# Patient Record
Sex: Female | Born: 1940 | Race: White | Hispanic: No | Marital: Single | Smoking: Never smoker
Health system: Southern US, Community
[De-identification: ages and names within clinical notes are randomized; demographics above are authoritative.]

## PROBLEM LIST (undated history)

## (undated) DIAGNOSIS — G629 Polyneuropathy, unspecified: Secondary | ICD-10-CM

## (undated) DIAGNOSIS — R112 Nausea with vomiting, unspecified: Secondary | ICD-10-CM

## (undated) DIAGNOSIS — J45909 Unspecified asthma, uncomplicated: Secondary | ICD-10-CM

## (undated) DIAGNOSIS — I82409 Acute embolism and thrombosis of unspecified deep veins of unspecified lower extremity: Secondary | ICD-10-CM

## (undated) DIAGNOSIS — G4730 Sleep apnea, unspecified: Secondary | ICD-10-CM

## (undated) DIAGNOSIS — I10 Essential (primary) hypertension: Secondary | ICD-10-CM

## (undated) DIAGNOSIS — Z8489 Family history of other specified conditions: Secondary | ICD-10-CM

## (undated) DIAGNOSIS — J4489 Other specified chronic obstructive pulmonary disease: Secondary | ICD-10-CM

## (undated) DIAGNOSIS — R42 Dizziness and giddiness: Secondary | ICD-10-CM

## (undated) DIAGNOSIS — I509 Heart failure, unspecified: Secondary | ICD-10-CM

## (undated) DIAGNOSIS — J449 Chronic obstructive pulmonary disease, unspecified: Secondary | ICD-10-CM

## (undated) DIAGNOSIS — K219 Gastro-esophageal reflux disease without esophagitis: Secondary | ICD-10-CM

## (undated) DIAGNOSIS — E079 Disorder of thyroid, unspecified: Secondary | ICD-10-CM

## (undated) DIAGNOSIS — E119 Type 2 diabetes mellitus without complications: Secondary | ICD-10-CM

## (undated) DIAGNOSIS — Z9889 Other specified postprocedural states: Secondary | ICD-10-CM

## (undated) HISTORY — PX: CARDIAC SURGERY: SHX584

## (undated) HISTORY — PX: VERTICAL BANDED GASTROPLASTY: SHX1102

## (undated) HISTORY — PX: HERNIA REPAIR: SHX51

---

## 2015-12-15 DIAGNOSIS — E039 Hypothyroidism, unspecified: Secondary | ICD-10-CM | POA: Insufficient documentation

## 2015-12-15 DIAGNOSIS — I10 Essential (primary) hypertension: Secondary | ICD-10-CM | POA: Insufficient documentation

## 2016-05-12 ENCOUNTER — Emergency Department: Payer: Medicare Other

## 2016-05-12 ENCOUNTER — Encounter: Payer: Self-pay | Admitting: Emergency Medicine

## 2016-05-12 ENCOUNTER — Inpatient Hospital Stay
Admission: EM | Admit: 2016-05-12 | Discharge: 2016-05-14 | DRG: 872 | Disposition: A | Discharge status: Home / Self Care | Payer: Medicare Other | Attending: Internal Medicine | Admitting: Internal Medicine

## 2016-05-12 DIAGNOSIS — Z952 Presence of prosthetic heart valve: Secondary | ICD-10-CM | POA: Diagnosis not present

## 2016-05-12 DIAGNOSIS — E1165 Type 2 diabetes mellitus with hyperglycemia: Secondary | ICD-10-CM | POA: Diagnosis not present

## 2016-05-12 DIAGNOSIS — M6281 Muscle weakness (generalized): Secondary | ICD-10-CM

## 2016-05-12 DIAGNOSIS — Z885 Allergy status to narcotic agent status: Secondary | ICD-10-CM | POA: Diagnosis not present

## 2016-05-12 DIAGNOSIS — J45901 Unspecified asthma with (acute) exacerbation: Secondary | ICD-10-CM | POA: Diagnosis present

## 2016-05-12 DIAGNOSIS — R079 Chest pain, unspecified: Secondary | ICD-10-CM | POA: Diagnosis not present

## 2016-05-12 DIAGNOSIS — A419 Sepsis, unspecified organism: Principal | ICD-10-CM | POA: Diagnosis present

## 2016-05-12 DIAGNOSIS — Z881 Allergy status to other antibiotic agents status: Secondary | ICD-10-CM

## 2016-05-12 DIAGNOSIS — J440 Chronic obstructive pulmonary disease with acute lower respiratory infection: Secondary | ICD-10-CM | POA: Diagnosis present

## 2016-05-12 DIAGNOSIS — T380X5A Adverse effect of glucocorticoids and synthetic analogues, initial encounter: Secondary | ICD-10-CM | POA: Diagnosis not present

## 2016-05-12 DIAGNOSIS — G4730 Sleep apnea, unspecified: Secondary | ICD-10-CM | POA: Diagnosis present

## 2016-05-12 DIAGNOSIS — Y92230 Patient room in hospital as the place of occurrence of the external cause: Secondary | ICD-10-CM | POA: Diagnosis not present

## 2016-05-12 DIAGNOSIS — E079 Disorder of thyroid, unspecified: Secondary | ICD-10-CM | POA: Diagnosis present

## 2016-05-12 DIAGNOSIS — J189 Pneumonia, unspecified organism: Secondary | ICD-10-CM

## 2016-05-12 DIAGNOSIS — E871 Hypo-osmolality and hyponatremia: Secondary | ICD-10-CM | POA: Diagnosis present

## 2016-05-12 DIAGNOSIS — Z882 Allergy status to sulfonamides status: Secondary | ICD-10-CM

## 2016-05-12 DIAGNOSIS — J441 Chronic obstructive pulmonary disease with (acute) exacerbation: Secondary | ICD-10-CM

## 2016-05-12 DIAGNOSIS — I10 Essential (primary) hypertension: Secondary | ICD-10-CM | POA: Diagnosis present

## 2016-05-12 DIAGNOSIS — E669 Obesity, unspecified: Secondary | ICD-10-CM | POA: Diagnosis present

## 2016-05-12 DIAGNOSIS — Z6835 Body mass index (BMI) 35.0-35.9, adult: Secondary | ICD-10-CM

## 2016-05-12 DIAGNOSIS — Z886 Allergy status to analgesic agent status: Secondary | ICD-10-CM | POA: Diagnosis not present

## 2016-05-12 DIAGNOSIS — D72829 Elevated white blood cell count, unspecified: Secondary | ICD-10-CM

## 2016-05-12 DIAGNOSIS — Z888 Allergy status to other drugs, medicaments and biological substances status: Secondary | ICD-10-CM | POA: Diagnosis not present

## 2016-05-12 DIAGNOSIS — E119 Type 2 diabetes mellitus without complications: Secondary | ICD-10-CM | POA: Diagnosis present

## 2016-05-12 DIAGNOSIS — R262 Difficulty in walking, not elsewhere classified: Secondary | ICD-10-CM

## 2016-05-12 DIAGNOSIS — R531 Weakness: Secondary | ICD-10-CM

## 2016-05-12 DIAGNOSIS — J209 Acute bronchitis, unspecified: Secondary | ICD-10-CM | POA: Diagnosis present

## 2016-05-12 HISTORY — DX: Type 2 diabetes mellitus without complications: E11.9

## 2016-05-12 HISTORY — DX: Disorder of thyroid, unspecified: E07.9

## 2016-05-12 HISTORY — DX: Essential (primary) hypertension: I10

## 2016-05-12 HISTORY — DX: Unspecified asthma, uncomplicated: J45.909

## 2016-05-12 LAB — COMPREHENSIVE METABOLIC PANEL
ALBUMIN: 4.2 g/dL (ref 3.5–5.0)
ALK PHOS: 84 U/L (ref 38–126)
ALT: 20 U/L (ref 14–54)
ANION GAP: 10 (ref 5–15)
AST: 34 U/L (ref 15–41)
BUN: 23 mg/dL — ABNORMAL HIGH (ref 6–20)
CO2: 25 mmol/L (ref 22–32)
Calcium: 9.2 mg/dL (ref 8.9–10.3)
Chloride: 97 mmol/L — ABNORMAL LOW (ref 101–111)
Creatinine, Ser: 0.92 mg/dL (ref 0.44–1.00)
GFR calc Af Amer: >60 mL/min (ref >60)
GFR calc non Af Amer: 60 mL/min — ABNORMAL LOW (ref >60)
GLUCOSE: 123 mg/dL — AB (ref 65–99)
POTASSIUM: 4 mmol/L (ref 3.5–5.1)
SODIUM: 132 mmol/L — AB (ref 135–145)
Total Bilirubin: 0.9 mg/dL (ref 0.3–1.2)
Total Protein: 7.4 g/dL (ref 6.5–8.1)

## 2016-05-12 LAB — CBC WITH DIFFERENTIAL/PLATELET
BASOS ABS: 0 10*3/uL (ref 0–0.1)
BASOS PCT: 0 %
EOS ABS: 0 10*3/uL (ref 0–0.7)
Eosinophils Relative: 0 %
HCT: 41.4 % (ref 35.0–47.0)
HEMOGLOBIN: 14.2 g/dL (ref 12.0–16.0)
Lymphocytes Relative: 8 %
Lymphs Abs: 1.5 10*3/uL (ref 1.0–3.6)
MCH: 30.4 pg (ref 26.0–34.0)
MCHC: 34.4 g/dL (ref 32.0–36.0)
MCV: 88.5 fL (ref 80.0–100.0)
MONOS PCT: 10 %
Monocytes Absolute: 1.7 10*3/uL — ABNORMAL HIGH (ref 0.2–0.9)
NEUTROS PCT: 82 %
Neutro Abs: 15 10*3/uL — ABNORMAL HIGH (ref 1.4–6.5)
Platelets: 191 10*3/uL (ref 150–440)
RBC: 4.68 MIL/uL (ref 3.80–5.20)
RDW: 13.7 % (ref 11.5–14.5)
WBC: 18.3 10*3/uL — ABNORMAL HIGH (ref 3.6–11.0)

## 2016-05-12 LAB — GLUCOSE, CAPILLARY: GLUCOSE-CAPILLARY: 302 mg/dL — AB (ref 65–99)

## 2016-05-12 LAB — TROPONIN I: Troponin I: <0.03 ng/mL (ref <0.03)

## 2016-05-12 LAB — PROTIME-INR
INR: 2.72
Prothrombin Time: 29.4 seconds — ABNORMAL HIGH (ref 11.4–15.2)

## 2016-05-12 LAB — BRAIN NATRIURETIC PEPTIDE: B Natriuretic Peptide: 113 pg/mL — ABNORMAL HIGH (ref 0.0–100.0)

## 2016-05-12 MED ORDER — INSULIN GLARGINE 100 UNIT/ML ~~LOC~~ SOLN
10.0000 [IU] | Freq: Every day | SUBCUTANEOUS | Status: DC
  Filled 2016-05-12: qty 0.1

## 2016-05-12 MED ORDER — INSULIN ASPART 100 UNIT/ML ~~LOC~~ SOLN
0.0000 [IU] | Freq: Three times a day (TID) | SUBCUTANEOUS | Status: DC
  Administered 2016-05-13: 11:00:00 7 [IU] via SUBCUTANEOUS
  Administered 2016-05-13: 17:00:00 3 [IU] via SUBCUTANEOUS
  Administered 2016-05-14: 4 [IU] via SUBCUTANEOUS
  Administered 2016-05-14: 11 [IU] via SUBCUTANEOUS
  Filled 2016-05-12: qty 11
  Filled 2016-05-12: qty 7
  Filled 2016-05-12: qty 3
  Filled 2016-05-12: qty 4

## 2016-05-12 MED ORDER — INSULIN ASPART 100 UNIT/ML ~~LOC~~ SOLN
4.0000 [IU] | Freq: Three times a day (TID) | SUBCUTANEOUS | Status: DC
  Administered 2016-05-13 – 2016-05-14 (×4): 4 [IU] via SUBCUTANEOUS
  Filled 2016-05-12 (×4): qty 4

## 2016-05-12 MED ORDER — SODIUM CHLORIDE 0.9% FLUSH
3.0000 mL | Freq: Two times a day (BID) | INTRAVENOUS | Status: DC
  Administered 2016-05-13 – 2016-05-14 (×3): 3 mL via INTRAVENOUS

## 2016-05-12 MED ORDER — FESOTERODINE FUMARATE ER 4 MG PO TB24
4.0000 mg | ORAL_TABLET | Freq: Every day | ORAL | Status: DC
  Administered 2016-05-13 – 2016-05-14 (×2): 4 mg via ORAL
  Filled 2016-05-12 (×2): qty 1

## 2016-05-12 MED ORDER — LEVOTHYROXINE SODIUM 25 MCG PO TABS
125.0000 ug | ORAL_TABLET | Freq: Every day | ORAL | Status: DC
  Administered 2016-05-13 – 2016-05-14 (×2): 125 ug via ORAL
  Filled 2016-05-12 (×2): qty 1

## 2016-05-12 MED ORDER — INSULIN GLARGINE 100 UNIT/ML ~~LOC~~ SOLN: 8.0000 [IU] | Freq: Every day | SUBCUTANEOUS | Status: DC

## 2016-05-12 MED ORDER — SODIUM CHLORIDE 0.9 % IV SOLN: 250.0000 mL | INTRAVENOUS | Status: DC | PRN

## 2016-05-12 MED ORDER — BUMETANIDE 0.5 MG PO TABS: 0.5000 mg | ORAL_TABLET | Freq: Every day | ORAL | Status: DC

## 2016-05-12 MED ORDER — PANTOPRAZOLE SODIUM 40 MG PO TBEC
40.0000 mg | DELAYED_RELEASE_TABLET | Freq: Every day | ORAL | Status: DC
  Administered 2016-05-13 – 2016-05-14 (×2): 40 mg via ORAL
  Filled 2016-05-12 (×2): qty 1

## 2016-05-12 MED ORDER — INSULIN GLARGINE 100 UNIT/ML ~~LOC~~ SOLN
20.0000 [IU] | Freq: Every morning | SUBCUTANEOUS | Status: DC
  Administered 2016-05-13 – 2016-05-14 (×2): 20 [IU] via SUBCUTANEOUS
  Filled 2016-05-12 (×2): qty 0.2

## 2016-05-12 MED ORDER — ENOXAPARIN SODIUM 40 MG/0.4ML ~~LOC~~ SOLN
40.0000 mg | SUBCUTANEOUS | Status: DC
  Administered 2016-05-12: 23:00:00 40 mg via SUBCUTANEOUS
  Filled 2016-05-12: qty 0.4

## 2016-05-12 MED ORDER — INSULIN GLARGINE 100 UNIT/ML ~~LOC~~ SOLN
10.0000 [IU] | Freq: Every day | SUBCUTANEOUS | Status: DC
  Administered 2016-05-12 – 2016-05-13 (×2): 10 [IU] via SUBCUTANEOUS
  Filled 2016-05-12 (×2): qty 0.1

## 2016-05-12 MED ORDER — INSULIN ASPART 100 UNIT/ML ~~LOC~~ SOLN
0.0000 [IU] | Freq: Every day | SUBCUTANEOUS | Status: DC
  Administered 2016-05-12: 23:00:00 4 [IU] via SUBCUTANEOUS
  Filled 2016-05-12: qty 4

## 2016-05-12 MED ORDER — ACETAMINOPHEN 325 MG PO TABS: 650.0000 mg | ORAL_TABLET | Freq: Four times a day (QID) | ORAL | Status: DC | PRN

## 2016-05-12 MED ORDER — OCUVITE-LUTEIN PO CAPS
1.0000 | ORAL_CAPSULE | Freq: Two times a day (BID) | ORAL | Status: DC
  Administered 2016-05-12 – 2016-05-14 (×4): 1 via ORAL
  Filled 2016-05-12 (×4): qty 1

## 2016-05-12 MED ORDER — TIOTROPIUM BROMIDE MONOHYDRATE 18 MCG IN CAPS
18.0000 ug | ORAL_CAPSULE | Freq: Every day | RESPIRATORY_TRACT | Status: DC
  Administered 2016-05-13 – 2016-05-14 (×2): 18 ug via RESPIRATORY_TRACT
  Filled 2016-05-12: qty 30

## 2016-05-12 MED ORDER — SODIUM CHLORIDE 0.9 % IV SOLN
INTRAVENOUS | Status: DC
  Administered 2016-05-12: 19:00:00 via INTRAVENOUS

## 2016-05-12 MED ORDER — METHYLPREDNISOLONE SODIUM SUCC 125 MG IJ SOLR
60.0000 mg | INTRAMUSCULAR | Status: DC
  Administered 2016-05-13: 22:00:00 60 mg via INTRAVENOUS
  Filled 2016-05-12 (×2): qty 2

## 2016-05-12 MED ORDER — SODIUM CHLORIDE 0.9% FLUSH: 3.0000 mL | INTRAVENOUS | Status: DC | PRN

## 2016-05-12 MED ORDER — WARFARIN SODIUM 5 MG PO TABS: 5.0000 mg | ORAL_TABLET | Freq: Every day | ORAL | Status: DC

## 2016-05-12 MED ORDER — ONDANSETRON HCL 4 MG PO TABS: 4.0000 mg | ORAL_TABLET | Freq: Four times a day (QID) | ORAL | Status: DC | PRN

## 2016-05-12 MED ORDER — ASPIRIN EC 81 MG PO TBEC
81.0000 mg | DELAYED_RELEASE_TABLET | Freq: Every day | ORAL | Status: DC
  Administered 2016-05-13: 81 mg via ORAL
  Filled 2016-05-12 (×3): qty 1

## 2016-05-12 MED ORDER — METHYLPREDNISOLONE SODIUM SUCC 125 MG IJ SOLR
125.0000 mg | Freq: Once | INTRAMUSCULAR | Status: AC
  Administered 2016-05-12: 125 mg via INTRAVENOUS

## 2016-05-12 MED ORDER — IPRATROPIUM-ALBUTEROL 0.5-2.5 (3) MG/3ML IN SOLN
3.0000 mL | Freq: Once | RESPIRATORY_TRACT | Status: AC
  Administered 2016-05-12: 3 mL via RESPIRATORY_TRACT

## 2016-05-12 MED ORDER — INSULIN GLARGINE 100 UNIT/ML ~~LOC~~ SOLN
8.0000 [IU] | Freq: Every day | SUBCUTANEOUS | Status: DC
  Filled 2016-05-12: qty 0.08

## 2016-05-12 MED ORDER — METOPROLOL SUCCINATE ER 25 MG PO TB24
50.0000 mg | ORAL_TABLET | Freq: Every day | ORAL | Status: DC
  Administered 2016-05-13 – 2016-05-14 (×2): 50 mg via ORAL
  Filled 2016-05-12 (×2): qty 2

## 2016-05-12 MED ORDER — LEVOFLOXACIN IN D5W 750 MG/150ML IV SOLN
750.0000 mg | Freq: Once | INTRAVENOUS | Status: AC
  Administered 2016-05-12: 750 mg via INTRAVENOUS
  Filled 2016-05-12 (×2): qty 150

## 2016-05-12 MED ORDER — ONDANSETRON HCL 4 MG/2ML IJ SOLN
4.0000 mg | Freq: Four times a day (QID) | INTRAMUSCULAR | Status: DC | PRN
Start: 2016-05-12 — End: 2016-05-14

## 2016-05-12 MED ORDER — CHLORHEXIDINE GLUCONATE 0.12 % MT SOLN
15.0000 mL | Freq: Two times a day (BID) | OROMUCOSAL | Status: DC
  Administered 2016-05-12 – 2016-05-14 (×3): 15 mL via OROMUCOSAL
  Filled 2016-05-12 (×4): qty 15

## 2016-05-12 MED ORDER — GUAIFENESIN ER 600 MG PO TB12
600.0000 mg | ORAL_TABLET | Freq: Two times a day (BID) | ORAL | Status: DC
  Administered 2016-05-12 – 2016-05-14 (×4): 600 mg via ORAL
  Filled 2016-05-12 (×4): qty 1

## 2016-05-12 MED ORDER — LEVALBUTEROL HCL 0.63 MG/3ML IN NEBU
0.6300 mg | INHALATION_SOLUTION | Freq: Four times a day (QID) | RESPIRATORY_TRACT | Status: DC
  Administered 2016-05-13 – 2016-05-14 (×5): 0.63 mg via RESPIRATORY_TRACT
  Filled 2016-05-12 (×5): qty 3

## 2016-05-12 MED ORDER — BUMETANIDE 1 MG PO TABS: 1.0000 mg | ORAL_TABLET | Freq: Every day | ORAL | Status: DC

## 2016-05-12 MED ORDER — IPRATROPIUM-ALBUTEROL 0.5-2.5 (3) MG/3ML IN SOLN: 3.0000 mL | RESPIRATORY_TRACT | Status: DC

## 2016-05-12 MED ORDER — ROPINIROLE HCL 0.25 MG PO TABS
0.5000 mg | ORAL_TABLET | Freq: Every evening | ORAL | Status: DC | PRN
  Administered 2016-05-13 (×2): 0.5 mg via ORAL
  Filled 2016-05-12 (×2): qty 2

## 2016-05-12 MED ORDER — METHYLPREDNISOLONE SODIUM SUCC 125 MG IJ SOLR
INTRAMUSCULAR | Status: AC
  Administered 2016-05-12: 125 mg via INTRAVENOUS
  Filled 2016-05-12: qty 2

## 2016-05-12 MED ORDER — ACETAMINOPHEN 650 MG RE SUPP: 650.0000 mg | Freq: Four times a day (QID) | RECTAL | Status: DC | PRN

## 2016-05-12 MED ORDER — IPRATROPIUM BROMIDE 0.02 % IN SOLN
RESPIRATORY_TRACT | Status: DC
Start: 2016-05-12 — End: 2016-05-12
  Filled 2016-05-12: qty 5

## 2016-05-12 MED ORDER — DIPHENHYDRAMINE HCL 50 MG/ML IJ SOLN
25.0000 mg | Freq: Once | INTRAMUSCULAR | Status: AC
  Administered 2016-05-12: 25 mg via INTRAVENOUS

## 2016-05-12 MED ORDER — LEVOFLOXACIN IN D5W 750 MG/150ML IV SOLN
750.0000 mg | INTRAVENOUS | Status: DC
  Filled 2016-05-12: qty 150

## 2016-05-12 MED ORDER — DIPHENHYDRAMINE HCL 50 MG/ML IJ SOLN
INTRAMUSCULAR | Status: AC
  Administered 2016-05-12: 25 mg via INTRAVENOUS
  Filled 2016-05-12: qty 1

## 2016-05-12 MED ORDER — IPRATROPIUM-ALBUTEROL 0.5-2.5 (3) MG/3ML IN SOLN
RESPIRATORY_TRACT | Status: AC
  Administered 2016-05-12: 3 mL via RESPIRATORY_TRACT
  Filled 2016-05-12: qty 6

## 2016-05-12 NOTE — ED Notes (Signed)
Patient daughter Wells Guiles) and son-in-law Lennette Bihari) cell phone number: [phone]

## 2016-05-12 NOTE — H&P (Addendum)
[facility] NAME: Gina Chambers    MR#:  [id number]  DATE OF BIRTH:  November 06, 1940  DATE OF ADMISSION:  05/12/2016  PRIMARY CARE PHYSICIAN: Kirk Ruths., MD   REQUESTING/REFERRING PHYSICIAN:   CHIEF COMPLAINT:   Chief Complaint  Patient presents with  . Cough    HISTORY OF PRESENT ILLNESS: Gina Chambers  is a 76 y.o. female with a known history of Asthma, diabetes mellitus, obesity, sleep apnea, hypertension, thyroid disease, who presents to the hospital with complaints of one half week history of cough with dark green phlegm, some blood in the sputum, shortness of breath, wheezing. She also admits of weakness. On arrival to emergency room, she was noted to have hyponatremia, elevated white blood cell count 18,000, and hospitalist services were contacted for admission  PAST MEDICAL HISTORY:   Past Medical History:  Diagnosis Date  . Asthma   . Diabetes mellitus without complication ([place])   . Hypertension   . Thyroid disease     PAST SURGICAL HISTORY: Past Surgical History:  Procedure Laterality Date  . CARDIAC SURGERY    . HERNIA REPAIR    . VERTICAL BANDED GASTROPLASTY      SOCIAL HISTORY:  Social History  Substance Use Topics  . Smoking status: Never Smoker  . Smokeless tobacco: Never Used  . Alcohol use No    FAMILY HISTORY: No early coronary artery disease  DRUG ALLERGIES:  Allergies  Allergen Reactions  . Bupropion     GI issues   . Calcium     Chest pain   . Cefuroxime     Swelling   . Cephalexin   . Clinoril [Sulindac]     GI  . Codeine Hives  . Duloxetine     Bleeding   . Ezetimibe     Joint pain   . Fenofibrate     Leg cramps  . Furosemide     Fluid retention   . Glipizide     Bloating   . Lorazepam     SI  . Lortab [Hydrocodone-Acetaminophen] Hives  . Metaproterenol     Palpitations   . Morphine And Related   . Nalbuphine     Rapid heart rate  flushing   .  Naproxen     Numbness   . Norfloxacin     Urinary retention   . Rofecoxib   . Sodium   . Statins     Muscle pain   . Sulfa Antibiotics     Unknown   . Tramadol   . Trazodone And Nefazodone   . Doxycycline Rash  . Iodine Rash  . Ketoprofen Rash  . Piroxicam Rash  . Tolmetin Rash    Review of Systems  Constitutional: Positive for chills, fever and malaise/fatigue. Negative for weight loss.  HENT: Negative for congestion.   Eyes: Negative for blurred vision and double vision.  Respiratory: Positive for cough, hemoptysis, sputum production, shortness of breath and wheezing.   Cardiovascular: Positive for chest pain and leg swelling. Negative for palpitations, orthopnea and PND.  Gastrointestinal: Negative for abdominal pain, blood in stool, constipation, diarrhea, nausea and vomiting.  Genitourinary: Positive for frequency. Negative for dysuria, hematuria and urgency.  Musculoskeletal: Positive for myalgias. Negative for falls.  Neurological: Negative for dizziness, tremors, focal weakness and headaches.  Endo/Heme/Allergies: Does not bruise/bleed easily.  Psychiatric/Behavioral: Negative for depression. The patient does not have insomnia.     MEDICATIONS AT HOME:  Prior to  Admission medications   Not on File      PHYSICAL EXAMINATION:   VITAL SIGNS: Blood pressure (!) 103/52, pulse 95, temperature 98.2 F (36.8 C), resp. rate 20, SpO2 96 %.  GENERAL:  76 y.o.-year-old patient lying in the bed in moderate to severe respiratory distress, coughing, uncomfortable, tachypneic.  EYES: Pupils equal, round, reactive to light and accommodation. No scleral icterus. Extraocular muscles intact.  HEENT: Head atraumatic, normocephalic. Oropharynx and nasopharynx clear.  NECK:  Supple, no jugular venous distention. No thyroid enlargement, no tenderness.  LUNGS: Normal breath sounds bilaterally, no wheezing, but bilateral inspiratory as well as expiratory rales,rhonchi and  crepitations noted. Using  accessory muscles of respiration.  CARDIOVASCULAR: S1, S2 normal. 3/6 systolic murmur in mitral auscultation site with some radiation to left axilla, no rubs, or gallops.  ABDOMEN: Soft, nontender, nondistended. Bowel sounds present. No organomegaly or mass.  EXTREMITIES: Trace lower extremity and pedal edema, no cyanosis, or clubbing.  NEUROLOGIC: Cranial nerves II through XII are intact. Muscle strength 5/5 in all extremities. Sensation intact. Gait not checked.  PSYCHIATRIC: The patient is alert and oriented x 3.  SKIN: No obvious rash, lesion, or ulcer.   LABORATORY PANEL:   CBC  Recent Labs Lab 05/12/16 1555  WBC 18.3*  HGB 14.2  HCT 41.4  PLT 191  MCV 88.5  MCH 30.4  MCHC 34.4  RDW 13.7  LYMPHSABS 1.5  MONOABS 1.7*  EOSABS 0.0  BASOSABS 0.0   ------------------------------------------------------------------------------------------------------------------  Chemistries   Recent Labs Lab 05/12/16 1555  NA 132*  K 4.0  CL 97*  CO2 25  GLUCOSE 123*  BUN 23*  CREATININE 0.92  CALCIUM 9.2  AST 34  ALT 20  ALKPHOS 84  BILITOT 0.9   ------------------------------------------------------------------------------------------------------------------  Cardiac Enzymes  Recent Labs Lab 05/12/16 1555  TROPONINI <0.03   ------------------------------------------------------------------------------------------------------------------  RADIOLOGY: Dg Chest 2 View  Result Date: 05/12/2016 CLINICAL DATA:  Cough and short of breath EXAM: CHEST  2 VIEW COMPARISON:  None. FINDINGS: The heart is upper normal in size. Lungs are under aerated with basilar subsegmental atelectasis. 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 Normal vascularity. No pneumothorax or pleural effusion. IMPRESSION: Low lung volumes and bibasilar atelectasis. Electronically Signed   By: Marybelle Killings M.D.   On: 05/12/2016 15:09    EKG: Orders placed or  performed during the hospital encounter of 05/12/16  . ED EKG  . ED EKG    IMPRESSION AND PLAN:  Active Problems:   COPD exacerbation ([place])   Community acquired pneumonia   Leukocytosis   Generalized weakness  #1. Sepsis due to pneumonia, admit the  patient to the medical floor, initiate antibiotic therapy, follow white blood cell count, culture results #2. Community-acquired pneumonia, continue levofloxacin, get sputum cultures #3. COPD exacerbation, continue patient on steroids, inhalation therapy, follow clinically, get BNP, echocardiogram #4. Leukocytosis, follow with antibiotic therapy #5. Generalized weakness, get physical therapist involved for recommendations   All the records are reviewed and case discussed with ED provider. Management plans discussed with the patient, family and they are in agreement.  CODE STATUS: Code Status History    This patient does not have a recorded code status. Please follow your organizational policy for patients in this situation.    Advance Directive Documentation   Flowsheet Row Most Recent Value  Type of Advance Directive  Healthcare Power of Attorney, Living will  Pre-existing out of facility DNR order (yellow form or pink MOST form)  No data  "  MOST" Form in Place?  No data       TOTAL TIME TAKING CARE OF THIS PATIENT: 50 minutes.    Theodoro Grist M.D on 05/12/2016 at 6:21 PM  Between 7am to 6pm - Pager - [phone] After 6pm go to [email or web address] - password [email or web address] [facility]  Office  [phone]  CC: Primary care physician; Kirk Ruths., MD

## 2016-05-12 NOTE — ED Provider Notes (Signed)
[facility] Emergency Department Provider Note   ____________________________________________    I have reviewed the triage vital signs and the nursing notes.   HISTORY  Chief Complaint Cough     HPI Gina Chambers is a 76 y.o. female who presents with cough, shortness of breath and diffuse weakness. Patient reports over the last 2 weeks she has had a cough that has left her "winded". She also complains that she has no energy and is unable to get around. Typically she is able to ambulate easily and drives a car without difficulty. She has had chills, no recent travel. She reports a history of COPD.   Past Medical History:  Diagnosis Date  . Asthma   . Diabetes mellitus without complication ([place])   . Hypertension   . Thyroid disease     There are no active problems to display for this patient.   Past Surgical History:  Procedure Laterality Date  . CARDIAC SURGERY    . HERNIA REPAIR    . VERTICAL BANDED GASTROPLASTY      Prior to Admission medications   Not on File     Allergies Bupropion; Calcium; Cefuroxime; Cephalexin; Clinoril [sulindac]; Codeine; Duloxetine; Ezetimibe; Fenofibrate; Furosemide; Glipizide; Lorazepam; Lortab [hydrocodone-acetaminophen]; Metaproterenol; Morphine and related; Nalbuphine; Naproxen; Norfloxacin; Rofecoxib; Sodium; Statins; Sulfa antibiotics; Tramadol; Trazodone and nefazodone; Doxycycline; Iodine; Ketoprofen; Piroxicam; and Tolmetin  No family history on file.  Social History Social History  Substance Use Topics  . Smoking status: Never Smoker  . Smokeless tobacco: Never Used  . Alcohol use No    Review of Systems  Constitutional: Intervention chills Eyes: No visual changes.  ENT: No sore throat. Cardiovascular: Chest pain when coughing Respiratory: Shortness of breath Gastrointestinal: No abdominal pain.  No nausea, no vomiting.   Genitourinary: Negative for dysuria. Musculoskeletal: Negative  for back pain. Skin: Negative for rash. Neurological: Negative for headaches   10-point ROS otherwise negative.  ____________________________________________   PHYSICAL EXAM:  VITAL SIGNS: ED Triage Vitals  Enc Vitals Group     BP 05/12/16 1417 (!) 103/52     Pulse Rate 05/12/16 1417 88     Resp 05/12/16 1417 20     Temp 05/12/16 1417 98.2 F (36.8 C)     Temp src --      SpO2 05/12/16 1417 100 %     Weight --      Height --      Head Circumference --      Peak Flow --      Pain Score 05/12/16 1419 4     Pain Loc --      Pain Edu? --      Excl. in [place]? --     Constitutional: Alert and oriented. No acute distress. Pleasant and interactive Eyes: Conjunctivae are normal.   Nose: No congestion/rhinnorhea. Mouth/Throat: Mucous membranes are moist.    Cardiovascular: Normal rate, regular rhythm. Grossly normal heart sounds.  Good peripheral circulation. Respiratory: Increased respiratory effort with tachypnea  No retractions. Wheezes bilaterally, crackles bibasilarly. Gastrointestinal: Soft and nontender. No distention.  No CVA tenderness. Genitourinary: deferred Musculoskeletal:  Warm and well perfused Neurologic:  Normal speech and language. No gross focal neurologic deficits are appreciated.  Skin:  Skin is warm, dry and intact. No rash noted. Psychiatric: Mood and affect are normal. Speech and behavior are normal.  ____________________________________________   LABS (all labs ordered are listed, but only abnormal results are displayed)  Labs Reviewed  CBC WITH DIFFERENTIAL/PLATELET -  Abnormal; Notable for the following:       Result Value   WBC 18.3 (*)    Neutro Abs 15.0 (*)    Monocytes Absolute 1.7 (*)    All other components within normal limits  COMPREHENSIVE METABOLIC PANEL - Abnormal; Notable for the following:    Sodium 132 (*)    Chloride 97 (*)    Glucose, Bld 123 (*)    BUN 23 (*)    GFR calc non Af Amer 60 (*)    All other components within  normal limits  CULTURE, BLOOD (ROUTINE X 2)  CULTURE, BLOOD (ROUTINE X 2)  TROPONIN I   ____________________________________________  EKG  ED ECG REPORT I, Lavonia Drafts, the attending physician, personally viewed and interpreted this ECG.  Date: 05/12/2016  Rate: 90 Rhythm: normal sinus rhythm QRS Axis: normal Intervals: normal ST/T Wave abnormalities: normal Conduction Disturbances: none Narrative Interpretation: unremarkable  ____________________________________________  RADIOLOGY  Chest x-ray bibasilar atelectasis ____________________________________________   PROCEDURES  Procedure(s) performed: No    Critical Care performed: No ____________________________________________   INITIAL IMPRESSION / ASSESSMENT AND PLAN / ED COURSE  Pertinent labs & imaging results that were available during my care of the patient were reviewed by me and considered in my medical decision making (see chart for details).  Patient with a history of diabetes presents with cough and shortness of breath. She is wheezing diffusely. Her chest x-ray was reassuring however she does have an elevated white blood cell count and severe weakness, she was unable to get out of wheelchair on her own. I suspect pneumonia coupled with COPD exacerbation. We will treat with steroids, DuoNeb and antibiotics and admitted to the hospital.    ____________________________________________   FINAL CLINICAL IMPRESSION(S) / ED DIAGNOSES  Final diagnoses:  COPD exacerbation ([place])  Community acquired pneumonia, unspecified laterality      NEW MEDICATIONS STARTED DURING THIS VISIT:  New Prescriptions   No medications on file     Note:  This document was prepared using Dragon voice recognition software and may include unintentional dictation errors.    Lavonia Drafts, MD 05/12/16 [phone]

## 2016-05-12 NOTE — ED Notes (Signed)
RN in room to give patient water, RN noted that arm that levaquin was infusing in was red at IV insertion site and patient reports itching, patient denies pain at IV site. Levaquin was stopped, Dr. Corky Downs notified of reaction and verbal order for Benadryl 25 mg IVP given.

## 2016-05-12 NOTE — ED Triage Notes (Signed)
Patient to ED via POV for cough. Patient states that she has coughing spells that make it hard for her to catch her breath. Patient states that she thinks that she had the flu about 1 week ago and that is when the cough started. Patient states that last week she was running fever and having chills, unsure how high fever was because it was not checked.

## 2016-05-13 ENCOUNTER — Inpatient Hospital Stay (HOSPITAL_COMMUNITY)
Admit: 2016-05-13 | Discharge: 2016-05-13 | Disposition: A | Discharge status: Home / Self Care | Payer: Medicare Other | Attending: Internal Medicine | Admitting: Internal Medicine

## 2016-05-13 DIAGNOSIS — R079 Chest pain, unspecified: Secondary | ICD-10-CM

## 2016-05-13 LAB — BASIC METABOLIC PANEL
ANION GAP: 11 (ref 5–15)
BUN: 26 mg/dL — ABNORMAL HIGH (ref 6–20)
CALCIUM: 9 mg/dL (ref 8.9–10.3)
CO2: 25 mmol/L (ref 22–32)
CREATININE: 0.87 mg/dL (ref 0.44–1.00)
Chloride: 95 mmol/L — ABNORMAL LOW (ref 101–111)
Glucose, Bld: 255 mg/dL — ABNORMAL HIGH (ref 65–99)
Potassium: 4.2 mmol/L (ref 3.5–5.1)
Sodium: 131 mmol/L — ABNORMAL LOW (ref 135–145)

## 2016-05-13 LAB — URINALYSIS, ROUTINE W REFLEX MICROSCOPIC
Bilirubin Urine: NEGATIVE
Glucose, UA: >=500 mg/dL — AB
Hgb urine dipstick: NEGATIVE
KETONES UR: 5 mg/dL — AB
Nitrite: NEGATIVE
PH: 5 (ref 5.0–8.0)
Protein, ur: NEGATIVE mg/dL
Specific Gravity, Urine: 1.014 (ref 1.005–1.030)

## 2016-05-13 LAB — GLUCOSE, CAPILLARY
GLUCOSE-CAPILLARY: 242 mg/dL — AB (ref 65–99)
GLUCOSE-CAPILLARY: 140 mg/dL — AB (ref 65–99)
Glucose-Capillary: 133 mg/dL — ABNORMAL HIGH (ref 65–99)
Glucose-Capillary: 205 mg/dL — ABNORMAL HIGH (ref 65–99)
Glucose-Capillary: 344 mg/dL — ABNORMAL HIGH (ref 65–99)

## 2016-05-13 LAB — CBC
HCT: 35.2 % (ref 35.0–47.0)
Hemoglobin: 12.3 g/dL (ref 12.0–16.0)
MCH: 31 pg (ref 26.0–34.0)
MCHC: 35.1 g/dL (ref 32.0–36.0)
MCV: 88.3 fL (ref 80.0–100.0)
Platelets: 165 K/uL (ref 150–440)
RBC: 3.98 MIL/uL (ref 3.80–5.20)
RDW: 13.6 % (ref 11.5–14.5)
WBC: 18.6 K/uL — ABNORMAL HIGH (ref 3.6–11.0)

## 2016-05-13 LAB — ECHOCARDIOGRAM COMPLETE
Height: 63 in
WEIGHTICAEL: 3172.8 [oz_av]

## 2016-05-13 MED ORDER — WARFARIN SODIUM 3 MG PO TABS: 7.5000 mg | ORAL_TABLET | ORAL | Status: DC

## 2016-05-13 MED ORDER — WARFARIN - PHYSICIAN DOSING INPATIENT
Freq: Every day | Status: DC
  Administered 2016-05-13: 17:00:00

## 2016-05-13 MED ORDER — BUMETANIDE 1 MG PO TABS
0.5000 mg | ORAL_TABLET | Freq: Every day | ORAL | Status: DC
  Administered 2016-05-13 (×2): 0.5 mg via ORAL
  Filled 2016-05-13 (×4): qty 1

## 2016-05-13 MED ORDER — BUMETANIDE 2 MG PO TABS
2.0000 mg | ORAL_TABLET | Freq: Every day | ORAL | Status: DC
  Administered 2016-05-13 – 2016-05-14 (×2): 2 mg via ORAL
  Filled 2016-05-13 (×2): qty 1

## 2016-05-13 MED ORDER — LEVOFLOXACIN 500 MG PO TABS
500.0000 mg | ORAL_TABLET | Freq: Every day | ORAL | Status: DC
  Administered 2016-05-13: 500 mg via ORAL
  Filled 2016-05-13: qty 1

## 2016-05-13 MED ORDER — WARFARIN SODIUM 5 MG PO TABS
5.0000 mg | ORAL_TABLET | Freq: Once | ORAL | Status: AC
  Administered 2016-05-13: 01:00:00 5 mg via ORAL
  Filled 2016-05-13: qty 1

## 2016-05-13 MED ORDER — WARFARIN SODIUM 4 MG PO TABS
5.0000 mg | ORAL_TABLET | ORAL | Status: DC
  Administered 2016-05-13: 17:00:00 5 mg via ORAL
  Filled 2016-05-13: qty 1

## 2016-05-13 MED ORDER — WARFARIN SODIUM 4 MG PO TABS: 5.0000 mg | ORAL_TABLET | Freq: Once | ORAL | Status: DC

## 2016-05-13 NOTE — Evaluation (Signed)
Physical Therapy Evaluation Patient Details Name: Gina Chambers MRN: [id number] DOB: Jun 30, 1940 Today's Date: 05/13/2016   History of Present Illness  76 y.o. female with a known history of Asthma, diabetes mellitus, obesity, sleep apnea, hypertension, thyroid disease, who presents to the hospital with complaints of one half week history of cough with dark green phlegm, some blood in the sputum, shortness of breath, wheezing. She also admits of weakness. On arrival to emergency room, she was noted to have hyponatremia, elevated white blood cell count.  She reports feeling somewhat better but still relatively weak.  Clinical Impression  Pt was able to ambulate around the nurses' station and though she had no LOBs she was fatigued and generally guarded t/o the effort.  She did not need an AD and had no overt safety issues but was not overly confident and not at her baseline - she feels she will be able to return home with the family assist that she has w/o PT, but agrees that she needs to stay active here in the hospital until she leave to build up her activity tolerance and confidence.     Follow Up Recommendations No PT follow up (will keep on caseload in hospital to work back to baseline)    Equipment Recommendations       Recommendations for Other Services       Precautions / Restrictions Restrictions Weight Bearing Restrictions: No      Mobility  Bed Mobility Overal bed mobility: Independent                Transfers Overall transfer level: Independent               General transfer comment: Pt able to rise from sitting and get back to recliner multiple times t/o session w/o difficulty  Ambulation/Gait Ambulation/Gait assistance: Modified independent (Device/Increase time) Ambulation Distance (Feet): 200 Feet Assistive device: None       General Gait Details: Pt with slow but consistent ambulation that she states is not quite at her baseline.  She did not have  any LOBs or overt safety concerns but was quickly fatigued with the effort and HR ultimately got to 130s (O2 down to mid 90s) with the effort.    Stairs            Wheelchair Mobility    Modified Rankin (Stroke Patients Only)       Balance Overall balance assessment: Modified Independent                                           Pertinent Vitals/Pain Pain Assessment:  (chronic neuropathy and back pain, mild acute pain with cough)    Home Living Family/patient expects to be discharged to:: Private residence Living Arrangements: Children Available Help at Discharge: Family   Home Access:  (1 small step in from garage)     Home Layout: Able to live on main level with bedroom/bathroom Home Equipment: None      Prior Function Level of Independence: Independent         Comments: Pt reports she is normally able to active and do what she needs w/o issue - out regularly     Hand Dominance        Extremity/Trunk Assessment   Upper Extremity Assessment Upper Extremity Assessment: Overall WFL for tasks assessed    Lower Extremity Assessment Lower Extremity Assessment:  Overall WFL for tasks assessed       Communication   Communication: No difficulties  Cognition Arousal/Alertness: Awake/alert Behavior During Therapy: WFL for tasks assessed/performed Overall Cognitive Status: Within Functional Limits for tasks assessed                      General Comments      Exercises     Assessment/Plan    PT Assessment Patient needs continued PT services  PT Problem List Decreased strength;Decreased activity tolerance;Decreased balance;Decreased safety awareness;Cardiopulmonary status limiting activity;Pain;Decreased mobility          PT Treatment Interventions Gait training;Functional mobility training;Therapeutic activities;Therapeutic exercise;Balance training;Neuromuscular re-education;Patient/family education    PT Goals (Current  goals can be found in the Care Plan section)  Acute Rehab PT Goals Patient Stated Goal: go home PT Goal Formulation: With patient Time For Goal Achievement: 05/27/16 Potential to Achieve Goals: Fair    Frequency Min 2X/week   Barriers to discharge        Co-evaluation               End of Session Equipment Utilized During Treatment: Gait belt Activity Tolerance: Patient limited by fatigue Patient left: in chair;with call bell/phone within reach Nurse Communication: Mobility status         Time: [id number] PT Time Calculation (min) (ACUTE ONLY): 24 min   Charges:   PT Evaluation $PT Eval Low Complexity: 1 Procedure     PT G CodesKreg Shropshire, DPT 05/13/2016, 1:46 PM

## 2016-05-13 NOTE — Progress Notes (Signed)
PT Cancellation Note  Patient Details Name: Gina Chambers MRN: [id number] DOB: 01/03/1941   Cancelled Treatment:    Reason Eval/Treat Not Completed: Patient at procedure or test/unavailable Pt out of room, will try back later as time allows.   Kreg Shropshire, DPT 05/13/2016, 10:17 AM

## 2016-05-13 NOTE — Progress Notes (Addendum)
[facility] [place] NAME: Gina Chambers    MR#:  [id number]  DATE OF BIRTH:  1941/03/28  SUBJECTIVE:  Came in with a dry hacking cough and shortness of breath with wheezing found to have bronchitis with asthma exacerbation  REVIEW OF SYSTEMS:   Review of Systems  Constitutional: Negative for chills, fever and weight loss.  HENT: Negative for ear discharge, ear pain and nosebleeds.   Eyes: Negative for blurred vision, pain and discharge.  Respiratory: Positive for cough and shortness of breath. Negative for sputum production, wheezing and stridor.   Cardiovascular: Negative for chest pain, palpitations, orthopnea and PND.  Gastrointestinal: Negative for abdominal pain, diarrhea, nausea and vomiting.  Genitourinary: Negative for frequency and urgency.  Musculoskeletal: Negative for back pain and joint pain.  Neurological: Positive for weakness. Negative for sensory change, speech change and focal weakness.  Psychiatric/Behavioral: Negative for depression and hallucinations. The patient is not nervous/anxious.    Tolerating Diet:yes Tolerating PT: pending  DRUG ALLERGIES:   Allergies  Allergen Reactions  . Bupropion     GI issues   . Calcium     Chest pain   . Cefuroxime     Swelling   . Cephalexin   . Clinoril [Sulindac]     GI  . Codeine Hives  . Duloxetine     Bleeding   . Ezetimibe     Joint pain   . Fenofibrate     Leg cramps  . Furosemide     Fluid retention   . Glipizide     Bloating   . Lorazepam     SI  . Lortab [Hydrocodone-Acetaminophen] Hives  . Metaproterenol     Palpitations   . Morphine And Related   . Nalbuphine     Rapid heart rate  flushing   . Naproxen     Numbness   . Norfloxacin     Urinary retention   . Rofecoxib   . Sodium   . Statins     Muscle pain   . Sulfa Antibiotics     Unknown   . Tramadol   . Trazodone And Nefazodone   . Doxycycline Rash  . Iodine Rash  .  Ketoprofen Rash  . Piroxicam Rash  . Tolmetin Rash    VITALS:  Blood pressure (!) 117/54, pulse 92, temperature 97.9 F (36.6 C), temperature source Oral, resp. rate 20, height 5\' 3"  (1.6 m), weight 89.9 kg (198 lb 4.8 oz), SpO2 99 %.  PHYSICAL EXAMINATION:   Physical Exam  GENERAL:  76 y.o.-year-old patient lying in the bed with no acute distress.  EYES: Pupils equal, round, reactive to light and accommodation. No scleral icterus. Extraocular muscles intact.  HEENT: Head atraumatic, normocephalic. Oropharynx and nasopharynx clear.  NECK:  Supple, no jugular venous distention. No thyroid enlargement, no tenderness.  LUNGS: Coarse breath sounds bilaterally, no wheezing, rales, rhonchi. No use of accessory muscles of respiration.  CARDIOVASCULAR: S1, S2 normal. No murmurs, rubs, or gallops.  ABDOMEN: Soft, nontender, nondistended. Bowel sounds present. No organomegaly or mass.  EXTREMITIES: No cyanosis, clubbing or edema b/l.    NEUROLOGIC: Cranial nerves II through XII are intact. No focal Motor or sensory deficits b/l.   PSYCHIATRIC:  patient is alert and oriented x 3.  SKIN: No obvious rash, lesion, or ulcer.   LABORATORY PANEL:  CBC  Recent Labs Lab 05/13/16 0619  WBC 18.6*  HGB 12.3  HCT 35.2  PLT 165  Chemistries   Recent Labs Lab 05/12/16 1555 05/13/16 0619  NA 132* 131*  K 4.0 4.2  CL 97* 95*  CO2 25 25  GLUCOSE 123* 255*  BUN 23* 26*  CREATININE 0.92 0.87  CALCIUM 9.2 9.0  AST 34  --   ALT 20  --   ALKPHOS 84  --   BILITOT 0.9  --    Cardiac Enzymes  Recent Labs Lab 05/12/16 1555  TROPONINI <0.03   RADIOLOGY:  Dg Chest 2 View  Result Date: 05/12/2016 CLINICAL DATA:  Cough and short of breath EXAM: CHEST  2 VIEW COMPARISON:  None. FINDINGS: The heart is upper normal in size. Lungs are under aerated with basilar subsegmental atelectasis. 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 Normal vascularity. No pneumothorax or pleural  effusion. IMPRESSION: Low lung volumes and bibasilar atelectasis. Electronically Signed   By: Marybelle Killings M.D.   On: 05/12/2016 15:09   ASSESSMENT AND PLAN:  Gina Chambers  is a 76 y.o. female with a known history of Asthma, diabetes mellitus, obesity, sleep apnea, hypertension, thyroid disease, who presents to the hospital with complaints of one half week history of cough with dark green phlegm, some blood in the sputum, shortness of breath, wheezing  #1. Sepsis due to Bronchitis - Patient appears hemodynamically stable. Her white count is stable. No fever.  #2. Acute on chronic bronchitis -Patient was started on IV Levaquin yesterday and the ER had some local allergic reaction. She has lot of allergies/side effects to major group of antibiotic drugs. Discussed at length with her she is agreeable to try Levaquin orally will observe her for any side effects while she is in the hospital. -Cough medicine when necessary  #3. Acute on chronic asthma exacerbation, continue patient on steroids, inhalation therapy  #4. Leukocytosis, follow with antibiotic therapy  #5. 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 Management plans discussed with the patient, family and they are in agreement.  CODE STATUS: Full  DVT Prophylaxis:  on Coumadin  TOTAL TIME TAKING CARE OF THIS PATIENT: 30 minutes.  >50% time spent on counselling and coordination of care  POSSIBLE D/C IN one DAYS, DEPENDING ON CLINICAL CONDITION.  Note: This dictation was prepared with Dragon dictation along with smaller phrase technology. Any transcriptional errors that result from this process are unintentional.  Melva Faux M.D on 05/13/2016 at 12:57 PM  Between 7am to 6pm - Pager - [phone]  After 6pm go to [email or web address] - password [email or web address] [facility]  Office   [phone]  CC: Primary care physician; Kirk Ruths., MD

## 2016-05-14 LAB — PROTIME-INR
INR: 3.64
Prothrombin Time: 37.1 seconds — ABNORMAL HIGH (ref 11.4–15.2)

## 2016-05-14 LAB — GLUCOSE, CAPILLARY
GLUCOSE-CAPILLARY: 273 mg/dL — AB (ref 65–99)
GLUCOSE-CAPILLARY: 171 mg/dL — AB (ref 65–99)

## 2016-05-14 MED ORDER — PREDNISONE 50 MG PO TABS: 50.0000 mg | ORAL_TABLET | Freq: Every day | ORAL | Status: DC

## 2016-05-14 MED ORDER — BENZONATATE 100 MG PO CAPS
100.0000 mg | ORAL_CAPSULE | Freq: Three times a day (TID) | ORAL | Status: DC
  Administered 2016-05-14: 12:00:00 100 mg via ORAL
  Filled 2016-05-14: qty 1

## 2016-05-14 MED ORDER — LEVALBUTEROL HCL 0.63 MG/3ML IN NEBU: 0.6300 mg | INHALATION_SOLUTION | Freq: Four times a day (QID) | RESPIRATORY_TRACT | Status: DC | PRN

## 2016-05-14 MED ORDER — PREDNISONE 10 MG PO TABS: ORAL_TABLET | ORAL | 0 refills | Status: DC

## 2016-05-14 MED ORDER — GUAIFENESIN 100 MG/5ML PO SOLN: 5.0000 mL | ORAL | 0 refills | Status: DC | PRN

## 2016-05-14 MED ORDER — LEVOFLOXACIN 500 MG PO TABS: 500.0000 mg | ORAL_TABLET | Freq: Every day | ORAL | 0 refills | Status: DC

## 2016-05-14 MED ORDER — BENZONATATE 100 MG PO CAPS: 100.0000 mg | ORAL_CAPSULE | Freq: Three times a day (TID) | ORAL | 0 refills | Status: DC

## 2016-05-14 MED ORDER — GUAIFENESIN 100 MG/5ML PO SOLN
5.0000 mL | ORAL | Status: DC | PRN
  Administered 2016-05-14: 12:00:00 100 mg via ORAL
  Filled 2016-05-14 (×2): qty 10

## 2016-05-14 NOTE — Discharge Summary (Signed)
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 Asthma   . Diabetes mellitus without complication ([place])   . Hypertension   . Thyroid disease     HOSPITAL COURSE:   Gina Chambers a 76 y.o.femalewith a known history of Asthma, diabetes mellitus, obesity, sleep apnea, hypertension, thyroid disease, who presents to the hospital with complaints of one half week history of cough with dark green phlegm, some blood in the sputum, shortness of breath, wheezing  #1. Sepsis due to Bronchitis - Patient appears hemodynamically stable. Her white count is stable. No fever.  #2. Acute on chronic bronchitis -Patient was started on IV Levaquin yesterday and the ER had some local allergic reaction. She has lot of allergies/side effects to major group of antibiotic drugs. Discussed at length with her she is agreeable to try Levaquin orally will observe her for any side effects while she is in the hospital. -Cough medicine when necessary -tolerating LEvaquin ok so far -Sats 96% on RA  #3. Acute on chronic asthma exacerbation, continue patient on steroids, inhalation therapy  #4. Leukocytosis, follow with antibiotic therapy  #5. 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 Bupropion     GI issues   . Calcium     Chest pain   . Cefuroxime     Swelling   . Cephalexin   . Clinoril [Sulindac]     GI  . Codeine Hives  . Duloxetine     Bleeding   . Ezetimibe     Joint pain   . Fenofibrate     Leg cramps  . Furosemide     Fluid retention   . Glipizide     Bloating   . Lorazepam     SI  . Lortab [Hydrocodone-Acetaminophen] Hives  . Metaproterenol     Palpitations   . Morphine And Related   . Nalbuphine     Rapid heart rate  flushing   . Naproxen     Numbness   . Norfloxacin     Urinary retention   . Rofecoxib   . Sodium   . Statins     Muscle pain   . Sulfa Antibiotics     Unknown   . Tramadol   . Trazodone And Nefazodone   . Doxycycline Rash  . Iodine Rash  . Ketoprofen Rash  . Piroxicam Rash  . Tolmetin Rash    DISCHARGE MEDICATIONS:   Current Discharge Medication List    START taking these medications   Details  benzonatate (TESSALON) 100 MG capsule Take 1 capsule (100 mg total) by mouth 3 (three) times daily. Qty: 20 capsule, Refills: 0    guaiFENesin (ROBITUSSIN) 100 MG/5ML SOLN Take 5 mLs (100 mg total) by mouth every 4 (four) hours as needed for cough or to loosen phlegm. Qty: 1200 mL,  Refills: 0    levofloxacin (LEVAQUIN) 500 MG tablet Take 1 tablet (500 mg total) by mouth daily. Qty: 5 tablet, Refills: 0    predniSONE (DELTASONE) 10 MG tablet Take 50 mg daily taper by 10 mg daily then stop Qty: 15 tablet, Refills: 0      CONTINUE these medications which have NOT CHANGED   Details  aspirin EC 81 MG tablet Take 81 mg by mouth daily.    B Complex-C (SUPER B COMPLEX PO) Take 1 tablet by mouth daily.    Boswellia Serrata (BOSWELLIA PO) Take 3 capsules by mouth 2 (two) times daily.    Bromelains (BROMELAIN PO) Take 1 tablet by mouth 2 (two) times daily.    bumetanide (BUMEX) 0.5 MG tablet Take 0.5-1 mg by mouth daily. Take 2 tablets (1mg ) and 1 tablet (0.5 mg) in the evening.    insulin  glargine (LANTUS) 100 UNIT/ML injection Inject 8-10 Units into the skin at bedtime. 10 units in the morning and 8 units in the evening    insulin regular (NOVOLIN R,HUMULIN R) 250 units/2.19mL (100 units/mL) injection Inject 10 Units into the skin 3 (three) times daily before meals. Inject 10 units before meals per sliding scale.    Ipratropium-Albuterol (COMBIVENT) 20-100 MCG/ACT AERS respimat Inhale 1 puff into the lungs 4 (four) times daily as needed for wheezing or shortness of breath. May take additional inhalations prn, not to exceed 6 inhalations in 24 hours.    lansoprazole (PREVACID) 30 MG capsule Take 30 mg by mouth 2 (two) times daily.    levothyroxine (SYNTHROID, LEVOTHROID) 125 MCG tablet Take 125 mcg by mouth daily.    Magnesium 500 MG TABS Take 500 mg by mouth 3 (three) times daily.    metoprolol succinate (TOPROL-XL) 50 MG 24 hr tablet Take 50 mg by mouth daily. Take with or immediately following a meal.    Multiple Vitamins-Minerals (PRESERVISION AREDS 2 PO) Take 1 tablet by mouth 2 (two) times daily.    rOPINIRole (REQUIP) 0.5 MG tablet Take 0.5-1 mg by mouth at bedtime as needed.    tolterodine (DETROL LA) 4 MG 24 hr capsule Take 4 mg by mouth daily.    vitamin A 10000 UNIT capsule Take 10,000 Units by mouth daily.    warfarin (COUMADIN) 5 MG tablet Take 5-7.5 mg by mouth daily. Take 5 mg by mouth daily on Monday, Wednesday, Thursday, Saturday and Sunday. Take 7.5 mg by mouth daily on Tuesday and Friday.        If you experience worsening of your admission symptoms, develop shortness of breath, life threatening emergency, suicidal or homicidal thoughts you must seek medical attention immediately by calling 911 or calling your MD immediately  if symptoms less severe.  You Must read complete instructions/literature along with all the possible adverse reactions/side effects for all the Medicines you take and that have been prescribed to you. Take any new Medicines after  you have completely understood and accept all the possible adverse reactions/side effects.   Please note  You were cared for by a hospitalist during your hospital stay. If you have any questions about your discharge medications or the care you received while you were in the hospital after you are discharged, you can call the unit and asked to speak with the hospitalist on call if the hospitalist that took care of you is not available. Once you are discharged, your primary care physician will handle any further medical issues. Please note that NO REFILLS for any  discharge medications will be authorized once you are discharged, as it is imperative that you return to your primary care physician (or establish a relationship with a primary care physician if you do not have one) for your aftercare needs so that they can reassess your need for medications and monitor your lab values. Today   SUBJECTIVE   Cough+ with some phlegm now  VITAL SIGNS:  Blood pressure (!) 129/56, pulse 80, temperature 97.7 F (36.5 C), temperature source Oral, resp. rate 20, height 5\' 3"  (1.6 m), weight 89.9 kg (198 lb 4.8 oz), SpO2 96 %.  I/O:   Intake/Output Summary (Last 24 hours) at 05/14/16 1039 Last data filed at 05/14/16 1022  Gross per 24 hour  Intake              360 ml  Output                0 ml  Net              360 ml    PHYSICAL EXAMINATION:  GENERAL:  76 y.o.-year-old patient lying in the bed with no acute distress. obese EYES: Pupils equal, round, reactive to light and accommodation. No scleral icterus. Extraocular muscles intact.  HEENT: Head atraumatic, normocephalic. Oropharynx and nasopharynx clear.  NECK:  Supple, no jugular venous distention. No thyroid enlargement, no tenderness.  LUNGS: coarse breath sounds bilaterally, no wheezing, rales,rhonchi or crepitation. No use of accessory muscles of respiration.  CARDIOVASCULAR: S1, S2 normal. No murmurs, rubs, or gallops.  ABDOMEN: Soft,  non-tender, non-distended. Bowel sounds present. No organomegaly or mass.  EXTREMITIES: No pedal edema, cyanosis, or clubbing.  NEUROLOGIC: Cranial nerves II through XII are intact. Muscle strength 5/5 in all extremities. Sensation intact. Gait not checked.  PSYCHIATRIC: The patient is alert and oriented x 3.  SKIN: No obvious rash, lesion, or ulcer.   DATA REVIEW:   CBC   Recent Labs Lab 05/13/16 0619  WBC 18.6*  HGB 12.3  HCT 35.2  PLT 165    Chemistries   Recent Labs Lab 05/12/16 1555 05/13/16 0619  NA 132* 131*  K 4.0 4.2  CL 97* 95*  CO2 25 25  GLUCOSE 123* 255*  BUN 23* 26*  CREATININE 0.92 0.87  CALCIUM 9.2 9.0  AST 34  --   ALT 20  --   ALKPHOS 84  --   BILITOT 0.9  --     Microbiology Results   Recent Results (from the past 240 hour(s))  Blood culture (routine x 2)     Status: None (Preliminary result)   Collection Time: 05/12/16  5:38 PM  Result Value Ref Range Status   Specimen Description BLOOD  RIGHT HAND  Final   Special Requests   Final    BOTTLES DRAWN AEROBIC AND ANAEROBIC  AER 2 ML ANA 7 ML   Culture NO GROWTH 2 DAYS  Final   Report Status PENDING  Incomplete  Blood culture (routine x 2)     Status: None (Preliminary result)   Collection Time: 05/12/16  5:43 PM  Result Value Ref Range Status   Specimen Description BLOOD  LEFT FOREARM  Final   Special Requests   Final    BOTTLES DRAWN AEROBIC AND ANAEROBIC  AER 12 ML ANA 11 ML    Culture NO GROWTH 2 DAYS  Final   Report Status PENDING  Incomplete    RADIOLOGY:  Dg Chest 2 View  Result Date: 05/12/2016 CLINICAL DATA:  Cough  and short of breath EXAM: CHEST  2 VIEW COMPARISON:  None. FINDINGS: The heart is upper normal in size. Lungs are under aerated with basilar subsegmental atelectasis. 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 Normal vascularity. No pneumothorax or pleural effusion. IMPRESSION: Low lung volumes and bibasilar atelectasis. Electronically Signed   By:  Marybelle Killings M.D.   On: 05/12/2016 15:09     Management plans discussed with the patient, family and they are in agreement.  CODE STATUS:     Code Status Orders        Start     Ordered   05/12/16 2034  Full code  Continuous     05/12/16 2033    Code Status History    Date Active Date Inactive Code Status Order ID Comments User Context   This patient has a current code status but no historical code status.    Advance Directive Documentation   [place] Most Recent Value  Type of Advance Directive  Living will  Pre-existing out of facility DNR order (yellow form or pink MOST form)  No data  "MOST" Form in Place?  No data      TOTAL TIME TAKING CARE OF THIS PATIENT: 40 minutes.    Meline Russaw M.D on 05/14/2016 at 10:39 AM  Between 7am to 6pm - Pager - [phone] After 6pm go to [email or web address] - password [email or web address] [facility]  Office  [phone]  CC: Primary care physician; Kirk Ruths., MD

## 2016-05-14 NOTE — Care Management Important Message (Signed)
Important Message  Patient Details  Name: Gina Chambers MRN: [id number] Date of Birth: July 08, 1940   Medicare Important Message Given:  Yes    Shelbie Ammons, RN 05/14/2016, 8:52 AM

## 2016-05-14 NOTE — Progress Notes (Signed)
Pt is being discharged today. Discharge instructions given to patient, she verified understanding. 0 paper prescriptions given to patient. IV x1 removed. All belongings packed and returned to Gina Chambers. She was rolled out in wheelchair by staff.

## 2016-05-15 LAB — HEMOGLOBIN A1C
HEMOGLOBIN A1C: 5.2 % (ref 4.8–5.6)
MEAN PLASMA GLUCOSE: 103 mg/dL

## 2016-05-17 LAB — CULTURE, BLOOD (ROUTINE X 2)
Culture: NO GROWTH
Culture: NO GROWTH

## 2016-05-29 ENCOUNTER — Ambulatory Visit
Admission: RE | Admit: 2016-05-29 | Discharge: 2016-05-29 | Disposition: A | Discharge status: Home / Self Care | Payer: Medicare Other | Source: Ambulatory Visit | Attending: Internal Medicine | Admitting: Internal Medicine

## 2016-05-29 ENCOUNTER — Other Ambulatory Visit: Payer: Self-pay | Admitting: Internal Medicine

## 2016-05-29 DIAGNOSIS — R252 Cramp and spasm: Secondary | ICD-10-CM

## 2016-06-19 DIAGNOSIS — I5030 Unspecified diastolic (congestive) heart failure: Secondary | ICD-10-CM | POA: Insufficient documentation

## 2016-08-07 ENCOUNTER — Other Ambulatory Visit: Payer: Self-pay | Admitting: Internal Medicine

## 2016-08-07 DIAGNOSIS — Z1231 Encounter for screening mammogram for malignant neoplasm of breast: Secondary | ICD-10-CM

## 2016-08-29 ENCOUNTER — Ambulatory Visit: Payer: Medicare Other

## 2016-09-05 ENCOUNTER — Ambulatory Visit: Payer: Medicare Other

## 2016-09-11 ENCOUNTER — Other Ambulatory Visit: Payer: Self-pay | Admitting: Internal Medicine

## 2016-09-11 ENCOUNTER — Ambulatory Visit
Admission: RE | Admit: 2016-09-11 | Discharge: 2016-09-11 | Disposition: A | Discharge status: Home / Self Care | Payer: Medicare Other | Source: Ambulatory Visit | Attending: Internal Medicine | Admitting: Internal Medicine

## 2016-09-11 DIAGNOSIS — Z1231 Encounter for screening mammogram for malignant neoplasm of breast: Secondary | ICD-10-CM

## 2016-10-09 ENCOUNTER — Other Ambulatory Visit: Payer: Self-pay | Admitting: *Deleted

## 2016-10-09 ENCOUNTER — Inpatient Hospital Stay
Admission: RE | Admit: 2016-10-09 | Discharge: 2016-10-09 | Disposition: A | Discharge status: Home / Self Care | Payer: Self-pay | Source: Ambulatory Visit | Attending: *Deleted | Admitting: *Deleted

## 2016-10-09 DIAGNOSIS — Z9289 Personal history of other medical treatment: Secondary | ICD-10-CM

## 2016-11-07 ENCOUNTER — Observation Stay
Admission: EM | Admit: 2016-11-07 | Discharge: 2016-11-09 | Disposition: A | Discharge status: Home / Self Care | Payer: Medicare Other | Attending: Internal Medicine | Admitting: Internal Medicine

## 2016-11-07 ENCOUNTER — Encounter: Payer: Self-pay | Admitting: Emergency Medicine

## 2016-11-07 DIAGNOSIS — K640 First degree hemorrhoids: Secondary | ICD-10-CM | POA: Insufficient documentation

## 2016-11-07 DIAGNOSIS — Z7982 Long term (current) use of aspirin: Secondary | ICD-10-CM | POA: Diagnosis not present

## 2016-11-07 DIAGNOSIS — Z794 Long term (current) use of insulin: Secondary | ICD-10-CM | POA: Diagnosis not present

## 2016-11-07 DIAGNOSIS — Z86718 Personal history of other venous thrombosis and embolism: Secondary | ICD-10-CM | POA: Insufficient documentation

## 2016-11-07 DIAGNOSIS — K5730 Diverticulosis of large intestine without perforation or abscess without bleeding: Secondary | ICD-10-CM | POA: Diagnosis not present

## 2016-11-07 DIAGNOSIS — Z79899 Other long term (current) drug therapy: Secondary | ICD-10-CM | POA: Diagnosis not present

## 2016-11-07 DIAGNOSIS — D124 Benign neoplasm of descending colon: Principal | ICD-10-CM | POA: Insufficient documentation

## 2016-11-07 DIAGNOSIS — Z7901 Long term (current) use of anticoagulants: Secondary | ICD-10-CM | POA: Diagnosis not present

## 2016-11-07 DIAGNOSIS — E079 Disorder of thyroid, unspecified: Secondary | ICD-10-CM | POA: Diagnosis not present

## 2016-11-07 DIAGNOSIS — K625 Hemorrhage of anus and rectum: Secondary | ICD-10-CM | POA: Diagnosis present

## 2016-11-07 DIAGNOSIS — E119 Type 2 diabetes mellitus without complications: Secondary | ICD-10-CM | POA: Diagnosis not present

## 2016-11-07 DIAGNOSIS — K922 Gastrointestinal hemorrhage, unspecified: Secondary | ICD-10-CM | POA: Diagnosis present

## 2016-11-07 DIAGNOSIS — I10 Essential (primary) hypertension: Secondary | ICD-10-CM | POA: Insufficient documentation

## 2016-11-07 LAB — CBC
HCT: 40.9 % (ref 35.0–47.0)
Hemoglobin: 14 g/dL (ref 12.0–16.0)
MCH: 30.3 pg (ref 26.0–34.0)
MCHC: 34.3 g/dL (ref 32.0–36.0)
MCV: 88.3 fL (ref 80.0–100.0)
PLATELETS: 202 10*3/uL (ref 150–440)
RBC: 4.63 MIL/uL (ref 3.80–5.20)
RDW: 13.9 % (ref 11.5–14.5)
WBC: 8.5 10*3/uL (ref 3.6–11.0)

## 2016-11-07 LAB — COMPREHENSIVE METABOLIC PANEL
ALBUMIN: 4 g/dL (ref 3.5–5.0)
ALK PHOS: 75 U/L (ref 38–126)
ALT: 20 U/L (ref 14–54)
AST: 26 U/L (ref 15–41)
Anion gap: 9 (ref 5–15)
BUN: 18 mg/dL (ref 6–20)
CALCIUM: 9.2 mg/dL (ref 8.9–10.3)
CO2: 25 mmol/L (ref 22–32)
CREATININE: 0.78 mg/dL (ref 0.44–1.00)
Chloride: 104 mmol/L (ref 101–111)
GFR calc Af Amer: >60 mL/min (ref >60)
GFR calc non Af Amer: >60 mL/min (ref >60)
GLUCOSE: 159 mg/dL — AB (ref 65–99)
Potassium: 4.3 mmol/L (ref 3.5–5.1)
SODIUM: 138 mmol/L (ref 135–145)
Total Bilirubin: 0.8 mg/dL (ref 0.3–1.2)
Total Protein: 7 g/dL (ref 6.5–8.1)

## 2016-11-07 LAB — HEMOGLOBIN: HEMOGLOBIN: 13.5 g/dL (ref 12.0–16.0)

## 2016-11-07 LAB — PROTIME-INR
INR: 1.55
PROTHROMBIN TIME: 18.7 s — AB (ref 11.4–15.2)

## 2016-11-07 LAB — GLUCOSE, CAPILLARY: Glucose-Capillary: 99 mg/dL (ref 65–99)

## 2016-11-07 MED ORDER — POTASSIUM CHLORIDE IN NACL 20-0.9 MEQ/L-% IV SOLN
INTRAVENOUS | Status: DC
  Administered 2016-11-07: 21:00:00 via INTRAVENOUS
  Administered 2016-11-08: 1000 mL via INTRAVENOUS
  Administered 2016-11-08: via INTRAVENOUS
  Filled 2016-11-07 (×5): qty 1000

## 2016-11-07 MED ORDER — ONDANSETRON HCL 4 MG/2ML IJ SOLN: 4.0000 mg | Freq: Four times a day (QID) | INTRAMUSCULAR | Status: DC | PRN

## 2016-11-07 MED ORDER — ONDANSETRON HCL 4 MG PO TABS: 4.0000 mg | ORAL_TABLET | Freq: Four times a day (QID) | ORAL | Status: DC | PRN

## 2016-11-07 MED ORDER — INSULIN ASPART 100 UNIT/ML ~~LOC~~ SOLN: 0.0000 [IU] | Freq: Three times a day (TID) | SUBCUTANEOUS | Status: DC

## 2016-11-07 MED ORDER — ROPINIROLE HCL 1 MG PO TABS
0.5000 mg | ORAL_TABLET | Freq: Every day | ORAL | Status: DC
  Administered 2016-11-07 – 2016-11-08 (×2): 0.5 mg via ORAL
  Filled 2016-11-07 (×2): qty 1

## 2016-11-07 MED ORDER — BUMETANIDE 1 MG PO TABS
0.5000 mg | ORAL_TABLET | Freq: Every day | ORAL | Status: DC
  Administered 2016-11-07 – 2016-11-08 (×2): 0.5 mg via ORAL
  Filled 2016-11-07 (×3): qty 1

## 2016-11-07 MED ORDER — INSULIN ASPART 100 UNIT/ML ~~LOC~~ SOLN: 0.0000 [IU] | Freq: Every day | SUBCUTANEOUS | Status: DC

## 2016-11-07 MED ORDER — LEVOTHYROXINE SODIUM 50 MCG PO TABS
125.0000 ug | ORAL_TABLET | Freq: Every day | ORAL | Status: DC
  Administered 2016-11-08: 125 ug via ORAL
  Filled 2016-11-07: qty 1

## 2016-11-07 MED ORDER — BUMETANIDE 1 MG PO TABS
1.0000 mg | ORAL_TABLET | Freq: Every day | ORAL | Status: DC
  Filled 2016-11-07 (×2): qty 1

## 2016-11-07 NOTE — ED Triage Notes (Signed)
FIRST NURSE NOTE-pt ambulatory to triage desk without difficulty. Here for rectal bleeding.

## 2016-11-07 NOTE — ED Notes (Signed)
Informed RN bed ready  [phone]

## 2016-11-07 NOTE — H&P (Signed)
[facility] NAME: Gina Chambers    MR#:  [id number]  DATE OF BIRTH:  1940-04-28  DATE OF ADMISSION:  11/07/2016  PRIMARY CARE PHYSICIAN: Kirk Ruths, MD   REQUESTING/REFERRING PHYSICIAN: Harvest Dark, MD  CHIEF COMPLAINT:   Chief Complaint  Patient presents with  . Rectal Bleeding   Rectal bleeding 3 episode today. HISTORY OF PRESENT ILLNESS:  Gina Chambers  is a 76 y.o. female with a known history of Multiple Episodes of leg DVT,  Hypertension, diabetes, asthma and thyroid disease. She has been on Coumadin for leg DVT. The patient had a rectal bleeding with fresh blood 3 episode today. She had rectal bleeding twice in the past. Colonoscopy performed in [place] was normal. She was taken off Coumadin twice due to GI bleeding but resume Coumadin due to developing DVT again. She denies any abdominal pain, nausea or diarrhea.  PAST MEDICAL HISTORY:   Past Medical History:  Diagnosis Date  . Asthma   . Diabetes mellitus without complication ([place])   . Hypertension   . Thyroid disease     PAST SURGICAL HISTORY:   Past Surgical History:  Procedure Laterality Date  . CARDIAC SURGERY    . HERNIA REPAIR    . VERTICAL BANDED GASTROPLASTY      SOCIAL HISTORY:   Social History  Substance Use Topics  . Smoking status: Never Smoker  . Smokeless tobacco: Never Used  . Alcohol use No    FAMILY HISTORY:   Family History  Problem Relation Age of Onset  . Breast cancer Mother        72's  . Breast cancer Maternal Grandmother   . Breast cancer Paternal Grandmother     DRUG ALLERGIES:   Allergies  Allergen Reactions  . Bupropion     GI issues   . Calcium     Chest pain   . Cefuroxime     Swelling   . Cephalexin   . Clinoril [Sulindac]     GI  . Codeine Hives  . Duloxetine     Bleeding   . Ezetimibe     Joint pain   . Fenofibrate     Leg cramps  . Furosemide     Fluid retention   .  Glipizide     Bloating   . Lorazepam     SI  . Lortab [Hydrocodone-Acetaminophen] Hives  . Metaproterenol     Palpitations   . Morphine And Related   . Nalbuphine     Rapid heart rate  flushing   . Naproxen     Numbness   . Norfloxacin     Urinary retention   . Rofecoxib   . Sodium   . Statins     Muscle pain   . Sulfa Antibiotics     Unknown   . Tramadol   . Trazodone And Nefazodone   . Doxycycline Rash  . Iodine Rash  . Ketoprofen Rash  . Piroxicam Rash  . Tolmetin Rash    REVIEW OF SYSTEMS:   Review of Systems  Constitutional: Negative for chills, fever and malaise/fatigue.  HENT: Negative for nosebleeds and sore throat.   Eyes: Negative for blurred vision and double vision.  Respiratory: Negative for cough, shortness of breath, wheezing and stridor.   Cardiovascular: Negative for chest pain, palpitations and leg swelling.  Gastrointestinal: Positive for blood in stool. Negative for abdominal pain, constipation, diarrhea, melena, nausea and vomiting.  Genitourinary:  Negative for dysuria and hematuria.  Musculoskeletal: Negative for back pain.  Skin: Negative for itching and rash.  Neurological: Negative for dizziness, focal weakness, loss of consciousness, weakness and headaches.  Psychiatric/Behavioral: Negative for depression. The patient is not nervous/anxious.     MEDICATIONS AT HOME:   Prior to Admission medications   Medication Sig Start Date End Date Taking? Authorizing Provider  aspirin EC 81 MG tablet Take 81 mg by mouth daily.    [provider]  B Complex-C (SUPER B COMPLEX PO) Take 1 tablet by mouth daily.    [provider]  benzonatate (TESSALON) 100 MG capsule Take 1 capsule (100 mg total) by mouth 3 (three) times daily. 05/14/16   Fritzi Mandes, MD  Azucena Freed Serrata (BOSWELLIA PO) Take 3 capsules by mouth 2 (two) times daily.    [provider]  Bromelains (BROMELAIN PO) Take 1 tablet by mouth 2 (two) times daily.     [provider]  bumetanide (BUMEX) 0.5 MG tablet Take 0.5-1 mg by mouth daily. Take 2 tablets (1mg ) and 1 tablet (0.5 mg) in the evening.    [provider]  guaiFENesin (ROBITUSSIN) 100 MG/5ML SOLN Take 5 mLs (100 mg total) by mouth every 4 (four) hours as needed for cough or to loosen phlegm. 05/14/16   Fritzi Mandes, MD  insulin glargine (LANTUS) 100 UNIT/ML injection Inject 8-10 Units into the skin at bedtime. 10 units in the morning and 8 units in the evening    [provider]  insulin regular (NOVOLIN R,HUMULIN R) 250 units/2.78mL (100 units/mL) injection Inject 10 Units into the skin 3 (three) times daily before meals. Inject 10 units before meals per sliding scale.    [provider]  Ipratropium-Albuterol (COMBIVENT) 20-100 MCG/ACT AERS respimat Inhale 1 puff into the lungs 4 (four) times daily as needed for wheezing or shortness of breath. May take additional inhalations prn, not to exceed 6 inhalations in 24 hours.    [provider]  lansoprazole (PREVACID) 30 MG capsule Take 30 mg by mouth 2 (two) times daily.    [provider]  levofloxacin (LEVAQUIN) 500 MG tablet Take 1 tablet (500 mg total) by mouth daily. 05/14/16   Fritzi Mandes, MD  levothyroxine (SYNTHROID, LEVOTHROID) 125 MCG tablet Take 125 mcg by mouth daily.    [provider]  Magnesium 500 MG TABS Take 500 mg by mouth 3 (three) times daily.    [provider]  metoprolol succinate (TOPROL-XL) 50 MG 24 hr tablet Take 50 mg by mouth daily. Take with or immediately following a meal.    [provider]  Multiple Vitamins-Minerals (PRESERVISION AREDS 2 PO) Take 1 tablet by mouth 2 (two) times daily.    [provider]  predniSONE (DELTASONE) 10 MG tablet Take 50 mg daily taper by 10 mg daily then stop 05/15/16   Fritzi Mandes, MD  rOPINIRole (REQUIP) 0.5 MG tablet Take 0.5-1 mg by mouth at bedtime as needed.    [provider]  tolterodine  (DETROL LA) 4 MG 24 hr capsule Take 4 mg by mouth daily.    [provider]  vitamin A 10000 UNIT capsule Take 10,000 Units by mouth daily.    [provider]  warfarin (COUMADIN) 5 MG tablet Take 5-7.5 mg by mouth daily. Take 5 mg by mouth daily on Monday, Wednesday, Thursday, Saturday and Sunday. Take 7.5 mg by mouth daily on Tuesday and Friday.    [provider]  VITAL SIGNS:  Blood pressure 121/65, pulse 79, temperature 98 F (36.7 C), temperature source Oral, resp. rate 17, SpO2 98 %.  PHYSICAL EXAMINATION:  Physical Exam  GENERAL:  76 y.o.-year-old patient lying in the bed with no acute distress. Obese. EYES: Pupils equal, round, reactive to light and accommodation. No scleral icterus. Extraocular muscles intact.  HEENT: Head atraumatic, normocephalic. Oropharynx and nasopharynx clear.  NECK:  Supple, no jugular venous distention. No thyroid enlargement, no tenderness.  LUNGS: Normal breath sounds bilaterally, no wheezing, rales,rhonchi or crepitation. No use of accessory muscles of respiration.  CARDIOVASCULAR: S1, S2 normal. No murmurs, rubs, or gallops.  ABDOMEN: Soft, nontender, nondistended. Bowel sounds present. No organomegaly or mass.  EXTREMITIES: No pedal edema, cyanosis, or clubbing.  NEUROLOGIC: Cranial nerves II through XII are intact. Muscle strength 5/5 in all extremities. Sensation intact. Gait not checked.  PSYCHIATRIC: The patient is alert and oriented x 3.  SKIN: No obvious rash, lesion, or ulcer.   LABORATORY PANEL:   CBC  Recent Labs Lab 11/07/16 1300  WBC 8.5  HGB 14.0  HCT 40.9  PLT 202   ------------------------------------------------------------------------------------------------------------------  Chemistries   Recent Labs Lab 11/07/16 1300  NA 138  K 4.3  CL 104  CO2 25  GLUCOSE 159*  BUN 18  CREATININE 0.78  CALCIUM 9.2  AST 26  ALT 20  ALKPHOS 75  BILITOT 0.8    ------------------------------------------------------------------------------------------------------------------  Cardiac Enzymes No results for input(s): TROPONINI in the last 168 hours. ------------------------------------------------------------------------------------------------------------------  RADIOLOGY:  No results found.    IMPRESSION AND PLAN:   Rectal bleeding. The patient will be placed for observation. Hold aspirin and Coumadin, follow-up hemoglobin every 6 hours, GI consult for possible colonoscopy.  History of multiple leg DVT. Hold Coumadin due to GI bleeding. Hypertension. Hold hypertension medication since blood pressure is still low side. Diabetes. Basal Lantus 8 units at at bedtime and start sliding scale. History of mitral valve replacement.  All the records are reviewed and case discussed with ED provider. Management plans discussed with the patient, her daughter and they are in agreement.  CODE STATUS: Full code  TOTAL TIME TAKING CARE OF THIS PATIENT: 53 minutes.    Demetrios Loll M.D on 11/07/2016 at 7:00 PM  Between 7am to 6pm - Pager - [phone]  After 6pm go to [email or web address] - [employer] [facility]  Office  [phone]  CC: Primary care physician; Kirk Ruths, MD   Note: This dictation was prepared with Dragon dictation along with smaller phrase technology. Any transcriptional errors that result from this process are unintentional.

## 2016-11-07 NOTE — ED Notes (Signed)
Hemoccult positive. Performed by Dr. Kerman Passey.

## 2016-11-07 NOTE — ED Provider Notes (Signed)
[facility] Emergency Department Provider Note  Time seen: 4:43 PM  I have reviewed the triage vital signs and the nursing notes.   HISTORY  Chief Complaint Rectal Bleeding    HPI Gina Chambers is a 76 y.o. female with a past medical history of asthma, diabetes, hypertension, presents to the emergency department for rectal bleeding on Coumadin. According to the patient since 2016 this is now the third episode of rectal bleeding she has experienced. In 2016 she had a colonoscopy performed in [place] which per patient was normal. Patient is currently on Coumadin. Patient states around 12 PM today she developed rectal bleeding states 3 bowel movements of bright red blood. Went to see her doctor who referred her to the ER. Here the patient has not had any further bowel movements for the past 4 hours. Denies any abdominal pain at any point. Denies any nausea or vomiting at any point. Denies dizziness or lightheadedness. Shortness of breath which is chronic.  Past Medical History:  Diagnosis Date  . Asthma   . Diabetes mellitus without complication ([place])   . Hypertension   . Thyroid disease     Patient Active Problem List   Diagnosis Date Noted  . COPD exacerbation ([place]) 05/12/2016  . Community acquired pneumonia 05/12/2016  . Leukocytosis 05/12/2016  . Generalized weakness 05/12/2016    Past Surgical History:  Procedure Laterality Date  . CARDIAC SURGERY    . HERNIA REPAIR    . VERTICAL BANDED GASTROPLASTY      Prior to Admission medications   Medication Sig Start Date End Date Taking? Authorizing Provider  aspirin EC 81 MG tablet Take 81 mg by mouth daily.    [provider]  B Complex-C (SUPER B COMPLEX PO) Take 1 tablet by mouth daily.    [provider]  benzonatate (TESSALON) 100 MG capsule Take 1 capsule (100 mg total) by mouth 3 (three) times daily. 05/14/16   Fritzi Mandes, MD  Azucena Freed Serrata (BOSWELLIA PO) Take 3 capsules by  mouth 2 (two) times daily.    [provider]  Bromelains (BROMELAIN PO) Take 1 tablet by mouth 2 (two) times daily.    [provider]  bumetanide (BUMEX) 0.5 MG tablet Take 0.5-1 mg by mouth daily. Take 2 tablets (1mg ) and 1 tablet (0.5 mg) in the evening.    [provider]  guaiFENesin (ROBITUSSIN) 100 MG/5ML SOLN Take 5 mLs (100 mg total) by mouth every 4 (four) hours as needed for cough or to loosen phlegm. 05/14/16   Fritzi Mandes, MD  insulin glargine (LANTUS) 100 UNIT/ML injection Inject 8-10 Units into the skin at bedtime. 10 units in the morning and 8 units in the evening    [provider]  insulin regular (NOVOLIN R,HUMULIN R) 250 units/2.11mL (100 units/mL) injection Inject 10 Units into the skin 3 (three) times daily before meals. Inject 10 units before meals per sliding scale.    [provider]  Ipratropium-Albuterol (COMBIVENT) 20-100 MCG/ACT AERS respimat Inhale 1 puff into the lungs 4 (four) times daily as needed for wheezing or shortness of breath. May take additional inhalations prn, not to exceed 6 inhalations in 24 hours.    [provider]  lansoprazole (PREVACID) 30 MG capsule Take 30 mg by mouth 2 (two) times daily.    [provider]  levofloxacin (LEVAQUIN) 500 MG tablet Take 1 tablet (500 mg total) by mouth daily. 05/14/16   Fritzi Mandes, MD  levothyroxine ([place], [place])  125 MCG tablet Take 125 mcg by mouth daily.    [provider]  Magnesium 500 MG TABS Take 500 mg by mouth 3 (three) times daily.    [provider]  metoprolol succinate (TOPROL-XL) 50 MG 24 hr tablet Take 50 mg by mouth daily. Take with or immediately following a meal.    [provider]  Multiple Vitamins-Minerals (PRESERVISION AREDS 2 PO) Take 1 tablet by mouth 2 (two) times daily.    [provider]  predniSONE (DELTASONE) 10 MG tablet Take 50 mg daily taper by 10 mg daily then stop 05/15/16   Fritzi Mandes, MD  rOPINIRole (REQUIP) 0.5 MG tablet Take 0.5-1 mg by mouth at bedtime as needed.    [provider]  tolterodine (DETROL LA) 4 MG 24 hr capsule Take 4 mg by mouth daily.    [provider]  vitamin A 10000 UNIT capsule Take 10,000 Units by mouth daily.    [provider]  warfarin (COUMADIN) 5 MG tablet Take 5-7.5 mg by mouth daily. Take 5 mg by mouth daily on Monday, Wednesday, Thursday, Saturday and Sunday. Take 7.5 mg by mouth daily on Tuesday and Friday.    [provider]    Allergies  Allergen Reactions  . Bupropion     GI issues   . Calcium     Chest pain   . Cefuroxime     Swelling   . Cephalexin   . Clinoril [Sulindac]     GI  . Codeine Hives  . Duloxetine     Bleeding   . Ezetimibe     Joint pain   . Fenofibrate     Leg cramps  . Furosemide     Fluid retention   . Glipizide     Bloating   . Lorazepam     SI  . Lortab [Hydrocodone-Acetaminophen] Hives  . Metaproterenol     Palpitations   . Morphine And Related   . Nalbuphine     Rapid heart rate  flushing   . Naproxen     Numbness   . Norfloxacin     Urinary retention   . Rofecoxib   . Sodium   . Statins     Muscle pain   . Sulfa Antibiotics     Unknown   . Tramadol   . Trazodone And Nefazodone   . Doxycycline Rash  . Iodine Rash  . Ketoprofen Rash  . Piroxicam Rash  . Tolmetin Rash    Family History  Problem Relation Age of Onset  . Breast cancer Mother        70 's  . Breast cancer Maternal Grandmother   . Breast cancer Paternal Grandmother     Social History Social History  Substance Use Topics  . Smoking status: Never Smoker  . Smokeless tobacco: Never Used  . Alcohol use No    Review of Systems Constitutional: Negative for fever.Negative for lightheadedness or dizziness. Cardiovascular: Negative for chest pain. Respiratory: Mild shortness of breath, chronic per patient. Gastrointestinal: Negative for abdominal  pain Musculoskeletal: Negative for back pain. States restless legs which is chronic. Neurological: Negative for headache All other ROS negative  ____________________________________________   PHYSICAL EXAM:  VITAL SIGNS: ED Triage Vitals [11/07/16 1304]  Enc Vitals Group     BP 137/60     Pulse Rate 85     Resp 17     Temp      Temp src  SpO2 98 %     Weight      Height      Head Circumference      Peak Flow      Pain Score      Pain Loc      Pain Edu?      Excl. in [place]?     Constitutional: Alert and oriented. Well appearing and in no distress. Eyes: Normal exam ENT   Head: Normocephalic and atraumatic.   Mouth/Throat: Mucous membranes are moist. Cardiovascular: Normal rate, regular rhythm. Respiratory: Normal respiratory effort without tachypnea nor retractions. Breath sounds are clear  Gastrointestinal: Soft and nontender. No distention. Musculoskeletal: Nontender with normal range of motion in all extremities. Neurologic:  Normal speech and language. No gross focal neurologic deficits Skin:  Skin is warm, dry and intact.  Psychiatric: Mood and affect are normal.  ____________________________________________   INITIAL IMPRESSION / ASSESSMENT AND PLAN / ED COURSE  Pertinent labs & imaging results that were available during my care of the patient were reviewed by me and considered in my medical decision making (see chart for details).  Patient presents to the emergency department for rectal bleeding states 3 bowel movements which started around 12 PM however it is now stopped 4 hours. Patient's labs are normal including H&H. INR is 1.55. We will perform a rectal examination and continue to closely monitor the patient. Patient has no complaints at this time. No abdominal pain no tenderness.  Rectal exam shows a fairly large amount of gross blood in the rectum. Given gross blood in the rectum on Coumadin we will admit to the hospital for lower GI  bleed. ____________________________________________   FINAL CLINICAL IMPRESSION(S) / ED DIAGNOSES  Rectal bleeding    Harvest Dark, MD 11/07/16 1742

## 2016-11-07 NOTE — Progress Notes (Signed)
Called Dr. Ara Kussmaul regarding patient request for home medications and cpap.  Appropriate orders were placed.  Christene Slates   11/07/2016 11:37 PM

## 2016-11-07 NOTE — Progress Notes (Signed)
Patient arrived to 207

## 2016-11-07 NOTE — ED Triage Notes (Signed)
Pt started with bright red rectal bleeding today.  Is on coumadin. Denies any pain.  Has slowed down and started to clot per pt.  ambulatory to triage.  Denies pain. No vomiting.

## 2016-11-08 DIAGNOSIS — D124 Benign neoplasm of descending colon: Secondary | ICD-10-CM | POA: Diagnosis not present

## 2016-11-08 DIAGNOSIS — K640 First degree hemorrhoids: Secondary | ICD-10-CM

## 2016-11-08 DIAGNOSIS — K625 Hemorrhage of anus and rectum: Secondary | ICD-10-CM | POA: Diagnosis not present

## 2016-11-08 DIAGNOSIS — K5730 Diverticulosis of large intestine without perforation or abscess without bleeding: Secondary | ICD-10-CM

## 2016-11-08 LAB — GLUCOSE, CAPILLARY
GLUCOSE-CAPILLARY: 108 mg/dL — AB (ref 65–99)
GLUCOSE-CAPILLARY: 108 mg/dL — AB (ref 65–99)
Glucose-Capillary: 89 mg/dL (ref 65–99)
Glucose-Capillary: 107 mg/dL — ABNORMAL HIGH (ref 65–99)

## 2016-11-08 LAB — HEMOGLOBIN
Hemoglobin: 13.2 g/dL (ref 12.0–16.0)
Hemoglobin: 13.3 g/dL (ref 12.0–16.0)

## 2016-11-08 LAB — ABO/RH: ABO/RH(D): A NEG

## 2016-11-08 MED ORDER — MAGNESIUM OXIDE 400 (241.3 MG) MG PO TABS
400.0000 mg | ORAL_TABLET | Freq: Three times a day (TID) | ORAL | Status: DC
  Administered 2016-11-08: 400 mg via ORAL
  Filled 2016-11-08: qty 1

## 2016-11-08 MED ORDER — GUAIFENESIN 100 MG/5ML PO SOLN
5.0000 mL | ORAL | Status: DC | PRN
  Filled 2016-11-08: qty 5

## 2016-11-08 MED ORDER — SODIUM CHLORIDE 0.9 % IV SOLN: INTRAVENOUS | Status: DC

## 2016-11-08 MED ORDER — PANTOPRAZOLE SODIUM 40 MG PO TBEC: 40.0000 mg | DELAYED_RELEASE_TABLET | Freq: Every day | ORAL | Status: DC

## 2016-11-08 MED ORDER — INSULIN GLARGINE 100 UNIT/ML ~~LOC~~ SOLN
10.0000 [IU] | Freq: Two times a day (BID) | SUBCUTANEOUS | Status: DC
  Filled 2016-11-08 (×4): qty 0.1

## 2016-11-08 MED ORDER — INSULIN GLARGINE 100 UNIT/ML ~~LOC~~ SOLN: 8.0000 [IU] | Freq: Every day | SUBCUTANEOUS | Status: DC

## 2016-11-08 MED ORDER — LEVOTHYROXINE SODIUM 50 MCG PO TABS: 125.0000 ug | ORAL_TABLET | Freq: Every day | ORAL | Status: DC

## 2016-11-08 MED ORDER — PANTOPRAZOLE SODIUM 40 MG IV SOLR
40.0000 mg | Freq: Two times a day (BID) | INTRAVENOUS | Status: DC
  Administered 2016-11-08 (×2): 40 mg via INTRAVENOUS
  Filled 2016-11-08 (×2): qty 40

## 2016-11-08 MED ORDER — PEG 3350-KCL-NA BICARB-NACL 420 G PO SOLR
4000.0000 mL | Freq: Once | ORAL | Status: AC
  Administered 2016-11-08: 4000 mL via ORAL
  Filled 2016-11-08: qty 4000

## 2016-11-08 NOTE — Consult Note (Signed)
Jonathon Bellows MD, MRCP(U.K) [address]  [place]  [place], [place] [address]  Main: [phone]  Fax: [phone]  Consultation  Referring Provider:    Dr Verdell Carmine  Primary Care Physician:  Kirk Ruths, MD Primary Gastroenterologist: None  Reason for Consultation:     Rectal bleeding   Date of Admission:  11/07/2016 Date of Consultation:  11/08/2016         HPI:   Gina Chambers is a 76 y.o. female was admitted yesterday with rectal bleeding . 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 Has been on Coumadin for DVT of the left leg. She had a colonoscopy at [place] for the same reason and was told was normal . And subsequent resumption of the coumadin. Hb on admission was 13.5 grams and has not changed on 3 checks. INR 1.55 .   She says all of a sudden had rectal bleeding yesterday which was painless, had a total of 7 episodes which she states "very large 3 cups each time of blood" Last episode was earlier today .  Denies any NSAID use.   No abdominal pain. Last colonscopy was in 2016 which she recalls was normal except for a single small polyp of thecolon . She did note that she was told she had small hemorroids.   Past Medical History:  Diagnosis Date  . Asthma   . Diabetes mellitus without complication ([place])   . Hypertension   . Thyroid disease     Past Surgical History:  Procedure Laterality Date  . CARDIAC SURGERY    . HERNIA REPAIR    . VERTICAL BANDED GASTROPLASTY      Prior to Admission medications   Medication Sig Start Date End Date Taking? Authorizing Provider  aspirin EC 81 MG tablet Take 81 mg by mouth daily.   Yes [provider]  B Complex-C (SUPER B COMPLEX PO) Take 1 tablet by mouth daily.   Yes [provider]  insulin glargine (LANTUS) 100 UNIT/ML injection Inject 8-10 Units into the skin at bedtime. 10 units in the morning and 10 units in the evening   Yes [provider]  insulin regular  (NOVOLIN R,HUMULIN R) 250 units/2.54mL (100 units/mL) injection Inject 10 Units into the skin 3 (three) times daily before meals. Inject 10 units before meals per sliding scale.   Yes [provider]  Ipratropium-Albuterol (COMBIVENT) 20-100 MCG/ACT AERS respimat Inhale 1 puff into the lungs 4 (four) times daily as needed for wheezing or shortness of breath. May take additional inhalations prn, not to exceed 6 inhalations in 24 hours.   Yes [provider]  lansoprazole (PREVACID) 30 MG capsule Take 30 mg by mouth 2 (two) times daily.   Yes [provider]  levothyroxine (SYNTHROID, LEVOTHROID) 125 MCG tablet Take 125 mcg by mouth daily.   Yes [provider]  Magnesium 500 MG TABS Take 500 mg by mouth 3 (three) times daily.   Yes [provider]  metoprolol succinate (TOPROL-XL) 50 MG 24 hr tablet Take 50 mg by mouth daily. Take with or immediately following a meal.   Yes [provider]  warfarin (COUMADIN) 5 MG tablet Take 5 mg by mouth daily.    Yes [provider]  benzonatate (TESSALON) 100 MG capsule Take 1 capsule (100 mg total) by mouth 3 (three) times daily. Patient not taking: Reported on 11/07/2016 05/14/16   Fritzi Mandes, MD  Azucena Freed Serrata (BOSWELLIA PO)  Take 3 capsules by mouth 2 (two) times daily.    [provider]  Bromelains (BROMELAIN PO) Take 1 tablet by mouth 2 (two) times daily.    [provider]  bumetanide (BUMEX) 0.5 MG tablet Take 0.5-1 mg by mouth daily. Take 2 tablets (1mg ) and 1 tablet (0.5 mg) in the evening.    [provider]  guaiFENesin (ROBITUSSIN) 100 MG/5ML SOLN Take 5 mLs (100 mg total) by mouth every 4 (four) hours as needed for cough or to loosen phlegm. Patient not taking: Reported on 11/07/2016 05/14/16   Fritzi Mandes, MD  levofloxacin (LEVAQUIN) 500 MG tablet Take 1 tablet (500 mg total) by mouth daily. Patient not taking: Reported on 11/07/2016 05/14/16   Fritzi Mandes, MD    Multiple Vitamins-Minerals (PRESERVISION AREDS 2 PO) Take 1 tablet by mouth 2 (two) times daily.    [provider]  predniSONE (DELTASONE) 10 MG tablet Take 50 mg daily taper by 10 mg daily then stop Patient not taking: Reported on 11/07/2016 05/15/16   Fritzi Mandes, MD  rOPINIRole (REQUIP) 0.5 MG tablet Take 0.5-1 mg by mouth at bedtime as needed.    [provider]  tolterodine (DETROL LA) 4 MG 24 hr capsule Take 4 mg by mouth daily.    [provider]    Family History  Problem Relation Age of Onset  . Breast cancer Mother        39's  . Breast cancer Maternal Grandmother   . Breast cancer Paternal Grandmother      Social History  Substance Use Topics  . Smoking status: Never Smoker  . Smokeless tobacco: Never Used  . Alcohol use No    Allergies as of 11/07/2016 - Review Complete 11/07/2016  Allergen Reaction Noted  . Bupropion  05/12/2016  . Calcium  05/12/2016  . Cefuroxime  05/12/2016  . Cephalexin  05/12/2016  . Clinoril [sulindac]  05/12/2016  . Codeine Hives 05/12/2016  . Duloxetine  05/12/2016  . Ezetimibe  05/12/2016  . Fenofibrate  05/12/2016  . Furosemide  05/12/2016  . Glipizide  05/12/2016  . Lorazepam  05/12/2016  . Lortab [hydrocodone-acetaminophen] Hives 05/12/2016  . Metaproterenol  05/12/2016  . Morphine and related  05/12/2016  . Nalbuphine  05/12/2016  . Naproxen  05/12/2016  . Norfloxacin  05/12/2016  . Rofecoxib  05/12/2016  . Sodium  05/12/2016  . Statins  05/12/2016  . Sulfa antibiotics  05/12/2016  . Tramadol  05/12/2016  . Trazodone and nefazodone  05/12/2016  . Doxycycline Rash 05/12/2016  . Iodine Rash 05/12/2016  . Ketoprofen Rash 05/12/2016  . Piroxicam Rash 05/12/2016  . Tolmetin Rash 05/12/2016    Review of Systems:    All systems reviewed and negative except where noted in HPI.   Physical Exam:  Vital signs in last 24 hours: Temp:  [97.8 F (36.6 C)-98 F (36.7 C)] 97.8 F (36.6 C) (07/19  0457) Pulse Rate:  [76-86] 76 (07/19 0457) Resp:  [16-20] 18 (07/19 0457) BP: (119-137)/(50-67) 119/50 (07/19 0457) SpO2:  [97 %-100 %] 100 % (07/19 0457) Weight:  [202 lb 12.8 oz (92 kg)] 202 lb 12.8 oz (92 kg) (07/18 1844) Last BM Date: 11/07/16 General:   Pleasant, cooperative in NAD Head:  Normocephalic and atraumatic. Eyes:   No icterus.   Conjunctiva pink. PERRLA. Ears:  Normal auditory acuity. Neck:  Supple; no masses or thyroidomegaly Lungs: Respirations even and unlabored. Lungs clear to auscultation bilaterally.   No wheezes, crackles, or rhonchi.  Heart:  Regular rate and rhythm;  Without murmur, clicks, rubs or gallops Abdomen:  Soft, nondistended, nontender. Normal bowel sounds. No appreciable masses or hepatomegaly.  No rebound or guarding.  Rectal:  Not performed. Neurologic:  Alert and oriented x3;  grossly normal neurologically. Skin:  Intact without significant lesions or rashes. Cervical Nodes:  No significant cervical adenopathy. Psych:  Alert and cooperative. Normal affect.  LAB RESULTS:  Recent Labs  11/07/16 1300 11/07/16 1914 11/08/16 0103 11/08/16 0659  WBC 8.5  --   --   --   HGB 14.0 13.5 13.2 13.3  HCT 40.9  --   --   --   PLT 202  --   --   --    BMET  Recent Labs  11/07/16 1300  NA 138  K 4.3  CL 104  CO2 25  GLUCOSE 159*  BUN 18  CREATININE 0.78  CALCIUM 9.2   LFT  Recent Labs  11/07/16 1300  PROT 7.0  ALBUMIN 4.0  AST 26  ALT 20  ALKPHOS 75  BILITOT 0.8   PT/INR  Recent Labs  11/07/16 1300  LABPROT 18.7*  INR 1.55    STUDIES: No results found.    Impression / Plan:   Gina Chambers is a 76 y.o. y/o female who is on coumadin for recurrent DVT's admitted with rectal bleeding . Hb at 13.3 grams . Coumadin has been held for now . Likely a hemorroidal bleed with minimal blood loss per labs but large in qty per history .  Plan   1. Colonoscopy at 7.30 am tomorrow  2. Monitor CBC  I have discussed alternative  options, risks & benefits,  which include, but are not limited to, bleeding, infection, perforation,respiratory complication & drug reaction.  The patient agrees with this plan & written consent will be obtained.      Thank you for involving me in the care of this patient.      LOS: 0 days   Jonathon Bellows, MD  11/08/2016, 12:41 PM

## 2016-11-08 NOTE — Progress Notes (Signed)
[facility] at [facility] NAME: Gina Chambers    MR#:  [id number]  DATE OF BIRTH:  12-19-1940  SUBJECTIVE:   Patient here due to rectal bleeding. Hemoglobin remained stable. Had one episode this morning. No abdominal pain, nausea or vomiting.   REVIEW OF SYSTEMS:    Review of Systems  Constitutional: Negative for chills and fever.  HENT: Negative for congestion and tinnitus.   Eyes: Negative for blurred vision and double vision.  Respiratory: Negative for cough, shortness of breath and wheezing.   Cardiovascular: Negative for chest pain, orthopnea and PND.  Gastrointestinal: Positive for blood in stool. Negative for abdominal pain, diarrhea, nausea and vomiting.  Genitourinary: Negative for dysuria and hematuria.  Neurological: Negative for dizziness, sensory change and focal weakness.  All other systems reviewed and are negative.   Nutrition: Clear Liquids Tolerating Diet: Yes Tolerating PT: Await Eval.      DRUG ALLERGIES:   Allergies  Allergen Reactions  . Bupropion     GI issues   . Calcium     Chest pain   . Cefuroxime     Swelling   . Cephalexin   . Clinoril [Sulindac]     GI  . Codeine Hives  . Duloxetine     Bleeding   . Ezetimibe     Joint pain   . Fenofibrate     Leg cramps  . Furosemide     Fluid retention   . Glipizide     Bloating   . Lorazepam     SI  . Lortab [Hydrocodone-Acetaminophen] Hives  . Metaproterenol     Palpitations   . Morphine And Related   . Nalbuphine     Rapid heart rate  flushing   . Naproxen     Numbness   . Norfloxacin     Urinary retention   . Rofecoxib   . Sodium   . Statins     Muscle pain   . Sulfa Antibiotics     Unknown   . Tramadol   . Trazodone And Nefazodone   . Doxycycline Rash  . Iodine Rash  . Ketoprofen Rash  . Piroxicam Rash  . Tolmetin Rash    VITALS:  Blood pressure 134/66, pulse 77, temperature 98 F (36.7 C), temperature source  Axillary, resp. rate 16, height 5\' 3"  (1.6 m), weight 92 kg (202 lb 12.8 oz), SpO2 100 %.  PHYSICAL EXAMINATION:   Physical Exam  GENERAL:  76 y.o.-year-old patient lying in bed in no acute distress.  EYES: Pupils equal, round, reactive to light and accommodation. No scleral icterus. Extraocular muscles intact.  HEENT: Head atraumatic, normocephalic. Oropharynx and nasopharynx clear.  NECK:  Supple, no jugular venous distention. No thyroid enlargement, no tenderness.  LUNGS: Normal breath sounds bilaterally, no wheezing, rales, rhonchi. No use of accessory muscles of respiration.  CARDIOVASCULAR: S1, S2 normal. No murmurs, rubs, or gallops.  ABDOMEN: Soft, nontender, nondistended. Bowel sounds present. No organomegaly or mass.  EXTREMITIES: No cyanosis, clubbing or edema b/l.    NEUROLOGIC: Cranial nerves II through XII are intact. No focal Motor or sensory deficits b/l.   PSYCHIATRIC: The patient is alert and oriented x 3.  SKIN: No obvious rash, lesion, or ulcer.    LABORATORY PANEL:   CBC  Recent Labs Lab 11/07/16 1300  11/08/16 0659  WBC 8.5  --   --   HGB 14.0  < > 13.3  HCT 40.9  --   --  PLT 202  --   --   < > = values in this interval not displayed. ------------------------------------------------------------------------------------------------------------------  Chemistries   Recent Labs Lab 11/07/16 1300  NA 138  K 4.3  CL 104  CO2 25  GLUCOSE 159*  BUN 18  CREATININE 0.78  CALCIUM 9.2  AST 26  ALT 20  ALKPHOS 75  BILITOT 0.8   ------------------------------------------------------------------------------------------------------------------  Cardiac Enzymes No results for input(s): TROPONINI in the last 168 hours. ------------------------------------------------------------------------------------------------------------------  RADIOLOGY:  No results found.   ASSESSMENT AND PLAN:   76 year old female with past medical history of Essential  Hypertension, hypothyroidism, diabetes, asthma, history of recurrent DVT presented to the hospital due to rectal bleeding.  1. Rectal bleeding-etiology unclear, suspected to be either hemorrhoidal versus diverticular in nature. -Hemoglobin is stable and patient has had no acute bleeding this afternoon. -Await further gastroenterology input. Likely will need colonoscopy tomorrow. Placed on clear liquid diet. -Continue to hold warfarin.  2. 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 Due to GI bleeding Coumadin is currently on hold. -Patient's last DVT was over a year ago and unclear if patient still needs to be on warfarin.  3. Hypothyroidism-continue Synthroid.  4. Restless leg syndrome-continue Requip.  5. Diabetes type 2 without complication-continue sliding scale insulin.  6. Essential hypertension-continue Bumex.   All the records are reviewed and case discussed with Care Management/Social Worker. Management plans discussed with the patient, family and they are in agreement.  CODE STATUS: Full code  DVT Prophylaxis: Ted's & SCD's.   TOTAL TIME TAKING CARE OF THIS PATIENT: 30 minutes.   POSSIBLE D/C IN 1-2 DAYS, DEPENDING ON CLINICAL CONDITION.   Henreitta Leber M.D on 11/08/2016 at 2:43 PM  Between 7am to 6pm - Pager - [phone]  After 6pm go to [email or web address] - [employer] [facility]  Office  [phone]  CC: Primary care physician; Kirk Ruths, MD

## 2016-11-08 NOTE — Care Management Obs Status (Signed)
[facility] NOTIFICATION   Patient Details  Name: MACAYLA EKDAHL MRN: [id number] Date of Birth: 05/10/1940   Medicare Observation Status Notification Given:  Yes    Beverly Sessions, RN 11/08/2016, 3:23 PM

## 2016-11-08 NOTE — Progress Notes (Signed)
Called Dr. Ara Kussmaul regarding 3 episodes of rectal bleeding.  Appropriate orders were placed.  Christene Slates  11/08/2016  12:59 AM

## 2016-11-08 NOTE — Progress Notes (Signed)
Lantus ordered; CBG trending low normal; consulted with Pharmacist, Marcelle Overlie; If CBG at 2200 is around 100, will hold Lantus. Barbaraann Faster, RN 7:52 PM 11/08/2016

## 2016-11-08 NOTE — Progress Notes (Signed)
Pt was placed back on CPAP.  The patient stated to the RN that the pressure was too high before.  I titrated the pressure from 8 to 4.5 for the patients comfort.

## 2016-11-08 NOTE — Plan of Care (Signed)
Problem: Nutrition: Goal: Adequate nutrition will be maintained Outcome: Not Progressing Patient is NPO.   

## 2016-11-08 NOTE — Progress Notes (Signed)
Patient has removed her CPAP mask.

## 2016-11-09 ENCOUNTER — Encounter: Payer: Self-pay | Admitting: Anesthesiology

## 2016-11-09 ENCOUNTER — Encounter
Admission: EM | Disposition: A | Discharge status: Home / Self Care | Payer: Self-pay | Source: Home / Self Care | Attending: Emergency Medicine

## 2016-11-09 ENCOUNTER — Observation Stay: Payer: Medicare Other | Admitting: Anesthesiology

## 2016-11-09 DIAGNOSIS — K5730 Diverticulosis of large intestine without perforation or abscess without bleeding: Secondary | ICD-10-CM | POA: Diagnosis not present

## 2016-11-09 DIAGNOSIS — D124 Benign neoplasm of descending colon: Secondary | ICD-10-CM | POA: Diagnosis not present

## 2016-11-09 DIAGNOSIS — K625 Hemorrhage of anus and rectum: Secondary | ICD-10-CM | POA: Diagnosis not present

## 2016-11-09 DIAGNOSIS — K640 First degree hemorrhoids: Secondary | ICD-10-CM | POA: Diagnosis not present

## 2016-11-09 HISTORY — PX: COLONOSCOPY WITH PROPOFOL: SHX5780

## 2016-11-09 LAB — HEMOGLOBIN: Hemoglobin: 12.3 g/dL (ref 12.0–16.0)

## 2016-11-09 LAB — GLUCOSE, CAPILLARY
Glucose-Capillary: 120 mg/dL — ABNORMAL HIGH (ref 65–99)
Glucose-Capillary: 118 mg/dL — ABNORMAL HIGH (ref 65–99)

## 2016-11-09 SURGERY — COLONOSCOPY WITH PROPOFOL
Anesthesia: General

## 2016-11-09 MED ORDER — PHENYLEPHRINE HCL 10 MG/ML IJ SOLN
INTRAMUSCULAR | Status: AC
  Filled 2016-11-09: qty 1

## 2016-11-09 MED ORDER — HYDROCORTISONE ACETATE 25 MG RE SUPP: 25.0000 mg | Freq: Two times a day (BID) | RECTAL | 0 refills | Status: DC

## 2016-11-09 MED ORDER — SODIUM CHLORIDE 0.9 % IV SOLN
INTRAVENOUS | Status: DC
  Administered 2016-11-09: 08:00:00 via INTRAVENOUS

## 2016-11-09 MED ORDER — LIDOCAINE HCL (PF) 2 % IJ SOLN
INTRAMUSCULAR | Status: AC
  Filled 2016-11-09: qty 2

## 2016-11-09 MED ORDER — PHENYLEPHRINE HCL 10 MG/ML IJ SOLN
INTRAMUSCULAR | Status: DC | PRN
  Administered 2016-11-09 (×2): 100 ug via INTRAVENOUS

## 2016-11-09 MED ORDER — EPHEDRINE SULFATE 50 MG/ML IJ SOLN
INTRAMUSCULAR | Status: AC
  Filled 2016-11-09: qty 1

## 2016-11-09 MED ORDER — HYDROCORTISONE ACETATE 25 MG RE SUPP
25.0000 mg | Freq: Two times a day (BID) | RECTAL | Status: DC
  Filled 2016-11-09 (×2): qty 1

## 2016-11-09 MED ORDER — PROPOFOL 10 MG/ML IV BOLUS
INTRAVENOUS | Status: DC | PRN
  Administered 2016-11-09: 60 mg via INTRAVENOUS

## 2016-11-09 MED ORDER — GLYCOPYRROLATE 0.2 MG/ML IJ SOLN
INTRAMUSCULAR | Status: AC
  Filled 2016-11-09: qty 1

## 2016-11-09 MED ORDER — LIDOCAINE HCL (CARDIAC) 20 MG/ML IV SOLN
INTRAVENOUS | Status: DC | PRN
  Administered 2016-11-09: 40 mg via INTRAVENOUS

## 2016-11-09 MED ORDER — PROPOFOL 500 MG/50ML IV EMUL
INTRAVENOUS | Status: DC | PRN
  Administered 2016-11-09: 150 ug/kg/min via INTRAVENOUS

## 2016-11-09 MED ORDER — SUCCINYLCHOLINE CHLORIDE 20 MG/ML IJ SOLN
INTRAMUSCULAR | Status: AC
  Filled 2016-11-09: qty 1

## 2016-11-09 MED ORDER — PROPOFOL 10 MG/ML IV BOLUS
INTRAVENOUS | Status: AC
  Filled 2016-11-09: qty 40

## 2016-11-09 NOTE — H&P (Signed)
Jonathon Bellows MD [address], [place], [place] [address] Phone: [phone] Fax : [phone]  Primary Care Physician:  Kirk Ruths, MD Primary Gastroenterologist:  Dr. Jonathon Bellows   Pre-Procedure History & Physical: HPI:  RMONI KEPLINGER is a 76 y.o. female is here for an colonoscopy.   Past Medical History:  Diagnosis Date  . Asthma   . Diabetes mellitus without complication ([place])   . Hypertension   . Thyroid disease     Past Surgical History:  Procedure Laterality Date  . CARDIAC SURGERY    . HERNIA REPAIR    . VERTICAL BANDED GASTROPLASTY      Prior to Admission medications   Medication Sig Start Date End Date Taking? Authorizing Provider  aspirin EC 81 MG tablet Take 81 mg by mouth daily.   Yes [provider]  B Complex-C (SUPER B COMPLEX PO) Take 1 tablet by mouth daily.   Yes [provider]  insulin glargine (LANTUS) 100 UNIT/ML injection Inject 8-10 Units into the skin at bedtime. 10 units in the morning and 10 units in the evening   Yes [provider]  insulin regular (NOVOLIN R,HUMULIN R) 250 units/2.71mL (100 units/mL) injection Inject 10 Units into the skin 3 (three) times daily before meals. Inject 10 units before meals per sliding scale.   Yes [provider]  Ipratropium-Albuterol (COMBIVENT) 20-100 MCG/ACT AERS respimat Inhale 1 puff into the lungs 4 (four) times daily as needed for wheezing or shortness of breath. May take additional inhalations prn, not to exceed 6 inhalations in 24 hours.   Yes [provider]  lansoprazole (PREVACID) 30 MG capsule Take 30 mg by mouth 2 (two) times daily.   Yes [provider]  levothyroxine (SYNTHROID, LEVOTHROID) 125 MCG tablet Take 125 mcg by mouth daily.   Yes [provider]  Magnesium 500 MG TABS Take 500 mg by mouth 3 (three) times daily.   Yes [provider]  metoprolol succinate (TOPROL-XL) 50 MG 24 hr tablet Take 50 mg by mouth  daily. Take with or immediately following a meal.   Yes [provider]  warfarin (COUMADIN) 5 MG tablet Take 5 mg by mouth daily.    Yes [provider]  benzonatate (TESSALON) 100 MG capsule Take 1 capsule (100 mg total) by mouth 3 (three) times daily. Patient not taking: Reported on 11/07/2016 05/14/16   Fritzi Mandes, MD  Azucena Freed Serrata (BOSWELLIA PO) Take 3 capsules by mouth 2 (two) times daily.    [provider]  Bromelains (BROMELAIN PO) Take 1 tablet by mouth 2 (two) times daily.    [provider]  bumetanide (BUMEX) 0.5 MG tablet Take 0.5-1 mg by mouth daily. Take 2 tablets (1mg ) and 1 tablet (0.5 mg) in the evening.    [provider]  guaiFENesin (ROBITUSSIN) 100 MG/5ML SOLN Take 5 mLs (100 mg total) by mouth every 4 (four) hours as needed for cough or to loosen phlegm. Patient not taking: Reported on 11/07/2016 05/14/16   Fritzi Mandes, MD  levofloxacin (LEVAQUIN) 500 MG tablet Take 1 tablet (500 mg total) by mouth daily. Patient not taking: Reported on 11/07/2016 05/14/16   Fritzi Mandes, MD  Multiple Vitamins-Minerals (PRESERVISION AREDS 2 PO) Take 1 tablet by mouth 2 (two) times daily.    [provider]  predniSONE (DELTASONE) 10 MG tablet Take 50 mg daily taper by 10 mg daily then stop Patient not taking: Reported on 11/07/2016 05/15/16   Fritzi Mandes,  MD  rOPINIRole (REQUIP) 0.5 MG tablet Take 0.5-1 mg by mouth at bedtime as needed.    [provider]  tolterodine (DETROL LA) 4 MG 24 hr capsule Take 4 mg by mouth daily.    [provider]    Allergies as of 11/07/2016 - Review Complete 11/07/2016  Allergen Reaction Noted  . Bupropion  05/12/2016  . Calcium  05/12/2016  . Cefuroxime  05/12/2016  . Cephalexin  05/12/2016  . Clinoril [sulindac]  05/12/2016  . Codeine Hives 05/12/2016  . Duloxetine  05/12/2016  . Ezetimibe  05/12/2016  . Fenofibrate  05/12/2016  . Furosemide  05/12/2016  . Glipizide  05/12/2016    . Lorazepam  05/12/2016  . Lortab [hydrocodone-acetaminophen] Hives 05/12/2016  . Metaproterenol  05/12/2016  . Morphine and related  05/12/2016  . Nalbuphine  05/12/2016  . Naproxen  05/12/2016  . Norfloxacin  05/12/2016  . Rofecoxib  05/12/2016  . Sodium  05/12/2016  . Statins  05/12/2016  . Sulfa antibiotics  05/12/2016  . Tramadol  05/12/2016  . Trazodone and nefazodone  05/12/2016  . Doxycycline Rash 05/12/2016  . Iodine Rash 05/12/2016  . Ketoprofen Rash 05/12/2016  . Piroxicam Rash 05/12/2016  . Tolmetin Rash 05/12/2016    Family History  Problem Relation Age of Onset  . Breast cancer Mother        71's  . Breast cancer Maternal Grandmother   . Breast cancer Paternal Grandmother     Social History   Social History  . Marital status: Single    Spouse name: N/A  . Number of children: N/A  . Years of education: N/A   Occupational History  . Not on file.   Social History Main Topics  . Smoking status: Never Smoker  . Smokeless tobacco: Never Used  . Alcohol use No  . Drug use: No  . Sexual activity: Not on file   Other Topics Concern  . Not on file   Social History Narrative  . No narrative on file    Review of Systems: See HPI, otherwise negative ROS  Physical Exam: BP (!) 145/72   Pulse 78   Temp 97.7 F (36.5 C) (Tympanic)   Resp 18   Ht 5\' 3"  (1.6 m)   Wt 202 lb 12.8 oz (92 kg)   SpO2 100%   BMI 35.92 kg/m  General:   Alert,  pleasant and cooperative in NAD Head:  Normocephalic and atraumatic. Neck:  Supple; no masses or thyromegaly. Lungs:  Clear throughout to auscultation.    Heart:  Regular rate and rhythm. Abdomen:  Soft, nontender and nondistended. Normal bowel sounds, without guarding, and without rebound.   Neurologic:  Alert and  oriented x4;  grossly normal neurologically.  Impression/Plan: BERLENE DIXSON is here for an colonoscopy to be performed for rectal bleeding   Risks, benefits, limitations, and alternatives  regarding  colonoscopy have been reviewed with the patient.  Questions have been answered.  All parties agreeable.   Jonathon Bellows, MD  11/09/2016, 7:41 AM

## 2016-11-09 NOTE — Discharge Summary (Signed)
[facility] NAME: Gina Chambers    MR#:  [id number]  DATE OF BIRTH:  Oct 11, 1940  DATE OF ADMISSION:  11/07/2016 ADMITTING PHYSICIAN: Demetrios Loll, MD  DATE OF DISCHARGE: 11/09/16  PRIMARY CARE PHYSICIAN: Kirk Ruths, MD    ADMISSION DIAGNOSIS:  Rectal bleeding [K62.5] Lower GI bleed [K92.2]  DISCHARGE DIAGNOSIS:  Lower GI/rectal bleed secondary to internal hemorrhoids.  SECONDARY DIAGNOSIS:   Past Medical History:  Diagnosis Date  . Asthma   . Diabetes mellitus without complication ([place])   . Hypertension   . Thyroid disease     HOSPITAL COURSE:   76 year old female with past medical history of Essential Hypertension, hypothyroidism, diabetes, asthma, history of recurrent DVT presented to the hospital due to rectal bleeding.  1. Rectal bleeding-due to internal hemorrhoids. Patient underwent colonoscopy -GI recommends Anusol suppository, high fiber diet. -Hemoglobin is stable and patient has had no acute bleeding this afternoon.  2. 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  -Patient's last DVT was over a year ago and unclear if patient still needs to be on warfarin. I have asked patient to address warfarin continuation by Dr. Ouida Sills since it's been over a year since she had her last DVT. For now I have asked her to resume her Coumadin dosing and get PT/INR checked with Dr. Tonette Bihari office. Patient voiced understanding.  3. Hypothyroidism-continue Synthroid.  4. Restless leg syndrome-continue Requip.  5. Diabetes type 2 without complication-continue sliding scale insulin.  6. Essential hypertension-continue Bumex.   Hemodynamically stable. Discharge home. CONSULTS OBTAINED:    DRUG ALLERGIES:   Allergies  Allergen Reactions  . Bupropion     GI issues   . Calcium     Chest pain   . Cefuroxime     Swelling   . Cephalexin   . Clinoril [Sulindac]     GI  . Codeine Hives  . Duloxetine      Bleeding   . Ezetimibe     Joint pain   . Fenofibrate     Leg cramps  . Furosemide     Fluid retention   . Glipizide     Bloating   . Lorazepam     SI  . Lortab [Hydrocodone-Acetaminophen] Hives  . Metaproterenol     Palpitations   . Morphine And Related   . Nalbuphine     Rapid heart rate  flushing   . Naproxen     Numbness   . Norfloxacin     Urinary retention   . Rofecoxib   . Sodium   . Statins     Muscle pain   . Sulfa Antibiotics     Unknown   . Tramadol   . Trazodone And Nefazodone   . Doxycycline Rash  . Iodine Rash  . Ketoprofen Rash  . Piroxicam Rash  . Tolmetin Rash    DISCHARGE MEDICATIONS:   Current Discharge Medication List    START taking these medications   Details  hydrocortisone (ANUSOL-HC) 25 MG suppository Place 1 suppository (25 mg total) rectally 2 (two) times daily. Qty: 14 suppository, Refills: 0      CONTINUE these medications which have NOT CHANGED   Details  aspirin EC 81 MG tablet Take 81 mg by mouth daily.    B Complex-C (SUPER B COMPLEX PO) Take 1 tablet by mouth daily.    insulin glargine (LANTUS) 100 UNIT/ML injection Inject 8-10 Units into the skin at bedtime. 10 units in  the morning and 10 units in the evening    insulin regular (NOVOLIN R,HUMULIN R) 250 units/2.39mL (100 units/mL) injection Inject 10 Units into the skin 3 (three) times daily before meals. Inject 10 units before meals per sliding scale.    Ipratropium-Albuterol (COMBIVENT) 20-100 MCG/ACT AERS respimat Inhale 1 puff into the lungs 4 (four) times daily as needed for wheezing or shortness of breath. May take additional inhalations prn, not to exceed 6 inhalations in 24 hours.    lansoprazole (PREVACID) 30 MG capsule Take 30 mg by mouth 2 (two) times daily.    levothyroxine (SYNTHROID, LEVOTHROID) 125 MCG tablet Take 125 mcg by mouth daily.    Magnesium 500 MG TABS Take 500 mg by mouth 3 (three) times daily.    metoprolol succinate (TOPROL-XL) 50  MG 24 hr tablet Take 50 mg by mouth daily. Take with or immediately following a meal.    warfarin (COUMADIN) 5 MG tablet Take 5 mg by mouth daily.     benzonatate (TESSALON) 100 MG capsule Take 1 capsule (100 mg total) by mouth 3 (three) times daily. Qty: 20 capsule, Refills: 0    Boswellia Serrata (BOSWELLIA PO) Take 3 capsules by mouth 2 (two) times daily.    Bromelains (BROMELAIN PO) Take 1 tablet by mouth 2 (two) times daily.    bumetanide (BUMEX) 0.5 MG tablet Take 0.5-1 mg by mouth daily. Take 2 tablets (1mg ) and 1 tablet (0.5 mg) in the evening.    guaiFENesin (ROBITUSSIN) 100 MG/5ML SOLN Take 5 mLs (100 mg total) by mouth every 4 (four) hours as needed for cough or to loosen phlegm. Qty: 1200 mL, Refills: 0    levofloxacin (LEVAQUIN) 500 MG tablet Take 1 tablet (500 mg total) by mouth daily. Qty: 5 tablet, Refills: 0    Multiple Vitamins-Minerals (PRESERVISION AREDS 2 PO) Take 1 tablet by mouth 2 (two) times daily.    predniSONE (DELTASONE) 10 MG tablet Take 50 mg daily taper by 10 mg daily then stop Qty: 15 tablet, Refills: 0    rOPINIRole (REQUIP) 0.5 MG tablet Take 0.5-1 mg by mouth at bedtime as needed.    tolterodine (DETROL LA) 4 MG 24 hr capsule Take 4 mg by mouth daily.        If you experience worsening of your admission symptoms, develop shortness of breath, life threatening emergency, suicidal or homicidal thoughts you must seek medical attention immediately by calling 911 or calling your MD immediately  if symptoms less severe.  You Must read complete instructions/literature along with all the possible adverse reactions/side effects for all the Medicines you take and that have been prescribed to you. Take any new Medicines after you have completely understood and accept all the possible adverse reactions/side effects.   Please note  You were cared for by a hospitalist during your hospital stay. If you have any questions about your discharge medications or the  care you received while you were in the hospital after you are discharged, you can call the unit and asked to speak with the hospitalist on call if the hospitalist that took care of you is not available. Once you are discharged, your primary care physician will handle any further medical issues. Please note that NO REFILLS for any discharge medications will be authorized once you are discharged, as it is imperative that you return to your primary care physician (or establish a relationship with a primary care physician if you do not have one) for your aftercare needs so that  they can reassess your need for medications and monitor your lab values. Today   SUBJECTIVE   Minimal rectal bleed. Patient does not report bowel movements to the nurse.  VITAL SIGNS:  Blood pressure 128/61, pulse 76, temperature (!) 96.8 F (36 C), temperature source Tympanic, resp. rate 14, height 5\' 3"  (1.6 m), weight 92 kg (202 lb 12.8 oz), SpO2 100 %.  I/O:   Intake/Output Summary (Last 24 hours) at 11/09/16 1134 Last data filed at 11/09/16 1245  Gross per 24 hour  Intake             2135 ml  Output              800 ml  Net             1335 ml    PHYSICAL EXAMINATION:  GENERAL:  76 y.o.-year-old patient lying in the bed with no acute distress.  EYES: Pupils equal, round, reactive to light and accommodation. No scleral icterus. Extraocular muscles intact.  HEENT: Head atraumatic, normocephalic. Oropharynx and nasopharynx clear.  NECK:  Supple, no jugular venous distention. No thyroid enlargement, no tenderness.  LUNGS: Normal breath sounds bilaterally, no wheezing, rales,rhonchi or crepitation. No use of accessory muscles of respiration.  CARDIOVASCULAR: S1, S2 normal. No murmurs, rubs, or gallops.  ABDOMEN: Soft, non-tender, non-distended. Bowel sounds present. No organomegaly or mass.  EXTREMITIES: No pedal edema, cyanosis, or clubbing.  NEUROLOGIC: Cranial nerves II through XII are intact. Muscle strength 5/5  in all extremities. Sensation intact. Gait not checked.  PSYCHIATRIC: The patient is alert and oriented x 3.  SKIN: No obvious rash, lesion, or ulcer.   DATA REVIEW:   CBC   Recent Labs Lab 11/07/16 1300  11/09/16 0552  WBC 8.5  --   --   HGB 14.0  < > 12.3  HCT 40.9  --   --   PLT 202  --   --   < > = values in this interval not displayed.  Chemistries   Recent Labs Lab 11/07/16 1300  NA 138  K 4.3  CL 104  CO2 25  GLUCOSE 159*  BUN 18  CREATININE 0.78  CALCIUM 9.2  AST 26  ALT 20  ALKPHOS 75  BILITOT 0.8    Microbiology Results   No results found for this or any previous visit (from the past 240 hour(s)).  RADIOLOGY:  No results found.   Management plans discussed with the patient, family and they are in agreement.  CODE STATUS:     Code Status Orders        Start     Ordered   11/07/16 1852  Full code  Continuous     11/07/16 1851    Code Status History    Date Active Date Inactive Code Status Order ID Comments User Context   05/12/2016  8:34 PM 05/14/2016  4:17 PM Full Code [id number]  Theodoro Grist, MD Inpatient      TOTAL TIME TAKING CARE OF THIS PATIENT: 40 minutes.    Dailon Sheeran M.D on 11/09/2016 at 11:34 AM  Between 7am to 6pm - Pager - [phone] After 6pm go to [email or web address] - password [email or web address] [facility]  Office  [phone]  CC: Primary care physician; Kirk Ruths, MD

## 2016-11-09 NOTE — Anesthesia Procedure Notes (Signed)
Date/Time: 11/09/2016 7:48 AM Performed by: Doreen Salvage Pre-anesthesia Checklist: Patient identified, Emergency Drugs available, Suction available and Patient being monitored Patient Re-evaluated:Patient Re-evaluated prior to induction Oxygen Delivery Method: Nasal cannula Induction Type: IV induction Dental Injury: Teeth and Oropharynx as per pre-operative assessment  Comments: Nasal cannula with etCO2 monitoring

## 2016-11-09 NOTE — Transfer of Care (Signed)
Immediate Anesthesia Transfer of Care Note  Patient: Gina Chambers  Procedure(s) Performed: Procedure(s): COLONOSCOPY WITH PROPOFOL (N/A)  Patient Location: PACU  Anesthesia Type:General  Level of Consciousness: sedated  Airway & Oxygen Therapy: Patient Spontanous Breathing and Patient connected to face mask oxygen  Post-op Assessment: Report given to RN and Post -op Vital signs reviewed and stable  Post vital signs: Reviewed and stable  Last Vitals:  Vitals:   11/09/16 0713 11/09/16 0810  BP: (!) 145/72 (!) 94/45  Pulse: 78 71  Resp: 18 (!) 22  Temp: 36.5 C (!) 36 C    Complications: No apparent anesthesia complications

## 2016-11-09 NOTE — Op Note (Signed)
[facility] Patient Name: Gina Chambers Procedure Date: 11/09/2016 7:44 AM MRN: [id number] Account #: [id number] Date of Birth: [birth year] Admit Type: Inpatient Age: 76 Room: [facility] ENDO ROOM 4 Gender: Female Note Status: Finalized Procedure:            Colonoscopy Indications:          Rectal bleeding Providers:            Jonathon Bellows MD, MD Medicines:            Monitored Anesthesia Care Complications:        No immediate complications. Procedure:            Pre-Anesthesia Assessment:                       - Prior to the procedure, a History and Physical was                        performed, and patient medications, allergies and                        sensitivities were reviewed. The patient's tolerance of                        previous anesthesia was reviewed.                       - The risks and benefits of the procedure and the                        sedation options and risks were discussed with the                        patient. All questions were answered and informed                        consent was obtained.                       - ASA Grade Assessment: III - A patient with severe                        systemic disease.                       After obtaining informed consent, the colonoscope was                        passed under direct vision. Throughout the procedure,                        the patient's blood pressure, pulse, and oxygen                        saturations were monitored continuously. The                        Colonoscope was introduced through the and advanced to                        the the cecum, identified by the appendiceal orifice,  IC valve and transillumination. The colonoscopy was                        performed with ease. The patient tolerated the                        procedure well. The quality of the bowel preparation                        was adequate to identify  polyps. Findings:      The perianal and digital rectal examinations were normal.      A 3 mm polyp was found in the descending colon. The polyp was sessile.       The polyp was removed with a cold biopsy forceps. Resection and       retrieval were complete.      Non-bleeding internal hemorrhoids were found during retroflexion. The       hemorrhoids were medium-sized and Grade I (internal hemorrhoids that do       not prolapse).      A few small-mouthed diverticula were found in the entire colon.      The exam was otherwise without abnormality on direct and retroflexion       views. Impression:           - One 3 mm polyp in the descending colon, removed with                        a cold biopsy forceps. Resected and retrieved.                       - Non-bleeding internal hemorrhoids.                       - Diverticulosis in the entire examined colon.                       - The examination was otherwise normal on direct and                        retroflexion views. Recommendation:       - Return patient to hospital ward for possible                        discharge same day.                       - Advance diet as tolerated.                       - Continue present medications.                       - Await pathology results.                       - Repeat colonoscopy for surveillance based on                        pathology results.                       - 1. Rectal bleeding most likely from inflammed  internal hemorroids                       2. Suggest sitz bath, stool softners, high fiber diet                        and anusol supp for 7 days , if still no better to see                        a colorectal surgeon for hemorroidal treatment                       I will sign out please call with questions Procedure Code(s):    --- Professional ---                       [phone], Colonoscopy, flexible; with biopsy, single or                         multiple Diagnosis Code(s):    --- Professional ---                       D12.4, Benign neoplasm of descending colon                       K64.0, First degree hemorrhoids                       K62.5, Hemorrhage of anus and rectum                       K57.30, Diverticulosis of large intestine without                        perforation or abscess without bleeding CPT copyright 2016 American Medical Association. All rights reserved. The codes documented in this report are preliminary and upon coder review may  be revised to meet current compliance requirements. Jonathon Bellows, MD Jonathon Bellows MD, MD 11/09/2016 8:08:13 AM This report has been signed electronically. Number of Addenda: 0 Note Initiated On: 11/09/2016 7:44 AM Scope Withdrawal Time: 0 hours 10 minutes 57 seconds  Total Procedure Duration: 0 hours 15 minutes 38 seconds       [facility]

## 2016-11-09 NOTE — Progress Notes (Signed)
Patient resting in bed. Awaiting ride from daughter who gets off after 1500

## 2016-11-09 NOTE — Progress Notes (Signed)
Currently patient is sleeping soundly. Will continue to monitor patient to end of shift.

## 2016-11-09 NOTE — Anesthesia Post-op Follow-up Note (Cosign Needed)
Anesthesia QCDR form completed.        

## 2016-11-09 NOTE — Anesthesia Postprocedure Evaluation (Signed)
Anesthesia Post Note  Patient: Gina Chambers  Procedure(s) Performed: Procedure(s) (LRB): COLONOSCOPY WITH PROPOFOL (N/A)  Patient location during evaluation: Endoscopy Anesthesia Type: General Level of consciousness: awake and alert Pain management: pain level controlled Vital Signs Assessment: post-procedure vital signs reviewed and stable Respiratory status: spontaneous breathing, nonlabored ventilation, respiratory function stable and patient connected to nasal cannula oxygen Cardiovascular status: blood pressure returned to baseline and stable Postop Assessment: no signs of nausea or vomiting Anesthetic complications: no     Last Vitals:  Vitals:   11/09/16 0830 11/09/16 0840  BP: 126/65 137/72  Pulse: 73 70  Resp: 18 14  Temp:      Last Pain:  Vitals:   11/09/16 0810  TempSrc: Tympanic  PainSc:                  Precious Haws Shatia Sindoni

## 2016-11-09 NOTE — Anesthesia Preprocedure Evaluation (Signed)
Anesthesia Evaluation  Patient identified by MRN, date of birth, ID band Patient awake    Reviewed: Allergy & Precautions, H&P , NPO status , Patient's Chart, lab work & pertinent test results  History of Anesthesia Complications Negative for: history of anesthetic complications  Airway Mallampati: III  TM Distance: <3 FB Neck ROM: limited    Dental  (+) Poor Dentition, Chipped, Caps   Pulmonary asthma , sleep apnea , pneumonia, COPD,           Cardiovascular Exercise Tolerance: Good hypertension, (-) angina(-) Past MI and (-) DOE      Neuro/Psych negative neurological ROS  negative psych ROS   GI/Hepatic negative GI ROS, Neg liver ROS,   Endo/Other  diabetes, Type 2  Renal/GU negative Renal ROS  negative genitourinary   Musculoskeletal   Abdominal   Peds  Hematology negative hematology ROS (+)   Anesthesia Other Findings Past Medical History: No date: Asthma No date: Diabetes mellitus without complication ([facility]) No date: Hypertension No date: Thyroid disease  Past Surgical History: No date: CARDIAC SURGERY No date: HERNIA REPAIR No date: VERTICAL BANDED GASTROPLASTY  BMI    Body Mass Index:  35.92 kg/m      Reproductive/Obstetrics negative OB ROS                             Anesthesia Physical Anesthesia Plan  ASA: III  Anesthesia Plan: General   Post-op Pain Management:    Induction: Intravenous  PONV Risk Score and Plan:   Airway Management Planned: Natural Airway and Nasal Cannula  Additional Equipment:   Intra-op Plan:   Post-operative Plan:   Informed Consent: I have reviewed the patients History and Physical, chart, labs and discussed the procedure including the risks, benefits and alternatives for the proposed anesthesia with the patient or authorized representative who has indicated his/her understanding and acceptance.   Dental Advisory Given  Plan  Discussed with: Anesthesiologist, CRNA and Surgeon  Anesthesia Plan Comments: (Patient consented for risks of anesthesia including but not limited to:  - adverse reactions to medications - risk of intubation if required - damage to teeth, lips or other oral mucosa - sore throat or hoarseness - Damage to heart, brain, lungs or loss of life  Patient voiced understanding.)        Anesthesia Quick Evaluation

## 2016-11-09 NOTE — Discharge Instructions (Signed)
Patient to discuss with Dr Ouida Sills PCP regarding warfarin continuation

## 2016-11-10 LAB — TYPE AND SCREEN
ABO/RH(D): A NEG
Antibody Identification: REACTIVE
Antibody Screen: POSITIVE
DAT, IgG: NEGATIVE
DAT, complement: NEGATIVE
Unit division: 0
Unit division: 0

## 2016-11-10 LAB — BPAM RBC
Blood Product Expiration Date: 201808162359
Blood Product Expiration Date: 201808172359
Unit Type and Rh: 9500
Unit Type and Rh: 9500

## 2016-11-12 ENCOUNTER — Encounter: Payer: Self-pay | Admitting: Gastroenterology

## 2016-11-13 LAB — SURGICAL PATHOLOGY

## 2016-11-20 ENCOUNTER — Telehealth: Payer: Self-pay

## 2016-11-20 NOTE — Telephone Encounter (Signed)
LVM for patient callback for results per Dr. Vicente Males.   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 At 41 she can discuss options if interested to pursue screening

## 2016-11-20 NOTE — Telephone Encounter (Signed)
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 At 45 she can discuss options if interested to pursue screening

## 2017-03-05 ENCOUNTER — Encounter: Payer: Self-pay | Admitting: *Deleted

## 2017-03-07 NOTE — Discharge Instructions (Signed)

## 2017-03-11 ENCOUNTER — Ambulatory Visit: Payer: Medicare Other | Admitting: Anesthesiology

## 2017-03-11 ENCOUNTER — Ambulatory Visit
Admission: RE | Admit: 2017-03-11 | Discharge: 2017-03-11 | Disposition: A | Discharge status: Home / Self Care | Payer: Medicare Other | Source: Ambulatory Visit | Attending: Ophthalmology | Admitting: Ophthalmology

## 2017-03-11 ENCOUNTER — Encounter
Admission: RE | Disposition: A | Discharge status: Home / Self Care | Payer: Self-pay | Source: Ambulatory Visit | Attending: Ophthalmology

## 2017-03-11 DIAGNOSIS — H2511 Age-related nuclear cataract, right eye: Secondary | ICD-10-CM | POA: Diagnosis present

## 2017-03-11 DIAGNOSIS — I509 Heart failure, unspecified: Secondary | ICD-10-CM | POA: Diagnosis not present

## 2017-03-11 DIAGNOSIS — I10 Essential (primary) hypertension: Secondary | ICD-10-CM | POA: Insufficient documentation

## 2017-03-11 DIAGNOSIS — Z794 Long term (current) use of insulin: Secondary | ICD-10-CM | POA: Insufficient documentation

## 2017-03-11 DIAGNOSIS — Z7901 Long term (current) use of anticoagulants: Secondary | ICD-10-CM | POA: Diagnosis not present

## 2017-03-11 DIAGNOSIS — J449 Chronic obstructive pulmonary disease, unspecified: Secondary | ICD-10-CM | POA: Insufficient documentation

## 2017-03-11 DIAGNOSIS — Z86718 Personal history of other venous thrombosis and embolism: Secondary | ICD-10-CM | POA: Diagnosis not present

## 2017-03-11 DIAGNOSIS — Z9889 Other specified postprocedural states: Secondary | ICD-10-CM | POA: Insufficient documentation

## 2017-03-11 DIAGNOSIS — E1151 Type 2 diabetes mellitus with diabetic peripheral angiopathy without gangrene: Secondary | ICD-10-CM | POA: Diagnosis not present

## 2017-03-11 DIAGNOSIS — E079 Disorder of thyroid, unspecified: Secondary | ICD-10-CM | POA: Diagnosis not present

## 2017-03-11 DIAGNOSIS — Z7951 Long term (current) use of inhaled steroids: Secondary | ICD-10-CM | POA: Insufficient documentation

## 2017-03-11 DIAGNOSIS — Z7982 Long term (current) use of aspirin: Secondary | ICD-10-CM | POA: Insufficient documentation

## 2017-03-11 DIAGNOSIS — Z951 Presence of aortocoronary bypass graft: Secondary | ICD-10-CM | POA: Insufficient documentation

## 2017-03-11 DIAGNOSIS — Z6836 Body mass index (BMI) 36.0-36.9, adult: Secondary | ICD-10-CM | POA: Diagnosis not present

## 2017-03-11 DIAGNOSIS — Z79899 Other long term (current) drug therapy: Secondary | ICD-10-CM | POA: Diagnosis not present

## 2017-03-11 DIAGNOSIS — K219 Gastro-esophageal reflux disease without esophagitis: Secondary | ICD-10-CM | POA: Diagnosis not present

## 2017-03-11 DIAGNOSIS — E1136 Type 2 diabetes mellitus with diabetic cataract: Secondary | ICD-10-CM | POA: Diagnosis not present

## 2017-03-11 DIAGNOSIS — Z952 Presence of prosthetic heart valve: Secondary | ICD-10-CM | POA: Diagnosis not present

## 2017-03-11 DIAGNOSIS — I110 Hypertensive heart disease with heart failure: Secondary | ICD-10-CM | POA: Insufficient documentation

## 2017-03-11 DIAGNOSIS — G4730 Sleep apnea, unspecified: Secondary | ICD-10-CM | POA: Insufficient documentation

## 2017-03-11 DIAGNOSIS — Z79890 Hormone replacement therapy (postmenopausal): Secondary | ICD-10-CM | POA: Diagnosis not present

## 2017-03-11 HISTORY — DX: Chronic obstructive pulmonary disease, unspecified: J44.9

## 2017-03-11 HISTORY — DX: Heart failure, unspecified: I50.9

## 2017-03-11 HISTORY — PX: CATARACT EXTRACTION W/PHACO: SHX586

## 2017-03-11 HISTORY — DX: Nausea with vomiting, unspecified: R11.2

## 2017-03-11 HISTORY — DX: Nausea with vomiting, unspecified: Z98.890

## 2017-03-11 HISTORY — DX: Dizziness and giddiness: R42

## 2017-03-11 HISTORY — DX: Polyneuropathy, unspecified: G62.9

## 2017-03-11 HISTORY — DX: Acute embolism and thrombosis of unspecified deep veins of unspecified lower extremity: I82.409

## 2017-03-11 HISTORY — DX: Other specified chronic obstructive pulmonary disease: J44.89

## 2017-03-11 HISTORY — DX: Sleep apnea, unspecified: G47.30

## 2017-03-11 HISTORY — DX: Gastro-esophageal reflux disease without esophagitis: K21.9

## 2017-03-11 HISTORY — DX: Family history of other specified conditions: Z84.89

## 2017-03-11 LAB — GLUCOSE, CAPILLARY
GLUCOSE-CAPILLARY: 115 mg/dL — AB (ref 65–99)
Glucose-Capillary: 111 mg/dL — ABNORMAL HIGH (ref 65–99)

## 2017-03-11 SURGERY — PHACOEMULSIFICATION, CATARACT, WITH IOL INSERTION
Anesthesia: Monitor Anesthesia Care | Laterality: Right | Wound class: Clean

## 2017-03-11 MED ORDER — LIDOCAINE HCL (PF) 2 % IJ SOLN
INTRAMUSCULAR | Status: DC | PRN
  Administered 2017-03-11: 1 mL via INTRAOCULAR

## 2017-03-11 MED ORDER — EPINEPHRINE PF 1 MG/ML IJ SOLN
INTRAMUSCULAR | Status: DC | PRN
  Administered 2017-03-11: 96 mL via OPHTHALMIC

## 2017-03-11 MED ORDER — POLYMYXIN B-TRIMETHOPRIM 10000-0.1 UNIT/ML-% OP SOLN
OPHTHALMIC | Status: DC | PRN
  Administered 2017-03-11: 2 [drp] via OPHTHALMIC

## 2017-03-11 MED ORDER — MIDAZOLAM HCL 2 MG/2ML IJ SOLN
INTRAMUSCULAR | Status: DC | PRN
  Administered 2017-03-11: 2 mg via INTRAVENOUS

## 2017-03-11 MED ORDER — ONDANSETRON HCL 4 MG/2ML IJ SOLN
INTRAMUSCULAR | Status: DC | PRN
  Administered 2017-03-11: 4 mg via INTRAVENOUS

## 2017-03-11 MED ORDER — TETRACAINE 0.5 % OP SOLN OPTIME - NO CHARGE
OPHTHALMIC | Status: DC | PRN
  Administered 2017-03-11: 2 [drp] via OPHTHALMIC

## 2017-03-11 MED ORDER — ARMC OPHTHALMIC DILATING DROPS
1.0000 "application " | OPHTHALMIC | Status: DC | PRN
  Administered 2017-03-11 (×3): 1 via OPHTHALMIC

## 2017-03-11 MED ORDER — SODIUM HYALURONATE 23 MG/ML IO SOLN
INTRAOCULAR | Status: DC | PRN
  Administered 2017-03-11: 0.6 mL via INTRAOCULAR

## 2017-03-11 MED ORDER — LACTATED RINGERS IV SOLN: INTRAVENOUS | Status: DC

## 2017-03-11 MED ORDER — SODIUM HYALURONATE 10 MG/ML IO SOLN
INTRAOCULAR | Status: DC | PRN
  Administered 2017-03-11: 0.55 mL via INTRAOCULAR

## 2017-03-11 SURGICAL SUPPLY — 16 items
CANNULA ANT/CHMB 27GA (MISCELLANEOUS) ×3 IMPLANT
DISSECTOR HYDRO NUCLEUS 50X22 (MISCELLANEOUS) ×3 IMPLANT
GLOVE BIO SURGEON STRL SZ8 (GLOVE) ×3 IMPLANT
GLOVE SURG LX 7.5 STRW (GLOVE) ×2
GLOVE SURG LX STRL 7.5 STRW (GLOVE) ×1 IMPLANT
GOWN STRL REUS W/ TWL LRG LVL3 (GOWN DISPOSABLE) ×2 IMPLANT
GOWN STRL REUS W/TWL LRG LVL3 (GOWN DISPOSABLE) ×4
LENS IOL TECNIS ITEC 22.0 (Intraocular Lens) ×3 IMPLANT
MARKER SKIN DUAL TIP RULER LAB (MISCELLANEOUS) ×3 IMPLANT
PACK CATARACT (MISCELLANEOUS) ×3 IMPLANT
PACK DR. KING ARMS (PACKS) ×3 IMPLANT
PACK EYE AFTER SURG (MISCELLANEOUS) ×3 IMPLANT
SYR 3ML LL SCALE MARK (SYRINGE) ×3 IMPLANT
SYR TB 1ML LUER SLIP (SYRINGE) ×3 IMPLANT
WATER STERILE IRR 500ML POUR (IV SOLUTION) ×3 IMPLANT
WIPE NON LINTING 3.25X3.25 (MISCELLANEOUS) ×3 IMPLANT

## 2017-03-11 NOTE — Anesthesia Procedure Notes (Signed)
Procedure Name: MAC Performed by: Tasia Liz, CRNA Pre-anesthesia Checklist: Patient identified, Emergency Drugs available, Suction available, Timeout performed and Patient being monitored Patient Re-evaluated:Patient Re-evaluated prior to induction Oxygen Delivery Method: Nasal cannula Placement Confirmation: positive ETCO2       

## 2017-03-11 NOTE — Anesthesia Postprocedure Evaluation (Signed)
Anesthesia Post Note  Patient: Gina Chambers  Procedure(s) Performed: CATARACT EXTRACTION PHACO AND INTRAOCULAR LENS PLACEMENT (IOC)  RIGHT DIABETIC (Right )  Patient location during evaluation: PACU Anesthesia Type: MAC Level of consciousness: awake and alert and oriented Pain management: satisfactory to patient Vital Signs Assessment: post-procedure vital signs reviewed and stable Respiratory status: spontaneous breathing, nonlabored ventilation and respiratory function stable Cardiovascular status: blood pressure returned to baseline and stable Postop Assessment: Adequate PO intake and No signs of nausea or vomiting Anesthetic complications: no    Raliegh Ip

## 2017-03-11 NOTE — Anesthesia Preprocedure Evaluation (Signed)
Anesthesia Evaluation  Patient identified by MRN, date of birth, ID band Patient awake    Reviewed: Allergy & Precautions, H&P , NPO status , Patient's Chart, lab work & pertinent test results  History of Anesthesia Complications (+) PONV and history of anesthetic complications  Airway Mallampati: II  TM Distance: >3 FB Neck ROM: full    Dental no notable dental hx.    Pulmonary sleep apnea , COPD,  COPD inhaler,    Pulmonary exam normal breath sounds clear to auscultation       Cardiovascular hypertension, +CHF  Normal cardiovascular exam Rhythm:regular Rate:Normal     Neuro/Psych    GI/Hepatic GERD  ,  Endo/Other  diabetes, Insulin DependentMorbid obesity  Renal/GU      Musculoskeletal   Abdominal   Peds  Hematology   Anesthesia Other Findings   Reproductive/Obstetrics                             Anesthesia Physical Anesthesia Plan  ASA: III  Anesthesia Plan: MAC   Post-op Pain Management:    Induction:   PONV Risk Score and Plan: 3 and Midazolam  Airway Management Planned:   Additional Equipment:   Intra-op Plan:   Post-operative Plan:   Informed Consent: I have reviewed the patients History and Physical, chart, labs and discussed the procedure including the risks, benefits and alternatives for the proposed anesthesia with the patient or authorized representative who has indicated his/her understanding and acceptance.     Plan Discussed with: CRNA  Anesthesia Plan Comments:         Anesthesia Quick Evaluation

## 2017-03-11 NOTE — H&P (Signed)
The History and Physical notes are on paper, have been signed, and are to be scanned.   I have examined the patient and there are no changes to the H&P.   Gina Chambers 03/11/2017 7:18 AM

## 2017-03-11 NOTE — Op Note (Signed)
OPERATIVE NOTE  KENZLEY KE [id number] 03/11/2017   PREOPERATIVE DIAGNOSIS:  Nuclear sclerotic cataract right eye.  H25.11   POSTOPERATIVE DIAGNOSIS:    Nuclear sclerotic cataract right eye.     PROCEDURE:  Phacoemusification with posterior chamber intraocular lens placement of the right eye   LENS:   Implant Name Type Inv. Item Serial No. Manufacturer Lot No. LRB No. Used  LENS IOL DIOP 22.0 - [id number] Intraocular Lens LENS IOL DIOP 22.0 [id number]  Right 1       PCB00+22.0   ULTRASOUND TIME: 0 minutes 59 seconds.  CDE 10.34   SURGEON:  Benay Pillow, MD, MPH  ANESTHESIOLOGIST: Anesthesiologist: Ronelle Nigh, MD CRNA: Mayme Genta, CRNA   ANESTHESIA:  Topical with tetracaine drops augmented with 1% preservative-free intracameral lidocaine.  ESTIMATED BLOOD LOSS: less than 1 mL.   COMPLICATIONS:  None.   DESCRIPTION OF PROCEDURE:  The patient was identified in the holding room and transported to the operating room and placed in the supine position under the operating microscope.  The right eye was identified as the operative eye and it was prepped and draped in the usual sterile ophthalmic fashion.   A 1.0 millimeter clear-corneal paracentesis was made at the 10:30 position. 0.5 ml of preservative-free 1% lidocaine with epinephrine was injected into the anterior chamber.  The anterior chamber was filled with Healon 5 viscoelastic.  A 2.4 millimeter keratome was used to make a near-clear corneal incision at the 8:00 position.  A curvilinear capsulorrhexis was made with a cystotome and capsulorrhexis forceps.  Balanced salt solution was used to hydrodissect and hydrodelineate the nucleus.  The patient coughed several times during the surgery (with verbal warning from the patient).   Phacoemulsification was then used in stop and chop fashion to remove the lens nucleus and epinucleus.  The remaining cortex was then removed using the irrigation and aspiration handpiece.  Healon was then placed into the capsular bag to distend it for lens placement.  A lens was then injected into the capsular bag.  The remaining viscoelastic was aspirated.   Wounds were hydrated with balanced salt solution.  The anterior chamber was inflated to a physiologic pressure with balanced salt solution.   No Intracameral antibiotics were used due to patient allergies.   No wound leaks were noted.   Polytrim drops were placed on the eye. Emycin ointment was placed on the eye and a patch and shield for patient comfort and because of the coughing and patient reported risk of emesis post anesthesia..  The patient was taken to the recovery room in stable condition without complications of anesthesia or surgery  Benay Pillow 03/11/2017, 8:10 AM

## 2017-03-11 NOTE — Transfer of Care (Signed)
Immediate Anesthesia Transfer of Care Note  Patient: Gina Chambers  Procedure(s) Performed: CATARACT EXTRACTION PHACO AND INTRAOCULAR LENS PLACEMENT (IOC)  RIGHT DIABETIC (Right )  Patient Location: PACU  Anesthesia Type: MAC  Level of Consciousness: awake, alert  and patient cooperative  Airway and Oxygen Therapy: Patient Spontanous Breathing and Patient connected to supplemental oxygen  Post-op Assessment: Post-op Vital signs reviewed, Patient's Cardiovascular Status Stable, Respiratory Function Stable, Patent Airway and No signs of Nausea or vomiting  Post-op Vital Signs: Reviewed and stable  Complications: No apparent anesthesia complications

## 2017-03-12 ENCOUNTER — Encounter: Payer: Self-pay | Admitting: Ophthalmology

## 2017-03-26 ENCOUNTER — Encounter: Payer: Self-pay | Admitting: *Deleted

## 2017-03-29 NOTE — Discharge Instructions (Signed)

## 2017-04-02 ENCOUNTER — Ambulatory Visit: Payer: Medicare Other | Admitting: Anesthesiology

## 2017-04-02 ENCOUNTER — Ambulatory Visit
Admission: RE | Admit: 2017-04-02 | Discharge: 2017-04-02 | Disposition: A | Discharge status: Home / Self Care | Payer: Medicare Other | Source: Ambulatory Visit | Attending: Ophthalmology | Admitting: Ophthalmology

## 2017-04-02 ENCOUNTER — Encounter
Admission: RE | Disposition: A | Discharge status: Home / Self Care | Payer: Self-pay | Source: Ambulatory Visit | Attending: Ophthalmology

## 2017-04-02 DIAGNOSIS — J449 Chronic obstructive pulmonary disease, unspecified: Secondary | ICD-10-CM | POA: Insufficient documentation

## 2017-04-02 DIAGNOSIS — Z91041 Radiographic dye allergy status: Secondary | ICD-10-CM | POA: Insufficient documentation

## 2017-04-02 DIAGNOSIS — Z9849 Cataract extraction status, unspecified eye: Secondary | ICD-10-CM | POA: Insufficient documentation

## 2017-04-02 DIAGNOSIS — Z7982 Long term (current) use of aspirin: Secondary | ICD-10-CM | POA: Insufficient documentation

## 2017-04-02 DIAGNOSIS — H2512 Age-related nuclear cataract, left eye: Secondary | ICD-10-CM | POA: Insufficient documentation

## 2017-04-02 DIAGNOSIS — Z794 Long term (current) use of insulin: Secondary | ICD-10-CM | POA: Insufficient documentation

## 2017-04-02 DIAGNOSIS — Z951 Presence of aortocoronary bypass graft: Secondary | ICD-10-CM | POA: Insufficient documentation

## 2017-04-02 DIAGNOSIS — Z886 Allergy status to analgesic agent status: Secondary | ICD-10-CM | POA: Diagnosis not present

## 2017-04-02 DIAGNOSIS — Z86718 Personal history of other venous thrombosis and embolism: Secondary | ICD-10-CM | POA: Diagnosis not present

## 2017-04-02 DIAGNOSIS — E1140 Type 2 diabetes mellitus with diabetic neuropathy, unspecified: Secondary | ICD-10-CM | POA: Insufficient documentation

## 2017-04-02 DIAGNOSIS — Z7901 Long term (current) use of anticoagulants: Secondary | ICD-10-CM | POA: Diagnosis not present

## 2017-04-02 DIAGNOSIS — Z885 Allergy status to narcotic agent status: Secondary | ICD-10-CM | POA: Diagnosis not present

## 2017-04-02 DIAGNOSIS — Z888 Allergy status to other drugs, medicaments and biological substances status: Secondary | ICD-10-CM | POA: Diagnosis not present

## 2017-04-02 DIAGNOSIS — Z882 Allergy status to sulfonamides status: Secondary | ICD-10-CM | POA: Insufficient documentation

## 2017-04-02 DIAGNOSIS — Z952 Presence of prosthetic heart valve: Secondary | ICD-10-CM | POA: Diagnosis not present

## 2017-04-02 DIAGNOSIS — E7800 Pure hypercholesterolemia, unspecified: Secondary | ICD-10-CM | POA: Diagnosis not present

## 2017-04-02 DIAGNOSIS — E1136 Type 2 diabetes mellitus with diabetic cataract: Secondary | ICD-10-CM | POA: Diagnosis present

## 2017-04-02 DIAGNOSIS — G4730 Sleep apnea, unspecified: Secondary | ICD-10-CM | POA: Insufficient documentation

## 2017-04-02 DIAGNOSIS — Z9889 Other specified postprocedural states: Secondary | ICD-10-CM | POA: Diagnosis not present

## 2017-04-02 DIAGNOSIS — E039 Hypothyroidism, unspecified: Secondary | ICD-10-CM | POA: Diagnosis not present

## 2017-04-02 DIAGNOSIS — I110 Hypertensive heart disease with heart failure: Secondary | ICD-10-CM | POA: Insufficient documentation

## 2017-04-02 DIAGNOSIS — Z7951 Long term (current) use of inhaled steroids: Secondary | ICD-10-CM | POA: Insufficient documentation

## 2017-04-02 DIAGNOSIS — Z79899 Other long term (current) drug therapy: Secondary | ICD-10-CM | POA: Insufficient documentation

## 2017-04-02 DIAGNOSIS — K219 Gastro-esophageal reflux disease without esophagitis: Secondary | ICD-10-CM | POA: Insufficient documentation

## 2017-04-02 DIAGNOSIS — Z79890 Hormone replacement therapy (postmenopausal): Secondary | ICD-10-CM | POA: Insufficient documentation

## 2017-04-02 HISTORY — PX: CATARACT EXTRACTION W/PHACO: SHX586

## 2017-04-02 LAB — GLUCOSE, CAPILLARY
GLUCOSE-CAPILLARY: 124 mg/dL — AB (ref 65–99)
Glucose-Capillary: 139 mg/dL — ABNORMAL HIGH (ref 65–99)

## 2017-04-02 SURGERY — PHACOEMULSIFICATION, CATARACT, WITH IOL INSERTION
Anesthesia: General | Laterality: Left | Wound class: Clean

## 2017-04-02 MED ORDER — MIDAZOLAM HCL 2 MG/2ML IJ SOLN
INTRAMUSCULAR | Status: DC | PRN
  Administered 2017-04-02: 2 mg via INTRAVENOUS

## 2017-04-02 MED ORDER — FENTANYL CITRATE (PF) 100 MCG/2ML IJ SOLN
INTRAMUSCULAR | Status: DC | PRN
  Administered 2017-04-02: 50 ug via INTRAVENOUS

## 2017-04-02 MED ORDER — LIDOCAINE HCL (PF) 2 % IJ SOLN
INTRAMUSCULAR | Status: DC | PRN
  Administered 2017-04-02: 1 mL via INTRAOCULAR

## 2017-04-02 MED ORDER — ARMC OPHTHALMIC DILATING DROPS
1.0000 "application " | OPHTHALMIC | Status: DC | PRN
  Administered 2017-04-02 (×3): 1 via OPHTHALMIC

## 2017-04-02 MED ORDER — SODIUM HYALURONATE 23 MG/ML IO SOLN
INTRAOCULAR | Status: DC | PRN
  Administered 2017-04-02: 0.6 mL via INTRAOCULAR

## 2017-04-02 MED ORDER — ONDANSETRON HCL 4 MG/2ML IJ SOLN: 4.0000 mg | Freq: Once | INTRAMUSCULAR | Status: DC | PRN

## 2017-04-02 MED ORDER — ACETAMINOPHEN 160 MG/5ML PO SOLN
325.0000 mg | ORAL | Status: DC | PRN
Start: 2017-04-02 — End: 2017-04-02

## 2017-04-02 MED ORDER — SODIUM HYALURONATE 10 MG/ML IO SOLN
INTRAOCULAR | Status: DC | PRN
  Administered 2017-04-02: 0.55 mL via INTRAOCULAR

## 2017-04-02 MED ORDER — NEOMYCIN-POLYMYXIN-DEXAMETH 3.5-10000-0.1 OP OINT
TOPICAL_OINTMENT | OPHTHALMIC | Status: DC | PRN
  Administered 2017-04-02: 1 via OPHTHALMIC

## 2017-04-02 MED ORDER — LACTATED RINGERS IV SOLN
INTRAVENOUS | Status: DC
Start: 2017-04-02 — End: 2017-04-02

## 2017-04-02 MED ORDER — EPINEPHRINE PF 1 MG/ML IJ SOLN
INTRAMUSCULAR | Status: DC | PRN
  Administered 2017-04-02: 76 mL via OPHTHALMIC

## 2017-04-02 MED ORDER — ACETAMINOPHEN 325 MG PO TABS: 650.0000 mg | ORAL_TABLET | Freq: Once | ORAL | Status: DC | PRN

## 2017-04-02 SURGICAL SUPPLY — 16 items
CANNULA ANT/CHMB 27GA (MISCELLANEOUS) ×3 IMPLANT
DISSECTOR HYDRO NUCLEUS 50X22 (MISCELLANEOUS) ×3 IMPLANT
GLOVE BIO SURGEON STRL SZ8 (GLOVE) ×3 IMPLANT
GLOVE SURG LX 7.5 STRW (GLOVE) ×2
GLOVE SURG LX STRL 7.5 STRW (GLOVE) ×1 IMPLANT
GOWN STRL REUS W/ TWL LRG LVL3 (GOWN DISPOSABLE) ×2 IMPLANT
GOWN STRL REUS W/TWL LRG LVL3 (GOWN DISPOSABLE) ×4
LENS IOL TECNIS ITEC 23.0 (Intraocular Lens) ×3 IMPLANT
MARKER SKIN DUAL TIP RULER LAB (MISCELLANEOUS) ×3 IMPLANT
PACK CATARACT (MISCELLANEOUS) ×3 IMPLANT
PACK DR. KING ARMS (PACKS) ×3 IMPLANT
PACK EYE AFTER SURG (MISCELLANEOUS) ×3 IMPLANT
SYR 3ML LL SCALE MARK (SYRINGE) ×3 IMPLANT
SYR TB 1ML LUER SLIP (SYRINGE) ×3 IMPLANT
WATER STERILE IRR 500ML POUR (IV SOLUTION) ×3 IMPLANT
WIPE NON LINTING 3.25X3.25 (MISCELLANEOUS) ×3 IMPLANT

## 2017-04-02 NOTE — Anesthesia Preprocedure Evaluation (Signed)
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) and +CHF  Normal cardiovascular exam Rhythm:Regular Rate:Normal     Neuro/Psych Vertigo, neuropathy    GI/Hepatic GERD  ,  Endo/Other  diabetes, Well Controlled, Type 2Hypothyroidism   Renal/GU negative Renal ROS     Musculoskeletal   Abdominal   Peds  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   Anesthesia Other Findings   Reproductive/Obstetrics                             Anesthesia Physical Anesthesia Plan  ASA: III  Anesthesia Plan: General   Post-op Pain Management:    Induction: Intravenous  PONV Risk Score and Plan: 2  Airway Management Planned: Natural Airway  Additional Equipment:   Intra-op Plan:   Post-operative Plan:   Informed Consent: I have reviewed the patients History and Physical, chart, labs and discussed the procedure including the risks, benefits and alternatives for the proposed anesthesia with the patient or authorized representative who has indicated his/her understanding and acceptance.     Plan Discussed with: CRNA  Anesthesia Plan Comments:         Anesthesia Quick Evaluation

## 2017-04-02 NOTE — Op Note (Signed)
OPERATIVE NOTE  Gina Chambers [id number] 04/02/2017   PREOPERATIVE DIAGNOSIS:  Nuclear sclerotic cataract left eye.  H25.12   POSTOPERATIVE DIAGNOSIS:    Nuclear sclerotic cataract left eye.     PROCEDURE:  Phacoemusification with posterior chamber intraocular lens placement of the left eye   LENS:   Implant Name Type Inv. Item Serial No. Manufacturer Lot No. LRB No. Used  LENS IOL DIOP 23.0 - [id number] Intraocular Lens LENS IOL DIOP 23.0 [id number] AMO  Left 1       PCB00 +23.0   ULTRASOUND TIME: 0 minutes 34.3 seconds.  CDE 4.80   SURGEON:  Benay Pillow, MD, MPH   ANESTHESIA:  Topical with tetracaine drops augmented with 1% preservative-free intracameral lidocaine.  ESTIMATED BLOOD LOSS: <1 mL   COMPLICATIONS:  None.   DESCRIPTION OF PROCEDURE:  The patient was identified in the holding room and transported to the operating room and placed in the supine position under the operating microscope.  The left eye was identified as the operative eye and it was prepped and draped in the usual sterile ophthalmic fashion.   A 1.0 millimeter clear-corneal paracentesis was made at the 5:00 position. 0.5 ml of preservative-free 1% lidocaine with epinephrine was injected into the anterior chamber.  The anterior chamber was filled with Healon 5 viscoelastic.  A 2.4 millimeter keratome was used to make a near-clear corneal incision at the 2:00 position.  A curvilinear capsulorrhexis was made with a cystotome and capsulorrhexis forceps.  Balanced salt solution was used to hydrodissect and hydrodelineate the nucleus.   Phacoemulsification was then used in stop and chop fashion to remove the lens nucleus and epinucleus.  The remaining cortex was then removed using the irrigation and aspiration handpiece. Healon was then placed into the capsular bag to distend it for lens placement.  A lens was then injected into the capsular bag.  The remaining viscoelastic was aspirated.   Wounds were hydrated  with balanced salt solution.  The anterior chamber was inflated to a physiologic pressure with balanced salt solution.  No intracameral antibiotics were used due to multiple antibiotic allergies.  No wound leaks were noted.    Generous maxitrol ointment, a patch and shield were placed on the patient's left eye.  The patient was taken to the recovery room in stable condition without complications of anesthesia or surgery  Benay Pillow 04/02/2017, 10:36 AM

## 2017-04-02 NOTE — Anesthesia Procedure Notes (Signed)
Procedure Name: MAC Performed by: Nadim Malia, CRNA Pre-anesthesia Checklist: Patient identified, Emergency Drugs available, Suction available, Timeout performed and Patient being monitored Patient Re-evaluated:Patient Re-evaluated prior to induction Oxygen Delivery Method: Nasal cannula Placement Confirmation: positive ETCO2       

## 2017-04-02 NOTE — Anesthesia Postprocedure Evaluation (Signed)
Anesthesia Post Note  Patient: Gina Chambers  Procedure(s) Performed: CATARACT EXTRACTION PHACO AND INTRAOCULAR LENS PLACEMENT (IOC) LEFT DIABETES (Left )  Patient location during evaluation: PACU Anesthesia Type: General Level of consciousness: awake and alert, oriented and patient cooperative Pain management: pain level controlled Vital Signs Assessment: post-procedure vital signs reviewed and stable Respiratory status: spontaneous breathing, nonlabored ventilation and respiratory function stable Cardiovascular status: blood pressure returned to baseline and stable Postop Assessment: adequate PO intake Anesthetic complications: no    Darrin Nipper

## 2017-04-02 NOTE — H&P (Signed)
The History and Physical notes are on paper, have been signed, and are to be scanned.   I have examined the patient and there are no changes to the H&P.   Benay Pillow 04/02/2017 9:26 AM

## 2017-04-02 NOTE — Transfer of Care (Signed)
Immediate Anesthesia Transfer of Care Note  Patient: Gina Chambers  Procedure(s) Performed: CATARACT EXTRACTION PHACO AND INTRAOCULAR LENS PLACEMENT (IOC) LEFT DIABETES (Left )  Patient Location: PACU  Anesthesia Type: General  Level of Consciousness: awake, alert  and patient cooperative  Airway and Oxygen Therapy: Patient Spontanous Breathing and Patient connected to supplemental oxygen  Post-op Assessment: Post-op Vital signs reviewed, Patient's Cardiovascular Status Stable, Respiratory Function Stable, Patent Airway and No signs of Nausea or vomiting  Post-op Vital Signs: Reviewed and stable  Complications: No apparent anesthesia complications

## 2017-04-03 ENCOUNTER — Encounter: Payer: Self-pay | Admitting: Ophthalmology

## 2017-07-19 ENCOUNTER — Other Ambulatory Visit: Payer: Self-pay | Admitting: Physical Medicine and Rehabilitation

## 2017-07-19 DIAGNOSIS — M5416 Radiculopathy, lumbar region: Secondary | ICD-10-CM

## 2017-07-29 ENCOUNTER — Ambulatory Visit
Admission: RE | Admit: 2017-07-29 | Discharge: 2017-07-29 | Disposition: A | Discharge status: Home / Self Care | Payer: Medicare Other | Source: Ambulatory Visit | Attending: Physical Medicine and Rehabilitation | Admitting: Physical Medicine and Rehabilitation

## 2017-07-29 DIAGNOSIS — R609 Edema, unspecified: Secondary | ICD-10-CM | POA: Insufficient documentation

## 2017-07-29 DIAGNOSIS — M48061 Spinal stenosis, lumbar region without neurogenic claudication: Secondary | ICD-10-CM | POA: Diagnosis not present

## 2017-07-29 DIAGNOSIS — M5416 Radiculopathy, lumbar region: Secondary | ICD-10-CM | POA: Diagnosis present

## 2017-11-06 ENCOUNTER — Emergency Department: Payer: Medicare Other

## 2017-11-06 ENCOUNTER — Inpatient Hospital Stay
Admission: EM | Admit: 2017-11-06 | Discharge: 2017-11-07 | DRG: 638 | Disposition: A | Discharge status: Home / Self Care | Payer: Medicare Other | Attending: Internal Medicine | Admitting: Internal Medicine

## 2017-11-06 ENCOUNTER — Other Ambulatory Visit: Payer: Self-pay

## 2017-11-06 DIAGNOSIS — Z79899 Other long term (current) drug therapy: Secondary | ICD-10-CM

## 2017-11-06 DIAGNOSIS — Z794 Long term (current) use of insulin: Secondary | ICD-10-CM | POA: Diagnosis not present

## 2017-11-06 DIAGNOSIS — Z9841 Cataract extraction status, right eye: Secondary | ICD-10-CM

## 2017-11-06 DIAGNOSIS — I110 Hypertensive heart disease with heart failure: Secondary | ICD-10-CM | POA: Diagnosis present

## 2017-11-06 DIAGNOSIS — W06XXXA Fall from bed, initial encounter: Secondary | ICD-10-CM | POA: Diagnosis present

## 2017-11-06 DIAGNOSIS — M25552 Pain in left hip: Secondary | ICD-10-CM | POA: Diagnosis present

## 2017-11-06 DIAGNOSIS — E162 Hypoglycemia, unspecified: Secondary | ICD-10-CM | POA: Diagnosis present

## 2017-11-06 DIAGNOSIS — Z7982 Long term (current) use of aspirin: Secondary | ICD-10-CM

## 2017-11-06 DIAGNOSIS — Z885 Allergy status to narcotic agent status: Secondary | ICD-10-CM | POA: Diagnosis not present

## 2017-11-06 DIAGNOSIS — Z79890 Hormone replacement therapy (postmenopausal): Secondary | ICD-10-CM

## 2017-11-06 DIAGNOSIS — T68XXXA Hypothermia, initial encounter: Secondary | ICD-10-CM

## 2017-11-06 DIAGNOSIS — K219 Gastro-esophageal reflux disease without esophagitis: Secondary | ICD-10-CM | POA: Diagnosis present

## 2017-11-06 DIAGNOSIS — Z7901 Long term (current) use of anticoagulants: Secondary | ICD-10-CM | POA: Diagnosis not present

## 2017-11-06 DIAGNOSIS — Z951 Presence of aortocoronary bypass graft: Secondary | ICD-10-CM

## 2017-11-06 DIAGNOSIS — I5032 Chronic diastolic (congestive) heart failure: Secondary | ICD-10-CM | POA: Diagnosis present

## 2017-11-06 DIAGNOSIS — Z888 Allergy status to other drugs, medicaments and biological substances status: Secondary | ICD-10-CM

## 2017-11-06 DIAGNOSIS — M25561 Pain in right knee: Secondary | ICD-10-CM | POA: Diagnosis present

## 2017-11-06 DIAGNOSIS — Z961 Presence of intraocular lens: Secondary | ICD-10-CM | POA: Diagnosis present

## 2017-11-06 DIAGNOSIS — Z9842 Cataract extraction status, left eye: Secondary | ICD-10-CM | POA: Diagnosis not present

## 2017-11-06 DIAGNOSIS — R52 Pain, unspecified: Secondary | ICD-10-CM

## 2017-11-06 DIAGNOSIS — M25551 Pain in right hip: Secondary | ICD-10-CM | POA: Diagnosis present

## 2017-11-06 DIAGNOSIS — G4730 Sleep apnea, unspecified: Secondary | ICD-10-CM | POA: Diagnosis present

## 2017-11-06 DIAGNOSIS — E079 Disorder of thyroid, unspecified: Secondary | ICD-10-CM | POA: Diagnosis present

## 2017-11-06 DIAGNOSIS — R680 Hypothermia, not associated with low environmental temperature: Secondary | ICD-10-CM | POA: Diagnosis present

## 2017-11-06 DIAGNOSIS — E11649 Type 2 diabetes mellitus with hypoglycemia without coma: Principal | ICD-10-CM | POA: Diagnosis present

## 2017-11-06 LAB — COMPREHENSIVE METABOLIC PANEL
ALBUMIN: 4.1 g/dL (ref 3.5–5.0)
ALT: 18 U/L (ref 0–44)
AST: 26 U/L (ref 15–41)
Alkaline Phosphatase: 48 U/L (ref 38–126)
Anion gap: 9 (ref 5–15)
BILIRUBIN TOTAL: 0.7 mg/dL (ref 0.3–1.2)
BUN: 36 mg/dL — AB (ref 8–23)
CO2: 24 mmol/L (ref 22–32)
Calcium: 8.6 mg/dL — ABNORMAL LOW (ref 8.9–10.3)
Chloride: 103 mmol/L (ref 98–111)
Creatinine, Ser: 0.97 mg/dL (ref 0.44–1.00)
GFR calc Af Amer: >60 mL/min (ref >60)
GFR calc non Af Amer: 56 mL/min — ABNORMAL LOW (ref >60)
GLUCOSE: 177 mg/dL — AB (ref 70–99)
POTASSIUM: 3.9 mmol/L (ref 3.5–5.1)
Sodium: 136 mmol/L (ref 135–145)
TOTAL PROTEIN: 6.7 g/dL (ref 6.5–8.1)

## 2017-11-06 LAB — URINALYSIS, ROUTINE W REFLEX MICROSCOPIC
BILIRUBIN URINE: NEGATIVE
Glucose, UA: 50 mg/dL — AB
HGB URINE DIPSTICK: NEGATIVE
KETONES UR: NEGATIVE mg/dL
Leukocytes, UA: NEGATIVE
Nitrite: NEGATIVE
PH: 6 (ref 5.0–8.0)
Protein, ur: NEGATIVE mg/dL
SPECIFIC GRAVITY, URINE: 1.017 (ref 1.005–1.030)

## 2017-11-06 LAB — CBC
HEMATOCRIT: 38.3 % (ref 35.0–47.0)
HEMOGLOBIN: 13 g/dL (ref 12.0–16.0)
MCH: 30.4 pg (ref 26.0–34.0)
MCHC: 33.9 g/dL (ref 32.0–36.0)
MCV: 89.7 fL (ref 80.0–100.0)
Platelets: 188 10*3/uL (ref 150–440)
RBC: 4.27 MIL/uL (ref 3.80–5.20)
RDW: 13.9 % (ref 11.5–14.5)
WBC: 15.5 10*3/uL — ABNORMAL HIGH (ref 3.6–11.0)

## 2017-11-06 LAB — GLUCOSE, CAPILLARY: Glucose-Capillary: 141 mg/dL — ABNORMAL HIGH (ref 70–99)

## 2017-11-06 LAB — TROPONIN I: Troponin I: <0.03 ng/mL (ref <0.03)

## 2017-11-06 NOTE — ED Notes (Signed)
Patient transported to CT 

## 2017-11-06 NOTE — ED Triage Notes (Signed)
Pt brought in by [facility], daughter found her under the bed unsure of fall. Pt is normally alert and ambulatory but is confused and unable to ambulate at this time. FSBS per ems was 42 pt has hx of diabetes. EMS gave her glucagon 1 mg and d10, fsbs up to 160. Pt still disoriented and weak, no obvious injury noted. Pt did take 14 u of regular insulin at 1745, fsbs at the time was 95.

## 2017-11-06 NOTE — ED Provider Notes (Signed)
[facility] Emergency Department Provider Note   ____________________________________________   First MD Initiated Contact with Patient 11/06/17 2312     (approximate)  I have reviewed the triage vital signs and the nursing notes.   HISTORY  Chief Complaint Hypoglycemia  Level 5 caveat: History obtained by patient's daughter and son-in-law; patient with altered mental state  HPI Gina Chambers is a 77 y.o. female brought to the ED from home via EMS with a chief complaint of altered mentation and hypoglycemia.  Daughter states patient was taking a nap at approximately 8 PM which was unusual for her.  Subsequently found patient on the floor beside her bed hollering.  Patient does not know if she fell.  Patient seemed confused; FS BS per EMS was 42.  Sugar increased to 160 after 1 mg IV glucagon and D10 administered by EMS.  Daughter states even after sugar increased patient still appears confused and disoriented.  Patient complains of generalized weakness and bilateral hip pain.  Denies recent fever, chills, chest pain, shortness of breath, abdominal pain, nausea, vomiting, dysuria, diarrhea.  Denies recent travel.   Past Medical History:  Diagnosis Date  . Asthma   . Asthmatic bronchitis , chronic ([place])   . CHF (congestive heart failure) ([place])   . Diabetes mellitus without complication ([place])   . 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 last one approx 2013  . Family history of adverse reaction to anesthesia    Mother - temporary paralysis of 1 side  . GERD (gastroesophageal reflux disease)   . Hypertension   . Neuropathy    feet  . PONV (postoperative nausea and vomiting)    Pt reports violent vomiting with ANY pain meds given with anesthesia.  . Sleep apnea    CPAP  . Thyroid disease   . Vertigo    daily    Patient Active Problem List   Diagnosis Date Noted  . GIB (gastrointestinal bleeding) 11/07/2016  . COPD exacerbation ([place])  05/12/2016  . Community acquired pneumonia 05/12/2016  . Leukocytosis 05/12/2016  . Generalized weakness 05/12/2016    Past Surgical History:  Procedure Laterality Date  . CARDIAC SURGERY    . CATARACT EXTRACTION W/PHACO Right 03/11/2017   Procedure: CATARACT EXTRACTION PHACO AND INTRAOCULAR LENS PLACEMENT ([place])  RIGHT DIABETIC;  Surgeon: Eulogio Bear, MD;  Location: [place];  Service: Ophthalmology;  Laterality: Right;  Diabetic - insulin sleep apnea  . CATARACT EXTRACTION W/PHACO Left 04/02/2017   Procedure: CATARACT EXTRACTION PHACO AND INTRAOCULAR LENS PLACEMENT ([place]) LEFT DIABETES;  Surgeon: Eulogio Bear, MD;  Location: [place];  Service: Ophthalmology;  Laterality: Left;  Diabetic - insulin  . COLONOSCOPY WITH PROPOFOL N/A 11/09/2016   Procedure: COLONOSCOPY WITH PROPOFOL;  Surgeon: Jonathon Bellows, MD;  [facility] ENDOSCOPY;  Service: Gastroenterology;  Laterality: N/A;  . HERNIA REPAIR    . VERTICAL BANDED GASTROPLASTY      Prior to Admission medications   Medication Sig Start Date End Date Taking? Authorizing Provider  aspirin EC 81 MG tablet Take 81 mg by mouth daily.    [provider]  B Complex Vitamins (VITAMIN B COMPLEX PO) Take by mouth.    [provider]  B Complex-C (SUPER B COMPLEX PO) Take 1 tablet by mouth daily.    [provider]  benzonatate (TESSALON) 100 MG capsule Take 1 capsule (100 mg total) by mouth 3 (three) times daily. Patient not taking:  Reported on 11/07/2016 05/14/16   Fritzi Mandes, MD  Azucena Freed Serrata (BOSWELLIA PO) Take 3 capsules by mouth 2 (two) times daily.    [provider]  Steva Colder Extract POWD Use.    [provider]  Bromelains (BROMELAIN PO) Take 1 tablet by mouth 2 (two) times daily.    [provider]  bumetanide (BUMEX) 0.5 MG tablet Take 0.5-1 mg by mouth daily. Take 2 tablets (1mg ) and 1 tablet (0.5 mg) in the evening.    [provider]  Chromium Picolinate 200 MCG CAPS Take by mouth.    [provider]  Cranberry 425 MG CAPS Take by mouth.    [provider]  Fe Asp [id number] (MULTIGEN FOLIC) 37-169-6-7 MG TABS Take by mouth.    [provider]  fluticasone (FLOVENT HFA) 110 MCG/ACT inhaler Inhale into the lungs. 09/13/16 09/13/17  [provider]  guaiFENesin (ROBITUSSIN) 100 MG/5ML SOLN Take 5 mLs (100 mg total) by mouth every 4 (four) hours as needed for cough or to loosen phlegm. Patient not taking: Reported on 11/07/2016 05/14/16   Fritzi Mandes, MD  hydrocortisone (ANUSOL-HC) 25 MG suppository Place 1 suppository (25 mg total) rectally 2 (two) times daily. Patient not taking: Reported on 04/02/2017 11/09/16   Fritzi Mandes, MD  insulin glargine (LANTUS) 100 UNIT/ML injection Inject 8-10 Units into the skin at bedtime. 10 units in the morning and 10 units in the evening    [provider]  insulin regular (NOVOLIN R,HUMULIN R) 250 units/2.28mL (100 units/mL) injection Inject 10 Units into the skin 3 (three) times daily before meals. Inject 10 units before meals per sliding scale.    [provider]  Ipratropium-Albuterol (COMBIVENT) 20-100 MCG/ACT AERS respimat Inhale 1 puff into the lungs 4 (four) times daily as needed for wheezing or shortness of breath. May take additional inhalations prn, not to exceed 6 inhalations in 24 hours.    [provider]  Iron Combinations (CHROMAGEN) capsule Take by mouth. 03/06/16   [provider]  lansoprazole (PREVACID) 30 MG capsule Take 30 mg by mouth 2 (two) times daily.    [provider]  levofloxacin (LEVAQUIN) 500 MG tablet Take 1 tablet (500 mg total) by mouth daily. Patient not taking: Reported on 11/07/2016 05/14/16   Fritzi Mandes, MD  levothyroxine (SYNTHROID, LEVOTHROID) 125 MCG tablet Take 125 mcg by mouth daily.    [provider]  Magnesium 500 MG TABS Take 500 mg by mouth  3 (three) times daily.    [provider]  metoprolol succinate (TOPROL-XL) 50 MG 24 hr tablet Take 50 mg by mouth daily. Take with or immediately following a meal.    [provider]  Multiple Vitamins-Minerals (PRESERVISION AREDS 2 PO) Take 1 tablet by mouth 2 (two) times daily.    [provider]  predniSONE (DELTASONE) 10 MG tablet Take 50 mg daily taper by 10 mg daily then stop Patient not taking: Reported on 11/07/2016 05/15/16   Fritzi Mandes, MD  rOPINIRole (REQUIP) 0.5 MG tablet Take 0.5-1 mg by mouth at bedtime as needed.    [provider]  tolterodine (DETROL LA) 4 MG 24 hr capsule Take 4 mg by mouth daily.    [provider]  UBIQUINOL PO Take by mouth.    [provider]  VITAMIN A PO Take by mouth.    [provider]  VITAMIN E PO Take by mouth.    [provider]  warfarin (COUMADIN) 4  MG tablet TAKE 1 TABLET (4 MG TOTAL) BY MOUTH ONCE DAILY. 10/26/16   [provider]  warfarin (COUMADIN) 6 MG tablet  10/03/16   [provider]    Allergies Bupropion; Calcium; Cefuroxime; Cephalexin; Clinoril [sulindac]; Codeine; Duloxetine; Ezetimibe; Fenofibrate; Furosemide; Glipizide; Lorazepam; Lortab [hydrocodone-acetaminophen]; Metaproterenol; Morphine and related; Nalbuphine; Naproxen; Norfloxacin; Rofecoxib; Sodium; Statins; Sulfa antibiotics; Tramadol; Trazodone and nefazodone; Doxycycline; Iodine; Ketoprofen; Piroxicam; and Tolmetin  Family History  Problem Relation Age of Onset  . Breast cancer Mother        68's  . Breast cancer Maternal Grandmother   . Breast cancer Paternal Grandmother     Social History Social History   Tobacco Use  . Smoking status: Never Smoker  . Smokeless tobacco: Never Used  Substance Use Topics  . Alcohol use: No  . Drug use: No    Review of Systems  Constitutional: Positive for generalized weakness.  No fever/chills Eyes: No visual changes. ENT: No sore  throat. Cardiovascular: Denies chest pain. Respiratory: Denies shortness of breath. Gastrointestinal: No abdominal pain.  No nausea, no vomiting.  No diarrhea.  No constipation. Genitourinary: Negative for dysuria. Musculoskeletal: Positive for bilateral hip pain.  Negative for back pain. Skin: Negative for rash. Neurological: Negative for headaches, focal weakness or numbness.   ____________________________________________   PHYSICAL EXAM:  VITAL SIGNS: ED Triage Vitals  Enc Vitals Group     BP 11/06/17 2302 134/64     Pulse Rate 11/06/17 2301 75     Resp 11/06/17 2301 18     Temp --      Temp src --      SpO2 11/06/17 2302 100 %     Weight 11/06/17 2259 208 lb (94.3 kg)     Height --      Head Circumference --      Peak Flow --      Pain Score 11/06/17 2259 5     Pain Loc --      Pain Edu? --      Excl. in [place]? --     Constitutional: Alert and oriented. Well appearing and in mild acute distress. Eyes: Conjunctivae are normal. PERRL. EOMI. Head: Atraumatic. Nose: No congestion/rhinnorhea. Mouth/Throat: Mucous membranes are moist.  Oropharynx non-erythematous. Neck: No stridor.  No cervical spine tenderness to palpation. Cardiovascular: Normal rate, regular rhythm. Grossly normal heart sounds.  Good peripheral circulation. Respiratory: Normal respiratory effort.  No retractions. Lungs CTAB. Gastrointestinal: Soft and nontender. No distention. No abdominal bruits. No CVA tenderness. Musculoskeletal: No lower extremity tenderness nor edema.  No joint effusions. Neurologic: Alert and oriented x2.  Normal speech and language. No gross focal neurologic deficits are appreciated.  Slightly confused.   Skin:  Skin is cool, dry and intact. No rash noted. Psychiatric: Mood and affect are normal. Speech and behavior are normal.  ____________________________________________   LABS (all labs ordered are listed, but only abnormal results are displayed)  Labs Reviewed  GLUCOSE,  CAPILLARY - Abnormal; Notable for the following components:      Result Value   Glucose-Capillary 141 (*)    All other components within normal limits  COMPREHENSIVE METABOLIC PANEL - Abnormal; Notable for the following components:   Glucose, Bld 177 (*)    BUN 36 (*)    Calcium 8.6 (*)    GFR calc non Af Amer 56 (*)    All other components within normal limits  CBC - Abnormal; Notable for the following components:   WBC 15.5 (*)  All other components within normal limits  URINALYSIS, ROUTINE W REFLEX MICROSCOPIC - Abnormal; Notable for the following components:   Color, Urine YELLOW (*)    APPearance HAZY (*)    Glucose, UA 50 (*)    All other components within normal limits  CULTURE, BLOOD (ROUTINE X 2)  CULTURE, BLOOD (ROUTINE X 2)  TROPONIN I  LACTIC ACID, PLASMA  LACTIC ACID, PLASMA  CBG MONITORING, ED  CBG MONITORING, ED  CBG MONITORING, ED   ____________________________________________  EKG  ED ECG REPORT I, Harika Laidlaw J, the attending physician, personally viewed and interpreted this ECG.   Date: 11/07/2017  EKG Time: 2259  Rate: 70  Rhythm: normal EKG, normal sinus rhythm  Axis: Normal  Intervals:none  ST&T Change: Nonspecific  ____________________________________________  RADIOLOGY  ED MD interpretation: No acute cardiopulmonary process; no ICH  Official radiology report(s): Ct Head Wo Contrast  Result Date: 11/07/2017 CLINICAL DATA:  Confusion. Fall from bed, patient found down under the bed. EXAM: CT HEAD WITHOUT CONTRAST TECHNIQUE: Contiguous axial images were obtained from the base of the skull through the vertex without intravenous contrast. COMPARISON:  None. FINDINGS: Brain: The brainstem, cerebellum, cerebral peduncles, thalami, basal ganglia, basilar cisterns, and ventricular system appear within normal limits. No intracranial hemorrhage, mass lesion, or acute CVA. Vascular: There is atherosclerotic calcification of the cavernous carotid arteries  bilaterally. Skull: Unremarkable Sinuses/Orbits: Unremarkable Other: No supplemental non-categorized findings. IMPRESSION: 1. No acute intracranial abnormalities are identified. 2. Atherosclerosis. Electronically Signed   By: Van Clines M.D.   On: 11/07/2017 00:01   Dg Chest Port 1 View  Result Date: 11/06/2017 CLINICAL DATA:  77 year old female with altered mental status. EXAM: PORTABLE CHEST 1 VIEW COMPARISON:  Chest radiograph dated 05/12/2016 FINDINGS: The lungs are clear. There is no pleural effusion or pneumothorax. The cardiac silhouette is within normal limits. Median sternotomy wires and CABG vascular clips. No acute osseous pathology. IMPRESSION: No active disease. Electronically Signed   By: Anner Crete M.D.   On: 11/06/2017 23:39    ____________________________________________   PROCEDURES  Procedure(s) performed: None  Procedures  Critical Care performed: Yes, see critical care note(s)   CRITICAL CARE Performed by: Paulette Blanch   Total critical care time: 30 minutes  Critical care time was exclusive of separately billable procedures and treating other patients.  Critical care was necessary to treat or prevent imminent or life-threatening deterioration.  Critical care was time spent personally by me on the following activities: development of treatment plan with patient and/or surrogate as well as nursing, discussions with consultants, evaluation of patient's response to treatment, examination of patient, obtaining history from patient or surrogate, ordering and performing treatments and interventions, ordering and review of laboratory studies, ordering and review of radiographic studies, pulse oximetry and re-evaluation of patient's condition.  ____________________________________________   INITIAL IMPRESSION / ASSESSMENT AND PLAN / ED COURSE  As part of my medical decision making, I reviewed the following data within the [place] History  obtained from family, Nursing notes reviewed and incorporated, Labs reviewed, EKG interpreted, Old chart reviewed, Radiograph reviewed, Discussed with admitting physician and Notes from prior ED visits   77 year old female with insulin-dependent diabetes who presents with altered mentation and hypoglycemia. Differential diagnosis includes, but is not limited to, alcohol, illicit or prescription medications, or other toxic ingestion; intracranial pathology such as stroke or intracerebral hemorrhage; fever or infectious causes including sepsis; hypoxemia and/or hypercarbia; uremia; trauma; endocrine related disorders such as diabetes, hypoglycemia, and thyroid-related diseases;  hypertensive encephalopathy; etc.  Rectal temperature is 95.2 F.  Patient placed on bair hugger.  Will obtain screening lab work for infectious work-up, CT head, chest x-ray.  Anticipate hospitalization.  Will monitor blood sugars closely.  Clinical Course as of Nov 08 710  Thu Nov 07, 2017  0120 Blood sugar 64.  Will start D10 infusion.   [JS]    Clinical Course User Index [JS] Paulette Blanch, MD     ____________________________________________   FINAL CLINICAL IMPRESSION(S) / ED DIAGNOSES  Final diagnoses:  Hypothermia, initial encounter  Hypoglycemia     ED Discharge Orders    None       Note:  This document was prepared using Dragon voice recognition software and may include unintentional dictation errors.    Paulette Blanch, MD 11/07/17 [phone]

## 2017-11-07 ENCOUNTER — Inpatient Hospital Stay: Payer: Medicare Other

## 2017-11-07 ENCOUNTER — Encounter: Payer: Self-pay | Admitting: Internal Medicine

## 2017-11-07 DIAGNOSIS — Z79890 Hormone replacement therapy (postmenopausal): Secondary | ICD-10-CM | POA: Diagnosis not present

## 2017-11-07 DIAGNOSIS — G4730 Sleep apnea, unspecified: Secondary | ICD-10-CM | POA: Diagnosis not present

## 2017-11-07 DIAGNOSIS — Z7901 Long term (current) use of anticoagulants: Secondary | ICD-10-CM | POA: Diagnosis not present

## 2017-11-07 DIAGNOSIS — Z79899 Other long term (current) drug therapy: Secondary | ICD-10-CM | POA: Diagnosis not present

## 2017-11-07 DIAGNOSIS — Z961 Presence of intraocular lens: Secondary | ICD-10-CM | POA: Diagnosis not present

## 2017-11-07 DIAGNOSIS — Z9841 Cataract extraction status, right eye: Secondary | ICD-10-CM | POA: Diagnosis not present

## 2017-11-07 DIAGNOSIS — K219 Gastro-esophageal reflux disease without esophagitis: Secondary | ICD-10-CM | POA: Diagnosis not present

## 2017-11-07 DIAGNOSIS — E11649 Type 2 diabetes mellitus with hypoglycemia without coma: Secondary | ICD-10-CM | POA: Diagnosis not present

## 2017-11-07 DIAGNOSIS — E079 Disorder of thyroid, unspecified: Secondary | ICD-10-CM | POA: Diagnosis not present

## 2017-11-07 DIAGNOSIS — M25561 Pain in right knee: Secondary | ICD-10-CM | POA: Diagnosis not present

## 2017-11-07 DIAGNOSIS — Z9842 Cataract extraction status, left eye: Secondary | ICD-10-CM | POA: Diagnosis not present

## 2017-11-07 DIAGNOSIS — Z951 Presence of aortocoronary bypass graft: Secondary | ICD-10-CM | POA: Diagnosis not present

## 2017-11-07 DIAGNOSIS — M25551 Pain in right hip: Secondary | ICD-10-CM | POA: Diagnosis not present

## 2017-11-07 DIAGNOSIS — M25552 Pain in left hip: Secondary | ICD-10-CM | POA: Diagnosis not present

## 2017-11-07 DIAGNOSIS — Z885 Allergy status to narcotic agent status: Secondary | ICD-10-CM | POA: Diagnosis not present

## 2017-11-07 DIAGNOSIS — Z794 Long term (current) use of insulin: Secondary | ICD-10-CM | POA: Diagnosis not present

## 2017-11-07 DIAGNOSIS — I5032 Chronic diastolic (congestive) heart failure: Secondary | ICD-10-CM | POA: Diagnosis not present

## 2017-11-07 DIAGNOSIS — W06XXXA Fall from bed, initial encounter: Secondary | ICD-10-CM | POA: Diagnosis not present

## 2017-11-07 DIAGNOSIS — R680 Hypothermia, not associated with low environmental temperature: Secondary | ICD-10-CM | POA: Diagnosis not present

## 2017-11-07 DIAGNOSIS — Z7982 Long term (current) use of aspirin: Secondary | ICD-10-CM | POA: Diagnosis not present

## 2017-11-07 DIAGNOSIS — E162 Hypoglycemia, unspecified: Secondary | ICD-10-CM | POA: Diagnosis present

## 2017-11-07 DIAGNOSIS — I110 Hypertensive heart disease with heart failure: Secondary | ICD-10-CM | POA: Diagnosis not present

## 2017-11-07 DIAGNOSIS — Z888 Allergy status to other drugs, medicaments and biological substances status: Secondary | ICD-10-CM | POA: Diagnosis not present

## 2017-11-07 LAB — LACTIC ACID, PLASMA
Lactic Acid, Venous: 1.2 mmol/L (ref 0.5–1.9)
Lactic Acid, Venous: 0.8 mmol/L (ref 0.5–1.9)

## 2017-11-07 LAB — GLUCOSE, CAPILLARY
GLUCOSE-CAPILLARY: 64 mg/dL — AB (ref 70–99)
GLUCOSE-CAPILLARY: 81 mg/dL (ref 70–99)
GLUCOSE-CAPILLARY: 121 mg/dL — AB (ref 70–99)
Glucose-Capillary: 139 mg/dL — ABNORMAL HIGH (ref 70–99)
Glucose-Capillary: 195 mg/dL — ABNORMAL HIGH (ref 70–99)

## 2017-11-07 LAB — PROTIME-INR
INR: 2.12
PROTHROMBIN TIME: 23.6 s — AB (ref 11.4–15.2)

## 2017-11-07 LAB — PROCALCITONIN: Procalcitonin: <0.1 ng/mL

## 2017-11-07 MED ORDER — MAGNESIUM OXIDE 400 (241.3 MG) MG PO TABS
800.0000 mg | ORAL_TABLET | Freq: Once | ORAL | Status: AC
  Administered 2017-11-07: 800 mg via ORAL
  Filled 2017-11-07: qty 2

## 2017-11-07 MED ORDER — ASPIRIN EC 81 MG PO TBEC
81.0000 mg | DELAYED_RELEASE_TABLET | Freq: Every day | ORAL | Status: DC
  Administered 2017-11-07: 81 mg via ORAL
  Filled 2017-11-07: qty 1

## 2017-11-07 MED ORDER — LIDOCAINE 5 % EX PTCH
1.0000 | MEDICATED_PATCH | CUTANEOUS | Status: DC
  Administered 2017-11-07: 1 via TRANSDERMAL
  Filled 2017-11-07: qty 1

## 2017-11-07 MED ORDER — WARFARIN SODIUM 5 MG PO TABS
5.0000 mg | ORAL_TABLET | Freq: Every day | ORAL | Status: DC
  Administered 2017-11-07: 5 mg via ORAL
  Filled 2017-11-07: qty 1

## 2017-11-07 MED ORDER — ONDANSETRON HCL 4 MG/2ML IJ SOLN: 4.0000 mg | Freq: Four times a day (QID) | INTRAMUSCULAR | Status: DC | PRN

## 2017-11-07 MED ORDER — FAMOTIDINE 20 MG PO TABS
20.0000 mg | ORAL_TABLET | Freq: Two times a day (BID) | ORAL | Status: DC
  Administered 2017-11-07 (×2): 20 mg via ORAL
  Filled 2017-11-07 (×2): qty 1

## 2017-11-07 MED ORDER — DEXTROSE 10 % IV SOLN
INTRAVENOUS | Status: DC
  Administered 2017-11-07: 50 mL/h via INTRAVENOUS

## 2017-11-07 MED ORDER — PANTOPRAZOLE SODIUM 40 MG PO TBEC
40.0000 mg | DELAYED_RELEASE_TABLET | Freq: Every day | ORAL | Status: DC
  Administered 2017-11-07: 40 mg via ORAL
  Filled 2017-11-07: qty 1

## 2017-11-07 MED ORDER — LEVOTHYROXINE SODIUM 25 MCG PO TABS
125.0000 ug | ORAL_TABLET | Freq: Every day | ORAL | Status: DC
  Administered 2017-11-07: 125 ug via ORAL
  Filled 2017-11-07: qty 1

## 2017-11-07 MED ORDER — BUMETANIDE 1 MG PO TABS
1.0000 mg | ORAL_TABLET | Freq: Every morning | ORAL | Status: DC
  Administered 2017-11-07: 1 mg via ORAL
  Filled 2017-11-07: qty 1

## 2017-11-07 MED ORDER — METOPROLOL SUCCINATE ER 50 MG PO TB24
50.0000 mg | ORAL_TABLET | Freq: Every day | ORAL | Status: DC
  Administered 2017-11-07: 50 mg via ORAL
  Filled 2017-11-07: qty 1

## 2017-11-07 MED ORDER — MAGNESIUM OXIDE 400 (241.3 MG) MG PO TABS
400.0000 mg | ORAL_TABLET | Freq: Three times a day (TID) | ORAL | Status: DC
  Administered 2017-11-07: 400 mg via ORAL
  Filled 2017-11-07: qty 1

## 2017-11-07 MED ORDER — INSULIN ASPART 100 UNIT/ML ~~LOC~~ SOLN
0.0000 [IU] | Freq: Three times a day (TID) | SUBCUTANEOUS | Status: DC
  Administered 2017-11-07: 2 [IU] via SUBCUTANEOUS
  Filled 2017-11-07: qty 1

## 2017-11-07 MED ORDER — SENNOSIDES-DOCUSATE SODIUM 8.6-50 MG PO TABS: 1.0000 | ORAL_TABLET | Freq: Every evening | ORAL | Status: DC | PRN

## 2017-11-07 MED ORDER — MONTELUKAST SODIUM 10 MG PO TABS: 10.0000 mg | ORAL_TABLET | Freq: Every day | ORAL | Status: DC

## 2017-11-07 MED ORDER — BISACODYL 5 MG PO TBEC: 5.0000 mg | DELAYED_RELEASE_TABLET | Freq: Every day | ORAL | Status: DC | PRN

## 2017-11-07 MED ORDER — BUMETANIDE 0.5 MG PO TABS
0.5000 mg | ORAL_TABLET | Freq: Every evening | ORAL | Status: DC
  Filled 2017-11-07: qty 1

## 2017-11-07 MED ORDER — BUDESONIDE 0.5 MG/2ML IN SUSP
0.5000 mg | Freq: Two times a day (BID) | RESPIRATORY_TRACT | Status: DC
  Administered 2017-11-07: 0.5 mg via RESPIRATORY_TRACT
  Filled 2017-11-07: qty 2

## 2017-11-07 MED ORDER — DEXTROSE 10 % IV SOLN
INTRAVENOUS | Status: DC
  Administered 2017-11-07: 01:00:00 via INTRAVENOUS

## 2017-11-07 MED ORDER — FLUTICASONE PROPIONATE HFA 110 MCG/ACT IN AERO: 2.0000 | INHALATION_SPRAY | Freq: Two times a day (BID) | RESPIRATORY_TRACT | Status: DC

## 2017-11-07 MED ORDER — ONDANSETRON HCL 4 MG PO TABS: 4.0000 mg | ORAL_TABLET | Freq: Four times a day (QID) | ORAL | Status: DC | PRN

## 2017-11-07 NOTE — H&P (Addendum)
[facility] NAME: Gina Chambers    MR#:  [id number]  DATE OF BIRTH:  24-Feb-1941  DATE OF ADMISSION:  11/06/2017  PRIMARY CARE PHYSICIAN: Kirk Ruths, MD   REQUESTING/REFERRING PHYSICIAN: Paulette Blanch, MD  CHIEF COMPLAINT:   Chief Complaint  Patient presents with  . Hypoglycemia    HISTORY OF PRESENT ILLNESS:  Gina Chambers  is a 77 y.o. female with a known history of T2IDDM, Hx bioprosthetic AVR + MVR (2011), recurrent DVT (Coumadin), HFpEF/chronic diastolic CHF (EF 40-34% as of 05/13/2016) p/w AMS, hypoglycemia, hypothermia. Pt w/ AMS on ED arrival, improved since. She is AAOx3 at the time of my Hx/examination, and is able to answer all of my questions w/o difficulty. She lives at home w/ her daughter (DPOA) and son-in-law, but is completely functional and independent of all ADLs/IADLs. Her daughter reports to me that the whole family was at home, w/ pt in first floor bedroom, and daughter in upstairs bedroom. Daugher states she heard noises coming from downstairs, but thought pt had turned TV volume up. Went downstairs to investigate a short time later, found pt down on floor by bed, yelling incoherently. EMS called. Glucose 42, received glucagon + D10 w/ improvement. Hypothermic to 95.26F (35.1C) in ED. Pt unable to recall this incident. Denies cough/SOB, fever, chills, diaphoresis, rigors, urinary symptoms. WBC 15.5, SIRS (+), Lactate WNL (1.2). CXR (-). U/A (-). AMS resolved. Unknown if pt fell, deneis pain/injury. Comfortable, no acute distress, does not appear septic/toxic. Denies skipped meals, family states pt has been eating well and has had good PO intake for the past 5-7 days. Pt had steroid injection in back on Monday (07/15), was told her glucose might be higher than usual, states she has taken additional corrective scale insulin at home, but denies taking additional long-acting insulin. Is medication/therapy  adherent.  PAST MEDICAL HISTORY:   Past Medical History:  Diagnosis Date  . Asthma   . Asthmatic bronchitis , chronic ([place])   . CHF (congestive heart failure) ([place])   . Diabetes mellitus without complication ([place])   . 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 last one approx 2013  . Family history of adverse reaction to anesthesia    Mother - temporary paralysis of 1 side  . GERD (gastroesophageal reflux disease)   . Hypertension   . Neuropathy    feet  . PONV (postoperative nausea and vomiting)    Pt reports violent vomiting with ANY pain meds given with anesthesia.  . Sleep apnea    CPAP  . Thyroid disease   . Vertigo    daily    PAST SURGICAL HISTORY:   Past Surgical History:  Procedure Laterality Date  . CARDIAC SURGERY    . CATARACT EXTRACTION W/PHACO Right 03/11/2017   Procedure: CATARACT EXTRACTION PHACO AND INTRAOCULAR LENS PLACEMENT ([place])  RIGHT DIABETIC;  Surgeon: Eulogio Bear, MD;  Location: [facility];  Service: Ophthalmology;  Laterality: Right;  Diabetic - insulin sleep apnea  . CATARACT EXTRACTION W/PHACO Left 04/02/2017   Procedure: CATARACT EXTRACTION PHACO AND INTRAOCULAR LENS PLACEMENT ([place]) LEFT DIABETES;  Surgeon: Eulogio Bear, MD;  Location: [facility];  Service: Ophthalmology;  Laterality: Left;  Diabetic - insulin  . COLONOSCOPY WITH PROPOFOL N/A 11/09/2016   Procedure: COLONOSCOPY WITH PROPOFOL;  Surgeon: Jonathon Bellows, MD;  Location: [facility] ENDOSCOPY;  Service: Gastroenterology;  Laterality: N/A;  . HERNIA REPAIR    .  VERTICAL BANDED GASTROPLASTY      SOCIAL HISTORY:   Social History   Tobacco Use  . Smoking status: Never Smoker  . Smokeless tobacco: Never Used  Substance Use Topics  . Alcohol use: No    FAMILY HISTORY:   Family History  Problem Relation Age of Onset  . Breast cancer Mother        36's  . Breast cancer Maternal Grandmother   . Breast cancer Paternal Grandmother     DRUG  ALLERGIES:   Allergies  Allergen Reactions  . Bupropion     GI issues   . Calcium     Chest pain   . Cefuroxime     Swelling   . Cephalexin   . Clinoril [Sulindac]     GI  . Codeine Hives  . Duloxetine     Bleeding   . Ezetimibe     Joint pain   . Fenofibrate     Leg cramps  . Furosemide     Fluid retention   . Glipizide     Bloating   . Lorazepam     SI  . Lortab [Hydrocodone-Acetaminophen] Hives  . Metaproterenol     Palpitations   . Morphine And Related   . Nalbuphine     Rapid heart rate  flushing   . Naproxen     Numbness   . Norfloxacin     Urinary retention   . Rofecoxib   . Sodium   . Statins     Muscle pain   . Sulfa Antibiotics     Unknown   . Tramadol   . Trazodone And Nefazodone   . Doxycycline Rash  . Iodine Rash  . Ketoprofen Rash  . Piroxicam Rash  . Tolmetin Rash    REVIEW OF SYSTEMS:   Review of Systems  Constitutional: Negative for chills, diaphoresis, fever, malaise/fatigue and weight loss.  HENT: Negative for congestion, ear pain, hearing loss, nosebleeds, sinus pain, sore throat and tinnitus.   Eyes: Negative for blurred vision, double vision and photophobia.  Respiratory: Negative for cough, hemoptysis, sputum production, shortness of breath and wheezing.   Cardiovascular: Negative for chest pain, palpitations, orthopnea, claudication, leg swelling and PND.  Gastrointestinal: Negative for abdominal pain, blood in stool, constipation, diarrhea, heartburn, melena, nausea and vomiting.  Genitourinary: Negative for dysuria, flank pain, frequency, hematuria and urgency.  Musculoskeletal: Negative for back pain, joint pain, myalgias and neck pain.  Skin: Negative for itching and rash.  Neurological: Negative for dizziness, tingling, tremors, sensory change, speech change, focal weakness, seizures, loss of consciousness, weakness and headaches.  Psychiatric/Behavioral: Positive for memory loss (Pt unable to recall events  immediately preceding hospitalization). The patient does not have insomnia.    States having nausea/vomiting, poor PO intake, chills/rigors the week of 07/04, after stopping Requip, but (-) in past wk. MEDICATIONS AT HOME:   Prior to Admission medications   Medication Sig Start Date End Date Taking? Authorizing Provider  aspirin EC 81 MG tablet Take 81 mg by mouth daily.   Yes [provider]  B Complex Vitamins (VITAMIN B COMPLEX PO) Take by mouth.   Yes [provider]  bumetanide (BUMEX) 0.5 MG tablet Take 0.5-1 mg by mouth daily. Take 2 tablets (1mg ) in the morning and 1 tablet (0.5 mg) in the evening.   Yes [provider]  fluticasone (FLOVENT HFA) 110 MCG/ACT inhaler Inhale into the lungs. 09/13/16 11/07/21 Yes [provider]  insulin glargine (LANTUS) 100 UNIT/ML  injection Inject 10-20 Units into the skin at bedtime. 20 units in the morning and 10 at night   Yes [provider]  insulin regular (NOVOLIN R,HUMULIN R) 250 units/2.57mL (100 units/mL) injection Inject 10 Units into the skin 3 (three) times daily before meals. Inject 10 units before meals per sliding scale.   Yes [provider]  Ipratropium-Albuterol (COMBIVENT) 20-100 MCG/ACT AERS respimat Inhale 1 puff into the lungs 4 (four) times daily as needed for wheezing or shortness of breath. May take additional inhalations prn, not to exceed 6 inhalations in 24 hours.   Yes [provider]  lansoprazole (PREVACID) 30 MG capsule Take 30 mg by mouth 2 (two) times daily.   Yes [provider]  levothyroxine (SYNTHROID, LEVOTHROID) 125 MCG tablet Take 125 mcg by mouth daily.   Yes [provider]  Magnesium 500 MG TABS Take 500 mg by mouth 3 (three) times daily.   Yes [provider]  metoprolol succinate (TOPROL-XL) 50 MG 24 hr tablet Take 50 mg by mouth daily. Take with or immediately following a meal.   Yes [provider]  montelukast  (SINGULAIR) 10 MG tablet Take 10 mg by mouth at bedtime.   Yes [provider]  Multiple Vitamins-Minerals (PRESERVISION AREDS 2 PO) Take 1 tablet by mouth 2 (two) times daily.   Yes [provider]  ranitidine (ZANTAC) 150 MG capsule Take 150 mg by mouth 2 (two) times daily.   Yes [provider]  tolterodine (DETROL LA) 4 MG 24 hr capsule Take 4 mg by mouth daily.   Yes [provider]  VITAMIN E PO Take 100 Units by mouth daily.    Yes [provider]  warfarin (COUMADIN) 5 MG tablet Take 5 mg by mouth daily.   Yes [provider]  benzonatate (TESSALON) 100 MG capsule Take 1 capsule (100 mg total) by mouth 3 (three) times daily. Patient not taking: Reported on 11/07/2016 05/14/16   Fritzi Mandes, MD  guaiFENesin (ROBITUSSIN) 100 MG/5ML SOLN Take 5 mLs (100 mg total) by mouth every 4 (four) hours as needed for cough or to loosen phlegm. Patient not taking: Reported on 11/07/2016 05/14/16   Fritzi Mandes, MD  hydrocortisone (ANUSOL-HC) 25 MG suppository Place 1 suppository (25 mg total) rectally 2 (two) times daily. Patient not taking: Reported on 04/02/2017 11/09/16   Fritzi Mandes, MD  levofloxacin (LEVAQUIN) 500 MG tablet Take 1 tablet (500 mg total) by mouth daily. Patient not taking: Reported on 11/07/2016 05/14/16   Fritzi Mandes, MD  predniSONE (DELTASONE) 10 MG tablet Take 50 mg daily taper by 10 mg daily then stop Patient not taking: Reported on 11/07/2016 05/15/16   Fritzi Mandes, MD      VITAL SIGNS:  Blood pressure 128/80, pulse 74, temperature 97.6 F (36.4 C), temperature source Oral, resp. rate 11, weight 94.3 kg (208 lb), SpO2 100 %.  PHYSICAL EXAMINATION:  Physical Exam  Constitutional: She is oriented to person, place, and time. She appears well-developed and well-nourished. She is active and cooperative.  Non-toxic appearance. She does not have a sickly appearance. She does not appear ill. No distress. She is not intubated.  HENT:   Head: Atraumatic.  Mouth/Throat: Oropharynx is clear and moist. No oropharyngeal exudate.  Eyes: Conjunctivae, EOM and lids are normal. No scleral icterus.  Neck: Neck supple. No JVD present. No neck rigidity. No thyromegaly present.  Cardiovascular: Normal rate and regular rhythm.  No extrasystoles are present. Exam reveals no gallop,  no S3, no S4, no distant heart sounds and no friction rub.  Murmur heard.  Systolic murmur is present with a grade of 2/6. Pulmonary/Chest: Effort normal and breath sounds normal. No accessory muscle usage or stridor. No apnea, no tachypnea and no bradypnea. She is not intubated. No respiratory distress. She has no decreased breath sounds. She has no wheezes. She has no rhonchi. She has no rales.  Abdominal: Soft. Bowel sounds are normal. She exhibits no distension. There is no tenderness. There is no rebound and no guarding.  Musculoskeletal: Normal range of motion. She exhibits no edema or tenderness.  Lymphadenopathy:    She has no cervical adenopathy.  Neurological: She is alert and oriented to person, place, and time. She is not disoriented.  Skin: Skin is warm, dry and intact. No rash noted. She is not diaphoretic. No erythema.  Psychiatric: She has a normal mood and affect. Her behavior is normal. Judgment and thought content normal. Her mood appears not anxious. Her affect is not angry, not blunt, not labile and not inappropriate. Her speech is not rapid and/or pressured, not delayed, not tangential and not slurred. She is not agitated, not aggressive, not hyperactive, not slowed, not withdrawn, not actively hallucinating and not combative. She does not express impulsivity or inappropriate judgment. She does not exhibit a depressed mood. She is communicative. She exhibits abnormal recent memory (Pt unable to recall events immediately prior to hospitalization (2/2 AMS)). She exhibits normal remote memory. She is attentive.   LABORATORY PANEL:   CBC Recent  Labs  Lab 11/06/17 2305  WBC 15.5*  HGB 13.0  HCT 38.3  PLT 188   ------------------------------------------------------------------------------------------------------------------  Chemistries  Recent Labs  Lab 11/06/17 2305  NA 136  K 3.9  CL 103  CO2 24  GLUCOSE 177*  BUN 36*  CREATININE 0.97  CALCIUM 8.6*  AST 26  ALT 18  ALKPHOS 48  BILITOT 0.7   ------------------------------------------------------------------------------------------------------------------  Cardiac Enzymes Recent Labs  Lab 11/06/17 2305  TROPONINI <0.03   ------------------------------------------------------------------------------------------------------------------  RADIOLOGY:  Dg Pelvis 1-2 Views  Result Date: 11/07/2017 CLINICAL DATA:  77 year old female with fall and bilateral hip pain. EXAM: PELVIS - 1-2 VIEW COMPARISON:  None. FINDINGS: There is no acute fracture or dislocation. The bones are osteopenic. Degenerative changes of the lower lumbar spine. The soft tissues appear unremarkable. IMPRESSION: 1. No acute fracture or dislocation. 2. Osteopenia. Electronically Signed   By: Anner Crete M.D.   On: 11/07/2017 00:24   Ct Head Wo Contrast  Result Date: 11/07/2017 CLINICAL DATA:  Confusion. Fall from bed, patient found down under the bed. EXAM: CT HEAD WITHOUT CONTRAST TECHNIQUE: Contiguous axial images were obtained from the base of the skull through the vertex without intravenous contrast. COMPARISON:  None. FINDINGS: Brain: The brainstem, cerebellum, cerebral peduncles, thalami, basal ganglia, basilar cisterns, and ventricular system appear within normal limits. No intracranial hemorrhage, mass lesion, or acute CVA. Vascular: There is atherosclerotic calcification of the cavernous carotid arteries bilaterally. Skull: Unremarkable Sinuses/Orbits: Unremarkable Other: No supplemental non-categorized findings. IMPRESSION: 1. No acute intracranial abnormalities are identified. 2.  Atherosclerosis. Electronically Signed   By: Van Clines M.D.   On: 11/07/2017 00:01   Dg Chest Port 1 View  Result Date: 11/06/2017 CLINICAL DATA:  77 year old female with altered mental status. EXAM: PORTABLE CHEST 1 VIEW COMPARISON:  Chest radiograph dated 05/12/2016 FINDINGS: The lungs are clear. There is no pleural effusion or pneumothorax. The cardiac silhouette is within normal limits. Median sternotomy wires and  CABG vascular clips. No acute osseous pathology. IMPRESSION: No active disease. Electronically Signed   By: Anner Crete M.D.   On: 11/06/2017 23:39   IMPRESSION AND PLAN:   A/P: 86F AMS, now resolved. Hypoglycemic (EMS glucose 42; w/ T2IDDM), hypothermic (T 95.76F/35.1C), both improved. Admission labs demonstrate hyperglycemia (glucose 177), leukocytosis. -EMS glucose 42, improved; D10 for now -FSG q4h, SSI (low), hold Lantus, resume as appropriate -Temp 95.76F (35.1C) in ED, improving; Bair hugger for now -WBC 15.5 -SIRS (+) -CXR (-) infiltrate -U/A (-) infxn -Lactate WNL (1.2) -No clear source of infxn -In absence of infxn, leukocytosis may be due to steroid injxn (vs. stress/demargination) -BCx x2 -PCT pending -No ABx administered as yet -AMS improved, possibly 2/2 hypoglycemia + hypothermia -CT head (-) acute intracranial abnl -TSH, B12, Folate, ammonia, RPR, A1c pending -Will continue to monitor/evaluate for infxn -Hold Tolterodine -c/w other home meds -Cardiac diabetic diet -Coumadin, INR 2.12 -Full code, though pt states she does not want CPR/intubation in the setting of irreversible causes of cardiorespiratory arrest -Admission, > 2 midnights   All the records are reviewed and case discussed with ED provider. Management plans discussed with the patient, family and they are in agreement.  CODE STATUS: Full code  TOTAL TIME TAKING CARE OF THIS PATIENT: 90 minutes.    Arta Silence M.D on 11/07/2017 at 2:12 AM  Between 7am to 6pm - Pager  - [phone]  After 6pm go to [email or web address] - [employer] [facility]  Office  [phone]  CC: Primary care physician; Kirk Ruths, MD   Note: This dictation was prepared with Dragon dictation along with smaller phrase technology. Any transcriptional errors that result from this process are unintentional.

## 2017-11-07 NOTE — Progress Notes (Signed)
Pt c/o of leg cramps, became upset about not taking the Magnesium that "has been promised in the ED." Per MAR, Magnesium Oxide 400mg  PO will not be due until 1000. Pt stated "she never had the Magnesium the entire day yesterday". On-call and admitting MD Prasana paged and ordered for one time dose Magnesium Oxide 800mg  PO. Will administer as ordered.

## 2017-11-07 NOTE — Care Management (Signed)
Daughter called primary nurse and states that patient did not need home health nursing. She declined any home health and patient agreed if her daughter felt this was best. Advanced referral cancelled.

## 2017-11-07 NOTE — Discharge Summary (Signed)
[facility] NAME: Gina Chambers    MR#:  [id number]  DATE OF BIRTH:  1940-08-20  DATE OF ADMISSION:  11/06/2017 ADMITTING PHYSICIAN: Arta Silence, MD  DATE OF DISCHARGE: 11/06/2017  PRIMARY CARE PHYSICIAN: Kirk Ruths, MD    ADMISSION DIAGNOSIS:  Hypoglycemia [E16.2] Hypothermia, initial encounter [T68.XXXA]  DISCHARGE DIAGNOSIS:  Hypoglycemia resolved  SECONDARY DIAGNOSIS:   Past Medical History:  Diagnosis Date  . Asthma   . Asthmatic bronchitis , chronic ([facility])   . CHF (congestive heart failure) ([place])   . Diabetes mellitus without complication ([place])   . 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 last one approx 2013  . Family history of adverse reaction to anesthesia    Mother - temporary paralysis of 1 side  . GERD (gastroesophageal reflux disease)   . Hypertension   . Neuropathy    feet  . PONV (postoperative nausea and vomiting)    Pt reports violent vomiting with ANY pain meds given with anesthesia.  . Sleep apnea    CPAP  . Thyroid disease   . Vertigo    daily    HOSPITAL COURSE:   Gina Chambers is a 77 y.o. female brought to the ED from home via EMS with a chief complaint of altered mentation and hypoglycemia.  Daughter states patient was taking a nap at approximately 8 PM which was unusual for her.  Subsequently found patient on the floor beside her bed hollering.  Patient does not know if she fell.  Patient seemed confused; FS BS per EMS was 42.  Sugar increased to 160 after 1 mg IV glucagon and D10 administered by EMS.  Daughter states even after sugar increased patient still appears confused and disoriented.   1. acute hypoglycemia suspected due to increased use of short acting insulin to adjust meals/carb take -patient was started on D10 drip. Sugars of better. She ate well. -Discussed insulin regimen with patient. She is trying to do a very tight control with amount of  carbs she takes. Her A1c is fabulous at 5.6% recently checked as outpatient -recommended her to continue the insulin regimen for now how were it is okay to have a little higher sugar than have episodes of hypoglycemia. This was discussed with patient and daughter Jacqlyn Larsen on the phone -diabetes coordinator input noted -patient voice understanding and at present does not want to change her insulin regimen  2. right knee joint pain status post fall appears bruised -x-ray negative for fracture  3. Hypertension continue home meds  Overall seems to be doing better. Should be discharged to home. Patient and daughter agreeable. CONSULTS OBTAINED:  Treatment Team:  Arta Silence, MD  DRUG ALLERGIES:   Allergies  Allergen Reactions  . Bupropion     GI issues   . Calcium     Chest pain   . Cefuroxime     Swelling   . Cephalexin   . Clinoril [Sulindac]     GI  . Codeine Hives  . Duloxetine     Bleeding   . Ezetimibe     Joint pain   . Fenofibrate     Leg cramps  . Furosemide     Fluid retention   . Glipizide     Bloating   . Lorazepam     SI  . Lortab [Hydrocodone-Acetaminophen] Hives  . Metaproterenol     Palpitations   . Morphine And Related   .  Nalbuphine     Rapid heart rate  flushing   . Naproxen     Numbness   . Norfloxacin     Urinary retention   . Rofecoxib   . Sodium   . Statins     Muscle pain   . Sulfa Antibiotics     Unknown   . Tramadol   . Trazodone And Nefazodone   . Doxycycline Rash  . Iodine Rash  . Ketoprofen Rash  . Piroxicam Rash  . Tolmetin Rash    DISCHARGE MEDICATIONS:   Allergies as of 11/07/2017      Reactions   Bupropion    GI issues   Calcium    Chest pain   Cefuroxime    Swelling   Cephalexin    Clinoril [sulindac]    GI   Codeine Hives   Duloxetine    Bleeding   Ezetimibe    Joint pain   Fenofibrate    Leg cramps   Furosemide    Fluid retention    Glipizide    Bloating   Lorazepam    SI   Lortab  [hydrocodone-acetaminophen] Hives   Metaproterenol    Palpitations    Morphine And Related    Nalbuphine    Rapid heart rate  flushing    Naproxen    Numbness   Norfloxacin    Urinary retention    Rofecoxib    Sodium    Statins    Muscle pain   Sulfa Antibiotics    Unknown   Tramadol    Trazodone And Nefazodone    Doxycycline Rash   Iodine Rash   Ketoprofen Rash   Piroxicam Rash   Tolmetin Rash      Medication List    STOP taking these medications   benzonatate 100 MG capsule Commonly known as:  TESSALON   guaiFENesin 100 MG/5ML Soln Commonly known as:  ROBITUSSIN   hydrocortisone 25 MG suppository Commonly known as:  ANUSOL-HC   levofloxacin 500 MG tablet Commonly known as:  LEVAQUIN   predniSONE 10 MG tablet Commonly known as:  DELTASONE     TAKE these medications   aspirin EC 81 MG tablet Take 81 mg by mouth daily.   bumetanide 0.5 MG tablet Commonly known as:  BUMEX Take 0.5-1 mg by mouth daily. Take 2 tablets (1mg ) in the morning and 1 tablet (0.5 mg) in the evening.   fluticasone 110 MCG/ACT inhaler Commonly known as:  FLOVENT HFA Inhale into the lungs.   insulin glargine 100 UNIT/ML injection Commonly known as:  LANTUS Inject 10-20 Units into the skin at bedtime. 20 units in the morning and 10 at night   insulin regular 100 units/mL injection Commonly known as:  NOVOLIN R,HUMULIN R Inject 10 Units into the skin 3 (three) times daily before meals. Inject 10 units before meals per sliding scale.   Ipratropium-Albuterol 20-100 MCG/ACT Aers respimat Commonly known as:  COMBIVENT Inhale 1 puff into the lungs 4 (four) times daily as needed for wheezing or shortness of breath. May take additional inhalations prn, not to exceed 6 inhalations in 24 hours.   lansoprazole 30 MG capsule Commonly known as:  PREVACID Take 30 mg by mouth 2 (two) times daily.   levothyroxine 125 MCG tablet Commonly known as:  SYNTHROID, LEVOTHROID Take 125 mcg by  mouth daily.   Magnesium 500 MG Tabs Take 500 mg by mouth 3 (three) times daily.   metoprolol succinate 50 MG 24 hr tablet Commonly known  as:  TOPROL-XL Take 50 mg by mouth daily. Take with or immediately following a meal.   montelukast 10 MG tablet Commonly known as:  SINGULAIR Take 10 mg by mouth at bedtime.   PRESERVISION AREDS 2 PO Take 1 tablet by mouth 2 (two) times daily.   ranitidine 150 MG capsule Commonly known as:  ZANTAC Take 150 mg by mouth 2 (two) times daily.   tolterodine 4 MG 24 hr capsule Commonly known as:  DETROL LA Take 4 mg by mouth daily.   VITAMIN B COMPLEX PO Take by mouth.   VITAMIN E PO Take 100 Units by mouth daily.   warfarin 5 MG tablet Commonly known as:  COUMADIN Take 5 mg by mouth daily.       If you experience worsening of your admission symptoms, develop shortness of breath, life threatening emergency, suicidal or homicidal thoughts you must seek medical attention immediately by calling 911 or calling your MD immediately  if symptoms less severe.  You Must read complete instructions/literature along with all the possible adverse reactions/side effects for all the Medicines you take and that have been prescribed to you. Take any new Medicines after you have completely understood and accept all the possible adverse reactions/side effects.   Please note  You were cared for by a hospitalist during your hospital stay. If you have any questions about your discharge medications or the care you received while you were in the hospital after you are discharged, you can call the unit and asked to speak with the hospitalist on call if the hospitalist that took care of you is not available. Once you are discharged, your primary care physician will handle any further medical issues. Please note that NO REFILLS for any discharge medications will be authorized once you are discharged, as it is imperative that you return to your primary care physician (or  establish a relationship with a primary care physician if you do not have one) for your aftercare needs so that they can reassess your need for medications and monitor your lab values. Today   SUBJECTIVE   No new complaints. Had some right knee pain earlier VITAL SIGNS:  Blood pressure (!) 118/55, pulse 76, temperature 98.3 F (36.8 C), temperature source Oral, resp. rate 11, weight 94.3 kg (208 lb), SpO2 98 %.  I/O:    Intake/Output Summary (Last 24 hours) at 11/07/2017 1541 Last data filed at 11/07/2017 1419 Gross per 24 hour  Intake 828.75 ml  Output -  Net 828.75 ml    PHYSICAL EXAMINATION:  GENERAL:  77 y.o.-year-old patient lying in the bed with no acute distress. Obese EYES: Pupils equal, round, reactive to light and accommodation. No scleral icterus. Extraocular muscles intact.  HEENT: Head atraumatic, normocephalic. Oropharynx and nasopharynx clear.  NECK:  Supple, no jugular venous distention. No thyroid enlargement, no tenderness.  LUNGS: Normal breath sounds bilaterally, no wheezing, rales,rhonchi or crepitation. No use of accessory muscles of respiration.  CARDIOVASCULAR: S1, S2 normal. No murmurs, rubs, or gallops.  ABDOMEN: Soft, non-tender, non-distended. Bowel sounds present. No organomegaly or mass.  EXTREMITIES: No pedal edema, cyanosis, or clubbing.  NEUROLOGIC: Cranial nerves II through XII are intact. Muscle strength 5/5 in all extremities. Sensation intact. Gait not checked.  PSYCHIATRIC: The patient is alert and oriented x 3.  SKIN: No obvious rash, lesion, or ulcer.   DATA REVIEW:   CBC  Recent Labs  Lab 11/06/17 2305  WBC 15.5*  HGB 13.0  HCT 38.3  PLT  [place]  Lab 11/06/17 2305  NA 136  K 3.9  CL 103  CO2 24  GLUCOSE 177*  BUN 36*  CREATININE 0.97  CALCIUM 8.6*  AST 26  ALT 18  ALKPHOS 48  BILITOT 0.7    Microbiology Results   Recent Results (from the past 240 hour(s))  Culture, blood (routine x 2)      Status: None (Preliminary result)   Collection Time: 11/07/17 12:32 AM  Result Value Ref Range Status   Specimen Description BLOOD LEFT WRIST  Final   Special Requests   Final    BOTTLES DRAWN AEROBIC AND ANAEROBIC Blood Culture adequate volume   Culture   Final    NO GROWTH < 12 HOURS Performed at [facility], [address], [place], [place] [address]    Report Status PENDING  Incomplete  Culture, blood (routine x 2)     Status: None (Preliminary result)   Collection Time: 11/07/17  1:26 AM  Result Value Ref Range Status   Specimen Description BLOOD LEFT HAND  Final   Special Requests   Final    BOTTLES DRAWN AEROBIC AND ANAEROBIC Blood Culture adequate volume   Culture   Final    NO GROWTH < 12 HOURS Performed at [facility], [address], [place], [place] [address]    Report Status PENDING  Incomplete    RADIOLOGY:  Dg Pelvis 1-2 Views  Result Date: 11/07/2017 CLINICAL DATA:  77 year old female with fall and bilateral hip pain. EXAM: PELVIS - 1-2 VIEW COMPARISON:  None. FINDINGS: There is no acute fracture or dislocation. The bones are osteopenic. Degenerative changes of the lower lumbar spine. The soft tissues appear unremarkable. IMPRESSION: 1. No acute fracture or dislocation. 2. Osteopenia. Electronically Signed   By: Anner Crete M.D.   On: 11/07/2017 00:24   Ct Head Wo Contrast  Result Date: 11/07/2017 CLINICAL DATA:  Confusion. Fall from bed, patient found down under the bed. EXAM: CT HEAD WITHOUT CONTRAST TECHNIQUE: Contiguous axial images were obtained from the base of the skull through the vertex without intravenous contrast. COMPARISON:  None. FINDINGS: Brain: The brainstem, cerebellum, cerebral peduncles, thalami, basal ganglia, basilar cisterns, and ventricular system appear within normal limits. No intracranial hemorrhage, mass lesion, or acute CVA. Vascular: There is atherosclerotic calcification of the cavernous carotid arteries  bilaterally. Skull: Unremarkable Sinuses/Orbits: Unremarkable Other: No supplemental non-categorized findings. IMPRESSION: 1. No acute intracranial abnormalities are identified. 2. Atherosclerosis. Electronically Signed   By: Van Clines M.D.   On: 11/07/2017 00:01   Dg Chest Port 1 View  Result Date: 11/06/2017 CLINICAL DATA:  77 year old female with altered mental status. EXAM: PORTABLE CHEST 1 VIEW COMPARISON:  Chest radiograph dated 05/12/2016 FINDINGS: The lungs are clear. There is no pleural effusion or pneumothorax. The cardiac silhouette is within normal limits. Median sternotomy wires and CABG vascular clips. No acute osseous pathology. IMPRESSION: No active disease. Electronically Signed   By: Anner Crete M.D.   On: 11/06/2017 23:39   Dg Knee Complete 4 Views Right  Result Date: 11/07/2017 CLINICAL DATA:  Fall.  Pain EXAM: RIGHT KNEE - COMPLETE 4+ VIEW COMPARISON:  None. FINDINGS: Joint space narrowing and early spurring in the patellofemoral compartment. No acute bony abnormality. Specifically, no fracture, subluxation, or dislocation. No joint effusion. IMPRESSION: No acute bony abnormality.  Early osteoarthritis. Electronically Signed   By: Rolm Baptise M.D.   On: 11/07/2017 14:46     Management  plans discussed with the patient, family and they are in agreement.  CODE STATUS:     Code Status Orders  (From admission, onward)        Start     Ordered   11/07/17 0222  Full code  Continuous     11/07/17 0221    Code Status History    Date Active Date Inactive Code Status Order ID Comments User Context   11/07/2016 1851 11/09/2016 1945 Full Code [id number]  Demetrios Loll, MD Inpatient   05/12/2016 2034 05/14/2016 1617 Full Code [id number]  Theodoro Grist, MD Inpatient    Advance Directive Documentation     Most Recent Value  Type of Advance Directive  Healthcare Power of [place], Living will  Pre-existing out of facility DNR order (yellow form or pink MOST form)  -   "MOST" Form in Place?  -      TOTAL TIME TAKING CARE OF THIS PATIENT: *40** minutes.    Fritzi Mandes M.D on 11/07/2017 at 3:41 PM  Between 7am to 6pm - Pager - [phone] After 6pm go to [email or web address] - password [email or web address] [facility]  Office  [phone]  CC: Primary care physician; Kirk Ruths, MD

## 2017-11-07 NOTE — Progress Notes (Signed)
Discussed AVS with patient. No medication changes. Patient confirmed that all meds that she was being instructed to stop she was not taking PTA. Talked with son in law and daughter who are coming to get patient.

## 2017-11-07 NOTE — Progress Notes (Signed)
Inpatient Diabetes Program Recommendations  AACE/ADA: New Consensus Statement on Inpatient Glycemic Control (2019)  Target Ranges:  Prepandial:   less than 140 mg/dL      Peak postprandial:   less than 180 mg/dL (1-2 hours)      Critically ill patients:  140 - 180 mg/dL    Review of Glycemic Control  Outpatient Diabetes medications: Lantus 20 units QAM, Lantus 10 units QHS, Regular 10 units TID with meals Current orders for Inpatient glycemic control: Novolog 0-9 units TID with meals  Inpatient Diabetes Program Recommendations: HgbA1C: Per Care Everywhere, last A1C was 5.4% on 08/21/2017 indicating an average glucose of 108 mg/dl.  NOTE: Noted consult for Inpatient Diabetes Coordinator. Spoke with patient about diabetes and home regimen for diabetes control. Patient reports that she has had DM for over 40 years and she is followed by PCP for diabetes management.  Patient reports that she is consistently taking insulin as prescribed. Patient has an [place] speaker that she has set up to remind her to take insulin and make sure she eats. Patient received steroid injection on 11/04/17 and reports that glucose has been running higher since she received the steroid injection so she has been taking additional units of regular insulin. Patient reports that she took Regular 14 units about 15 minutes prior to supper last night since she was anticipating to eat more carbs since her daughter made a casserole. However, the casserole had very few carbohydrates. Patient states then she laid down and doesn't remember anything much after that prior to coming to the hospital. Patient states that she checks her glucose 3-4 times per day and she targets glucose to be less than 150 mg/dl. Patient reports that she works very hard to keep glucose as well controlled as possible. Patient denies frequent hypoglycemia. Discussed A1C results noted in Care Everywhere (5.4% on 08/21/17) and explained that  A1C indicates an average  glucose of 108 mg/dl over the past 2-3 months. In reviewing A1C values in Care Everywhere noted patient's A1C has been in the 5% range for over 2 years. Explained that with such tight A1C control, there is a higher risk of hypoglycemia. Patient states that she has had family members with complications from uncontrolled DM and she does not want to have complications that are caused by hyperglycemia. Patient reports that her daughter prepares meals at home.  Encouraged patient to talk with her daughter to see what she prepares for meals prior to taking her Regular insulin before meals. Encouraged patient to continue working with PCP regarding DM control.  Patient verbalized understanding of information discussed and she states that she has no further questions at this time related to diabetes.   Thanks, Barnie Alderman, RN, MSN, CDE Diabetes Coordinator Inpatient Diabetes Program [phone] (Team Pager)

## 2017-11-07 NOTE — Care Management Note (Signed)
Case Management Note  Patient Details  Name: Gina Chambers MRN: [id number] Date of Birth: [birth year]  Subjective/Objective:   Met with patient and Dr. Posey Pronto at bedside to discuss discharge planning. Patient lives at home with her daughter who does the cooking. Patient admitted with hypoglycemia. She has been a diabetic since age 77. She often self adjusts her regular insulin based on her meals. Discuss home health and a RN to assist with disease management. Patient agreeable. Offered a list of home health agencies. Patient has no preference. [facility]erral to [facility] for [facility]. No DME needed. Patient ambulates without difficulty. PCP is M. Anderson. Discharging today                Action/Plan:   Expected Discharge Date:                  Expected Discharge Plan:  [place]  In-House Referral:     Discharge planning Services  CM Consult  Post Acute Care Choice:  Home Health Choice offered to:  Patient  DME Arranged:    DME Agency:     HH Arranged:  RN, Disease Management [place] Agency:  [facility]  Status of Service:  Completed, signed off  If discussed at [place] of Stay Meetings, dates discussed:    Additional Comments:  Jolly Mango, RN 11/07/2017, 9:25 AM

## 2017-11-12 LAB — CULTURE, BLOOD (ROUTINE X 2)
CULTURE: NO GROWTH
Culture: NO GROWTH
SPECIAL REQUESTS: ADEQUATE
Special Requests: ADEQUATE

## 2018-03-24 ENCOUNTER — Other Ambulatory Visit: Payer: Self-pay | Admitting: Internal Medicine

## 2018-03-24 DIAGNOSIS — Z1231 Encounter for screening mammogram for malignant neoplasm of breast: Secondary | ICD-10-CM

## 2018-04-01 ENCOUNTER — Ambulatory Visit: Payer: Medicare Other

## 2018-12-24 ENCOUNTER — Observation Stay
Admit: 2018-12-24 | Discharge: 2018-12-24 | Disposition: A | Discharge status: Home / Self Care | Payer: Medicare Other | Attending: Rehabilitative and Restorative Service Providers" | Admitting: Rehabilitative and Restorative Service Providers"

## 2018-12-24 ENCOUNTER — Encounter: Payer: Self-pay | Admitting: Emergency Medicine

## 2018-12-24 ENCOUNTER — Observation Stay
Admission: EM | Admit: 2018-12-24 | Discharge: 2018-12-25 | Disposition: A | Discharge status: Home / Self Care | Payer: Medicare Other | Attending: Internal Medicine | Admitting: Internal Medicine

## 2018-12-24 ENCOUNTER — Emergency Department: Payer: Medicare Other

## 2018-12-24 ENCOUNTER — Other Ambulatory Visit: Payer: Self-pay

## 2018-12-24 DIAGNOSIS — I420 Dilated cardiomyopathy: Secondary | ICD-10-CM | POA: Diagnosis not present

## 2018-12-24 DIAGNOSIS — R0789 Other chest pain: Secondary | ICD-10-CM | POA: Diagnosis not present

## 2018-12-24 DIAGNOSIS — Z7901 Long term (current) use of anticoagulants: Secondary | ICD-10-CM | POA: Insufficient documentation

## 2018-12-24 DIAGNOSIS — Z794 Long term (current) use of insulin: Secondary | ICD-10-CM | POA: Insufficient documentation

## 2018-12-24 DIAGNOSIS — E1140 Type 2 diabetes mellitus with diabetic neuropathy, unspecified: Secondary | ICD-10-CM | POA: Insufficient documentation

## 2018-12-24 DIAGNOSIS — E669 Obesity, unspecified: Secondary | ICD-10-CM | POA: Diagnosis not present

## 2018-12-24 DIAGNOSIS — Z79899 Other long term (current) drug therapy: Secondary | ICD-10-CM | POA: Diagnosis not present

## 2018-12-24 DIAGNOSIS — Z882 Allergy status to sulfonamides status: Secondary | ICD-10-CM | POA: Insufficient documentation

## 2018-12-24 DIAGNOSIS — Z886 Allergy status to analgesic agent status: Secondary | ICD-10-CM | POA: Insufficient documentation

## 2018-12-24 DIAGNOSIS — Z7982 Long term (current) use of aspirin: Secondary | ICD-10-CM | POA: Insufficient documentation

## 2018-12-24 DIAGNOSIS — K219 Gastro-esophageal reflux disease without esophagitis: Secondary | ICD-10-CM | POA: Diagnosis not present

## 2018-12-24 DIAGNOSIS — Z881 Allergy status to other antibiotic agents status: Secondary | ICD-10-CM | POA: Diagnosis not present

## 2018-12-24 DIAGNOSIS — Z9884 Bariatric surgery status: Secondary | ICD-10-CM | POA: Insufficient documentation

## 2018-12-24 DIAGNOSIS — Z888 Allergy status to other drugs, medicaments and biological substances status: Secondary | ICD-10-CM | POA: Insufficient documentation

## 2018-12-24 DIAGNOSIS — Z86718 Personal history of other venous thrombosis and embolism: Secondary | ICD-10-CM | POA: Insufficient documentation

## 2018-12-24 DIAGNOSIS — I110 Hypertensive heart disease with heart failure: Secondary | ICD-10-CM | POA: Insufficient documentation

## 2018-12-24 DIAGNOSIS — Z951 Presence of aortocoronary bypass graft: Secondary | ICD-10-CM | POA: Insufficient documentation

## 2018-12-24 DIAGNOSIS — Z20828 Contact with and (suspected) exposure to other viral communicable diseases: Secondary | ICD-10-CM | POA: Insufficient documentation

## 2018-12-24 DIAGNOSIS — Z7951 Long term (current) use of inhaled steroids: Secondary | ICD-10-CM | POA: Insufficient documentation

## 2018-12-24 DIAGNOSIS — G4733 Obstructive sleep apnea (adult) (pediatric): Secondary | ICD-10-CM | POA: Insufficient documentation

## 2018-12-24 DIAGNOSIS — Z79890 Hormone replacement therapy (postmenopausal): Secondary | ICD-10-CM | POA: Insufficient documentation

## 2018-12-24 DIAGNOSIS — E785 Hyperlipidemia, unspecified: Secondary | ICD-10-CM | POA: Insufficient documentation

## 2018-12-24 DIAGNOSIS — J449 Chronic obstructive pulmonary disease, unspecified: Secondary | ICD-10-CM | POA: Insufficient documentation

## 2018-12-24 DIAGNOSIS — Z885 Allergy status to narcotic agent status: Secondary | ICD-10-CM | POA: Diagnosis not present

## 2018-12-24 DIAGNOSIS — I5032 Chronic diastolic (congestive) heart failure: Secondary | ICD-10-CM | POA: Diagnosis not present

## 2018-12-24 DIAGNOSIS — R079 Chest pain, unspecified: Secondary | ICD-10-CM

## 2018-12-24 DIAGNOSIS — E079 Disorder of thyroid, unspecified: Secondary | ICD-10-CM | POA: Insufficient documentation

## 2018-12-24 DIAGNOSIS — Z952 Presence of prosthetic heart valve: Secondary | ICD-10-CM | POA: Insufficient documentation

## 2018-12-24 DIAGNOSIS — I080 Rheumatic disorders of both mitral and aortic valves: Secondary | ICD-10-CM | POA: Insufficient documentation

## 2018-12-24 LAB — GLUCOSE, CAPILLARY
Glucose-Capillary: 132 mg/dL — ABNORMAL HIGH (ref 70–99)
Glucose-Capillary: 121 mg/dL — ABNORMAL HIGH (ref 70–99)
Glucose-Capillary: 188 mg/dL — ABNORMAL HIGH (ref 70–99)
Glucose-Capillary: 168 mg/dL — ABNORMAL HIGH (ref 70–99)

## 2018-12-24 LAB — BASIC METABOLIC PANEL
Anion gap: 9 (ref 5–15)
BUN: 28 mg/dL — ABNORMAL HIGH (ref 8–23)
CO2: 25 mmol/L (ref 22–32)
Calcium: 9.4 mg/dL (ref 8.9–10.3)
Chloride: 105 mmol/L (ref 98–111)
Creatinine, Ser: 0.88 mg/dL (ref 0.44–1.00)
GFR calc Af Amer: >60 mL/min (ref >60)
GFR calc non Af Amer: >60 mL/min (ref >60)
Glucose, Bld: 137 mg/dL — ABNORMAL HIGH (ref 70–99)
Potassium: 3.7 mmol/L (ref 3.5–5.1)
Sodium: 139 mmol/L (ref 135–145)

## 2018-12-24 LAB — SARS CORONAVIRUS 2 BY RT PCR (HOSPITAL ORDER, PERFORMED IN ~~LOC~~ HOSPITAL LAB): SARS Coronavirus 2: NEGATIVE

## 2018-12-24 LAB — PROTIME-INR
INR: 2 — ABNORMAL HIGH (ref 0.8–1.2)
Prothrombin Time: 22.4 seconds — ABNORMAL HIGH (ref 11.4–15.2)

## 2018-12-24 LAB — CBC
HCT: 38.3 % (ref 36.0–46.0)
Hemoglobin: 12.5 g/dL (ref 12.0–15.0)
MCH: 29.2 pg (ref 26.0–34.0)
MCHC: 32.6 g/dL (ref 30.0–36.0)
MCV: 89.5 fL (ref 80.0–100.0)
Platelets: 222 10*3/uL (ref 150–400)
RBC: 4.28 MIL/uL (ref 3.87–5.11)
RDW: 14 % (ref 11.5–15.5)
WBC: 8.5 10*3/uL (ref 4.0–10.5)
nRBC: 0 % (ref 0.0–0.2)

## 2018-12-24 LAB — HEMOGLOBIN A1C
Hgb A1c MFr Bld: 5.3 % (ref 4.8–5.6)
Mean Plasma Glucose: 105.41 mg/dL

## 2018-12-24 LAB — TROPONIN I (HIGH SENSITIVITY)
Troponin I (High Sensitivity): 9 ng/L (ref <18)
Troponin I (High Sensitivity): 8 ng/L (ref <18)
Troponin I (High Sensitivity): 8 ng/L (ref <18)

## 2018-12-24 LAB — TSH: TSH: 1.768 u[IU]/mL (ref 0.350–4.500)

## 2018-12-24 LAB — APTT: aPTT: 37 seconds — ABNORMAL HIGH (ref 24–36)

## 2018-12-24 MED ORDER — ENOXAPARIN SODIUM 40 MG/0.4ML ~~LOC~~ SOLN: 40.0000 mg | Freq: Every day | SUBCUTANEOUS | Status: DC

## 2018-12-24 MED ORDER — INSULIN REGULAR HUMAN 100 UNIT/ML IJ SOLN: 10.0000 [IU] | Freq: Three times a day (TID) | INTRAMUSCULAR | Status: DC

## 2018-12-24 MED ORDER — LEVOTHYROXINE SODIUM 25 MCG PO TABS: 125.0000 ug | ORAL_TABLET | Freq: Every day | ORAL | Status: DC

## 2018-12-24 MED ORDER — BUMETANIDE 0.5 MG PO TABS: 0.5000 mg | ORAL_TABLET | Freq: Every day | ORAL | Status: DC

## 2018-12-24 MED ORDER — WARFARIN SODIUM 2.5 MG PO TABS
7.5000 mg | ORAL_TABLET | Freq: Once | ORAL | Status: AC
  Administered 2018-12-24: 7.5 mg via ORAL
  Filled 2018-12-24: qty 3

## 2018-12-24 MED ORDER — ATORVASTATIN CALCIUM 20 MG PO TABS: 20.0000 mg | ORAL_TABLET | Freq: Every day | ORAL | Status: DC

## 2018-12-24 MED ORDER — MONTELUKAST SODIUM 10 MG PO TABS
10.0000 mg | ORAL_TABLET | Freq: Every day | ORAL | Status: DC
  Administered 2018-12-24: 23:00:00 10 mg via ORAL
  Filled 2018-12-24 (×2): qty 1

## 2018-12-24 MED ORDER — IPRATROPIUM-ALBUTEROL 0.5-2.5 (3) MG/3ML IN SOLN: 3.0000 mL | Freq: Four times a day (QID) | RESPIRATORY_TRACT | Status: DC | PRN

## 2018-12-24 MED ORDER — MAGNESIUM OXIDE 400 (241.3 MG) MG PO TABS
400.0000 mg | ORAL_TABLET | Freq: Three times a day (TID) | ORAL | Status: DC
  Administered 2018-12-24 – 2018-12-25 (×3): 400 mg via ORAL
  Filled 2018-12-24 (×6): qty 1

## 2018-12-24 MED ORDER — VITAMIN B COMPLEX PO TABS: 1.0000 | ORAL_TABLET | Freq: Every day | ORAL | Status: DC

## 2018-12-24 MED ORDER — BUMETANIDE 1 MG PO TABS
1.0000 mg | ORAL_TABLET | Freq: Every day | ORAL | Status: DC
  Administered 2018-12-24 – 2018-12-25 (×2): 1 mg via ORAL
  Filled 2018-12-24 (×2): qty 1

## 2018-12-24 MED ORDER — SODIUM CHLORIDE 0.9 % IV SOLN: INTRAVENOUS | Status: DC

## 2018-12-24 MED ORDER — OCUVITE-LUTEIN PO CAPS
ORAL_CAPSULE | Freq: Two times a day (BID) | ORAL | Status: DC
  Administered 2018-12-24 – 2018-12-25 (×3): 1 via ORAL
  Filled 2018-12-24 (×4): qty 1

## 2018-12-24 MED ORDER — FAMOTIDINE 20 MG PO TABS
20.0000 mg | ORAL_TABLET | Freq: Two times a day (BID) | ORAL | Status: DC
  Administered 2018-12-24 (×2): 20 mg via ORAL
  Filled 2018-12-24 (×2): qty 1

## 2018-12-24 MED ORDER — ALUM & MAG HYDROXIDE-SIMETH 200-200-20 MG/5ML PO SUSP: 30.0000 mL | Freq: Once | ORAL | Status: DC

## 2018-12-24 MED ORDER — BUMETANIDE 0.5 MG PO TABS
0.5000 mg | ORAL_TABLET | Freq: Every day | ORAL | Status: DC
  Administered 2018-12-24: 0.5 mg via ORAL
  Filled 2018-12-24 (×2): qty 1

## 2018-12-24 MED ORDER — DIPHENHYDRAMINE HCL 25 MG PO CAPS
25.0000 mg | ORAL_CAPSULE | Freq: Four times a day (QID) | ORAL | Status: DC | PRN
  Administered 2018-12-24: 25 mg via ORAL
  Filled 2018-12-24: qty 1

## 2018-12-24 MED ORDER — ZOLPIDEM TARTRATE 5 MG PO TABS
5.0000 mg | ORAL_TABLET | Freq: Every evening | ORAL | Status: DC | PRN
  Filled 2018-12-24: qty 1

## 2018-12-24 MED ORDER — VITAMIN E 45 MG (100 UNIT) PO CAPS
100.0000 [IU] | ORAL_CAPSULE | Freq: Every day | ORAL | Status: DC
  Administered 2018-12-24 – 2018-12-25 (×2): 100 [IU] via ORAL
  Filled 2018-12-24 (×2): qty 1

## 2018-12-24 MED ORDER — INSULIN GLARGINE 100 UNIT/ML ~~LOC~~ SOLN
20.0000 [IU] | Freq: Every day | SUBCUTANEOUS | Status: DC
  Filled 2018-12-24 (×3): qty 0.2

## 2018-12-24 MED ORDER — ACETAMINOPHEN 325 MG PO TABS: 650.0000 mg | ORAL_TABLET | ORAL | Status: DC | PRN

## 2018-12-24 MED ORDER — IPRATROPIUM-ALBUTEROL 20-100 MCG/ACT IN AERS: 1.0000 | INHALATION_SPRAY | Freq: Four times a day (QID) | RESPIRATORY_TRACT | Status: DC | PRN

## 2018-12-24 MED ORDER — PANTOPRAZOLE SODIUM 20 MG PO TBEC
20.0000 mg | DELAYED_RELEASE_TABLET | Freq: Every day | ORAL | Status: DC
  Administered 2018-12-24 – 2018-12-25 (×2): 20 mg via ORAL
  Filled 2018-12-24 (×2): qty 1

## 2018-12-24 MED ORDER — ASPIRIN EC 81 MG PO TBEC
81.0000 mg | DELAYED_RELEASE_TABLET | Freq: Every day | ORAL | Status: DC
  Administered 2018-12-24 – 2018-12-25 (×2): 81 mg via ORAL
  Filled 2018-12-24 (×3): qty 1

## 2018-12-24 MED ORDER — FESOTERODINE FUMARATE ER 4 MG PO TB24
4.0000 mg | ORAL_TABLET | Freq: Every day | ORAL | Status: DC
  Administered 2018-12-24 – 2018-12-25 (×2): 4 mg via ORAL
  Filled 2018-12-24 (×2): qty 1

## 2018-12-24 MED ORDER — LIDOCAINE VISCOUS HCL 2 % MT SOLN
15.0000 mL | Freq: Once | OROMUCOSAL | Status: DC
  Filled 2018-12-24: qty 15

## 2018-12-24 MED ORDER — METOPROLOL SUCCINATE ER 50 MG PO TB24
50.0000 mg | ORAL_TABLET | Freq: Every day | ORAL | Status: DC
  Administered 2018-12-25: 15:00:00 50 mg via ORAL
  Filled 2018-12-24: qty 1

## 2018-12-24 MED ORDER — BUDESONIDE 0.25 MG/2ML IN SUSP
0.2500 mg | Freq: Two times a day (BID) | RESPIRATORY_TRACT | Status: DC
  Administered 2018-12-24 – 2018-12-25 (×2): 0.25 mg via RESPIRATORY_TRACT
  Filled 2018-12-24 (×3): qty 2

## 2018-12-24 MED ORDER — INSULIN GLARGINE 100 UNIT/ML ~~LOC~~ SOLN
10.0000 [IU] | Freq: Every day | SUBCUTANEOUS | Status: DC
  Administered 2018-12-24: 23:00:00 10 [IU] via SUBCUTANEOUS
  Filled 2018-12-24 (×2): qty 0.1

## 2018-12-24 MED ORDER — FLUTICASONE PROPIONATE HFA 110 MCG/ACT IN AERO: 2.0000 | INHALATION_SPRAY | Freq: Two times a day (BID) | RESPIRATORY_TRACT | Status: DC

## 2018-12-24 MED ORDER — ONDANSETRON HCL 4 MG/2ML IJ SOLN: 4.0000 mg | Freq: Four times a day (QID) | INTRAMUSCULAR | Status: DC | PRN

## 2018-12-24 MED ORDER — INSULIN ASPART 100 UNIT/ML ~~LOC~~ SOLN
0.0000 [IU] | Freq: Three times a day (TID) | SUBCUTANEOUS | Status: DC
  Administered 2018-12-24: 3 [IU] via SUBCUTANEOUS
  Filled 2018-12-24: qty 1

## 2018-12-24 MED ORDER — SENNOSIDES-DOCUSATE SODIUM 8.6-50 MG PO TABS
1.0000 | ORAL_TABLET | Freq: Two times a day (BID) | ORAL | Status: DC | PRN
  Administered 2018-12-24: 1 via ORAL
  Filled 2018-12-24: qty 1

## 2018-12-24 MED ORDER — WARFARIN - PHARMACIST DOSING INPATIENT
Freq: Every day | Status: DC
  Administered 2018-12-24: 20:00:00
  Filled 2018-12-24: qty 1

## 2018-12-24 MED ORDER — INSULIN GLARGINE 100 UNIT/ML ~~LOC~~ SOLN: 10.0000 [IU] | Freq: Every day | SUBCUTANEOUS | Status: DC

## 2018-12-24 NOTE — Consult Note (Signed)
[facility] Cardiology  CARDIOLOGY CONSULT NOTE  Patient ID: Gina Chambers MRN: [id number] DOB/AGE: 1940-10-29 78 y.o.  Admit date: 12/24/2018 Referring Physician Dr. Otila Back Primary Physician Dr. Ouida Sills  Primary Cardiologist Dr. 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  Workup in the ED included ECG revealing normal sinus rhythm with no evidence of acute ischemic changes, arrhythmias, or ectopy, chest xray negative for acute cardiopulmonary disease, and negative troponin x 2 at 9 and 8 respectively.    She reports that she had 8/10, left-sided chest pain that woke her from her sleep.  Pain radiated to her left shoulder and to her left shoulder blade.  Symptoms lasted approximately 4 hours and have since resolved.  She denies shortness of breath, palpitations, or current chest pain.   She is followed in outpatient cardiology by Dr. Saralyn Pilar.  Most recent echocardiogram on 05/13/16 revealed normal LV systolic function with an EF estimated between 65-70% with mild AS.   Review of systems complete and found to be negative unless listed above     Past Medical History:  Diagnosis Date  . Asthma   . Asthmatic bronchitis , chronic ([place])   . CHF (congestive heart failure) ([place])   . Diabetes mellitus without complication ([place])   . 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 last one approx 2013  . Family history of adverse reaction to anesthesia    Mother - temporary paralysis of 1 side  . GERD (gastroesophageal reflux disease)   . Hypertension    . Neuropathy    feet  . PONV (postoperative nausea and vomiting)    Pt reports violent vomiting with ANY pain meds given with anesthesia.  . Sleep apnea    CPAP  . Thyroid disease   . Vertigo    daily    Past Surgical History:  Procedure Laterality Date  . CARDIAC SURGERY    . CATARACT EXTRACTION W/PHACO Right 03/11/2017   Procedure: CATARACT EXTRACTION PHACO AND INTRAOCULAR LENS PLACEMENT ([place])  RIGHT DIABETIC;  Surgeon: Eulogio Bear, MD;  Location: [place];  Service: Ophthalmology;  Laterality: Right;  Diabetic - insulin sleep apnea  . CATARACT EXTRACTION W/PHACO Left 04/02/2017   Procedure: CATARACT EXTRACTION PHACO AND INTRAOCULAR LENS PLACEMENT ([place]) LEFT DIABETES;  Surgeon: Eulogio Bear, MD;  Location: [place];  Service: Ophthalmology;  Laterality: Left;  Diabetic - insulin  . COLONOSCOPY WITH PROPOFOL N/A 11/09/2016   Procedure: COLONOSCOPY WITH PROPOFOL;  Surgeon: Jonathon Bellows, MD;  Location: [facility] ENDOSCOPY;  Service: Gastroenterology;  Laterality: N/A;  . HERNIA REPAIR    . VERTICAL BANDED GASTROPLASTY      (Not in a hospital admission)  Social History   Socioeconomic History  . Marital status: Single    Spouse name: Not on file  . Number of children: Not on file  . Years of education: Not on file  . Highest education level: Not on file  Occupational History  . Not on file  Social Needs  . Financial resource strain: Not on file  . Food insecurity  Worry: Not on file    Inability: Not on file  . Transportation needs    Medical: Not on file    Non-medical: Not on file  Tobacco Use  . Smoking status: Never Smoker  . Smokeless tobacco: Never Used  Substance and Sexual Activity  . Alcohol use: No  . Drug use: No  . Sexual activity: Not on file  Lifestyle  . Physical activity    Days per week: Not on file    Minutes per session: Not on file  . Stress: Not on file  Relationships  . Social Herbalist on phone:  Not on file    Gets together: Not on file    Attends religious service: Not on file    Active member of club or organization: Not on file    Attends meetings of clubs or organizations: Not on file    Relationship status: Not on file  . Intimate partner violence    Fear of current or ex partner: Not on file    Emotionally abused: Not on file    Physically abused: Not on file    Forced sexual activity: Not on file  Other Topics Concern  . Not on file  Social History Narrative  . Not on file    Family History  Problem Relation Age of Onset  . Breast cancer Mother        38's  . Breast cancer Maternal Grandmother   . Breast cancer Paternal Grandmother       Review of systems complete and found to be negative unless listed above    PHYSICAL EXAM  General: Well developed, obese, in no acute distress HEENT:  Normocephalic and atramatic Neck:  No JVD.  Lungs: Clear bilaterally to auscultation and percussion. Heart: HRRR . Normal S1 and S2 without gallops or murmurs.  Abdomen: Bowel sounds are positive, abdomen soft and non-tender  Msk:  Back normal. Normal strength and tone for age. Extremities: No clubbing or cyanosis. Mild peripheral edema noted in bilateral lower extremities  Neuro: Alert and oriented X 3. Psych:  Good affect, responds appropriately  Labs:   Lab Results  Component Value Date   WBC 8.5 12/24/2018   HGB 12.5 12/24/2018   HCT 38.3 12/24/2018   MCV 89.5 12/24/2018   PLT 222 12/24/2018    Recent Labs  Lab 12/24/18 0512  NA 139  K 3.7  CL 105  CO2 25  BUN 28*  CREATININE 0.88  CALCIUM 9.4  GLUCOSE 137*   Lab Results  Component Value Date   TROPONINI <0.03 11/06/2017   No results found for: CHOL No results found for: HDL No results found for: LDLCALC No results found for: TRIG No results found for: CHOLHDL No results found for: LDLDIRECT    Radiology: Dg Chest 2 View  Result Date: 12/24/2018 CLINICAL DATA:  New onset chest pain extending  into the left upper extremity. EXAM: CHEST - 2 VIEW COMPARISON:  One-view chest x-ray 11/06/2017 FINDINGS: The patient is status post median sternotomy for CABG, aortic valve replacement, and mitral annular repair. Heart size is normal. There is no edema or effusion. No focal airspace disease is present. Metallic wires are present over the anterior surface of the chest. IMPRESSION: 1. Status post cardiac surgery. 2. No acute cardiopulmonary disease. Electronically Signed   By: San Morelle M.D.   On: 12/24/2018 05:33    EKG: Normal sinus rhythm with a ventricular rate of 80bpm; no evidence of  acute ischemia   ASSESSMENT AND PLAN:  1.  Chest pain  -Troponin negative x 2, ECG without evidence of acute ischemic changes  -Will assess with Lexiscan stress test for further evaluation  2.  History of aortic and mitral valve disease s/p replacement   -Done in [place] in 2011; most recent echocardiogram in 2018  -Will repeat echocardiogram to assess valve function  3. History of recurrent DVTs  -Continue warfarin  4.  Type 2 diabetes  -Continue sliding scale insulin   The history, physical exam findings, and plan of care were all discussed with Dr. Bartholome Bill, and all decision making was made in collaboration.   Signed: Avie Arenas PA-C 12/24/2018, 9:02 AM

## 2018-12-24 NOTE — ED Notes (Signed)
Son in law called at [phone] asking for information on pt. Told son in law I would call him back. Talked to pt and referred her to call him back and give him information. Pt calling him now.

## 2018-12-24 NOTE — ED Notes (Signed)
PT's daughter called for update.  She is concerned that her medications (cardopa-levadopa) are the cause for the chest pain.  Update on POC given, verbalizes understanding of same.

## 2018-12-24 NOTE — ED Notes (Signed)
ED TO INPATIENT HANDOFF REPORT  ED Nurse Name and Phone #: Anderson Malta [phone]  S Name/Age/Gender Gina Chambers 78 y.o. female Room/Bed: ED30A/ED30A  Code Status   Code Status: Full Code  Home/SNF/Other Home Patient oriented to: self, place, time and situation Is this baseline? Yes   Triage Complete: Triage complete  Chief Complaint EMS-Chest Pain  Triage Note Patient to the ER for c/o mid to left chest pain that started early this am. Patient also reports pain radiating to under left arm and left shoulder blade. Denies any N/V, diaphoresis, or dizziness. Reports mild shortness of breath at time pain started, none currently. Patient reports starting new medication recently for restless leg, but otherwise no recent changes or illnesses.    Allergies Allergies  Allergen Reactions  . Ropinirole Nausea And Vomiting  . Bupropion     GI issues   . Calcium     Chest pain   . Cefuroxime     Swelling   . Cephalexin   . Clinoril [Sulindac]     GI  . Codeine Hives  . Duloxetine     Bleeding   . Ezetimibe     Joint pain   . Fenofibrate     Leg cramps  . Furosemide     Fluid retention   . Glipizide     Bloating   . Lorazepam     SI  . Lortab [Hydrocodone-Acetaminophen] Hives  . Metaproterenol     Palpitations   . Morphine And Related   . Nalbuphine     Rapid heart rate  flushing   . Naproxen     Numbness   . Norfloxacin     Urinary retention   . Rofecoxib   . Sodium   . Statins     Muscle pain   . Sulfa Antibiotics     Unknown   . Tramadol   . Trazodone And Nefazodone   . Doxycycline Rash  . Iodine Rash  . Ketoprofen Rash  . Piroxicam Rash  . Tolmetin Rash    Level of Care/Admitting Diagnosis ED Disposition    ED Disposition Condition [facility] Area: [place] [100120]  Level of Care: Med-Surg [16]  Covid Evaluation: Asymptomatic Screening Protocol (No Symptoms)  Diagnosis: Chest pain [id number]  Admitting Physician: Christel Mormon [id number]  Attending Physician: Christel Mormon [id number]  PT Class (Do Not Modify): Observation [104]  PT Acc Code (Do Not Modify): Observation [10022]       B Medical/Surgery History Past Medical History:  Diagnosis Date  . Asthma   . Asthmatic bronchitis , chronic ([place])   . CHF (congestive heart failure) ([place])   . Diabetes mellitus without complication ([place])   . 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 last one approx 2013  . Family history of adverse reaction to anesthesia    Mother - temporary paralysis of 1 side  . GERD (gastroesophageal reflux disease)   . Hypertension   . Neuropathy    feet  . PONV (postoperative nausea and vomiting)    Pt reports violent vomiting with ANY pain meds given with anesthesia.  . Sleep apnea    CPAP  . Thyroid disease   . Vertigo    daily   Past Surgical History:  Procedure Laterality Date  . CARDIAC SURGERY    . CATARACT EXTRACTION W/PHACO Right 03/11/2017   Procedure: CATARACT EXTRACTION PHACO AND INTRAOCULAR LENS PLACEMENT ([facility])  RIGHT DIABETIC;  Surgeon: Eulogio Bear, MD;  Location: [place];  Service: Ophthalmology;  Laterality: Right;  Diabetic - insulin sleep apnea  . CATARACT EXTRACTION W/PHACO Left 04/02/2017   Procedure: CATARACT EXTRACTION PHACO AND INTRAOCULAR LENS PLACEMENT ([place]) LEFT DIABETES;  Surgeon: Eulogio Bear, MD;  Location: [place];  Service: Ophthalmology;  Laterality: Left;  Diabetic - insulin  . COLONOSCOPY WITH PROPOFOL N/A 11/09/2016   Procedure: COLONOSCOPY WITH PROPOFOL;  Surgeon: Jonathon Bellows, MD;  Location[facility] END[facility]COPY;  Service: Gastroenterology;  Laterality: N/A;  . HERNIA REPAIR    . VERTICAL BANDED GASTROPLASTY       A IV Location/Drains/Wounds Patient Lines/Drains/Airways Status   Active Line/Drains/Airways    None          Intake/Output Last 24 hours No intake or output data in the 24 hours ending 12/24/18  1702  Labs/Imaging Results for orders placed or performed during the hospital encounter of 12/24/18 (from the past 48 hour(s))  Basic metabolic panel     Status: Abnormal   Collection Time: 12/24/18  5:12 AM  Result Value Ref Range   Sodium 139 135 - 145 mmol/L   Potassium 3.7 3.5 - 5.1 mmol/L   Chloride 105 98 - 111 mmol/L   CO2 25 22 - 32 mmol/L   Glucose, Bld 137 (H) 70 - 99 mg/dL   BUN 28 (H) 8 - 23 mg/dL   Creatinine, Ser 0.88 0.44 - 1.00 mg/dL   Calcium 9.4 8.9 - 10.3 mg/dL   GFR calc non Af Amer >60 >60 mL/min   GFR calc Af Amer >60 >60 mL/min   Anion gap 9 5 - 15    Comment: Performed at [facility], [address] [place], [place] [address]  CBC     Status: None   Collection Time: 12/24/18  5:12 AM  Result Value Ref Range   WBC 8.5 4.0 - 10.5 K/uL   RBC 4.28 3.87 - 5.11 MIL/uL   Hemoglobin 12.5 12.0 - 15.0 g/dL   HCT 38.3 36.0 - 46.0 %   MCV 89.5 80.0 - 100.0 fL   MCH 29.2 26.0 - 34.0 pg   MCHC 32.6 30.0 - 36.0 g/dL   RDW 14.0 11.5 - 15.5 %   Platelets 222 150 - 400 K/uL   nRBC 0.0 0.0 - 0.2 %    Comment: Performed at [facility], [address], [place], [place] [address]  Troponin I (High Sensitivity)     Status: None   Collection Time: 12/24/18  5:12 AM  Result Value Ref Range   Troponin I (High Sensitivity) 9 <18 ng/L    Comment: (NOTE) Elevated high sensitivity troponin I (hsTnI) values and significant  changes across serial measurements may suggest ACS but many other  chronic and acute conditions are known to elevate hsTnI results.  Refer to the "Links" section for chest pain algorithms and additional  guidance. Performed at [facility], [address] [place], [place] [address]   Protime-INR (order if Patient is taking Coumadin / Warfarin)     Status: Abnormal   Collection Time: 12/24/18  5:12 AM  Result Value Ref Range   Prothrombin Time 22.4 (H) 11.4 - 15.2 seconds   INR 2.0 (H) 0.8 - 1.2    Comment: (NOTE) INR  goal varies based on device and disease states. Performed at [facility], [address] [place], [place] [address]   TSH     Status: None  Collection Time: 12/24/18  5:12 AM  Result Value Ref Range   TSH 1.768 0.350 - 4.500 uIU/mL    Comment: Performed by a 3rd Generation assay with a functional sensitivity of <=0.01 uIU/mL. Performed at [facility], [address] [place], [place] [address]   Hemoglobin A1c     Status: None   Collection Time: 12/24/18  5:12 AM  Result Value Ref Range   Hgb A1c MFr Bld 5.3 4.8 - 5.6 %    Comment: (NOTE) Pre diabetes:          5.7%-6.4% Diabetes:              >6.4% Glycemic control for   <7.0% adults with diabetes    Mean Plasma Glucose 105.41 mg/dL    Comment: Performed at [facility] [address] [place], [place] [address]  SARS Coronavirus 2 [facility] order, Performed in [facility] hospital lab) Nasopharyngeal Nasopharyngeal Swab     Status: None   Collection Time: 12/24/18  6:55 AM   Specimen: Nasopharyngeal Swab  Result Value Ref Range   SARS Coronavirus 2 NEGATIVE NEGATIVE    Comment: (NOTE) If result is NEGATIVE SARS-CoV-2 target nucleic acids are NOT DETECTED. The SARS-CoV-2 RNA is generally detectable in upper and lower  respiratory specimens during the acute phase of infection. The lowest  concentration of SARS-CoV-2 viral copies this assay can detect is 250  copies / mL. A negative result does not preclude SARS-CoV-2 infection  and should not be used as the sole basis for treatment or other  patient management decisions.  A negative result may occur with  improper specimen collection / handling, submission of specimen other  than nasopharyngeal swab, presence of viral mutation(s) within the  areas targeted by this assay, and inadequate number of viral copies  (<250 copies / mL). A negative result must be combined with clinical  observations, patient history, and epidemiological information. If  result is POSITIVE SARS-CoV-2 target nucleic acids are DETECTED. The SARS-CoV-2 RNA is generally detectable in upper and lower  respiratory specimens dur ing the acute phase of infection.  Positive  results are indicative of active infection with SARS-CoV-2.  Clinical  correlation with patient history and other diagnostic information is  necessary to determine patient infection status.  Positive results do  not rule out bacterial infection or co-infection with other viruses. If result is PRESUMPTIVE POSTIVE SARS-CoV-2 nucleic acids MAY BE PRESENT.   A presumptive positive result was obtained on the submitted specimen  and confirmed on repeat testing.  While 2019 novel coronavirus  (SARS-CoV-2) nucleic acids may be present in the submitted sample  additional confirmatory testing may be necessary for epidemiological  and / or clinical management purposes  to differentiate between  SARS-CoV-2 and other Sarbecovirus currently known to infect humans.  If clinically indicated additional testing with an alternate test  methodology [phone]) is advised. The SARS-CoV-2 RNA is generally  detectable in upper and lower respiratory sp ecimens during the acute  phase of infection. The expected result is Negative. Fact Sheet for Patients:  StrictlyIdeas.no Fact Sheet for Healthcare Providers: BankingDealers.co.za This test is not yet approved or cleared by the [place] FDA and has been authorized for detection and/or diagnosis of SARS-CoV-2 by FDA under an Emergency Use Authorization (EUA).  This EUA will remain in effect (meaning this test can be used) for the duration of the COVID-19 declaration under Section 564(b)(1) of the Act, 21 U.S.C. section 360bbb-3(b)(1), unless the authorization  is terminated or revoked sooner. Performed at [facility], [place], [place] [address]   Troponin I (High Sensitivity)     Status:  None   Collection Time: 12/24/18  7:13 AM  Result Value Ref Range   Troponin I (High Sensitivity) 8 <18 ng/L    Comment: (NOTE) Elevated high sensitivity troponin I (hsTnI) values and significant  changes across serial measurements may suggest ACS but many other  chronic and acute conditions are known to elevate hsTnI results.  Refer to the "Links" section for chest pain algorithms and additional  guidance. Performed at [facility], [address] [place], [place] [address]   Glucose, capillary     Status: Abnormal   Collection Time: 12/24/18  9:14 AM  Result Value Ref Range   Glucose-Capillary 132 (H) 70 - 99 mg/dL  Glucose, capillary     Status: Abnormal   Collection Time: 12/24/18  1:02 PM  Result Value Ref Range   Glucose-Capillary 121 (H) 70 - 99 mg/dL  Troponin I (High Sensitivity)     Status: None   Collection Time: 12/24/18  1:05 PM  Result Value Ref Range   Troponin I (High Sensitivity) 8 <18 ng/L    Comment: (NOTE) Elevated high sensitivity troponin I (hsTnI) values and significant  changes across serial measurements may suggest ACS but many other  chronic and acute conditions are known to elevate hsTnI results.  Refer to the "Links" section for chest pain algorithms and additional  guidance. Performed at [facility], [address] [place], [place] [address]   APTT     Status: Abnormal   Collection Time: 12/24/18  1:05 PM  Result Value Ref Range   aPTT 37 (H) 24 - 36 seconds    Comment:        IF BASELINE aPTT IS ELEVATED, SUGGEST PATIENT RISK ASSESSMENT BE USED TO DETERMINE APPROPRIATE ANTICOAGULANT THERAPY. Performed at [facility], [address] [place], [place] [address]    Dg Chest 2 View  Result Date: 12/24/2018 CLINICAL DATA:  New onset chest pain extending into the left upper extremity. EXAM: CHEST - 2 VIEW COMPARISON:  One-view chest x-ray 11/06/2017 FINDINGS: The patient is status post median sternotomy for CABG,  aortic valve replacement, and mitral annular repair. Heart size is normal. There is no edema or effusion. No focal airspace disease is present. Metallic wires are present over the anterior surface of the chest. IMPRESSION: 1. Status post cardiac surgery. 2. No acute cardiopulmonary disease. Electronically Signed   By: San Morelle M.D.   On: 12/24/2018 05:33    Pending Labs Unresulted Labs (From admission, onward)    Start     Ordered   12/25/18 [id number]  Basic metabolic panel  Tomorrow morning,   STAT     12/24/18 0804   12/25/18 0500  Magnesium  Tomorrow morning,   STAT     12/24/18 0804   12/25/18 0500  TSH  Tomorrow morning,   STAT     12/24/18 0806   12/25/18 0500  Protime-INR  Tomorrow morning,   STAT     12/24/18 0806   12/25/18 0500  Hemoglobin A1c  Tomorrow morning,   STAT     12/24/18 0808          Vitals/Pain Today's Vitals   12/24/18 0448 12/24/18 0456 12/24/18 0700 12/24/18 1303  BP:   123/61 (!) 124/53  Pulse:   78 83  Resp:   19 18  Temp:      TempSrc:      SpO2:   99% 98%  Weight: 84.4 kg     Height: 5\' 3"  (1.6 m)     PainSc:  1       Isolation Precautions No active isolations  Medications Medications  aspirin EC tablet 81 mg (81 mg Oral Given 12/24/18 0935)  metoprolol succinate (TOPROL-XL) 24 hr tablet 50 mg (50 mg Oral Not Given 12/24/18 0931)  pantoprazole (PROTONIX) EC tablet 20 mg (20 mg Oral Given 12/24/18 0934)  famotidine (PEPCID) tablet 20 mg (20 mg Oral Given 12/24/18 0935)  fesoterodine (TOVIAZ) tablet 4 mg (4 mg Oral Given 12/24/18 0935)  magnesium oxide (MAG-OX) tablet 400 mg (400 mg Oral Given 12/24/18 0934)  multivitamin-lutein (OCUVITE-LUTEIN) capsule (1 capsule Oral Given 12/24/18 0934)  vitamin E capsule 100 Units (100 Units Oral Given 12/24/18 0934)  montelukast (SINGULAIR) tablet 10 mg (has no administration in time range)  levothyroxine (SYNTHROID) tablet 125 mcg (125 mcg Oral Not Given 12/24/18 [id number])  acetaminophen (TYLENOL) tablet 650 mg  (has no administration in time range)  ondansetron (ZOFRAN) injection 4 mg (has no administration in time range)  alum & mag hydroxide-simeth (MAALOX/MYLANTA) 200-200-20 MG/5ML suspension 30 mL (30 mLs Oral Refused 12/24/18 0915)    And  lidocaine (XYLOCAINE) 2 % viscous mouth solution 15 mL (15 mLs Oral Refused 12/24/18 0916)  zolpidem (AMBIEN) tablet 5 mg (has no administration in time range)  insulin aspart (novoLOG) injection 0-15 Units (0 Units Subcutaneous Not Given 12/24/18 1305)  bumetanide (BUMEX) tablet 1 mg (1 mg Oral Given 12/24/18 0934)  bumetanide (BUMEX) tablet 0.5 mg (has no administration in time range)  insulin glargine (LANTUS) injection 20 Units (20 Units Subcutaneous Not Given 12/24/18 1240)  insulin glargine (LANTUS) injection 10 Units (has no administration in time range)  budesonide (PULMICORT) nebulizer solution 0.25 mg (0.25 mg Nebulization Not Given 12/24/18 0931)  ipratropium-albuterol (DUONEB) 0.5-2.5 (3) MG/3ML nebulizer solution 3 mL (has no administration in time range)  warfarin (COUMADIN) tablet 7.5 mg (has no administration in time range)  Warfarin - Pharmacist Dosing Inpatient (has no administration in time range)    Mobility walks Low fall risk   Focused Assessments Cardiac Assessment Handoff:    Lab Results  Component Value Date   TROPONINI <0.03 11/06/2017   No results found for: DDIMER Does the Patient currently have chest pain? No     R Recommendations: See Admitting Provider Note  Report given to:   Additional Notes:

## 2018-12-24 NOTE — Progress Notes (Signed)
Care Alignment Note  Advanced Directives Documents (Living Will, Power of Attorney) currently in the EHR no advanced directives documents available .  Has the patient discussed their wishes with their family/healthcare power of attorney no.  What does the patient/decision maker understand about their medical condition and the natural course of their disease.  Chest pain.  CAD status post bypass surgery/valve replacement surgery.  History of DVT.  What is the patient/decision maker's biggest fear or concern for the future pain and suffering   What is the most important goal for this patient should their health condition worsen longevity.  Current   Code Status: Full Code  Current code status has been reviewed/updated.  Time spent:18 minutes

## 2018-12-24 NOTE — Progress Notes (Signed)
PHARMACIST - PHYSICIAN ORDER COMMUNICATION  CONCERNING: P&T Medication Policy on Herbal Medications  DESCRIPTION:  This patient's order for:  Vitamin B Complex  has been noted.  This product(s) is classified as an "herbal" or natural product. Due to a lack of definitive safety studies or FDA approval, nonstandard manufacturing practices, plus the potential risk of unknown drug-drug interactions while on inpatient medications, the Pharmacy and Therapeutics Committee does not permit the use of "herbal" or natural products of this type within [facility].   ACTION TAKEN: The pharmacy department is unable to verify this order at this time and your patient has been informed of this safety policy. Please reevaluate patient's clinical condition at discharge and address if the herbal or natural product(s) should be resumed at that time.

## 2018-12-24 NOTE — H&P (Signed)
[place] [facility] [place] NAME: Gina Chambers    MR#:  [id number]  DATE OF BIRTH:  [birth year]  DATE OF ADMISSION:  12/24/2018  PRIMARY CARE PHYSICIAN: Kirk Ruths, MD   REQUESTING/REFERRING PHYSICIAN: Marjean Donna  CHIEF COMPLAINT:   Chief Complaint  Patient presents with  . Chest Pain    HISTORY OF PRESENT ILLNESS:  Gina Chambers  is a 78 y.o. female with a known history of DVT x2, hypertension, diabetes mellitus, CAD status post bypass surgery/valve replacement on Coumadin at [place] in 2013 who follows up with Dr. Saralyn Pilar presented to the emergency room with complaints of left-sided chest pain.  Chest pain started last night.  Severity was 8/10.  Nonradiating.  No associated nausea or vomiting.  No fevers.  No cough productive of sputum.  Chest pain nonreproducible by palpation.  Chest pain appears resolved already at the time of my evaluation.  Patient was evaluated in the emergency room and initial troponin negative.  Twelve-lead EKG done with normal sinus rhythm.  Medical service called to admit patient for further evaluation and management.  PAST MEDICAL HISTORY:   Past Medical History:  Diagnosis Date  . Asthma   . Asthmatic bronchitis , chronic ([place])   . CHF (congestive heart failure) ([place])   . Diabetes mellitus without complication ([place])   . 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 last one approx 2013  . Family history of adverse reaction to anesthesia    Mother - temporary paralysis of 1 side  . GERD (gastroesophageal reflux disease)   . Hypertension   . Neuropathy    feet  . PONV (postoperative nausea and vomiting)    Pt reports violent vomiting with ANY pain meds given with anesthesia.  . Sleep apnea    CPAP  . Thyroid disease   . Vertigo    daily    PAST SURGICAL HISTORY:   Past Surgical History:  Procedure Laterality Date  . CARDIAC SURGERY    . CATARACT EXTRACTION W/PHACO Right 03/11/2017    Procedure: CATARACT EXTRACTION PHACO AND INTRAOCULAR LENS PLACEMENT ([place])  RIGHT DIABETIC;  Surgeon: Eulogio Bear, MD;  Location: [place];  Service: Ophthalmology;  Laterality: Right;  Diabetic - insulin sleep apnea  . CATARACT EXTRACTION W/PHACO Left 04/02/2017   Procedure: CATARACT EXTRACTION PHACO AND INTRAOCULAR LENS PLACEMENT ([place]) LEFT DIABETES;  Surgeon: Eulogio Bear, MD;  Location: [place];  Service: Ophthalmology;  Laterality: Left;  Diabetic - insulin  . COLONOSCOPY WITH PROPOFOL N/A 11/09/2016   Procedure: COLONOSCOPY WITH PROPOFOL;  Surgeon: Jonathon Bellows, MD;  Location: [facility] ENDOSCOPY;  Service: Gastroenterology;  Laterality: N/A;  . HERNIA REPAIR    . VERTICAL BANDED GASTROPLASTY      SOCIAL HISTORY:   Social History   Tobacco Use  . Smoking status: Never Smoker  . Smokeless tobacco: Never Used  Substance Use Topics  . Alcohol use: No    FAMILY HISTORY:   Family History  Problem Relation Age of Onset  . Breast cancer Mother        44's  . Breast cancer Maternal Grandmother   . Breast cancer Paternal Grandmother     DRUG ALLERGIES:   Allergies  Allergen Reactions  . Ropinirole Nausea And Vomiting  . Bupropion     GI issues   . Calcium     Chest pain   . Cefuroxime     Swelling   .  Cephalexin   . Clinoril [Sulindac]     GI  . Codeine Hives  . Duloxetine     Bleeding   . Ezetimibe     Joint pain   . Fenofibrate     Leg cramps  . Furosemide     Fluid retention   . Glipizide     Bloating   . Lorazepam     SI  . Lortab [Hydrocodone-Acetaminophen] Hives  . Metaproterenol     Palpitations   . Morphine And Related   . Nalbuphine     Rapid heart rate  flushing   . Naproxen     Numbness   . Norfloxacin     Urinary retention   . Rofecoxib   . Sodium   . Statins     Muscle pain   . Sulfa Antibiotics     Unknown   . Tramadol   . Trazodone And Nefazodone   . Doxycycline Rash  . Iodine Rash  .  Ketoprofen Rash  . Piroxicam Rash  . Tolmetin Rash    REVIEW OF SYSTEMS:   Review of Systems  Constitutional: Negative for chills and fever.  HENT: Negative for hearing loss and tinnitus.   Eyes: Negative for blurred vision and double vision.  Respiratory: Negative for cough, hemoptysis and shortness of breath.   Cardiovascular: Positive for chest pain. Negative for leg swelling.  Gastrointestinal: Negative for heartburn and nausea.  Genitourinary: Negative for dysuria and urgency.  Musculoskeletal: Negative for myalgias and neck pain.  Skin: Negative for rash.  Neurological: Negative for dizziness and headaches.  Psychiatric/Behavioral: Negative for depression and hallucinations.    MEDICATIONS AT HOME:   Prior to Admission medications   Medication Sig Start Date End Date Taking? Authorizing Provider  aspirin EC 81 MG tablet Take 81 mg by mouth daily.    [provider]  B Complex Vitamins (VITAMIN B COMPLEX PO) Take by mouth.    [provider]  bumetanide (BUMEX) 0.5 MG tablet Take 0.5-1 mg by mouth daily. Take 2 tablets (1mg ) in the morning and 1 tablet (0.5 mg) in the evening.    [provider]  fluticasone (FLOVENT HFA) 110 MCG/ACT inhaler Inhale into the lungs. 09/13/16 11/07/21  [provider]  insulin glargine (LANTUS) 100 UNIT/ML injection Inject 10-20 Units into the skin at bedtime. 20 units in the morning and 10 at night    [provider]  insulin regular (NOVOLIN R,HUMULIN R) 250 units/2.1mL (100 units/mL) injection Inject 10 Units into the skin 3 (three) times daily before meals. Inject 10 units before meals per sliding scale.    [provider]  Ipratropium-Albuterol (COMBIVENT) 20-100 MCG/ACT AERS respimat Inhale 1 puff into the lungs 4 (four) times daily as needed for wheezing or shortness of breath. May take additional inhalations prn, not to exceed 6 inhalations in 24 hours.    [provider]   lansoprazole (PREVACID) 30 MG capsule Take 30 mg by mouth 2 (two) times daily.    [provider]  levothyroxine (SYNTHROID, LEVOTHROID) 125 MCG tablet Take 125 mcg by mouth daily.    [provider]  Magnesium 500 MG TABS Take 500 mg by mouth 3 (three) times daily.    [provider]  metoprolol succinate (TOPROL-XL) 50 MG 24 hr tablet Take 50 mg by mouth daily. Take with or immediately following a meal.    [provider]  montelukast (SINGULAIR) 10 MG tablet Take 10 mg by mouth at bedtime.  [provider]  Multiple Vitamins-Minerals (PRESERVISION AREDS 2 PO) Take 1 tablet by mouth 2 (two) times daily.    [provider]  ranitidine (ZANTAC) 150 MG capsule Take 150 mg by mouth 2 (two) times daily.    [provider]  tolterodine (DETROL LA) 4 MG 24 hr capsule Take 4 mg by mouth daily.    [provider]  VITAMIN E PO Take 100 Units by mouth daily.     [provider]  warfarin (COUMADIN) 5 MG tablet Take 5 mg by mouth daily.    [provider]      VITAL SIGNS:  Blood pressure 123/61, pulse 78, temperature 98.6 F (37 C), temperature source Oral, resp. rate 19, height 5\' 3"  (1.6 m), weight 84.4 kg, SpO2 99 %.  PHYSICAL EXAMINATION:  Physical Exam  GENERAL:  78 y.o.-year-old patient lying in the bed with no acute distress.  EYES: Pupils equal, round, reactive to light and accommodation. No scleral icterus. Extraocular muscles intact.  HEENT: Head atraumatic, normocephalic. Oropharynx and nasopharynx clear.  NECK:  Supple, no jugular venous distention. No thyroid enlargement, no tenderness.  LUNGS: Normal breath sounds bilaterally, no wheezing, rales,rhonchi or crepitation. No use of accessory muscles of respiration.  CARDIOVASCULAR: S1, S2 normal. No murmurs, rubs, or gallops.  ABDOMEN: Soft, nontender, nondistended. Bowel sounds present. No organomegaly or mass.  EXTREMITIES: No pedal edema,  cyanosis, or clubbing.  NEUROLOGIC: Cranial nerves II through XII are intact. Muscle strength 5/5 in all extremities. Sensation intact. Gait not checked.  PSYCHIATRIC: The patient is alert and oriented x 3.  SKIN: No obvious rash, lesion, or ulcer.   LABORATORY PANEL:   CBC Recent Labs  Lab 12/24/18 0512  WBC 8.5  HGB 12.5  HCT 38.3  PLT 222   ------------------------------------------------------------------------------------------------------------------  Chemistries  Recent Labs  Lab 12/24/18 0512  NA 139  K 3.7  CL 105  CO2 25  GLUCOSE 137*  BUN 28*  CREATININE 0.88  CALCIUM 9.4   ------------------------------------------------------------------------------------------------------------------  Cardiac Enzymes No results for input(s): TROPONINI in the last 168 hours. ------------------------------------------------------------------------------------------------------------------  RADIOLOGY:  Dg Chest 2 View  Result Date: 12/24/2018 CLINICAL DATA:  New onset chest pain extending into the left upper extremity. EXAM: CHEST - 2 VIEW COMPARISON:  One-view chest x-ray 11/06/2017 FINDINGS: The patient is status post median sternotomy for CABG, aortic valve replacement, and mitral annular repair. Heart size is normal. There is no edema or effusion. No focal airspace disease is present. Metallic wires are present over the anterior surface of the chest. IMPRESSION: 1. Status post cardiac surgery. 2. No acute cardiopulmonary disease. Electronically Signed   By: San Morelle M.D.   On: 12/24/2018 05:33      IMPRESSION AND PLAN:  Patient is a 78 year old female with history of diabetes mellitus, hypertension, DVT x2, CAD status post bypass surgery/valve replacement surgery at [place] in 2013 on anticoagulation with Coumadin who presented to the emergency room with complaints of chest pain.  1.  Chest pain Resolved.  Patient with risk factors including diabetes  mellitus, hypertension and prior history of CAD. Initial troponin negative.  Twelve-lead EKG with normal sinus rhythm.  Follow-up on next set of troponin to rule out acute coronary syndrome. Consulted cardiologist.  Patient follows up with Dr. Saralyn Pilar.  Dr. Ubaldo Glassing to see patient. Clinical probably T for PE very low.  Patient already on anticoagulation with Coumadin with INR of 2.0.  2.  Diabetes mellitus type 2 Continue insulin.  Placed  on sliding scale insulin coverage.  Glycosylated hemoglobin level in a.m.  3.  History of DVT x2 Patient already on anticoagulation with Coumadin.  Continue the same.  4.  Hypertension Blood pressure controlled  DVT prophylaxis; patient on Coumadin.   All the records are reviewed and case discussed with ED provider. Management plans discussed with the patient, and she is in agreement.  CODE STATUS: Full code  TOTAL TIME TAKING CARE OF THIS PATIENT: 50 minutes.    Tarell Schollmeyer M.D on 12/24/2018 at 8:08 AM  Between 7am to 6pm - Pager - [phone]  After 6pm go to [email or web address] - [employer] [place] [facility]  Office  [phone]  CC: Primary care physician; Kirk Ruths, MD   Note: This dictation was prepared with Dragon dictation along with smaller phrase technology. Any transcriptional errors that result from this process are unintentional.

## 2018-12-24 NOTE — ED Notes (Signed)
ED Provider at bedside. 

## 2018-12-24 NOTE — ED Provider Notes (Signed)
[facility] Emergency Department Provider Note  ____________________________________________   First MD Initiated Contact with Patient 12/24/18 [phone]     (approximate)  I have reviewed the triage vital signs and the nursing notes.   HISTORY  Chief Complaint Chest Pain    HPI Gina Chambers is a 78 y.o. female with diabetes, CHF, DVT on warfarin who presents with chest pain.  Patient endorses having sudden onset of chest pressure on the left side of her chest that started prior to arrival that was severe, constant, nothing makes it better, nothing makes it worse.  Patient was concerned that she might be having a heart attack.  Before this she is having intermittent scapula pain but seems to be separate pain from the pain she presented with today.  She denies any shortness of breath, fevers, arm or leg weakness.  Although the pain has gotten better it has not completely gone away.          Past Medical History:  Diagnosis Date  . Asthma   . Asthmatic bronchitis , chronic ([place])   . CHF (congestive heart failure) ([place])   . Diabetes mellitus without complication ([place])   . 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 last one approx 2013  . Family history of adverse reaction to anesthesia    Mother - temporary paralysis of 1 side  . GERD (gastroesophageal reflux disease)   . Hypertension   . Neuropathy    feet  . PONV (postoperative nausea and vomiting)    Pt reports violent vomiting with ANY pain meds given with anesthesia.  . Sleep apnea    CPAP  . Thyroid disease   . Vertigo    daily    Patient Active Problem List   Diagnosis Date Noted  . Hypoglycemia 11/07/2017  . GIB (gastrointestinal bleeding) 11/07/2016  . COPD exacerbation ([place]) 05/12/2016  . Community acquired pneumonia 05/12/2016  . Leukocytosis 05/12/2016  . Generalized weakness 05/12/2016    Past Surgical History:  Procedure Laterality Date  . CARDIAC SURGERY    .  CATARACT EXTRACTION W/PHACO Right 03/11/2017   Procedure: CATARACT EXTRACTION PHACO AND INTRAOCULAR LENS PLACEMENT ([place])  RIGHT DIABETIC;  Surgeon: Eulogio Bear, MD;  Location: [place];  Service: Ophthalmology;  Laterality: Right;  Diabetic - insulin sleep apnea  . CATARACT EXTRACTION W/PHACO Left 04/02/2017   Procedure: CATARACT EXTRACTION PHACO AND INTRAOCULAR LENS PLACEMENT ([place]) LEFT DIABETES;  Surgeon: Eulogio Bear, MD;  Location: [place];  Service: Ophthalmology;  Laterality: Left;  Diabetic - insulin  . COLONOSCOPY WITH PROPOFOL N/A 11/09/2016   Procedure: COLONOSCOPY WITH PROPOFOL;  Surgeon: Jonathon Bellows, MD;  Location: [facility] ENDOSCOPY;  Service: Gastroenterology;  Laterality: N/A;  . HERNIA REPAIR    . VERTICAL BANDED GASTROPLASTY      Prior to Admission medications   Medication Sig Start Date End Date Taking? Authorizing Provider  aspirin EC 81 MG tablet Take 81 mg by mouth daily.    [provider]  B Complex Vitamins (VITAMIN B COMPLEX PO) Take by mouth.    [provider]  bumetanide (BUMEX) 0.5 MG tablet Take 0.5-1 mg by mouth daily. Take 2 tablets (1mg ) in the morning and 1 tablet (0.5 mg) in the evening.    [provider]  fluticasone (FLOVENT HFA) 110 MCG/ACT inhaler Inhale into the lungs. 09/13/16 11/07/21  [provider]  insulin glargine (LANTUS) 100 UNIT/ML injection Inject 10-20 Units into  the skin at bedtime. 20 units in the morning and 10 at night    [provider]  insulin regular (NOVOLIN R,HUMULIN R) 250 units/2.15mL (100 units/mL) injection Inject 10 Units into the skin 3 (three) times daily before meals. Inject 10 units before meals per sliding scale.    [provider]  Ipratropium-Albuterol (COMBIVENT) 20-100 MCG/ACT AERS respimat Inhale 1 puff into the lungs 4 (four) times daily as needed for wheezing or shortness of breath. May take additional inhalations prn, not to exceed 6  inhalations in 24 hours.    [provider]  lansoprazole (PREVACID) 30 MG capsule Take 30 mg by mouth 2 (two) times daily.    [provider]  levothyroxine (SYNTHROID, LEVOTHROID) 125 MCG tablet Take 125 mcg by mouth daily.    [provider]  Magnesium 500 MG TABS Take 500 mg by mouth 3 (three) times daily.    [provider]  metoprolol succinate (TOPROL-XL) 50 MG 24 hr tablet Take 50 mg by mouth daily. Take with or immediately following a meal.    [provider]  montelukast (SINGULAIR) 10 MG tablet Take 10 mg by mouth at bedtime.    [provider]  Multiple Vitamins-Minerals (PRESERVISION AREDS 2 PO) Take 1 tablet by mouth 2 (two) times daily.    [provider]  ranitidine (ZANTAC) 150 MG capsule Take 150 mg by mouth 2 (two) times daily.    [provider]  tolterodine (DETROL LA) 4 MG 24 hr capsule Take 4 mg by mouth daily.    [provider]  VITAMIN E PO Take 100 Units by mouth daily.     [provider]  warfarin (COUMADIN) 5 MG tablet Take 5 mg by mouth daily.    [provider]    Allergies Ropinirole, Bupropion, Calcium, Cefuroxime, Cephalexin, Clinoril [sulindac], Codeine, Duloxetine, Ezetimibe, Fenofibrate, Furosemide, Glipizide, Lorazepam, Lortab [hydrocodone-acetaminophen], Metaproterenol, Morphine and related, Nalbuphine, Naproxen, Norfloxacin, Rofecoxib, Sodium, Statins, Sulfa antibiotics, Tramadol, Trazodone and nefazodone, Doxycycline, Iodine, Ketoprofen, Piroxicam, and Tolmetin  Family History  Problem Relation Age of Onset  . Breast cancer Mother        77's  . Breast cancer Maternal Grandmother   . Breast cancer Paternal Grandmother     Social History Social History   Tobacco Use  . Smoking status: Never Smoker  . Smokeless tobacco: Never Used  Substance Use Topics  . Alcohol use: No  . Drug use: No      Review of Systems Constitutional: No fever/chills  Eyes: No visual changes. ENT: No sore throat. Cardiovascular: Positive chest pain Respiratory: Denies shortness of breath. Gastrointestinal: No abdominal pain.  No nausea, no vomiting.  No diarrhea.  No constipation. Genitourinary: Negative for dysuria. Musculoskeletal: Negative for back pain. Skin: Negative for rash. Neurological: Negative for headaches, focal weakness or numbness. All other ROS negative ____________________________________________   PHYSICAL EXAM:  VITAL SIGNS: ED Triage Vitals  Enc Vitals Group     BP 12/24/18 0447 (!) 168/68     Pulse Rate 12/24/18 0447 83     Resp 12/24/18 0447 16     Temp 12/24/18 0447 98.6 F (37 C)     Temp Source 12/24/18 0447 Oral     SpO2 12/24/18 0447 97 %     Weight 12/24/18 0448 186 lb (84.4 kg)     Height 12/24/18 0448 5\' 3"  (1.6 m)     Head Circumference --      Peak Flow --  Pain Score 12/24/18 0456 1     Pain Loc --      Pain Edu? --      Excl. in [place]? --     Constitutional: Alert and oriented. Well appearing and in no acute distress. Eyes: Conjunctivae are normal. EOMI. Head: Atraumatic. Nose: No congestion/rhinnorhea. Mouth/Throat: Mucous membranes are moist.   Neck: No stridor. Trachea Midline. FROM Cardiovascular: Normal rate, regular rhythm. Grossly normal heart sounds.  Good peripheral circulation. Respiratory: Normal respiratory effort.  No retractions. Lungs CTAB. Gastrointestinal: Soft and nontender. No distention. No abdominal bruits.  Musculoskeletal: No lower extremity tenderness nor edema.  No joint effusions. Neurologic:  Normal speech and language. No gross focal neurologic deficits are appreciated.  Skin:  Skin is warm, dry and intact. No rash noted. Psychiatric: Mood and affect are normal. Speech and behavior are normal. GU: Deferred   ____________________________________________   LABS (all labs ordered are listed, but only abnormal results are displayed)  Labs Reviewed  BASIC METABOLIC  PANEL - Abnormal; Notable for the following components:      Result Value   Glucose, Bld 137 (*)    BUN 28 (*)    All other components within normal limits  PROTIME-INR - Abnormal; Notable for the following components:   Prothrombin Time 22.4 (*)    INR 2.0 (*)    All other components within normal limits  CBC  TROPONIN I (HIGH SENSITIVITY)  TROPONIN I (HIGH SENSITIVITY)   ____________________________________________   ED ECG REPORT I, Vanessa [place], the attending physician, personally viewed and interpreted this ECG.  EKG is normal sinus rate of 80, no ST elevation, no T wave inversions, normal intervals. ____________________________________________  RADIOLOGY Robert Bellow, personally viewed and evaluated these images (plain radiographs) as part of my medical decision making, as well as reviewing the written report by the radiologist.  ED MD interpretation:  No PNA   Official radiology report(s): Dg Chest 2 View  Result Date: 12/24/2018 CLINICAL DATA:  New onset chest pain extending into the left upper extremity. EXAM: CHEST - 2 VIEW COMPARISON:  One-view chest x-ray 11/06/2017 FINDINGS: The patient is status post median sternotomy for CABG, aortic valve replacement, and mitral annular repair. Heart size is normal. There is no edema or effusion. No focal airspace disease is present. Metallic wires are present over the anterior surface of the chest. IMPRESSION: 1. Status post cardiac surgery. 2. No acute cardiopulmonary disease. Electronically Signed   By: San Morelle M.D.   On: 12/24/2018 05:33    ____________________________________________   PROCEDURES  Procedure(s) performed (including Critical Care):  Procedures   ____________________________________________   INITIAL IMPRESSION / ASSESSMENT AND PLAN / ED COURSE  Gina Chambers was evaluated in Emergency Department on 12/24/2018 for the symptoms described in the history of present illness. She was evaluated  in the context of the global COVID-19 pandemic, which necessitated consideration that the patient might be at risk for infection with the SARS-CoV-2 virus that causes COVID-19. Institutional protocols and algorithms that pertain to the evaluation of patients at risk for COVID-19 are in a state of rapid change based on information released by regulatory bodies including the CDC and federal and state organizations. These policies and algorithms were followed during the patient's care in the ED.    Patient is a 78 year old who presents with left-sided chest pain.  Patient is high risk for cardiac disease.  Patient had a prior CABG in 2013.  No catheterization since and no stress  test for 3 years.  Will get cardiac markers anticipate patient will need admission for high risk chest pain.  Lower suspicion for PE given his arterial blood thinner.  Low suspicion for dissection given patient is equal pulses in her feet and her wrist and the description of the pain.      INR 2.0.  Kidney function normal. Trop 9.   Chest xray negative for PNA.   Pt has high heart score with chest pressure sensation.  D/w hospital team for admission given high risk chest pain.  ____________________________________________   FINAL CLINICAL IMPRESSION(S) / ED DIAGNOSES   Final diagnoses:  Chest pain, unspecified type      MEDICATIONS GIVEN DURING THIS VISIT:  Medications - No data to display   ED Discharge Orders    None       Note:  This document was prepared using Dragon voice recognition software and may include unintentional dictation errors.   Vanessa [place], MD 12/24/18 [phone]

## 2018-12-24 NOTE — Consult Note (Addendum)
ANTICOAGULATION CONSULT NOTE - Initial Consult  Pharmacy Consult for Warfarin Dosing Indication: mechanical aortic valve replacement.  Allergies  Allergen Reactions  . Ropinirole Nausea And Vomiting  . Bupropion     GI issues   . Calcium     Chest pain   . Cefuroxime     Swelling   . Cephalexin   . Clinoril [Sulindac]     GI  . Codeine Hives  . Duloxetine     Bleeding   . Ezetimibe     Joint pain   . Fenofibrate     Leg cramps  . Furosemide     Fluid retention   . Glipizide     Bloating   . Lorazepam     SI  . Lortab [Hydrocodone-Acetaminophen] Hives  . Metaproterenol     Palpitations   . Morphine And Related   . Nalbuphine     Rapid heart rate  flushing   . Naproxen     Numbness   . Norfloxacin     Urinary retention   . Rofecoxib   . Sodium   . Statins     Muscle pain   . Sulfa Antibiotics     Unknown   . Tramadol   . Trazodone And Nefazodone   . Doxycycline Rash  . Iodine Rash  . Ketoprofen Rash  . Piroxicam Rash  . Tolmetin Rash    Patient Measurements: Height: 5\' 3"  (160 cm) Weight: 186 lb (84.4 kg) IBW/kg (Calculated) : 52.4 Heparin Dosing Weight: 71.2 kg  Vital Signs: Temp: 98.6 F (37 C) (09/02 0447) Temp Source: Oral (09/02 0447) BP: 123/61 (09/02 0700) Pulse Rate: 78 (09/02 0700)  Labs: Recent Labs    12/24/18 0512 12/24/18 0713  HGB 12.5  --   HCT 38.3  --   PLT 222  --   LABPROT 22.4*  --   INR 2.0*  --   CREATININE 0.88  --   TROPONINIHS 9 8    Estimated Creatinine Clearance: 56 mL/min (by C-G formula based on SCr of 0.88 mg/dL).   Medical History: Past Medical History:  Diagnosis Date  . Asthma   . Asthmatic bronchitis , chronic ([place])   . CHF (congestive heart failure) ([place])   . Diabetes mellitus without complication ([place])   . 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 last one approx 2013  . Family history of adverse reaction to anesthesia    Mother - temporary paralysis of 1 side  .  GERD (gastroesophageal reflux disease)   . Hypertension   . Neuropathy    feet  . PONV (postoperative nausea and vomiting)    Pt reports violent vomiting with ANY pain meds given with anesthesia.  . Sleep apnea    CPAP  . Thyroid disease   . Vertigo    daily    Medications:  (Not in a hospital admission)  Scheduled:  . alum & mag hydroxide-simeth  30 mL Oral Once   And  . lidocaine  15 mL Oral Once  . aspirin EC  81 mg Oral Daily  . budesonide (PULMICORT) nebulizer solution  0.25 mg Nebulization BID  . bumetanide  0.5 mg Oral QHS  . bumetanide  1 mg Oral Daily  . famotidine  20 mg Oral BID  . fesoterodine  4 mg Oral Daily  . insulin aspart  0-15 Units Subcutaneous TID WC  . insulin glargine  10 Units Subcutaneous QHS  . insulin glargine  20 Units Subcutaneous Daily  . levothyroxine  125 mcg Oral Daily  . magnesium oxide  400 mg Oral TID  . metoprolol succinate  50 mg Oral Q breakfast  . montelukast  10 mg Oral QHS  . multivitamin-lutein   Oral BID  . pantoprazole  20 mg Oral Daily  . vitamin E  100 Units Oral Daily   Infusions:   PRN: acetaminophen, ipratropium-albuterol, ondansetron (ZOFRAN) IV, zolpidem Anti-infectives (From admission, onward)   None      Assessment: Pharmacy has been consulted for Warfarin dosing with patient with aortic and mitral valve replacement in 2011 . Current PTA dosing is 5mg  PO daily. Patient is not entirely sure a dose was taken last night but stated has been regularly taking. Baseline labs have been ordered.  Goal of Therapy:  INR 2.5-3.5 Monitor platelets by anticoagulation protocol: Yes   Plan:  INR 2.0 subtherapeutic. Will order 7.5mg  Warfarin x 1 to be given this evening@1800 .   Will recheck INR with AM labs.  Meyer Arora A Dorella Laster 12/24/2018,10:11 AM

## 2018-12-24 NOTE — ED Triage Notes (Signed)
Patient to the ER for c/o mid to left chest pain that started early this am. Patient also reports pain radiating to under left arm and left shoulder blade. Denies any N/V, diaphoresis, or dizziness. Reports mild shortness of breath at time pain started, none currently. Patient reports starting new medication recently for restless leg, but otherwise no recent changes or illnesses.

## 2018-12-25 ENCOUNTER — Ambulatory Visit: Payer: Medicare Other

## 2018-12-25 ENCOUNTER — Encounter: Payer: Self-pay | Admitting: Radiology

## 2018-12-25 DIAGNOSIS — R0789 Other chest pain: Secondary | ICD-10-CM | POA: Diagnosis not present

## 2018-12-25 LAB — ECHOCARDIOGRAM COMPLETE
Height: 63 in
Weight: 2976 oz

## 2018-12-25 LAB — BASIC METABOLIC PANEL
Anion gap: 9 (ref 5–15)
BUN: 26 mg/dL — ABNORMAL HIGH (ref 8–23)
CO2: 27 mmol/L (ref 22–32)
Calcium: 9.1 mg/dL (ref 8.9–10.3)
Chloride: 105 mmol/L (ref 98–111)
Creatinine, Ser: 0.83 mg/dL (ref 0.44–1.00)
GFR calc Af Amer: >60 mL/min (ref >60)
GFR calc non Af Amer: >60 mL/min (ref >60)
Glucose, Bld: 138 mg/dL — ABNORMAL HIGH (ref 70–99)
Potassium: 3.6 mmol/L (ref 3.5–5.1)
Sodium: 141 mmol/L (ref 135–145)

## 2018-12-25 LAB — MAGNESIUM: Magnesium: 2.2 mg/dL (ref 1.7–2.4)

## 2018-12-25 LAB — GLUCOSE, CAPILLARY: Glucose-Capillary: 117 mg/dL — ABNORMAL HIGH (ref 70–99)

## 2018-12-25 LAB — HEMOGLOBIN A1C
Hgb A1c MFr Bld: 5.4 % (ref 4.8–5.6)
Mean Plasma Glucose: 108.28 mg/dL

## 2018-12-25 LAB — TSH: TSH: 1.302 u[IU]/mL (ref 0.350–4.500)

## 2018-12-25 LAB — PROTIME-INR
INR: 2.3 — ABNORMAL HIGH (ref 0.8–1.2)
Prothrombin Time: 25.1 seconds — ABNORMAL HIGH (ref 11.4–15.2)

## 2018-12-25 MED ORDER — WARFARIN SODIUM 5 MG PO TABS
5.0000 mg | ORAL_TABLET | Freq: Once | ORAL | Status: DC
  Filled 2018-12-25: qty 1

## 2018-12-25 MED ORDER — TECHNETIUM TC 99M TETROFOSMIN IV KIT
32.9900 | PACK | Freq: Once | INTRAVENOUS | Status: AC | PRN
  Administered 2018-12-25: 11:00:00 32.99 via INTRAVENOUS

## 2018-12-25 MED ORDER — TECHNETIUM TC 99M TETROFOSMIN IV KIT
10.0000 | PACK | Freq: Once | INTRAVENOUS | Status: AC | PRN
  Administered 2018-12-25: 10.15 via INTRAVENOUS

## 2018-12-25 MED ORDER — REGADENOSON 0.4 MG/5ML IV SOLN
0.4000 mg | Freq: Once | INTRAVENOUS | Status: AC
  Administered 2018-12-25: 10:00:00 0.4 mg via INTRAVENOUS

## 2018-12-25 NOTE — Progress Notes (Signed)
   12/25/18 1500  Clinical Encounter Type  Visited With Patient  Visit Type Initial;Spiritual support  Referral From Nurse  Spiritual Encounters  Spiritual Needs Prayer  Ch received an OR for prayer request. Pt was sitting in her bed resting after getting a stress test. She said the test wasn't as bad as she anticipated and was glad that it was over. 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 Pt emphasized over and over how God helped her out all the time. Pt also showed concerns for her three grandchildren Alda Ponder, and Merrilee Seashore who don't share the same religious faith as the pt does. Pt shared all the miraculous moments in her life where she felt God's guidance and presence. Pt requested a prayer and ch prayed with the pt for her heart health, and for her family. The pt in turn returned the prayer by praying for the wellbeing of the ch. The visit was appreciated. F/U plan includes continued spiritual support.

## 2018-12-25 NOTE — Consult Note (Signed)
ANTICOAGULATION CONSULT NOTE - Initial Consult  Pharmacy Consult for Warfarin Dosing Indication: mechanical aortic valve replacement.  Allergies  Allergen Reactions  . Ropinirole Nausea And Vomiting  . Bupropion     GI issues   . Calcium     Chest pain   . Cefuroxime     Swelling   . Cephalexin   . Clinoril [Sulindac]     GI  . Codeine Hives  . Duloxetine     Bleeding   . Ezetimibe     Joint pain   . Fenofibrate     Leg cramps  . Furosemide     Fluid retention   . Glipizide     Bloating   . Lorazepam     SI  . Lortab [Hydrocodone-Acetaminophen] Hives  . Metaproterenol     Palpitations   . Morphine And Related   . Nalbuphine     Rapid heart rate  flushing   . Naproxen     Numbness   . Norfloxacin     Urinary retention   . Rofecoxib   . Sodium   . Statins     Muscle pain   . Sulfa Antibiotics     Unknown   . Tramadol   . Trazodone And Nefazodone   . Doxycycline Rash  . Iodine Rash  . Ketoprofen Rash  . Piroxicam Rash  . Tolmetin Rash    Patient Measurements: Height: 5\' 3"  (160 cm) Weight: 186 lb (84.4 kg) IBW/kg (Calculated) : 52.4 Heparin Dosing Weight: 71.2 kg  Vital Signs: Temp: 98.6 F (37 C) (09/03 0436) BP: 125/64 (09/03 0436) Pulse Rate: 94 (09/03 0436)  Labs: Recent Labs    12/24/18 0512 12/24/18 0713 12/24/18 1305 12/25/18 0441  HGB 12.5  --   --   --   HCT 38.3  --   --   --   PLT 222  --   --   --   APTT  --   --  37*  --   LABPROT 22.4*  --   --  25.1*  INR 2.0*  --   --  2.3*  CREATININE 0.88  --   --  0.83  TROPONINIHS 9 8 8   --     Estimated Creatinine Clearance: 59.4 mL/min (by C-G formula based on SCr of 0.83 mg/dL).   Medical History: Past Medical History:  Diagnosis Date  . Asthma   . Asthmatic bronchitis , chronic ([place])   . CHF (congestive heart failure) ([place])   . Diabetes mellitus without complication ([place])   . 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 last one approx 2013  .  Family history of adverse reaction to anesthesia    Mother - temporary paralysis of 1 side  . GERD (gastroesophageal reflux disease)   . Hypertension   . Neuropathy    feet  . PONV (postoperative nausea and vomiting)    Pt reports violent vomiting with ANY pain meds given with anesthesia.  . Sleep apnea    CPAP  . Thyroid disease   . Vertigo    daily    Medications:  Medications Prior to Admission  Medication Sig Dispense Refill Last Dose  . aspirin EC 81 MG tablet Take 81 mg by mouth daily.   12/23/2018 at Unknown  . bumetanide (BUMEX) 0.5 MG tablet Take 0.5-1 mg by mouth See admin instructions. Take 2 tablets (1mg ) in the morning and 1 tablet (0.5 mg) in the evening  12/23/2018 at 0800  . carbidopa-levodopa (SINEMET IR) 25-100 MG tablet Take 1 tablet by mouth 3 (three) times daily.   12/23/2018 at Unknown time  . Chromium Picolinate 200 MCG CAPS Take 200 mcg by mouth daily.   24+ hours at Unknown  . fluticasone (FLOVENT HFA) 110 MCG/ACT inhaler Inhale 2 puffs into the lungs 2 (two) times daily.    12/23/2018 at Unknown  . insulin glargine (LANTUS) 100 UNIT/ML injection Inject 10-20 Units into the skin See admin instructions. Inject 20u under the skin every morning and inject 10u under the skin every night   12/23/2018 at 0800  . insulin regular (NOVOLIN R,HUMULIN R) 250 units/2.32mL (100 units/mL) injection Inject 10 Units into the skin 4 (four) times daily.    12/23/2018 at Unknown  . Ipratropium-Albuterol (COMBIVENT) 20-100 MCG/ACT AERS respimat Inhale 1 puff into the lungs 4 (four) times daily.    12/23/2018 at Unknown  . lansoprazole (PREVACID) 30 MG capsule Take 30 mg by mouth 2 (two) times daily.   12/23/2018 at 0800  . levothyroxine (SYNTHROID, LEVOTHROID) 125 MCG tablet Take 125 mcg by mouth daily.   12/23/2018 at 0700  . Magnesium 500 MG TABS Take 500 mg by mouth 4 (four) times daily.    12/23/2018 at Unknown  . metoprolol succinate (TOPROL-XL) 50 MG 24 hr tablet Take 50 mg by mouth daily. Take  with or immediately following a meal.   12/23/2018 at Unknown  . montelukast (SINGULAIR) 10 MG tablet Take 10 mg by mouth at bedtime.   24+ hours at Unknown  . Multiple Vitamins-Minerals (PRESERVISION AREDS 2 PO) Take 1 tablet by mouth 2 (two) times daily.   12/23/2018 at 0800  . tolterodine (DETROL LA) 4 MG 24 hr capsule Take 4 mg by mouth daily.   12/23/2018 at 0800  . warfarin (COUMADIN) 5 MG tablet Take 5 mg by mouth every evening.    24+ hours at Unknown   Scheduled:  . alum & mag hydroxide-simeth  30 mL Oral Once   And  . lidocaine  15 mL Oral Once  . aspirin EC  81 mg Oral Daily  . budesonide (PULMICORT) nebulizer solution  0.25 mg Nebulization BID  . bumetanide  0.5 mg Oral QHS  . bumetanide  1 mg Oral Daily  . famotidine  20 mg Oral BID  . fesoterodine  4 mg Oral Daily  . insulin aspart  0-15 Units Subcutaneous TID WC  . insulin glargine  10 Units Subcutaneous QHS  . insulin glargine  20 Units Subcutaneous Daily  . levothyroxine  125 mcg Oral Daily  . magnesium oxide  400 mg Oral TID  . metoprolol succinate  50 mg Oral Q breakfast  . montelukast  10 mg Oral QHS  . multivitamin-lutein   Oral BID  . pantoprazole  20 mg Oral Daily  . vitamin E  100 Units Oral Daily  . Warfarin - Pharmacist Dosing Inpatient   Does not apply q1800   Infusions:   PRN: acetaminophen, diphenhydrAMINE, ipratropium-albuterol, ondansetron (ZOFRAN) IV, senna-docusate Anti-infectives (From admission, onward)   None      Assessment: Pharmacy has been consulted for Warfarin dosing with patient with aortic and mitral valve replacement in 2011 . Current PTA dosing is 5mg  PO daily. Patient is not entirely sure a dose was taken last night but stated has been regularly taking. Baseline labs have been ordered.          INR   Warfarin   9/2  2.0     7.5 9/3   2.3  Goal of Therapy:  INR 2.5-3.5 Monitor platelets by anticoagulation protocol: Yes   Plan:  INR 2.3 still slightly subtherapeutic. Will order  5mg  Warfarin x 1 to be given this evening @1800     Will recheck INR with AM labs.  Lu Duffel, PharmD, BCPS Clinical Pharmacist 12/25/2018 8:26 AM

## 2018-12-25 NOTE — Progress Notes (Signed)
Patient is off unit for stress test. Madlyn Frankel, RN

## 2018-12-25 NOTE — TOC Initial Note (Signed)
Transition of Care [facility]) - Initial/Assessment Note    Patient Details  Name: NIVIA ALPAUGH MRN: [id number] Date of Birth: [birth year]  Transition [facility] CM/SW Contact:    Shelbie Hutching, RN Phone Number: 12/25/2018, 3:46 PM  Clinical Narrative:                 Patient placed under observation for chest pain, patient was down for stress test earlier today which showed no ischemic changes so cardiology has cleared patient for discharge.  Patient reports having some trouble affording inhalers but patient does have Medicare insurance and does not qualify for any assistance.  Encouraged patient to speak with doctors about alternative medications that may be cheaper.  Patient lives with her daughter and her daughter's husband, no other discharge needs identified.  Patient's daughter will pick her up when ready for discharge home.   Expected Discharge Plan: Home/Self Care Barriers to Discharge: Barriers Resolved   Patient Goals and CMS Choice        Expected Discharge Plan and Services Expected Discharge Plan: Home/Self Care   Discharge Planning Services: CM Consult   Living arrangements for the past 2 months: Single Family Home Expected Discharge Date: 12/25/18                                    Prior Living Arrangements/Services Living arrangements for the past 2 months: Single Family Home Lives with:: Adult Children Patient language and need for interpreter reviewed:: No Do you feel safe going back to the place where you live?: Yes      Need for Family Participation in Patient Care: No (Comment) Care giver support system in place?: Yes (comment)(daughter and son in law)   Criminal Activity/Legal Involvement Pertinent to Current Situation/Hospitalization: No - Comment as needed  Activities of Daily Living Home Assistive Devices/Equipment: None ADL Screening (condition at time of admission) Patient's cognitive ability adequate to safely complete daily  activities?: Yes Is the patient deaf or have difficulty hearing?: No Does the patient have difficulty seeing, even when wearing glasses/contacts?: No Does the patient have difficulty concentrating, remembering, or making decisions?: No Patient able to express need for assistance with ADLs?: Yes Does the patient have difficulty dressing or bathing?: No Independently performs ADLs?: Yes (appropriate for developmental age) Does the patient have difficulty walking or climbing stairs?: Yes Weakness of Legs: None Weakness of Arms/Hands: None  Permission Sought/Granted Permission sought to share information with : Case Manager Permission granted to share information with : Yes, Verbal Permission Granted              Emotional Assessment Appearance:: Appears stated age Attitude/Demeanor/Rapport: Engaged Affect (typically observed): Accepting Orientation: : Oriented to Self, Oriented to Place, Oriented to  Time, Oriented to Situation Alcohol / Substance Use: Not Applicable Psych Involvement: No (comment)  Admission diagnosis:  Chest pain, unspecified type [R07.9] Patient Active Problem List   Diagnosis Date Noted  . Chest pain 12/24/2018  . Hypoglycemia 11/07/2017  . GIB (gastrointestinal bleeding) 11/07/2016  . COPD exacerbation ([place]) 05/12/2016  . Community acquired pneumonia 05/12/2016  . Leukocytosis 05/12/2016  . Generalized weakness 05/12/2016   PCP:  Kirk Ruths, MD Pharmacy:   [id number] - [place], [place] - [address] [place] [address] Phone: [phone] Fax: [phone]  [facility] [place]  [place] [address] Phone: [phone] Fax: [phone]  [id number] - [place], [place] [address] [place] [address] Phone: [phone] Fax: [phone]     Social Determinants of Health (SDOH) Interventions    Readmission Risk  Interventions No flowsheet data found.

## 2018-12-25 NOTE — Progress Notes (Signed)
Stress sestimibi showed no evidence of ischemia. Low risk study.  Echo showed normal lv function with prosthetic valves functioning well with only very mild aortic stenosis. No significant cahnge in her echo from 2018. NO further inpatient cardiac workup indicated at present. Pt has ruled out for an mi. Would discharge on current cardiac meds.

## 2018-12-25 NOTE — Progress Notes (Signed)
Discharge instructions given and went over with patient at bedside. All questions answered. Patient discharged home. Gabrille Kilbride S, RN  

## 2018-12-25 NOTE — Discharge Summary (Signed)
[facility] [place] NAME: Gina Chambers    MR#:  [id number]  DATE OF BIRTH:  [birth year]  DATE OF ADMISSION:  12/24/2018 ADMITTING PHYSICIAN: Christel Mormon, MD  DATE OF DISCHARGE: 12/25/2018  PRIMARY CARE PHYSICIAN: Kirk Ruths, MD    ADMISSION DIAGNOSIS:  Chest pain, unspecified type [R07.9]  DISCHARGE DIAGNOSIS:  Active Problems:   Chest pain  SECONDARY DIAGNOSIS:   Past Medical History:  Diagnosis Date  . Asthma   . Asthmatic bronchitis , chronic ([place])   . CHF (congestive heart failure) ([place])   . Diabetes mellitus without complication ([place])   . 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 last one approx 2013  . Family history of adverse reaction to anesthesia    Mother - temporary paralysis of 1 side  . GERD (gastroesophageal reflux disease)   . Hypertension   . Neuropathy    feet  . PONV (postoperative nausea and vomiting)    Pt reports violent vomiting with ANY pain meds given with anesthesia.  . Sleep apnea    CPAP  . Thyroid disease   . Vertigo    daily    HOSPITAL COURSE:   Pt came with chest pain. She was seen by Cardiologist- stress test done and that was negative. No further work up needed, so cardiologist suggest discharge.  Pt was pain free and agreed on discharge.  DISCHARGE CONDITIONS:   Stable.  CONSULTS OBTAINED:  Treatment Team:  Teodoro Spray, MD  DRUG ALLERGIES:   Allergies  Allergen Reactions  . Ropinirole Nausea And Vomiting  . Bupropion     GI issues   . Calcium     Chest pain   . Cefuroxime     Swelling   . Cephalexin   . Clinoril [Sulindac]     GI  . Codeine Hives  . Duloxetine     Bleeding   . Ezetimibe     Joint pain   . Fenofibrate     Leg cramps  . Furosemide     Fluid retention   . Glipizide     Bloating   . Lorazepam     SI  . Lortab [Hydrocodone-Acetaminophen] Hives  . Metaproterenol     Palpitations   . Morphine And Related    . Nalbuphine     Rapid heart rate  flushing   . Naproxen     Numbness   . Norfloxacin     Urinary retention   . Rofecoxib   . Sodium   . Statins     Muscle pain   . Sulfa Antibiotics     Unknown   . Tramadol   . Trazodone And Nefazodone   . Doxycycline Rash  . Iodine Rash  . Ketoprofen Rash  . Piroxicam Rash  . Tolmetin Rash    DISCHARGE MEDICATIONS:   Allergies as of 12/25/2018      Reactions   Ropinirole Nausea And Vomiting   Bupropion    GI issues   Calcium    Chest pain   Cefuroxime    Swelling   Cephalexin    Clinoril [sulindac]    GI   Codeine Hives   Duloxetine    Bleeding   Ezetimibe    Joint pain   Fenofibrate    Leg cramps   Furosemide    Fluid retention    Glipizide    Bloating   Lorazepam  SI   Lortab [hydrocodone-acetaminophen] Hives   Metaproterenol    Palpitations    Morphine And Related    Nalbuphine    Rapid heart rate  flushing    Naproxen    Numbness   Norfloxacin    Urinary retention    Rofecoxib    Sodium    Statins    Muscle pain   Sulfa Antibiotics    Unknown   Tramadol    Trazodone And Nefazodone    Doxycycline Rash   Iodine Rash   Ketoprofen Rash   Piroxicam Rash   Tolmetin Rash      Medication List    TAKE these medications   aspirin EC 81 MG tablet Take 81 mg by mouth daily.   bumetanide 0.5 MG tablet Commonly known as: BUMEX Take 0.5-1 mg by mouth See admin instructions. Take 2 tablets (1mg ) in the morning and 1 tablet (0.5 mg) in the evening   carbidopa-levodopa 25-100 MG tablet Commonly known as: SINEMET IR Take 1 tablet by mouth 3 (three) times daily.   Chromium Picolinate 200 MCG Caps Take 200 mcg by mouth daily.   fluticasone 110 MCG/ACT inhaler Commonly known as: FLOVENT HFA Inhale 2 puffs into the lungs 2 (two) times daily.   insulin glargine 100 UNIT/ML injection Commonly known as: LANTUS Inject 10-20 Units into the skin See admin instructions. Inject 20u under the skin every  morning and inject 10u under the skin every night   insulin regular 100 units/mL injection Commonly known as: NOVOLIN R Inject 10 Units into the skin 4 (four) times daily.   Ipratropium-Albuterol 20-100 MCG/ACT Aers respimat Commonly known as: COMBIVENT Inhale 1 puff into the lungs 4 (four) times daily.   lansoprazole 30 MG capsule Commonly known as: PREVACID Take 30 mg by mouth 2 (two) times daily.   levothyroxine 125 MCG tablet Commonly known as: SYNTHROID Take 125 mcg by mouth daily.   Magnesium 500 MG Tabs Take 500 mg by mouth 4 (four) times daily.   metoprolol succinate 50 MG 24 hr tablet Commonly known as: TOPROL-XL Take 50 mg by mouth daily. Take with or immediately following a meal.   montelukast 10 MG tablet Commonly known as: SINGULAIR Take 10 mg by mouth at bedtime.   PRESERVISION AREDS 2 PO Take 1 tablet by mouth 2 (two) times daily.   tolterodine 4 MG 24 hr capsule Commonly known as: DETROL LA Take 4 mg by mouth daily.   warfarin 5 MG tablet Commonly known as: COUMADIN Take 5 mg by mouth every evening.        DISCHARGE INSTRUCTIONS:    Follow with PMD and cardiologist in 1-2 weeks.  If you experience worsening of your admission symptoms, develop shortness of breath, life threatening emergency, suicidal or homicidal thoughts you must seek medical attention immediately by calling 911 or calling your MD immediately  if symptoms less severe.  You Must read complete instructions/literature along with all the possible adverse reactions/side effects for all the Medicines you take and that have been prescribed to you. Take any new Medicines after you have completely understood and accept all the possible adverse reactions/side effects.   Please note  You were cared for by a hospitalist during your hospital stay. If you have any questions about your discharge medications or the care you received while you were in the hospital after you are discharged, you  can call the unit and asked to speak with the hospitalist on call if the hospitalist that  took care of you is not available. Once you are discharged, your primary care physician will handle any further medical issues. Please note that NO REFILLS for any discharge medications will be authorized once you are discharged, as it is imperative that you return to your primary care physician (or establish a relationship with a primary care physician if you do not have one) for your aftercare needs so that they can reassess your need for medications and monitor your lab values.   Today   CHIEF COMPLAINT:   Chief Complaint  Patient presents with  . Chest Pain    HISTORY OF PRESENT ILLNESS:  Arietta Troncoso  is a 78 y.o. female with a known history of DVT x2, hypertension, diabetes mellitus, CAD status post bypass surgery/valve replacement on Coumadin at [place] in 2013 who follows up with Dr. Saralyn Pilar presented to the emergency room with complaints of left-sided chest pain.  Chest pain started last night.  Severity was 8/10.  Nonradiating.  No associated nausea or vomiting.  No fevers.  No cough productive of sputum.  Chest pain nonreproducible by palpation.  Chest pain appears resolved already at the time of my evaluation.  Patient was evaluated in the emergency room and initial troponin negative.  Twelve-lead EKG done with normal sinus rhythm.  Medical service called to admit patient for further evaluation and management.  VITAL SIGNS:  Blood pressure 125/64, pulse 94, temperature 98.6 F (37 C), resp. rate 19, height 5\' 3"  (1.6 m), weight 84.4 kg, SpO2 97 %.  I/O:  No intake or output data in the 24 hours ending 12/25/18 1512  PHYSICAL EXAMINATION:  GENERAL:  78 y.o.-year-old patient lying in the bed with no acute distress.  EYES: Pupils equal, round, reactive to light and accommodation. No scleral icterus. Extraocular muscles intact.  HEENT: Head atraumatic, normocephalic. Oropharynx and nasopharynx  clear.  NECK:  Supple, no jugular venous distention. No thyroid enlargement, no tenderness.  LUNGS: Normal breath sounds bilaterally, no wheezing, rales,rhonchi or crepitation. No use of accessory muscles of respiration.  CARDIOVASCULAR: S1, S2 normal. No murmurs, rubs, or gallops.  ABDOMEN: Soft, non-tender, non-distended. Bowel sounds present. No organomegaly or mass.  EXTREMITIES: No pedal edema, cyanosis, or clubbing.  NEUROLOGIC: Cranial nerves II through XII are intact. Muscle strength 5/5 in all extremities. Sensation intact. Gait not checked.  PSYCHIATRIC: The patient is alert and oriented x 3.  SKIN: No obvious rash, lesion, or ulcer.   DATA REVIEW:   CBC Recent Labs  Lab 12/24/18 0512  WBC 8.5  HGB 12.5  HCT 38.3  PLT 222    Chemistries  Recent Labs  Lab 12/25/18 0441  NA 141  K 3.6  CL 105  CO2 27  GLUCOSE 138*  BUN 26*  CREATININE 0.83  CALCIUM 9.1  MG 2.2    Cardiac Enzymes No results for input(s): TROPONINI in the last 168 hours.  Microbiology Results  Results for orders placed or performed during the hospital encounter of 12/24/18  SARS Coronavirus 2 [facility] order, Performed in [facility] hospital lab) Nasopharyngeal Nasopharyngeal Swab     Status: None   Collection Time: 12/24/18  6:55 AM   Specimen: Nasopharyngeal Swab  Result Value Ref Range Status   SARS Coronavirus 2 NEGATIVE NEGATIVE Final    Comment: (NOTE) If result is NEGATIVE SARS-CoV-2 target nucleic acids are NOT DETECTED. The SARS-CoV-2 RNA is generally detectable in upper and lower  respiratory specimens during the acute phase of infection. The lowest  concentration of  SARS-CoV-2 viral copies this assay can detect is 250  copies / mL. A negative result does not preclude SARS-CoV-2 infection  and should not be used as the sole basis for treatment or other  patient management decisions.  A negative result may occur with  improper specimen collection / handling, submission of  specimen other  than nasopharyngeal swab, presence of viral mutation(s) within the  areas targeted by this assay, and inadequate number of viral copies  (<250 copies / mL). A negative result must be combined with clinical  observations, patient history, and epidemiological information. If result is POSITIVE SARS-CoV-2 target nucleic acids are DETECTED. The SARS-CoV-2 RNA is generally detectable in upper and lower  respiratory specimens dur ing the acute phase of infection.  Positive  results are indicative of active infection with SARS-CoV-2.  Clinical  correlation with patient history and other diagnostic information is  necessary to determine patient infection status.  Positive results do  not rule out bacterial infection or co-infection with other viruses. If result is PRESUMPTIVE POSTIVE SARS-CoV-2 nucleic acids MAY BE PRESENT.   A presumptive positive result was obtained on the submitted specimen  and confirmed on repeat testing.  While 2019 novel coronavirus  (SARS-CoV-2) nucleic acids may be present in the submitted sample  additional confirmatory testing may be necessary for epidemiological  and / or clinical management purposes  to differentiate between  SARS-CoV-2 and other Sarbecovirus currently known to infect humans.  If clinically indicated additional testing with an alternate test  methodology [phone]) is advised. The SARS-CoV-2 RNA is generally  detectable in upper and lower respiratory sp ecimens during the acute  phase of infection. The expected result is Negative. Fact Sheet for Patients:  StrictlyIdeas.no Fact Sheet for Healthcare Providers: BankingDealers.co.za This test is not yet approved or cleared by the [place] FDA and has been authorized for detection and/or diagnosis of SARS-CoV-2 by FDA under an Emergency Use Authorization (EUA).  This EUA will remain in effect (meaning this test can be used) for the  duration of the COVID-19 declaration under Section 564(b)(1) of the Act, 21 U.S.C. section 360bbb-3(b)(1), unless the authorization is terminated or revoked sooner. Performed at [facility], [address] [place], [place] [address]     RADIOLOGY:  Dg Chest 2 View  Result Date: 12/24/2018 CLINICAL DATA:  New onset chest pain extending into the left upper extremity. EXAM: CHEST - 2 VIEW COMPARISON:  One-view chest x-ray 11/06/2017 FINDINGS: The patient is status post median sternotomy for CABG, aortic valve replacement, and mitral annular repair. Heart size is normal. There is no edema or effusion. No focal airspace disease is present. Metallic wires are present over the anterior surface of the chest. IMPRESSION: 1. Status post cardiac surgery. 2. No acute cardiopulmonary disease. Electronically Signed   By: San Morelle M.D.   On: 12/24/2018 05:33    EKG:   Orders placed or performed during the hospital encounter of 12/24/18  . EKG 12-Lead  . EKG 12-Lead  . ED EKG  . ED EKG  . EKG 12-Lead (recurrent chest pain)    Management plans discussed with the patient, family and they are in agreement.  CODE STATUS: Full.    Code Status Orders  (From admission, onward)         Start     Ordered   12/24/18 0653  Full code  Continuous     12/24/18 0654        Code Status History  Date Active Date Inactive Code Status Order ID Comments User Context   11/07/2017 0221 11/07/2017 2000 Full Code [id number]  Arta Silence, MD Inpatient   11/07/2016 1851 11/09/2016 1945 Full Code [id number]  Demetrios Loll, MD Inpatient   05/12/2016 2034 05/14/2016 1617 Full Code [id number]  Theodoro Grist, MD Inpatient   Advance Care Planning Activity    Advance Directive Documentation     Most Recent Value  Type of Advance Directive  Healthcare Power of [place], Living will  Pre-existing out of facility DNR order (yellow form or pink MOST form)  -  "MOST" Form in Place?  -       TOTAL TIME TAKING CARE OF THIS PATIENT: 35 minutes.    Vaughan Basta M.D on 12/25/2018 at 3:12 PM  Between 7am to 6pm - Pager - [phone]  After 6pm go to [email or web address] - password [email or web address] [place]  Office  [phone]  CC: Primary care physician; Kirk Ruths, MD   Note: This dictation was prepared with Dragon dictation along with smaller phrase technology. Any transcriptional errors that result from this process are unintentional.

## 2019-01-07 LAB — NM MYOCAR MULTI W/SPECT W/WALL MOTION / EF
Estimated workload: 1 METS
Exercise duration (min): 1 min
Exercise duration (sec): 27 s
LV dias vol: 66 mL (ref 46–106)
LV sys vol: 21 mL
Peak HR: 83 {beats}/min
Rest HR: 82 {beats}/min
SDS: 1
SRS: 3
SSS: 2
TID: 2.64

## 2019-11-26 ENCOUNTER — Encounter: Payer: Self-pay | Admitting: Physical Therapy

## 2019-11-26 ENCOUNTER — Other Ambulatory Visit: Payer: Self-pay

## 2019-11-26 ENCOUNTER — Ambulatory Visit: Payer: Medicare Other | Attending: Sports Medicine | Admitting: Physical Therapy

## 2019-11-26 DIAGNOSIS — M25662 Stiffness of left knee, not elsewhere classified: Secondary | ICD-10-CM | POA: Insufficient documentation

## 2019-11-26 DIAGNOSIS — M25661 Stiffness of right knee, not elsewhere classified: Secondary | ICD-10-CM

## 2019-11-26 DIAGNOSIS — M6281 Muscle weakness (generalized): Secondary | ICD-10-CM | POA: Diagnosis present

## 2019-11-26 DIAGNOSIS — M25561 Pain in right knee: Secondary | ICD-10-CM | POA: Insufficient documentation

## 2019-11-26 DIAGNOSIS — G8929 Other chronic pain: Secondary | ICD-10-CM | POA: Insufficient documentation

## 2019-11-26 DIAGNOSIS — R2689 Other abnormalities of gait and mobility: Secondary | ICD-10-CM | POA: Insufficient documentation

## 2019-11-26 NOTE — Therapy (Deleted)
[facility] [address]. [place], [place], [address] Phone: [phone]   Fax:  [phone]  Physical Therapy Evaluation  Patient Details  Name: Gina Chambers MRN: [id number] Date of Birth: [birth year] Referring Provider (PT): Rosalia Hammers, DO   Encounter Date: 11/26/2019   PT End of Session - 11/26/19 1550    Visit Number 1    Number of Visits 9    Date for PT Re-Evaluation 12/24/19    Authorization - Visit Number 1    Authorization - Number of Visits 10    PT Start Time 1400    PT Stop Time 1445    PT Time Calculation (min) 45 min    Activity Tolerance Patient tolerated treatment well    Behavior During Therapy [facility] for tasks assessed/performed           Past Medical History:  Diagnosis Date  . Asthma   . Asthmatic bronchitis , chronic ([place])   . CHF (congestive heart failure) ([place])   . Diabetes mellitus without complication ([place])   . 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 last one approx 2013  . Family history of adverse reaction to anesthesia    Mother - temporary paralysis of 1 side  . GERD (gastroesophageal reflux disease)   . Hypertension   . Neuropathy    feet  . PONV (postoperative nausea and vomiting)    Pt reports violent vomiting with ANY pain meds given with anesthesia.  . Sleep apnea    CPAP  . Thyroid disease   . Vertigo    daily    Past Surgical History:  Procedure Laterality Date  . CARDIAC SURGERY    . CATARACT EXTRACTION W/PHACO Right 03/11/2017   Procedure: CATARACT EXTRACTION PHACO AND INTRAOCULAR LENS PLACEMENT ([place])  RIGHT DIABETIC;  Surgeon: Eulogio Bear, MD;  Location: [facility];  Service: Ophthalmology;  Laterality: Right;  Diabetic - insulin sleep apnea  . CATARACT EXTRACTION W/PHACO Left 04/02/2017   Procedure: CATARACT EXTRACTION PHACO AND INTRAOCULAR LENS PLACEMENT ([place]) LEFT DIABETES;  Surgeon: Eulogio Bear, MD;  Location: [facility];   Service: Ophthalmology;  Laterality: Left;  Diabetic - insulin  . COLONOSCOPY WITH PROPOFOL N/A 11/09/2016   Procedure: COLONOSCOPY WITH PROPOFOL;  Surgeon: Jonathon Bellows, MD;  Location: [facility] ENDOSCOPY;  Service: Gastroenterology;  Laterality: N/A;  . HERNIA REPAIR    . VERTICAL BANDED GASTROPLASTY      There were no vitals filed for this visit.    Subjective Assessment - 11/26/19 1531    Subjective Pt. is a 79 y.o. female with complaints of R knee and bilateral hip pain. Pt. reports that pain is at worst 4-5/10, but can get down to 0/10. Pt. states that pain is worst at night or after a lot of walking. Sitting for long periods of time and standing for long periods of time cause pain to increase. Pt. states that she fell 2 years ago and her R knee has not been the same since. About 2 weeks ago, she was getting into the shower and felt her R knee buckle, and she caught herself on the grab bar in the bathroom. Since then, she states that she has been fearful her R knee will buckle again. Pt. states that she moved here 4 years ago from [place], and enjoyed walking there and has not found any go to walking parks around here.    Pertinent History hx of blood  clots, CABG, and diabetic neuropathy.    Limitations Sitting    How long can you sit comfortably? ~30 mins    Patient Stated Goals wants to return to walking without pain    Currently in Pain? No/denies                 OBJECTIVE  Mental Status Patient is oriented to person, place and time.  Recent memory is intact.  Remote memory is intact.  Attention span and concentration are intact.  Expressive speech is intact.  Patient's fund of knowledge is within normal limits for educational level. Posture Patient demonstrates increased lumbar lordosis, mild thoracic kyphosis   Strength (out of 5) R/L 4-*/4Hip flexion 5/5Hip abduction (seated) 5/5Hip adduction (seated) 4-*/4- Hip internal rotation NT*/3+* Hip external  rotation NT*/5 Knee extension 4*/4+ Knee flexion 5/5Ankle dorsiflexion 5/5 Ankle plantarflexion (seated) *Indicates pain  AROM: Knee flex and ext [facility] bilat Hip IR WFL bilat Hip ER 10 deg bilat  Special Tests: 5XSTS: 18.3s FOTO: 40/55  Gait: Pt demonstrates decreased step length bilat, worse on R than left. Pt additionally demonstrates bilat lateral swaying with gait. Slightly antalgic on R LE.      Objective measurements completed on examination: See above findings.    HEP will be issued next tx. session      Plan - 11/26/19 1555    Clinical Impression Statement Pt is a 79 y.o. woman with c/o of R knee pain and bilat hip pain. Pt. has pain at worst 4-5/10, can get down to 0/10. Pt. scored 40/55 on FOTO. Pt has following strength in R LE: hip flex 4-/5 with pain, hip add and abd: 5/5, knee ext: NT due to pain with positioning, knee flex: 4/5 with pain, hip IR: 4-/5 with pain, hip ER: NT due to pain with positioning, and dorsiflex and plantarflex: 5/5. Pt has following strength in L LE: hip flex 4/5, hip add and abd: 5/5, knee ext: 5/5, knee flex: 4+/5 , hip IR: 4-/5, hip ER: 3+/5 with pain in L hip, and dorsiflex and plantarflex: 5/5. Pt. demonstrates dereased step length bilaterally, howerver it is shorter on the R than L. Pt. also demonstrates increased lateral sway with walking. Pt. demonstrates WFL ROM for knee flex and ext bilat. Pt. demonstrates decreased ROM with hip ER at 10 deg bilat. Pt completed 5xSTS in 18.3s. Pt. will benefit from skilled PT to improve strength and ROM of bilat LE, and decrease pain to decrease falls risk and improve pain free functional mobility.    Personal Factors and Comorbidities Comorbidity 2    Comorbidities hx of blood clots, diabetes    Stability/Clinical Decision Making Evolving/Moderate complexity    Clinical Decision Making Moderate    Rehab Potential Good    PT Frequency 2x / week    PT Duration 4 weeks    PT Treatment/Interventions  ADLs/Self Care Home Management;Neuromuscular re-education;Patient/family education;Cryotherapy;Balance training;Therapeutic exercise;Therapeutic activities;Functional mobility training;Stair training;Gait training;Passive range of motion;Manual techniques;Vestibular    PT Next Visit Plan hip mobilizations, gentle mat LE strengthening    Consulted and Agree with Plan of Care Patient           Patient will benefit from skilled therapeutic intervention in order to improve the following deficits and impairments:  Abnormal gait, Decreased activity tolerance, Decreased balance, Decreased coordination, Impaired flexibility, Postural dysfunction, Impaired sensation, Hypomobility, Decreased strength, Difficulty walking, Decreased range of motion, Decreased endurance, Pain  Visit Diagnosis: Chronic pain of right knee  Muscle weakness (generalized)  Decreased range of motion of both lower extremities  Imbalance     Problem List Patient Active Problem List   Diagnosis Date Noted  . Chest pain 12/24/2018  . Hypoglycemia 11/07/2017  . GIB (gastrointestinal bleeding) 11/07/2016  . COPD exacerbation ([place]) 05/12/2016  . Community acquired pneumonia 05/12/2016  . Leukocytosis 05/12/2016  . Generalized weakness 05/12/2016    Carlyle Basques, SPT 11/27/2019, 2:04 PM  [facility] [address] [place], [place], [address] Phone: [phone]   Fax:  [phone]  Name: Gina Chambers MRN: [id number] Date of Birth: [birth year]

## 2019-11-27 NOTE — Therapy (Signed)
[place] [facility] [address] [place], [place], [address] Phone: [phone]   Fax:  [phone]  Physical Therapy Evaluation  Patient Details  Name: Gina Chambers MRN: [id number] Date of Birth: [birth year] Referring Provider (PT): Rosalia Hammers, DO   Encounter Date: 11/26/2019   PT End of Session - 11/26/19 1550    Visit Number 1    Number of Visits 9    Date for PT Re-Evaluation 12/24/19    Authorization - Visit Number 1    Authorization - Number of Visits 10    PT Start Time 1400    PT Stop Time 1445    PT Time Calculation (min) 45 min    Activity Tolerance Patient tolerated treatment well    Behavior During Therapy [facility] for tasks assessed/performed           Past Medical History:  Diagnosis Date  . Asthma   . Asthmatic bronchitis , chronic ([place])   . CHF (congestive heart failure) ([place])   . Diabetes mellitus without complication ([place])   . 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 last one approx 2013  . Family history of adverse reaction to anesthesia    Mother - temporary paralysis of 1 side  . GERD (gastroesophageal reflux disease)   . Hypertension   . Neuropathy    feet  . PONV (postoperative nausea and vomiting)    Pt reports violent vomiting with ANY pain meds given with anesthesia.  . Sleep apnea    CPAP  . Thyroid disease   . Vertigo    daily    Past Surgical History:  Procedure Laterality Date  . CARDIAC SURGERY    . CATARACT EXTRACTION W/PHACO Right 03/11/2017   Procedure: CATARACT EXTRACTION PHACO AND INTRAOCULAR LENS PLACEMENT ([place])  RIGHT DIABETIC;  Surgeon: Eulogio Bear, MD;  Location: [place];  Service: Ophthalmology;  Laterality: Right;  Diabetic - insulin sleep apnea  . CATARACT EXTRACTION W/PHACO Left 04/02/2017   Procedure: CATARACT EXTRACTION PHACO AND INTRAOCULAR LENS PLACEMENT ([place]) LEFT DIABETES;  Surgeon: Eulogio Bear, MD;  Location: [place];   Service: Ophthalmology;  Laterality: Left;  Diabetic - insulin  . COLONOSCOPY WITH PROPOFOL N/A 11/09/2016   Procedure: COLONOSCOPY WITH PROPOFOL;  Surgeon: Jonathon Bellows, MD;  Location: [facility] END[facility]COPY;  Service: Gastroenterology;  Laterality: N/A;  . HERNIA REPAIR    . VERTICAL BANDED GASTROPLASTY      There were no vitals filed for this visit.    Subjective Assessment - 11/26/19 1531    Subjective Pt. is a 78 y.o. female with complaints of R knee and bilateral hip pain. Pt. reports that pain is at worst 4-5/10, but can get down to 0/10. Pt. states that pain is worst at night or after a lot of walking. Sitting for long periods of time and standing for long periods of time cause pain to increase. Pt. states that she fell 2 years ago and her R knee has not been the same since. About 2 weeks ago, she was getting into the shower and felt her R knee buckle, and she caught herself on the grab bar in the bathroom. Since then, she states that she has been fearful her R knee will buckle again. Pt. states that she moved here 4 years ago from [place], and enjoyed walking there and has not found any go to walking parks around here.    Pertinent History hx of blood  clots, CABG, and diabetic neuropathy.    Limitations Sitting    How long can you sit comfortably? ~30 mins    Patient Stated Goals wants to return to walking without pain    Currently in Pain? No/denies            OBJECTIVE  Mental Status Patient is oriented to person, place and time.  Recent memory is intact.  Remote memory is intact.  Attention span and concentration are intact.  Expressive speech is intact.  Patient's fund of knowledge is within normal limits for educational level. Posture Patient demonstrates increased lumbar lordosis, mild thoracic kyphosis   Strength (out of 5) R/L 4-*/4Hip flexion 5/5Hip abduction (seated) 5/5Hip adduction (seated) 4-*/4- Hip internal rotation NT*/3+* Hip external rotation NT*/5  Knee extension 4*/4+ Knee flexion 5/5Ankle dorsiflexion 5/5 Ankle plantarflexion (seated) *Indicates pain  AROM: Knee flex and ext [facility] bilat Hip IR WFL bilat Hip ER 10 deg bilat  Special Tests: 5XSTS: 18.3s FOTO: 40/55  Gait: Pt demonstrates decreased step length bilat, worse on R than left. Pt additionally demonstrates bilat lateral swaying with gait. Slightly antalgic on R LE.      Objective measurements completed on examination: See above findings.    HEP will be issued next tx. session     PT Long Term Goals - 11/26/19 1634      PT LONG TERM GOAL #1   Title Pt. will report being able to walk for .5 miles with no increase in pain above 2/10 to improve pain free functional mobility.    Baseline initial: pt unable to walk long distances wtihout increase in pain    Time 4    Period Weeks    Status New    Target Date 12/24/19      PT LONG TERM GOAL #2   Title Pt. will be report being able to sit for more than 30 mins with no increase in pain above 2/10 to improve ability to do work related tasks.    Baseline initial: ~30 mins pt feels onset of R knee pain    Time 4    Period Weeks    Status New    Target Date 12/24/19      PT LONG TERM GOAL #3   Title Pt. will improve 5XSTS to <12 sec to improve pain free functional mobility.    Baseline initial: 18.3 s    Time 4    Period Weeks    Status New    Target Date 12/24/19      PT LONG TERM GOAL #4   Title Pt. will score 55 on FOTO to improve pain free functional mobility.    Baseline initial: 40    Time 4    Period Weeks    Status New    Target Date 12/24/19              Plan - 11/26/19 1555    Clinical Impression Statement Pt is a 80 y.o. woman with c/o of R knee pain and bilat hip pain. Pt. has pain at worst 4-5/10, can get down to 0/10. Pt. scored 40/55 on FOTO. Pt has following strength in R LE: hip flex 4-/5 with pain, hip add and abd: 5/5, knee ext: NT due to pain with positioning, knee flex: 4/5  with pain, hip IR: 4-/5 with pain, hip ER: NT due to pain with positioning, and dorsiflex and plantarflex: 5/5. Pt has following strength in L LE: hip flex 4/5, hip add and  abd: 5/5, knee ext: 5/5, knee flex: 4+/5 , hip IR: 4-/5, hip ER: 3+/5 with pain in L hip, and dorsiflex and plantarflex: 5/5. Pt. demonstrates dereased step length bilaterally, howerver it is shorter on the R than L. Pt. also demonstrates increased lateral sway with walking. Pt. demonstrates WFL ROM for knee flex and ext bilat. Pt. demonstrates decreased ROM with hip ER at 10 deg bilat. Pt completed 5xSTS in 18.3s. Pt. will benefit from skilled PT to improve strength and ROM of bilat LE, and decrease pain to decrease falls risk and improve pain free functional mobility.    Personal Factors and Comorbidities Comorbidity 2    Comorbidities hx of blood clots, diabetes    Stability/Clinical Decision Making Evolving/Moderate complexity    Clinical Decision Making Moderate    Rehab Potential Good    PT Frequency 2x / week    PT Duration 4 weeks    PT Treatment/Interventions ADLs/Self Care Home Management;Neuromuscular re-education;Patient/family education;Cryotherapy;Balance training;Therapeutic exercise;Therapeutic activities;Functional mobility training;Stair training;Gait training;Passive range of motion;Manual techniques;Vestibular    PT Next Visit Plan hip mobilizations, gentle mat LE strengthening    Consulted and Agree with Plan of Care Patient           Patient will benefit from skilled therapeutic intervention in order to improve the following deficits and impairments:  Abnormal gait, Decreased activity tolerance, Decreased balance, Decreased coordination, Impaired flexibility, Postural dysfunction, Impaired sensation, Hypomobility, Decreased strength, Difficulty walking, Decreased range of motion, Decreased endurance, Pain  Visit Diagnosis: Chronic pain of right knee  Muscle weakness (generalized)  Decreased range of  motion of both lower extremities  Imbalance     Problem List Patient Active Problem List   Diagnosis Date Noted  . Chest pain 12/24/2018  . Hypoglycemia 11/07/2017  . GIB (gastrointestinal bleeding) 11/07/2016  . COPD exacerbation ([place]) 05/12/2016  . Community acquired pneumonia 05/12/2016  . Leukocytosis 05/12/2016  . Generalized weakness 05/12/2016   Pura Spice, PT, DPT # [phone] 11/27/2019, 2:05 PM  [place] [facility] [address] [place], [place], [address] Phone: [phone]   Fax:  [phone]  Name: Gina Chambers MRN: [id number] Date of Birth: Mar 08, 1941

## 2019-12-01 ENCOUNTER — Ambulatory Visit: Payer: Medicare Other | Admitting: Physical Therapy

## 2019-12-01 ENCOUNTER — Encounter: Payer: Self-pay | Admitting: Physical Therapy

## 2019-12-01 ENCOUNTER — Other Ambulatory Visit: Payer: Self-pay

## 2019-12-01 DIAGNOSIS — R2689 Other abnormalities of gait and mobility: Secondary | ICD-10-CM

## 2019-12-01 DIAGNOSIS — M6281 Muscle weakness (generalized): Secondary | ICD-10-CM

## 2019-12-01 DIAGNOSIS — M25661 Stiffness of right knee, not elsewhere classified: Secondary | ICD-10-CM

## 2019-12-01 DIAGNOSIS — M25561 Pain in right knee: Secondary | ICD-10-CM | POA: Diagnosis not present

## 2019-12-01 NOTE — Patient Instructions (Signed)
Access Code: [email or web address]: [email or web address]: 08/10/2021Prepared by: Legrand Como SherkExercises  Seated Hip Abduction with Resistance - 1 x daily - 5 x weekly - 1 sets - 20 reps  Seated March - 1 x daily - 5 x weekly - 1 sets - 20 reps

## 2019-12-01 NOTE — Therapy (Signed)
[place] [facility] [address] [place], [place], [address] Phone: [phone]   Fax:  [phone]  Physical Therapy Treatment  Patient Details  Name: Gina Chambers MRN: [id number] Date of Birth: [birth year] Referring Provider (PT): Rosalia Hammers, DO   Encounter Date: 12/01/2019   PT End of Session - 12/01/19 1609    Visit Number 2    Number of Visits 9    Date for PT Re-Evaluation 12/24/19    Authorization - Visit Number 2    Authorization - Number of Visits 10    PT Start Time 1600    PT Stop Time 1648    PT Time Calculation (min) 48 min    Activity Tolerance Patient tolerated treatment well    Behavior During Therapy [facility] for tasks assessed/performed           Past Medical History:  Diagnosis Date  . Asthma   . Asthmatic bronchitis , chronic ([place])   . CHF (congestive heart failure) ([place])   . Diabetes mellitus without complication ([place])   . 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 last one approx 2013  . Family history of adverse reaction to anesthesia    Mother - temporary paralysis of 1 side  . GERD (gastroesophageal reflux disease)   . Hypertension   . Neuropathy    feet  . PONV (postoperative nausea and vomiting)    Pt reports violent vomiting with ANY pain meds given with anesthesia.  . Sleep apnea    CPAP  . Thyroid disease   . Vertigo    daily    Past Surgical History:  Procedure Laterality Date  . CARDIAC SURGERY    . CATARACT EXTRACTION W/PHACO Right 03/11/2017   Procedure: CATARACT EXTRACTION PHACO AND INTRAOCULAR LENS PLACEMENT ([place])  RIGHT DIABETIC;  Surgeon: Eulogio Bear, MD;  Location: [place];  Service: Ophthalmology;  Laterality: Right;  Diabetic - insulin sleep apnea  . CATARACT EXTRACTION W/PHACO Left 04/02/2017   Procedure: CATARACT EXTRACTION PHACO AND INTRAOCULAR LENS PLACEMENT ([place]) LEFT DIABETES;  Surgeon: Eulogio Bear, MD;  Location: [place];   Service: Ophthalmology;  Laterality: Left;  Diabetic - insulin  . COLONOSCOPY WITH PROPOFOL N/A 11/09/2016   Procedure: COLONOSCOPY WITH PROPOFOL;  Surgeon: Jonathon Bellows, MD;  Location: [facility] ENDOSCOPY;  Service: Gastroenterology;  Laterality: N/A;  . HERNIA REPAIR    . VERTICAL BANDED GASTROPLASTY      There were no vitals filed for this visit.   Subjective Assessment - 12/01/19 1607    Subjective Pt. states that R knee is 3/10 pain and has a pain patch on R knee. Pt. states she was dancing this morning around the house.    Pertinent History hx of blood clots, CABG, and diabetic neuropathy.    Limitations Sitting    How long can you sit comfortably? ~30 mins    Patient Stated Goals wants to return to walking without pain    Currently in Pain? Yes    Pain Score 3     Pain Location Knee    Pain Orientation Right             There Ex:  Nustep L3 10 mins  Walking 3" step up with R knee, down with L knee: 10x. Cueing for slow speed to decrease momentum.   Side stepping in // bars: 5x  Walking marches // bars: 5x  Backwards walking // bars: 2x  Seated  hip flexion RTB: 20x  Seated hip abd RTB: 20x  Reviewed HEP      PT Long Term Goals - 11/26/19 1634      PT LONG TERM GOAL #1   Title Pt. will report being able to walk for .5 miles with no increase in pain above 2/10 to improve pain free functional mobility.    Baseline initial: pt unable to walk long distances wtihout increase in pain    Time 4    Period Weeks    Status New    Target Date 12/24/19      PT LONG TERM GOAL #2   Title Pt. will be report being able to sit for more than 30 mins with no increase in pain above 2/10 to improve ability to do work related tasks.    Baseline initial: ~30 mins pt feels onset of R knee pain    Time 4    Period Weeks    Status New    Target Date 12/24/19      PT LONG TERM GOAL #3   Title Pt. will improve 5XSTS to <12 sec to improve pain free functional mobility.     Baseline initial: 18.3 s    Time 4    Period Weeks    Status New    Target Date 12/24/19      PT LONG TERM GOAL #4   Title Pt. will score 55 on FOTO to improve pain free functional mobility.    Baseline initial: 40    Time 4    Period Weeks    Status New    Target Date 12/24/19                 Plan - 12/01/19 1654    Clinical Impression Statement Pt. demonstrates good ability to complete 3"step ups with good R knee control in concentric and eccentric actions. Pt. mentioned worries about popping in R knee, PT educated on popping without pain is not necessarily something to worry about. Pt. will continue to benefit from skilled PT to improve LE strength to decrease fall risk and improve pain free functional mobility.    Personal Factors and Comorbidities Comorbidity 2    Comorbidities hx of blood clots, diabetes    Stability/Clinical Decision Making Evolving/Moderate complexity    Clinical Decision Making Moderate    Rehab Potential Good    PT Frequency 2x / week    PT Duration 4 weeks    PT Treatment/Interventions ADLs/Self Care Home Management;Neuromuscular re-education;Patient/family education;Cryotherapy;Balance training;Therapeutic exercise;Therapeutic activities;Functional mobility training;Stair training;Gait training;Passive range of motion;Manual techniques;Vestibular    PT Next Visit Plan hip mobilizations, gentle mat LE strengthening    Consulted and Agree with Plan of Care Patient           Patient will benefit from skilled therapeutic intervention in order to improve the following deficits and impairments:  Abnormal gait, Decreased activity tolerance, Decreased balance, Decreased coordination, Impaired flexibility, Postural dysfunction, Impaired sensation, Hypomobility, Decreased strength, Difficulty walking, Decreased range of motion, Decreased endurance, Pain  Visit Diagnosis: Chronic pain of right knee  Muscle weakness (generalized)  Decreased range of  motion of both lower extremities  Imbalance     Problem List Patient Active Problem List   Diagnosis Date Noted  . Chest pain 12/24/2018  . Hypoglycemia 11/07/2017  . GIB (gastrointestinal bleeding) 11/07/2016  . COPD exacerbation ([place]) 05/12/2016  . Community acquired pneumonia 05/12/2016  . Leukocytosis 05/12/2016  . Generalized weakness 05/12/2016   Rebeca Alert  Clementeen Hoof, PT, DPT # [id number], SPT 12/02/2019, 6:27 PM  [place] [facility] [address] [place], [place], [address] Phone: [phone]   Fax:  [phone]  Name: BRIELE LAGASSE MRN: [id number] Date of Birth: 1940-05-12

## 2019-12-03 ENCOUNTER — Ambulatory Visit: Payer: Medicare Other | Admitting: Physical Therapy

## 2019-12-08 ENCOUNTER — Ambulatory Visit: Payer: Medicare Other | Admitting: Physical Therapy

## 2019-12-08 ENCOUNTER — Other Ambulatory Visit: Payer: Self-pay

## 2019-12-08 ENCOUNTER — Encounter: Payer: Self-pay | Admitting: Physical Therapy

## 2019-12-08 DIAGNOSIS — M25661 Stiffness of right knee, not elsewhere classified: Secondary | ICD-10-CM

## 2019-12-08 DIAGNOSIS — R2689 Other abnormalities of gait and mobility: Secondary | ICD-10-CM

## 2019-12-08 DIAGNOSIS — G8929 Other chronic pain: Secondary | ICD-10-CM

## 2019-12-08 DIAGNOSIS — M6281 Muscle weakness (generalized): Secondary | ICD-10-CM

## 2019-12-08 DIAGNOSIS — M25561 Pain in right knee: Secondary | ICD-10-CM | POA: Diagnosis not present

## 2019-12-08 NOTE — Therapy (Signed)
[place] [facility] [address] [place], [place], [address] Phone: [phone]   Fax:  [phone]  Physical Therapy Treatment  Patient Details  Name: Gina Chambers MRN: [id number] Date of Birth: February 12, 1941 Referring Provider (PT): Rosalia Hammers, DO   Encounter Date: 12/08/2019   PT End of Session - 12/08/19 5170    Visit Number 3    Number of Visits 9    Date for PT Re-Evaluation 12/24/19    Authorization - Visit Number 3    Authorization - Number of Visits 10    PT Start Time 0802    PT Stop Time 0900    PT Time Calculation (min) 58 min    Activity Tolerance Patient tolerated treatment well    Behavior During Therapy [facility] for tasks assessed/performed           Past Medical History:  Diagnosis Date  . Asthma   . Asthmatic bronchitis , chronic ([place])   . CHF (congestive heart failure) ([place])   . Diabetes mellitus without complication ([place])   . 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 last one approx 2013  . Family history of adverse reaction to anesthesia    Mother - temporary paralysis of 1 side  . GERD (gastroesophageal reflux disease)   . Hypertension   . Neuropathy    feet  . PONV (postoperative nausea and vomiting)    Pt reports violent vomiting with ANY pain meds given with anesthesia.  . Sleep apnea    CPAP  . Thyroid disease   . Vertigo    daily    Past Surgical History:  Procedure Laterality Date  . CARDIAC SURGERY    . CATARACT EXTRACTION W/PHACO Right 03/11/2017   Procedure: CATARACT EXTRACTION PHACO AND INTRAOCULAR LENS PLACEMENT ([place])  RIGHT DIABETIC;  Surgeon: Eulogio Bear, MD;  Location: [place];  Service: Ophthalmology;  Laterality: Right;  Diabetic - insulin sleep apnea  . CATARACT EXTRACTION W/PHACO Left 04/02/2017   Procedure: CATARACT EXTRACTION PHACO AND INTRAOCULAR LENS PLACEMENT ([place]) LEFT DIABETES;  Surgeon: Eulogio Bear, MD;  Location: [place];   Service: Ophthalmology;  Laterality: Left;  Diabetic - insulin  . COLONOSCOPY WITH PROPOFOL N/A 11/09/2016   Procedure: COLONOSCOPY WITH PROPOFOL;  Surgeon: Jonathon Bellows, MD;  Location: [facility] ENDOSCOPY;  Service: Gastroenterology;  Laterality: N/A;  . HERNIA REPAIR    . VERTICAL BANDED GASTROPLASTY      There were no vitals filed for this visit.   Subjective Assessment - 12/08/19 0819    Subjective Pt. states that the R leg is 1-2/10, L leg is 3/10. R leg "gave out" on her yesterday when giving dog a bath.    Pertinent History hx of blood clots, CABG, and diabetic neuropathy.    Limitations Sitting    How long can you sit comfortably? ~30 mins    Patient Stated Goals wants to return to walking without pain    Currently in Pain? Yes    Pain Score 3     Pain Location Knee    Pain Orientation Left              There Ex:  Nustep L1 10 mins  STS practice from various heights: 10x. Pt cued on proper technique to stand. Pt able to complete with good form and no increased pain in knees.  Neuro re-ed:  Standing perturbations with eyes open and eyes closed, then standing on airex:  8 mins  Walking with dual tasking: 10 mins. Pt cued for increased L stride length, and to focus attention on arches of feet where pt still has sensation.        PT Long Term Goals - 11/26/19 1634      PT LONG TERM GOAL #1   Title Pt. will report being able to walk for .5 miles with no increase in pain above 2/10 to improve pain free functional mobility.    Baseline initial: pt unable to walk long distances wtihout increase in pain    Time 4    Period Weeks    Status New    Target Date 12/24/19      PT LONG TERM GOAL #2   Title Pt. will be report being able to sit for more than 30 mins with no increase in pain above 2/10 to improve ability to do work related tasks.    Baseline initial: ~30 mins pt feels onset of R knee pain    Time 4    Period Weeks    Status New    Target Date 12/24/19       PT LONG TERM GOAL #3   Title Pt. will improve 5XSTS to <12 sec to improve pain free functional mobility.    Baseline initial: 18.3 s    Time 4    Period Weeks    Status New    Target Date 12/24/19      PT LONG TERM GOAL #4   Title Pt. will score 55 on FOTO to improve pain free functional mobility.    Baseline initial: 40    Time 4    Period Weeks    Status New    Target Date 12/24/19                 Plan - 12/08/19 8657    Clinical Impression Statement Pt. demonstrates good ability to complete STS from various heights to improve ability to stand from very low toilet at home. Pt. instructed to focus on arches of feet when walking due to that being remaining sensation on feet. Pt. better able to maintain balance during standing and walking when focusing on arches of feet. Pt. given small HEP to complete when sitting at desk to decrease swelling in legs. Pt. will continue to benefit from skilled PT to improve LE strength and balance to decrease falls risk.    Personal Factors and Comorbidities Comorbidity 2    Comorbidities hx of blood clots, diabetes    Stability/Clinical Decision Making Evolving/Moderate complexity    Clinical Decision Making Moderate    Rehab Potential Good    PT Frequency 2x / week    PT Duration 4 weeks    PT Treatment/Interventions ADLs/Self Care Home Management;Neuromuscular re-education;Patient/family education;Cryotherapy;Balance training;Therapeutic exercise;Therapeutic activities;Functional mobility training;Stair training;Gait training;Passive range of motion;Manual techniques;Vestibular    PT Next Visit Plan hip mobilizations, gentle mat LE strengthening    Consulted and Agree with Plan of Care Patient           Patient will benefit from skilled therapeutic intervention in order to improve the following deficits and impairments:  Abnormal gait, Decreased activity tolerance, Decreased balance, Decreased coordination, Impaired flexibility, Postural  dysfunction, Impaired sensation, Hypomobility, Decreased strength, Difficulty walking, Decreased range of motion, Decreased endurance, Pain  Visit Diagnosis: Chronic pain of right knee  Muscle weakness (generalized)  Decreased range of motion of both lower extremities  Imbalance     Problem List Patient Active Problem List  Diagnosis Date Noted  . Chest pain 12/24/2018  . Hypoglycemia 11/07/2017  . GIB (gastrointestinal bleeding) 11/07/2016  . COPD exacerbation ([place]) 05/12/2016  . Community acquired pneumonia 05/12/2016  . Leukocytosis 05/12/2016  . Generalized weakness 05/12/2016   Pura Spice, PT, DPT # [id number], SPT 12/08/2019, 1:14 PM  [place] [facility] [address] [place], [place], [address] Phone: [phone]   Fax:  [phone]  Name: Gina Chambers MRN: [id number] Date of Birth: Jan 28, 1941

## 2019-12-10 ENCOUNTER — Ambulatory Visit: Payer: Medicare Other | Admitting: Physical Therapy

## 2019-12-10 ENCOUNTER — Encounter: Payer: Self-pay | Admitting: Physical Therapy

## 2019-12-10 ENCOUNTER — Other Ambulatory Visit: Payer: Self-pay

## 2019-12-10 DIAGNOSIS — M25561 Pain in right knee: Secondary | ICD-10-CM

## 2019-12-10 DIAGNOSIS — M25661 Stiffness of right knee, not elsewhere classified: Secondary | ICD-10-CM

## 2019-12-10 DIAGNOSIS — M25662 Stiffness of left knee, not elsewhere classified: Secondary | ICD-10-CM

## 2019-12-10 DIAGNOSIS — M6281 Muscle weakness (generalized): Secondary | ICD-10-CM

## 2019-12-10 DIAGNOSIS — R2689 Other abnormalities of gait and mobility: Secondary | ICD-10-CM

## 2019-12-10 DIAGNOSIS — G8929 Other chronic pain: Secondary | ICD-10-CM

## 2019-12-10 NOTE — Therapy (Signed)
[facility] [address] [place], [place], [address] Phone: [phone]   Fax:  [phone]  Physical Therapy Treatment  Patient Details  Name: Gina Chambers MRN: [id number] Date of Birth: 1940/11/01 Referring Provider (PT): Rosalia Hammers, DO   Encounter Date: 12/10/2019   PT End of Session - 12/10/19 1411    Visit Number 4    Number of Visits 9    Date for PT Re-Evaluation 12/24/19    Authorization - Visit Number 4    Authorization - Number of Visits 10    PT Start Time 1400    PT Stop Time 1424    PT Time Calculation (min) 24 min    Activity Tolerance Patient tolerated treatment well    Behavior During Therapy [facility] for tasks assessed/performed           Past Medical History:  Diagnosis Date  . Asthma   . Asthmatic bronchitis , chronic ([place])   . CHF (congestive heart failure) ([place])   . Diabetes mellitus without complication ([place])   . 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 last one approx 2013  . Family history of adverse reaction to anesthesia    Mother - temporary paralysis of 1 side  . GERD (gastroesophageal reflux disease)   . Hypertension   . Neuropathy    feet  . PONV (postoperative nausea and vomiting)    Pt reports violent vomiting with ANY pain meds given with anesthesia.  . Sleep apnea    CPAP  . Thyroid disease   . Vertigo    daily    Past Surgical History:  Procedure Laterality Date  . CARDIAC SURGERY    . CATARACT EXTRACTION W/PHACO Right 03/11/2017   Procedure: CATARACT EXTRACTION PHACO AND INTRAOCULAR LENS PLACEMENT ([place])  RIGHT DIABETIC;  Surgeon: Eulogio Bear, MD;  Location: [place];  Service: Ophthalmology;  Laterality: Right;  Diabetic - insulin sleep apnea  . CATARACT EXTRACTION W/PHACO Left 04/02/2017   Procedure: CATARACT EXTRACTION PHACO AND INTRAOCULAR LENS PLACEMENT ([place]) LEFT DIABETES;  Surgeon: Eulogio Bear, MD;  Location: [place];   Service: Ophthalmology;  Laterality: Left;  Diabetic - insulin  . COLONOSCOPY WITH PROPOFOL N/A 11/09/2016   Procedure: COLONOSCOPY WITH PROPOFOL;  Surgeon: Jonathon Bellows, MD;  Location: [facility] ENDOSCOPY;  Service: Gastroenterology;  Laterality: N/A;  . HERNIA REPAIR    . VERTICAL BANDED GASTROPLASTY      There were no vitals filed for this visit.   Subjective Assessment - 12/10/19 1409    Subjective Pt. arrived to therapy with cold sweats and really weak, starting seeing "white" in vision, and knew that her blood sugar was very low. She ate 2 chocolate Atkin bars and drank a cup of water, felt better and insisted on continuing therapy. Pt. reports no longer feeling any sx.    Pertinent History hx of blood clots, CABG, and diabetic neuropathy.    Limitations Sitting    How long can you sit comfortably? ~30 mins    Patient Stated Goals wants to return to walking without pain    Currently in Pain? No/denies           Neuro re-ed:  Standing on airex: marches and eyes closed  Standing on bosu: standing with forward/backwards weightshifting in standing.   Obstacle course: 2x stepping over obstacles, weaving through cones, walking over blue mat. Progressed to side stepping over obstacles and cones and backwards weaving  through cones. Pt required CGA throughout. No LOB noticed. After 2nd time through, pt felt sx of low blood sugar again. Pt sat down and ate other half of candy bar that pt brought. Pt remained sitting until sx passed.          PT Long Term Goals - 11/26/19 1634      PT LONG TERM GOAL #1   Title Pt. will report being able to walk for .5 miles with no increase in pain above 2/10 to improve pain free functional mobility.    Baseline initial: pt unable to walk long distances wtihout increase in pain    Time 4    Period Weeks    Status New    Target Date 12/24/19      PT LONG TERM GOAL #2   Title Pt. will be report being able to sit for more than 30 mins with no increase  in pain above 2/10 to improve ability to do work related tasks.    Baseline initial: ~30 mins pt feels onset of R knee pain    Time 4    Period Weeks    Status New    Target Date 12/24/19      PT LONG TERM GOAL #3   Title Pt. will improve 5XSTS to <12 sec to improve pain free functional mobility.    Baseline initial: 18.3 s    Time 4    Period Weeks    Status New    Target Date 12/24/19      PT LONG TERM GOAL #4   Title Pt. will score 55 on FOTO to improve pain free functional mobility.    Baseline initial: 40    Time 4    Period Weeks    Status New    Target Date 12/24/19                 Plan - 12/11/19 1423    Clinical Impression Statement PT focused tx. on balance/ standing tasks at //-bars due to pt. entering PT with low blood sugar.  Pt. responded well during tx. session and continues to progress.  No change in ther.ex. at this time and pt. will continue with current HEP.    Personal Factors and Comorbidities Comorbidity 2    Comorbidities hx of blood clots, diabetes    Stability/Clinical Decision Making Evolving/Moderate complexity    Rehab Potential Good    PT Frequency 2x / week    PT Duration 4 weeks    PT Treatment/Interventions ADLs/Self Care Home Management;Neuromuscular re-education;Patient/family education;Cryotherapy;Balance training;Therapeutic exercise;Therapeutic activities;Functional mobility training;Stair training;Gait training;Passive range of motion;Manual techniques;Vestibular    PT Next Visit Plan hip mobilizations, gentle mat LE strengthening    Consulted and Agree with Plan of Care Patient           Patient will benefit from skilled therapeutic intervention in order to improve the following deficits and impairments:  Abnormal gait, Decreased activity tolerance, Decreased balance, Decreased coordination, Impaired flexibility, Postural dysfunction, Impaired sensation, Hypomobility, Decreased strength, Difficulty walking, Decreased range of  motion, Decreased endurance, Pain  Visit Diagnosis: Chronic pain of right knee  Muscle weakness (generalized)  Decreased range of motion of both lower extremities  Imbalance     Problem List Patient Active Problem List   Diagnosis Date Noted  . Chest pain 12/24/2018  . Hypoglycemia 11/07/2017  . GIB (gastrointestinal bleeding) 11/07/2016  . COPD exacerbation ([place]) 05/12/2016  . Community acquired pneumonia 05/12/2016  . Leukocytosis 05/12/2016  .  Generalized weakness 05/12/2016   Pura Spice, PT, DPT # [id number] [email or web address], SPT 12/11/2019, 2:24 PM  [place] [facility] [address] [place], [place], [address] Phone: [phone]   Fax:  [phone]  Name: Gina Chambers MRN: [id number] Date of Birth: 1941/02/23

## 2019-12-15 ENCOUNTER — Ambulatory Visit: Payer: Medicare Other | Admitting: Physical Therapy

## 2019-12-17 ENCOUNTER — Other Ambulatory Visit: Payer: Self-pay

## 2019-12-17 ENCOUNTER — Encounter: Payer: Self-pay | Admitting: Physical Therapy

## 2019-12-17 ENCOUNTER — Ambulatory Visit: Payer: Medicare Other | Admitting: Physical Therapy

## 2019-12-17 DIAGNOSIS — M25561 Pain in right knee: Secondary | ICD-10-CM

## 2019-12-17 DIAGNOSIS — M25661 Stiffness of right knee, not elsewhere classified: Secondary | ICD-10-CM

## 2019-12-17 DIAGNOSIS — R2689 Other abnormalities of gait and mobility: Secondary | ICD-10-CM

## 2019-12-17 DIAGNOSIS — M6281 Muscle weakness (generalized): Secondary | ICD-10-CM

## 2019-12-17 NOTE — Therapy (Signed)
[place] [facility] [address] [place], [place], [address] Phone: [phone]   Fax:  [phone]  Physical Therapy Treatment  Patient Details  Name: Gina Chambers MRN: [id number] Date of Birth: 06-29-1940 Referring Provider (PT): Rosalia Hammers, DO   Encounter Date: 12/17/2019   PT End of Session - 12/17/19 1347    Visit Number 5    Number of Visits 9    Date for PT Re-Evaluation 12/24/19    Authorization - Visit Number 5    Authorization - Number of Visits 10    PT Start Time 0350    PT Stop Time 1430    PT Time Calculation (min) 43 min    Activity Tolerance Patient tolerated treatment well    Behavior During Therapy [facility] for tasks assessed/performed           Past Medical History:  Diagnosis Date  . Asthma   . Asthmatic bronchitis , chronic ([place])   . CHF (congestive heart failure) ([place])   . Diabetes mellitus without complication ([place])   . 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 last one approx 2013  . Family history of adverse reaction to anesthesia    Mother - temporary paralysis of 1 side  . GERD (gastroesophageal reflux disease)   . Hypertension   . Neuropathy    feet  . PONV (postoperative nausea and vomiting)    Pt reports violent vomiting with ANY pain meds given with anesthesia.  . Sleep apnea    CPAP  . Thyroid disease   . Vertigo    daily    Past Surgical History:  Procedure Laterality Date  . CARDIAC SURGERY    . CATARACT EXTRACTION W/PHACO Right 03/11/2017   Procedure: CATARACT EXTRACTION PHACO AND INTRAOCULAR LENS PLACEMENT ([place])  RIGHT DIABETIC;  Surgeon: Eulogio Bear, MD;  Location: [place];  Service: Ophthalmology;  Laterality: Right;  Diabetic - insulin sleep apnea  . CATARACT EXTRACTION W/PHACO Left 04/02/2017   Procedure: CATARACT EXTRACTION PHACO AND INTRAOCULAR LENS PLACEMENT ([place]) LEFT DIABETES;  Surgeon: Eulogio Bear, MD;  Location: [place];   Service: Ophthalmology;  Laterality: Left;  Diabetic - insulin  . COLONOSCOPY WITH PROPOFOL N/A 11/09/2016   Procedure: COLONOSCOPY WITH PROPOFOL;  Surgeon: Jonathon Bellows, MD;  Location: [facility] ENDOSCOPY;  Service: Gastroenterology;  Laterality: N/A;  . HERNIA REPAIR    . VERTICAL BANDED GASTROPLASTY      There were no vitals filed for this visit.   Subjective Assessment - 12/17/19 1356    Subjective Pt. arrives to therapy feeling very tired after multiple nights of poor sleep. Pt. states her L shoulder has been feeling painful.    Pertinent History hx of blood clots, CABG, and diabetic neuropathy.    Limitations Sitting    How long can you sit comfortably? ~30 mins    Patient Stated Goals wants to return to walking without pain    Currently in Pain? Yes    Pain Score 6     Pain Location Shoulder            There Ex:  Nustep L1 LE only 10 mins: pt attempted using bilat arms however it aggravated R shoulder   Neuro re-ed:  Standing airex ball tosses: 5 mins. Ball tosses outside BOS in all directions  Walking cone taps: 3x back and forth length of floor ladder. Pt experienced more discomfort with R stance phase. Cueing to increase  R weight shift and upright posture during SLS.   Tandem walking: 2x length of floor ladder. Pt experienced few LOB but was able to self recover.    Pt taking several seated rest breaks due to feeling very fatigued prior to therapy today.       PT Long Term Goals - 11/26/19 1634      PT LONG TERM GOAL #1   Title Pt. will report being able to walk for .5 miles with no increase in pain above 2/10 to improve pain free functional mobility.    Baseline initial: pt unable to walk long distances wtihout increase in pain    Time 4    Period Weeks    Status New    Target Date 12/24/19      PT LONG TERM GOAL #2   Title Pt. will be report being able to sit for more than 30 mins with no increase in pain above 2/10 to improve ability to do work related  tasks.    Baseline initial: ~30 mins pt feels onset of R knee pain    Time 4    Period Weeks    Status New    Target Date 12/24/19      PT LONG TERM GOAL #3   Title Pt. will improve 5XSTS to <12 sec to improve pain free functional mobility.    Baseline initial: 18.3 s    Time 4    Period Weeks    Status New    Target Date 12/24/19      PT LONG TERM GOAL #4   Title Pt. will score 55 on FOTO to improve pain free functional mobility.    Baseline initial: 40    Time 4    Period Weeks    Status New    Target Date 12/24/19                 Plan - 12/17/19 1433    Clinical Impression Statement Pt. very fatigued prior to therapy, but pt able to complete various balance activities. Pt. responded well and experieced few LOB that she was able to self correct. Pt. experienced no increase in pain throughout session today in knees. Pt. will continue to benefit from skilled PT to improve strength and balance to decrease falls risk.    Personal Factors and Comorbidities Comorbidity 2    Comorbidities hx of blood clots, diabetes    Stability/Clinical Decision Making Evolving/Moderate complexity    Clinical Decision Making Moderate    Rehab Potential Good    PT Frequency 2x / week    PT Duration 4 weeks    PT Treatment/Interventions ADLs/Self Care Home Management;Neuromuscular re-education;Patient/family education;Cryotherapy;Balance training;Therapeutic exercise;Therapeutic activities;Functional mobility training;Stair training;Gait training;Passive range of motion;Manual techniques;Vestibular    PT Next Visit Plan hip mobilizations, gentle mat LE strengthening    Consulted and Agree with Plan of Care Patient           Patient will benefit from skilled therapeutic intervention in order to improve the following deficits and impairments:  Abnormal gait, Decreased activity tolerance, Decreased balance, Decreased coordination, Impaired flexibility, Postural dysfunction, Impaired sensation,  Hypomobility, Decreased strength, Difficulty walking, Decreased range of motion, Decreased endurance, Pain  Visit Diagnosis: Chronic pain of right knee  Muscle weakness (generalized)  Decreased range of motion of both lower extremities  Imbalance     Problem List Patient Active Problem List   Diagnosis Date Noted  . Chest pain 12/24/2018  . Hypoglycemia 11/07/2017  . GIB (  gastrointestinal bleeding) 11/07/2016  . COPD exacerbation ([place]) 05/12/2016  . Community acquired pneumonia 05/12/2016  . Leukocytosis 05/12/2016  . Generalized weakness 05/12/2016   Pura Spice, PT, DPT # [id number], SPT 12/17/2019, 6:20 PM  [place] [facility] [address] [place], [place], [address] Phone: [phone]   Fax:  [phone]  Name: Gina Chambers MRN: [id number] Date of Birth: 30-Nov-1940

## 2019-12-22 ENCOUNTER — Ambulatory Visit: Payer: Medicare Other | Admitting: Physical Therapy

## 2019-12-24 ENCOUNTER — Ambulatory Visit: Payer: Medicare Other | Admitting: Physical Therapy

## 2019-12-31 ENCOUNTER — Ambulatory Visit: Payer: Medicare Other | Attending: Sports Medicine

## 2019-12-31 ENCOUNTER — Encounter: Payer: Self-pay | Admitting: Physical Therapy

## 2019-12-31 ENCOUNTER — Other Ambulatory Visit: Payer: Self-pay

## 2019-12-31 DIAGNOSIS — M6281 Muscle weakness (generalized): Secondary | ICD-10-CM | POA: Insufficient documentation

## 2019-12-31 DIAGNOSIS — M25561 Pain in right knee: Secondary | ICD-10-CM | POA: Diagnosis present

## 2019-12-31 DIAGNOSIS — R2689 Other abnormalities of gait and mobility: Secondary | ICD-10-CM | POA: Insufficient documentation

## 2019-12-31 DIAGNOSIS — G8929 Other chronic pain: Secondary | ICD-10-CM | POA: Diagnosis present

## 2019-12-31 DIAGNOSIS — M25661 Stiffness of right knee, not elsewhere classified: Secondary | ICD-10-CM | POA: Insufficient documentation

## 2019-12-31 DIAGNOSIS — M25662 Stiffness of left knee, not elsewhere classified: Secondary | ICD-10-CM | POA: Diagnosis present

## 2019-12-31 NOTE — Therapy (Addendum)
[facility] [address] [place], [place], [address] Phone: [phone]   Fax:  [phone]  Physical Therapy Treatment  Patient Details  Name: Gina Chambers MRN: [id number] Date of Birth: 1940-10-14 Referring Provider (PT): Rosalia Hammers, DO   Encounter Date: 12/31/2019    12/31/19 1520  PT Visits / Re-Eval  Visit Number 6  Number of Visits 17  Date for PT Re-Evaluation 01/28/20  Authorization  Authorization - Visit Number 1  Authorization - Number of Visits 10  PT Time Calculation  PT Start Time 1518  PT Stop Time 1601  PT Time Calculation (min) 43 min  PT - End of Session  Activity Tolerance Patient tolerated treatment well  Behavior During Therapy [facility] for tasks assessed/performed   Past Medical History:  Diagnosis Date  . Asthma   . Asthmatic bronchitis , chronic ([place])   . CHF (congestive heart failure) ([place])   . Diabetes mellitus without complication ([place])   . 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 last one approx 2013  . Family history of adverse reaction to anesthesia    Mother - temporary paralysis of 1 side  . GERD (gastroesophageal reflux disease)   . Hypertension   . Neuropathy    feet  . PONV (postoperative nausea and vomiting)    Pt reports violent vomiting with ANY pain meds given with anesthesia.  . Sleep apnea    CPAP  . Thyroid disease   . Vertigo    daily    Past Surgical History:  Procedure Laterality Date  . CARDIAC SURGERY    . CATARACT EXTRACTION W/PHACO Right 03/11/2017   Procedure: CATARACT EXTRACTION PHACO AND INTRAOCULAR LENS PLACEMENT ([place])  RIGHT DIABETIC;  Surgeon: Eulogio Bear, MD;  Location: [place];  Service: Ophthalmology;  Laterality: Right;  Diabetic - insulin sleep apnea  . CATARACT EXTRACTION W/PHACO Left 04/02/2017   Procedure: CATARACT EXTRACTION PHACO AND INTRAOCULAR LENS PLACEMENT ([place]) LEFT DIABETES;  Surgeon: Eulogio Bear, MD;   Location: [place];  Service: Ophthalmology;  Laterality: Left;  Diabetic - insulin  . COLONOSCOPY WITH PROPOFOL N/A 11/09/2016   Procedure: COLONOSCOPY WITH PROPOFOL;  Surgeon: Jonathon Bellows, MD;  Location[facility];  Service: Gastroenterology;  Laterality: N/A;  . HERNIA REPAIR    . VERTICAL BANDED GASTROPLASTY      There were no vitals filed for this visit.   Subjective Assessment - 12/31/19 1521    Subjective Pt. states that she has been having trouble sleeping for most of the week. She states her hips are feeling very stiff. She has taken ibuprofen prior to therapy.    Pertinent History hx of blood clots, CABG, and diabetic neuropathy.    Limitations Sitting    How long can you sit comfortably? ~30 mins    Patient Stated Goals wants to return to walking without pain    Currently in Pain? Yes    Pain Score 3     Pain Location Leg    Pain Orientation Right;Left            There Ex:  Nustep L2 with heat pack under L hip 10 mins: pt reported goals assessed, see updated goals for details.   Goal reassessment: 5xSTS: 13.76s, FOTO: 43  Neuro re-ed:  // bars: step over step 6" hurdles. Cueing for increased glute contraction on stance leg and decreased speed. Pt progressed to 12" step in the middle step to  pattern over hurdles, then regressed to single 12" step over. Pt required same cueing to complete single 12" step over.   Manual Therapy:   Short arc hip distraction with use of mob belt: pt experienced no stretch and discomfort with belt, therefore discontinued   Long axis distraction: 2x each leg, 20 sec holds.  L hamstring stretch: 15 sec hold, experienced cramp in L glute            PT Long Term Goals - 12/31/19 1527      PT LONG TERM GOAL #1   Title Pt. will report being able to walk for .5 miles with no increase in pain above 2/10 to improve pain free functional mobility.    Baseline initial: pt unable to walk long distances wtihout increase  in pain; 9/9: pt able to walk for about 0.25 mile    Time 4    Period Weeks    Status Partially Met    Target Date 01/28/20      PT LONG TERM GOAL #2   Title Pt. will be report being able to sit for more than 30 mins with no increase in pain above 2/10 to improve ability to do work related tasks.    Baseline initial: ~30 mins pt feels onset of R knee pain. 9/9: pt can sit for more than 30 mins with no increase in pain on R knee    Time 4    Period Weeks    Status Achieved    Target Date 12/31/19      PT LONG TERM GOAL #3   Title Pt. will improve 5XSTS to <12 sec to improve pain free functional mobility.    Baseline initial: 18.3 s    Time 4    Period Weeks    Status New      PT LONG TERM GOAL #4   Title Pt. will score 55 on FOTO to improve pain free functional mobility.    Baseline initial: 40    Time 4    Period Weeks    Status New                12/31/19 1624  Plan  Clinical Impression Statement Pt. very fatigued prior to therapy due to poor sleep. PT assessed goals today, pt met some goals and has made progress towards other goals. See updated goals for details. Pt. responded well to manual therapy today with long axis distraction to bilat LEs. Pt. did well with stepping over hurdles today with verbal cueing to improve form and balance. Pt. will continue to benefit from skilled PT to improve strength and balance to decrease falls risk.  Personal Factors and Comorbidities Comorbidity 2  Comorbidities hx of blood clots, diabetes  Pt will benefit from skilled therapeutic intervention in order to improve on the following deficits Abnormal gait;Decreased activity tolerance;Decreased balance;Decreased coordination;Impaired flexibility;Postural dysfunction;Impaired sensation;Hypomobility;Decreased strength;Difficulty walking;Decreased range of motion;Decreased endurance;Pain  Stability/Clinical Decision Making Evolving/Moderate complexity  Clinical Decision Making Moderate   Rehab Potential Good  PT Frequency 2x / week  PT Duration 4 weeks  PT Treatment/Interventions ADLs/Self Care Home Management;Neuromuscular re-education;Patient/family education;Cryotherapy;Balance training;Therapeutic exercise;Therapeutic activities;Functional mobility training;Stair training;Gait training;Passive range of motion;Manual techniques;Vestibular  PT Next Visit Plan hip mobilizations, gentle mat LE strengthening  Consulted and Agree with Plan of Care Patient   Visit Diagnosis: No diagnosis found.     Problem List Patient Active Problem List   Diagnosis Date Noted  . Chest pain 12/24/2018  . Hypoglycemia 11/07/2017  .  GIB (gastrointestinal bleeding) 11/07/2016  . COPD exacerbation ([place]) 05/12/2016  . Community acquired pneumonia 05/12/2016  . Leukocytosis 05/12/2016  . Generalized weakness 05/12/2016    Carlyle Basques, SPT 12/31/2019, 3:29 PM  [place] [facility] [address] [place], [place], [address] Phone: [phone]   Fax:  [phone]  Name: ALEEYA VEITCH MRN: [id number] Date of Birth: 18-Sep-1940

## 2020-01-05 ENCOUNTER — Ambulatory Visit: Payer: Medicare Other | Admitting: Physical Therapy

## 2020-01-07 ENCOUNTER — Encounter: Payer: Self-pay | Admitting: Physical Therapy

## 2020-01-07 ENCOUNTER — Ambulatory Visit: Payer: Medicare Other

## 2020-01-07 ENCOUNTER — Other Ambulatory Visit: Payer: Self-pay

## 2020-01-07 DIAGNOSIS — M25561 Pain in right knee: Secondary | ICD-10-CM | POA: Diagnosis not present

## 2020-01-07 DIAGNOSIS — M25661 Stiffness of right knee, not elsewhere classified: Secondary | ICD-10-CM

## 2020-01-07 DIAGNOSIS — R2689 Other abnormalities of gait and mobility: Secondary | ICD-10-CM

## 2020-01-07 DIAGNOSIS — M6281 Muscle weakness (generalized): Secondary | ICD-10-CM

## 2020-01-07 DIAGNOSIS — G8929 Other chronic pain: Secondary | ICD-10-CM

## 2020-01-07 NOTE — Patient Instructions (Signed)
Access Code: [email or web address] URL: [email or web address] Date: 01/07/2020 Prepared by: Lieutenant Diego Exercises Supine Bridge with Resistance Band - 1 x daily - 7 x weekly - 2 sets - 10 reps Hooklying Clamshell with Resistance - 1 x daily - 7 x weekly - 2 sets - 10 reps Seated Figure 4 Piriformis Stretch - 1 x daily - 7 x weekly - 1 sets - 3 reps - 30 hold

## 2020-01-07 NOTE — Therapy (Signed)
[place] [facility] [address] [place], [place], [address] Phone: [phone]   Fax:  [phone]  Physical Therapy Treatment  Patient Details  Name: Gina Chambers MRN: [id number] Date of Birth: 05-30-1940 Referring Provider (PT): Rosalia Hammers, DO   Encounter Date: 01/07/2020   PT End of Session - 01/07/20 1351    Visit Number 7    Number of Visits 17    Date for PT Re-Evaluation 01/28/20    Authorization - Visit Number 2    Authorization - Number of Visits 10    PT Start Time 0321    PT Stop Time 1430    PT Time Calculation (min) 43 min    Activity Tolerance Patient limited by fatigue    Behavior During Therapy [facility] for tasks assessed/performed           Past Medical History:  Diagnosis Date  . Asthma   . Asthmatic bronchitis , chronic ([place])   . CHF (congestive heart failure) ([place])   . Diabetes mellitus without complication ([place])   . 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 last one approx 2013  . Family history of adverse reaction to anesthesia    Mother - temporary paralysis of 1 side  . GERD (gastroesophageal reflux disease)   . Hypertension   . Neuropathy    feet  . PONV (postoperative nausea and vomiting)    Pt reports violent vomiting with ANY pain meds given with anesthesia.  . Sleep apnea    CPAP  . Thyroid disease   . Vertigo    daily    Past Surgical History:  Procedure Laterality Date  . CARDIAC SURGERY    . CATARACT EXTRACTION W/PHACO Right 03/11/2017   Procedure: CATARACT EXTRACTION PHACO AND INTRAOCULAR LENS PLACEMENT ([place])  RIGHT DIABETIC;  Surgeon: Eulogio Bear, MD;  Location: [place];  Service: Ophthalmology;  Laterality: Right;  Diabetic - insulin sleep apnea  . CATARACT EXTRACTION W/PHACO Left 04/02/2017   Procedure: CATARACT EXTRACTION PHACO AND INTRAOCULAR LENS PLACEMENT ([place]) LEFT DIABETES;  Surgeon: Eulogio Bear, MD;  Location: [place];   Service: Ophthalmology;  Laterality: Left;  Diabetic - insulin  . COLONOSCOPY WITH PROPOFOL N/A 11/09/2016   Procedure: COLONOSCOPY WITH PROPOFOL;  Surgeon: Jonathon Bellows, MD;  Location: [facility] ENDOSCOPY;  Service: Gastroenterology;  Laterality: N/A;  . HERNIA REPAIR    . VERTICAL BANDED GASTROPLASTY      There were no vitals filed for this visit.   Subjective Assessment - 01/07/20 1347    Subjective Pt reports being tired today and remains having difficulty with sleeping. Wanting to see a doctor for her restless leg syndrome and difficulty sleeping. B hip pain at rest is 2/10 pain NPS. Increased pain when squatting.    Pertinent History hx of blood clots, CABG, and diabetic neuropathy.    Limitations Sitting    How long can you sit comfortably? ~30 mins    Patient Stated Goals wants to return to walking without pain    Currently in Pain? Yes    Pain Score 2     Pain Location Hip    Pain Orientation Right;Left          There.ex:   Nu-Step L2 for 10 min. BLE's only. Discussion of future appointments and POC with sleeping issues. Use of SPM at or above 65 SPM for cardiorespiratory endurance.   Education on how to perform B Figure 4 stretch:  1x30 sec. Pain with stretch so discontinued.  Supine bridges: 1x10. Tactile and verbal cues for improved eccentric control of gluts when lowering LE's.   Supine post pelvic tilts: 1x10  Post pelvic tilt with hook-lying B hip ER: Yellow TB,  2x10   Seated figure 4 stretch: pt better able to feel stretch, 1x30 sec each side.   Manual Therapy:   Long arc B hip distraction with pt in supine: 2x30 sec     Pt given new HEP to complete.                        PT Education - 01/07/20 1507    Education Details see HEP    Person(s) Educated Patient    Methods Demonstration;Explanation;Handout    Comprehension Verbalized understanding;Returned demonstration               PT Long Term Goals - 12/31/19 1527      PT LONG  TERM GOAL #1   Title Pt. will report being able to walk for .5 miles with no increase in pain above 2/10 to improve pain free functional mobility.    Baseline initial: pt unable to walk long distances wtihout increase in pain; 9/9: pt able to walk for about 0.25 mile    Time 4    Period Weeks    Status Partially Met    Target Date 01/28/20      PT LONG TERM GOAL #2   Title Pt. will be report being able to sit for more than 30 mins with no increase in pain above 2/10 to improve ability to do work related tasks.    Baseline initial: ~30 mins pt feels onset of R knee pain. 9/9: pt can sit for more than 30 mins with no increase in pain on R knee    Time 4    Period Weeks    Status Achieved    Target Date 12/31/19      PT LONG TERM GOAL #3   Title Pt. will improve 5XSTS to <12 sec to improve pain free functional mobility.    Baseline initial: 18.3 s; 9/9: 13.76 s    Time 4    Period Weeks    Status Partially Met    Target Date 01/28/20      PT LONG TERM GOAL #4   Title Pt. will score 55 on FOTO to improve pain free functional mobility.    Baseline initial: 40; 9/9:43    Time 4    Period Weeks    Status Partially Met    Target Date 01/28/20                 Plan - 01/07/20 1453    Clinical Impression Statement Pt. arrived to clinic appearing unsteady and very tired due to not having slept well all week. Pt completed several supine LE strengthening activities today. Pt. continues to respond well to long axis distraction on both LEs with reported decrease in pain after. PT discussed importance of sleep and encouraged pt to see a sleep specialist, pt was not interested in seeing a specialist. Pt. will continue to benefit from skilled PT to improve strength and balance to decrease falls risk.    Personal Factors and Comorbidities Comorbidity 2    Comorbidities hx of blood clots, diabetes    Stability/Clinical Decision Making Evolving/Moderate complexity    Clinical Decision Making  Moderate    Rehab Potential Good    PT  Frequency 2x / week    PT Duration 4 weeks    PT Treatment/Interventions ADLs/Self Care Home Management;Neuromuscular re-education;Patient/family education;Cryotherapy;Balance training;Therapeutic exercise;Therapeutic activities;Functional mobility training;Stair training;Gait training;Passive range of motion;Manual techniques;Vestibular    PT Next Visit Plan hip mobilizations, gentle mat LE strengthening    Consulted and Agree with Plan of Care Patient           Patient will benefit from skilled therapeutic intervention in order to improve the following deficits and impairments:  Abnormal gait, Decreased activity tolerance, Decreased balance, Decreased coordination, Impaired flexibility, Postural dysfunction, Impaired sensation, Hypomobility, Decreased strength, Difficulty walking, Decreased range of motion, Decreased endurance, Pain  Visit Diagnosis: Chronic pain of right knee  Muscle weakness (generalized)  Decreased range of motion of both lower extremities  Imbalance     Problem List Patient Active Problem List   Diagnosis Date Noted  . Chest pain 12/24/2018  . Hypoglycemia 11/07/2017  . GIB (gastrointestinal bleeding) 11/07/2016  . COPD exacerbation ([place]) 05/12/2016  . Community acquired pneumonia 05/12/2016  . Leukocytosis 05/12/2016  . Generalized weakness 05/12/2016    Carlyle Basques, SPT 01/07/2020, 3:08 PM  [facility] [address] [place], [place], [address] Phone: [phone]   Fax:  [phone]  Name: Gina Chambers MRN: [id number] Date of Birth: December 20, 1940

## 2020-01-12 ENCOUNTER — Other Ambulatory Visit: Payer: Self-pay

## 2020-01-12 ENCOUNTER — Ambulatory Visit: Payer: Medicare Other | Admitting: Physical Therapy

## 2020-01-12 ENCOUNTER — Encounter: Payer: Self-pay | Admitting: Physical Therapy

## 2020-01-12 DIAGNOSIS — M25561 Pain in right knee: Secondary | ICD-10-CM | POA: Diagnosis not present

## 2020-01-12 DIAGNOSIS — M6281 Muscle weakness (generalized): Secondary | ICD-10-CM

## 2020-01-12 DIAGNOSIS — M25661 Stiffness of right knee, not elsewhere classified: Secondary | ICD-10-CM

## 2020-01-12 DIAGNOSIS — G8929 Other chronic pain: Secondary | ICD-10-CM

## 2020-01-12 DIAGNOSIS — R2689 Other abnormalities of gait and mobility: Secondary | ICD-10-CM

## 2020-01-12 NOTE — Therapy (Signed)
[facility] [address] [place], [place], [address] Phone: [phone]   Fax:  [phone]  Physical Therapy Treatment  Patient Details  Name: Gina Chambers MRN: [id number] Date of Birth: Dec 06, 1940 Referring Provider (PT): Rosalia Hammers, DO   Encounter Date: 01/12/2020   PT End of Session - 01/12/20 1335    Visit Number 8    Number of Visits 17    Date for PT Re-Evaluation 01/28/20    Authorization - Visit Number 3    Authorization - Number of Visits 10    PT Start Time 0962    PT Stop Time 1425    PT Time Calculation (min) 49 min    Activity Tolerance Patient limited by fatigue    Behavior During Therapy [facility] for tasks assessed/performed           Past Medical History:  Diagnosis Date   Asthma    Asthmatic bronchitis , chronic (H[facility])    CHF (congestive heart failure) ([place])    Diabetes mellitus without complication ([place])    DVT (deep venous thrombosis) ([facility])    x3 - both legs. last one approx 2013   Family history of adverse reaction to anesthesia    Mother - temporary paralysis of 1 side   GERD (gastroesophageal reflux disease)    Hypertension    Neuropathy    feet   PONV (postoperative nausea and vomiting)    Pt reports violent vomiting with ANY pain meds given with anesthesia.   Sleep apnea    CPAP   Thyroid disease    Vertigo    daily    Past Surgical History:  Procedure Laterality Date   CARDIAC SURGERY     CATARACT EXTRACTION W/PHACO Right 03/11/2017   Procedure: CATARACT EXTRACTION PHACO AND INTRAOCULAR LENS PLACEMENT ([place])  RIGHT DIABETIC;  Surgeon: Eulogio Bear, MD;  Location: [place];  Service: Ophthalmology;  Laterality: Right;  Diabetic - insulin sleep apnea   CATARACT EXTRACTION W/PHACO Left 04/02/2017   Procedure: CATARACT EXTRACTION PHACO AND INTRAOCULAR LENS PLACEMENT ([place]) LEFT DIABETES;  Surgeon: Eulogio Bear, MD;  Location: [place];   Service: Ophthalmology;  Laterality: Left;  Diabetic - insulin   COLONOSCOPY WITH PROPOFOL N/A 11/09/2016   Procedure: COLONOSCOPY WITH PROPOFOL;  Surgeon: Jonathon Bellows, MD;  [facility];  Service: Gastroenterology;  Laterality: N/A;   HERNIA REPAIR     VERTICAL BANDED GASTROPLASTY      There were no vitals filed for this visit.   Subjective Assessment - 01/12/20 1335    Subjective Pt. reports that her son in law had a knee replacement recently, therefore she's been helping around the house more. Pt. states that her hips are still 2/10 pain NPS. Pt. states that her R leg "gave way" this morning.    Pertinent History hx of blood clots, CABG, and diabetic neuropathy.    Limitations Sitting    How long can you sit comfortably? ~30 mins    Patient Stated Goals wants to return to walking without pain    Currently in Pain? Yes    Pain Score 2     Pain Location Hip    Pain Orientation Left;Right           There Ex:  Nustep: L2 10 mins. Arms on L10.   Supine Bridges: 10x. Cueing for speed of activity.   Single leg step ups on stairs: 2x10 each leg, verbal and tactile cueing for knee  positioning to decrease knee pain.   4 way hip against YTB: 10x each direction each leg. Cueing for maintaining straight leg and keeping pure motion in each direction. Additional cueing to decrease leaning during activities.   Manual:   Long axis LE distraction in supine: pt felt pain in hip on L leg, therefore discontinued. Pt felt relief when completed on R leg.       PT Long Term Goals - 12/31/19 1527      PT LONG TERM GOAL #1   Title Pt. will report being able to walk for .5 miles with no increase in pain above 2/10 to improve pain free functional mobility.    Baseline initial: pt unable to walk long distances wtihout increase in pain; 9/9: pt able to walk for about 0.25 mile    Time 4    Period Weeks    Status Partially Met    Target Date 01/28/20      PT LONG TERM GOAL #2    Title Pt. will be report being able to sit for more than 30 mins with no increase in pain above 2/10 to improve ability to do work related tasks.    Baseline initial: ~30 mins pt feels onset of R knee pain. 9/9: pt can sit for more than 30 mins with no increase in pain on R knee    Time 4    Period Weeks    Status Achieved    Target Date 12/31/19      PT LONG TERM GOAL #3   Title Pt. will improve 5XSTS to <12 sec to improve pain free functional mobility.    Baseline initial: 18.3 s; 9/9: 13.76 s    Time 4    Period Weeks    Status Partially Met    Target Date 01/28/20      PT LONG TERM GOAL #4   Title Pt. will score 55 on FOTO to improve pain free functional mobility.    Baseline initial: 40; 9/9:43    Time 4    Period Weeks    Status Partially Met    Target Date 01/28/20                 Plan - 01/12/20 1420    Clinical Impression Statement Pt. arrived to clinic appearing more energetic than prior sessions. Pt. completed standing hip and quad strengthening exercises, able to maintain standing with fewer seated rest breaks. Pt. responded well to R LE distraction in supine. Pt. will continue to benefit from skilled PT to improve strength and balance to decrease falls risk    Personal Factors and Comorbidities Comorbidity 2    Comorbidities hx of blood clots, diabetes    Stability/Clinical Decision Making Evolving/Moderate complexity    Clinical Decision Making Moderate    Rehab Potential Good    PT Frequency 2x / week    PT Duration 4 weeks    PT Treatment/Interventions ADLs/Self Care Home Management;Neuromuscular re-education;Patient/family education;Cryotherapy;Balance training;Therapeutic exercise;Therapeutic activities;Functional mobility training;Stair training;Gait training;Passive range of motion;Manual techniques;Vestibular    PT Next Visit Plan hip mobilizations, gentle mat LE strengthening    Consulted and Agree with Plan of Care Patient           Patient will  benefit from skilled therapeutic intervention in order to improve the following deficits and impairments:  Abnormal gait, Decreased activity tolerance, Decreased balance, Decreased coordination, Impaired flexibility, Postural dysfunction, Impaired sensation, Hypomobility, Decreased strength, Difficulty walking, Decreased range of motion, Decreased endurance, Pain  Visit Diagnosis: Chronic pain of right knee  Muscle weakness (generalized)  Decreased range of motion of both lower extremities  Imbalance     Problem List Patient Active Problem List   Diagnosis Date Noted   Chest pain 12/24/2018   Hypoglycemia 11/07/2017   GIB (gastrointestinal bleeding) 11/07/2016   COPD exacerbation ([place]) 05/12/2016   Community acquired pneumonia 05/12/2016   Leukocytosis 05/12/2016   Generalized weakness 05/12/2016   Pura Spice, PT, DPT # [id number], SPT 01/13/2020, 8:00 AM  [facility] [address] [place], [place], [address] Phone: [phone]   Fax:  [phone]  Name: Gina Chambers MRN: [id number] Date of Birth: March 30, 1941

## 2020-01-14 ENCOUNTER — Ambulatory Visit: Payer: Medicare Other | Admitting: Physical Therapy

## 2020-01-14 ENCOUNTER — Other Ambulatory Visit: Payer: Self-pay

## 2020-01-14 ENCOUNTER — Encounter: Payer: Self-pay | Admitting: Physical Therapy

## 2020-01-14 DIAGNOSIS — G8929 Other chronic pain: Secondary | ICD-10-CM

## 2020-01-14 DIAGNOSIS — M6281 Muscle weakness (generalized): Secondary | ICD-10-CM

## 2020-01-14 DIAGNOSIS — M25561 Pain in right knee: Secondary | ICD-10-CM | POA: Diagnosis not present

## 2020-01-14 DIAGNOSIS — M25661 Stiffness of right knee, not elsewhere classified: Secondary | ICD-10-CM

## 2020-01-14 DIAGNOSIS — R2689 Other abnormalities of gait and mobility: Secondary | ICD-10-CM

## 2020-01-14 NOTE — Therapy (Signed)
[place] [facility] [address]. [place], [place], [address] Phone: [phone]   Fax:  [phone]  Physical Therapy Treatment  Patient Details  Name: Gina Chambers MRN: [id number] Date of Birth: Feb 14, 1941 Referring Provider (PT): Rosalia Hammers, DO   Encounter Date: 01/14/2020   PT End of Session - 01/14/20 1345    Visit Number 9    Number of Visits 17    Date for PT Re-Evaluation 01/28/20    Authorization - Visit Number 4    Authorization - Number of Visits 10    PT Start Time 5537    PT Stop Time 1430    PT Time Calculation (min) 48 min    Equipment Utilized During Treatment Gait belt    Activity Tolerance Patient limited by fatigue    Behavior During Therapy [facility] for tasks assessed/performed           Past Medical History:  Diagnosis Date  . Asthma   . Asthmatic bronchitis , chronic ([place])   . CHF (congestive heart failure) ([place])   . Diabetes mellitus without complication ([place])   . 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 last one approx 2013  . Family history of adverse reaction to anesthesia    Mother - temporary paralysis of 1 side  . GERD (gastroesophageal reflux disease)   . Hypertension   . Neuropathy    feet  . PONV (postoperative nausea and vomiting)    Pt reports violent vomiting with ANY pain meds given with anesthesia.  . Sleep apnea    CPAP  . Thyroid disease   . Vertigo    daily    Past Surgical History:  Procedure Laterality Date  . CARDIAC SURGERY    . CATARACT EXTRACTION W/PHACO Right 03/11/2017   Procedure: CATARACT EXTRACTION PHACO AND INTRAOCULAR LENS PLACEMENT ([place])  RIGHT DIABETIC;  Surgeon: Eulogio Bear, MD;  Location: [place];  Service: Ophthalmology;  Laterality: Right;  Diabetic - insulin sleep apnea  . CATARACT EXTRACTION W/PHACO Left 04/02/2017   Procedure: CATARACT EXTRACTION PHACO AND INTRAOCULAR LENS PLACEMENT ([place]) LEFT DIABETES;  Surgeon: Eulogio Bear, MD;  Location: [place];  Service: Ophthalmology;  Laterality: Left;  Diabetic - insulin  . COLONOSCOPY WITH PROPOFOL N/A 11/09/2016   Procedure: COLONOSCOPY WITH PROPOFOL;  Surgeon: Jonathon Bellows, MD;  Location[facility] ENDOSCOPY;  Service: Gastroenterology;  Laterality: N/A;  . HERNIA REPAIR    . VERTICAL BANDED GASTROPLASTY      There were no vitals filed for this visit.   Subjective Assessment - 01/14/20 1343    Subjective Pt. reports that she has was able to get some sleep this morning and feels much more energetic. Pt reports that R hip is 2/10 pain, but L hip is feeling good.    Pertinent History hx of blood clots, CABG, and diabetic neuropathy.    Limitations Sitting    How long can you sit comfortably? ~30 mins    Patient Stated Goals wants to return to walking without pain    Currently in Pain? Yes    Pain Score 2     Pain Location Hip    Pain Orientation Right            There Ex:  Nustep L2 LE only 10 mins (unbilled)  Lunges to stepstool plus airex: pt cued for controlling knee down to footstool+airex without use of UE support. Pt completed 8x each leg. Pt  cued for proper knee positioning and to bring hips posteriorly.   Mini lunges in // bars: pt able to complete mini lunges with cueing for bringing hips posteriorly and for proper knee positioning. 10x each leg.   Neuro Re-ed:  Outside walking: ascending/descending curbs, walking on grassy terrain, up/down hills. Pt provided SBA-CGA throughout. Pt experienced 1 LOB when stepping back onto sidewalk, able to self correct. Pt demonstrated cautious gait with decreased step length bilat. Pt required cueing to maintain looking up and forward.   [place] reaches outside BOS standing on airex pad: pt cued to utilizing hip strategy to reach and recover from reaching.   Floor transfer technique: Pt able to descend to floor with assist of blue mat table, able to come to standing with min assistx1         PT Long Term Goals - 12/31/19 1527      PT LONG TERM GOAL #1   Title Pt. will report being able to walk for .5 miles with no increase in pain above 2/10 to improve pain free functional mobility.    Baseline initial: pt unable to walk long distances wtihout increase in pain; 9/9: pt able to walk for about 0.25 mile    Time 4    Period Weeks    Status Partially Met    Target Date 01/28/20      PT LONG TERM GOAL #2   Title Pt. will be report being able to sit for more than 30 mins with no increase in pain above 2/10 to improve ability to do work related tasks.    Baseline initial: ~30 mins pt feels onset of R knee pain. 9/9: pt can sit for more than 30 mins with no increase in pain on R knee    Time 4    Period Weeks    Status Achieved    Target Date 12/31/19      PT LONG TERM GOAL #3   Title Pt. will improve 5XSTS to <12 sec to improve pain free functional mobility.    Baseline initial: 18.3 s; 9/9: 13.76 s    Time 4    Period Weeks    Status Partially Met    Target Date 01/28/20      PT LONG TERM GOAL #4   Title Pt. will score 55 on FOTO to improve pain free functional mobility.    Baseline initial: 40; 9/9:43    Time 4    Period Weeks    Status Partially Met    Target Date 01/28/20                 Plan - 01/14/20 1533    Clinical Impression Statement Pt. arrived to clinic with more energy. Pt. completed dynamic balace activities and floor transfer practice. Pt. able to complete floor transfers with use of table and min assist with no table to come to standing. Pt. experienced most difficulty with floor transfer with controlling knees to ground and being able to push to stand. Pt. completed LE strengthening to improve ability to control descend and improve coming to standing. Pt. will continue to benefit from skilled PT to improve balance and strength to improve independence and decrease falls risk.    Personal Factors and Comorbidities Comorbidity 2    Comorbidities hx  of blood clots, diabetes    Stability/Clinical Decision Making Evolving/Moderate complexity    Clinical Decision Making Moderate    Rehab Potential Good    PT Frequency 2x / week  PT Duration 4 weeks    PT Treatment/Interventions ADLs/Self Care Home Management;Neuromuscular re-education;Patient/family education;Cryotherapy;Balance training;Therapeutic exercise;Therapeutic activities;Functional mobility training;Stair training;Gait training;Passive range of motion;Manual techniques;Vestibular    PT Next Visit Plan hip mobilizations, gentle mat LE strengthening    Consulted and Agree with Plan of Care Patient           Patient will benefit from skilled therapeutic intervention in order to improve the following deficits and impairments:  Abnormal gait, Decreased activity tolerance, Decreased balance, Decreased coordination, Impaired flexibility, Postural dysfunction, Impaired sensation, Hypomobility, Decreased strength, Difficulty walking, Decreased range of motion, Decreased endurance, Pain  Visit Diagnosis: Chronic pain of right knee  Muscle weakness (generalized)  Decreased range of motion of both lower extremities  Imbalance     Problem List Patient Active Problem List   Diagnosis Date Noted  . Chest pain 12/24/2018  . Hypoglycemia 11/07/2017  . GIB (gastrointestinal bleeding) 11/07/2016  . COPD exacerbation ([place]) 05/12/2016  . Community acquired pneumonia 05/12/2016  . Leukocytosis 05/12/2016  . Generalized weakness 05/12/2016   Pura Spice, PT, DPT # [id number], SPT 01/15/2020, 9:13 AM  [place] [facility] [address] [place], [place], [address] Phone: [phone]   Fax:  [phone]  Name: Gina Chambers MRN: [id number] Date of Birth: 03/19/1941

## 2020-01-19 ENCOUNTER — Ambulatory Visit: Payer: Medicare Other | Admitting: Physical Therapy

## 2020-01-21 ENCOUNTER — Ambulatory Visit: Payer: Medicare Other

## 2020-01-21 ENCOUNTER — Other Ambulatory Visit: Payer: Self-pay

## 2020-01-21 ENCOUNTER — Encounter: Payer: Self-pay | Admitting: Physical Therapy

## 2020-01-21 DIAGNOSIS — M25661 Stiffness of right knee, not elsewhere classified: Secondary | ICD-10-CM

## 2020-01-21 DIAGNOSIS — M6281 Muscle weakness (generalized): Secondary | ICD-10-CM

## 2020-01-21 DIAGNOSIS — M25561 Pain in right knee: Secondary | ICD-10-CM | POA: Diagnosis not present

## 2020-01-21 DIAGNOSIS — R2689 Other abnormalities of gait and mobility: Secondary | ICD-10-CM

## 2020-01-21 DIAGNOSIS — M25662 Stiffness of left knee, not elsewhere classified: Secondary | ICD-10-CM

## 2020-01-21 DIAGNOSIS — G8929 Other chronic pain: Secondary | ICD-10-CM

## 2020-01-21 NOTE — Therapy (Signed)
[facility] [address] [place], [place], [address] Phone: [phone]   Fax:  [phone]  Physical Therapy Treatment and Progress Note Reporting period: 11/26/2019 to 01/21/2020  Patient Details  Name: Gina Chambers MRN: [id number] Date of Birth: 1941-03-14 Referring Provider (PT): Rosalia Hammers, DO   Encounter Date: 01/21/2020   PT End of Session - 01/21/20 1344    Visit Number 10    Number of Visits 17    Date for PT Re-Evaluation 01/28/20    Authorization - Visit Number 5    Authorization - Number of Visits 10    PT Start Time 7711    PT Stop Time 1430    PT Time Calculation (min) 47 min    Equipment Utilized During Treatment Gait belt    Activity Tolerance Patient limited by fatigue    Behavior During Therapy [facility] for tasks assessed/performed           Past Medical History:  Diagnosis Date  . Asthma   . Asthmatic bronchitis , chronic ([place])   . CHF (congestive heart failure) ([place])   . Diabetes mellitus without complication ([place])   . 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 last one approx 2013  . Family history of adverse reaction to anesthesia    Mother - temporary paralysis of 1 side  . GERD (gastroesophageal reflux disease)   . Hypertension   . Neuropathy    feet  . PONV (postoperative nausea and vomiting)    Pt reports violent vomiting with ANY pain meds given with anesthesia.  . Sleep apnea    CPAP  . Thyroid disease   . Vertigo    daily    Past Surgical History:  Procedure Laterality Date  . CARDIAC SURGERY    . CATARACT EXTRACTION W/PHACO Right 03/11/2017   Procedure: CATARACT EXTRACTION PHACO AND INTRAOCULAR LENS PLACEMENT ([place])  RIGHT DIABETIC;  Surgeon: Eulogio Bear, MD;  Location: [place];  Service: Ophthalmology;  Laterality: Right;  Diabetic - insulin sleep apnea  . CATARACT EXTRACTION W/PHACO Left 04/02/2017   Procedure: CATARACT EXTRACTION PHACO AND  INTRAOCULAR LENS PLACEMENT ([place]) LEFT DIABETES;  Surgeon: Eulogio Bear, MD;  Location: [place];  Service: Ophthalmology;  Laterality: Left;  Diabetic - insulin  . COLONOSCOPY WITH PROPOFOL N/A 11/09/2016   Procedure: COLONOSCOPY WITH PROPOFOL;  Surgeon: Jonathon Bellows, MD;  Location: [facility]COPY;  Service: Gastroenterology;  Laterality: N/A;  . HERNIA REPAIR    . VERTICAL BANDED GASTROPLASTY      There were no vitals filed for this visit.   Subjective Assessment - 01/21/20 1344    Subjective Pt. states that she was not doing well yesterday, and she has been sleeping better overall.    Pertinent History hx of blood clots, CABG, and diabetic neuropathy.    Limitations Sitting    How long can you sit comfortably? ~30 mins    Patient Stated Goals wants to return to walking without pain    Currently in Pain? Yes    Pain Score 2     Pain Location Leg    Pain Orientation Left;Proximal            There Ex:  Nustep no resistance LE: 10 mins (unbilled)   5XSTS reassessment: 13.44s  Neuro Re-ed:  Outside walking: ascending/descending curbs, walking on grassy terrain, up/down hills. Pt provided SBA-CGA throughout. Pt demonstrated cautious gait with decreased step length bilat. Pt  required cueing to maintain looking up and forward. Pt better able to walk with improved step length bilat without looking at stairs.    Airex balance beam: Pt initially practiced tandem walking on blue line on agility ladder. Pt progressed to walking with R foot on beam, L foot off. Then progressed to tandem walking on airex beam. Pt progressed to side stepping on airex 2x each direction. Pt experienced few LOB, able to self correct with stepping strategy. Pt required CGA throughout.  Standing on Airex balance beam ball tosses: Pt standing on airex balance beam. Pt able to maintain balance with verbal cueing to maintain weight on toes. Pt completed multiple tosses outside BOS.                          PT Long Term Goals - 01/21/20 1351      PT LONG TERM GOAL #1   Title Pt. will report being able to walk for .5 miles with no increase in pain above 2/10 to improve pain free functional mobility.    Baseline initial: pt unable to walk long distances wtihout increase in pain; 9/9: pt able to walk for about 0.25 mile; 9/30: pt able to walk for about 0.25 miles    Time 4    Period Weeks    Status Partially Met    Target Date 01/28/20      PT LONG TERM GOAL #2   Title Pt. will be report being able to sit for more than 30 mins with no increase in pain above 2/10 to improve ability to do work related tasks.    Baseline initial: ~30 mins pt feels onset of R knee pain. 9/9: pt can sit for more than 30 mins with no increase in pain on R knee    Time 4    Period Weeks    Status Achieved    Target Date 12/31/19      PT LONG TERM GOAL #3   Title Pt. will improve 5XSTS to <12 sec to improve pain free functional mobility.    Baseline initial: 18.3 s; 9/9: 13.76 s 9/30: 13.44 s    Time 4    Period Weeks    Status Partially Met    Target Date 01/28/20      PT LONG TERM GOAL #4   Title Pt. will score 55 on FOTO to improve pain free functional mobility.    Baseline initial: 40; 9/9:43; 9/30: 43    Time 4    Period Weeks    Status Partially Met    Target Date 01/28/20                 Plan - 01/21/20 1428    Clinical Impression Statement Pt. presents to therapy with increased energy today. PT reassessed goals today for progress note, pt continuing to progress towards goals. See updated goals for details. Pt. completed outdoor walking with improved carryover from last time with ability to walk with increased step length bilat on grass, and no LOB experienced. Pt. completed dynamic airex balance activities with improved ability with mild verbal cueing for where to shift weight to maintain balance. Any LOB experienenced on balance beam was able to be  corrected by pt with stepping stratgey. Pt. will continue to benefit from skilled PT to improve balance and strength to decrease falls risk.    Personal Factors and Comorbidities Comorbidity 2    Comorbidities hx of blood clots, diabetes  Stability/Clinical Decision Making Evolving/Moderate complexity    Clinical Decision Making Moderate    Rehab Potential Good    PT Frequency 2x / week    PT Duration 4 weeks    PT Treatment/Interventions ADLs/Self Care Home Management;Neuromuscular re-education;Patient/family education;Cryotherapy;Balance training;Therapeutic exercise;Therapeutic activities;Functional mobility training;Stair training;Gait training;Passive range of motion;Manual techniques;Vestibular    PT Next Visit Plan review HEP    Consulted and Agree with Plan of Care Patient           Patient will benefit from skilled therapeutic intervention in order to improve the following deficits and impairments:  Abnormal gait, Decreased activity tolerance, Decreased balance, Decreased coordination, Impaired flexibility, Postural dysfunction, Impaired sensation, Hypomobility, Decreased strength, Difficulty walking, Decreased range of motion, Decreased endurance, Pain  Visit Diagnosis: Chronic pain of right knee  Muscle weakness (generalized)  Decreased range of motion of both lower extremities  Imbalance     Problem List Patient Active Problem List   Diagnosis Date Noted  . Chest pain 12/24/2018  . Hypoglycemia 11/07/2017  . GIB (gastrointestinal bleeding) 11/07/2016  . COPD exacerbation ([place]) 05/12/2016  . Community acquired pneumonia 05/12/2016  . Leukocytosis 05/12/2016  . Generalized weakness 05/12/2016    Carlyle Basques, SPT 01/21/2020, 3:31 PM  [facility] [address] [place], [place], [address] Phone: [phone]   Fax:  [phone]  Name: JULIANI LADUKE MRN: [id number] Date of Birth: June 30, 1940

## 2020-01-28 ENCOUNTER — Encounter: Payer: Self-pay | Admitting: Physical Therapy

## 2020-01-28 ENCOUNTER — Ambulatory Visit: Payer: Medicare Other | Attending: Sports Medicine | Admitting: Physical Therapy

## 2020-01-28 ENCOUNTER — Other Ambulatory Visit: Payer: Self-pay

## 2020-01-28 DIAGNOSIS — M25662 Stiffness of left knee, not elsewhere classified: Secondary | ICD-10-CM | POA: Insufficient documentation

## 2020-01-28 DIAGNOSIS — M25561 Pain in right knee: Secondary | ICD-10-CM | POA: Diagnosis present

## 2020-01-28 DIAGNOSIS — R2689 Other abnormalities of gait and mobility: Secondary | ICD-10-CM | POA: Diagnosis present

## 2020-01-28 DIAGNOSIS — M6281 Muscle weakness (generalized): Secondary | ICD-10-CM | POA: Diagnosis present

## 2020-01-28 DIAGNOSIS — G8929 Other chronic pain: Secondary | ICD-10-CM | POA: Diagnosis present

## 2020-01-28 DIAGNOSIS — M25661 Stiffness of right knee, not elsewhere classified: Secondary | ICD-10-CM

## 2020-01-28 NOTE — Therapy (Signed)
[facility] [address] [place], [place], [address] Phone: [phone]   Fax:  [phone]  Physical Therapy Treatment  Patient Details  Name: Gina Chambers MRN: [id number] Date of Birth: 16-Nov-1940 Referring Provider (PT): Rosalia Hammers, DO   Encounter Date: 01/28/2020   PT End of Session - 01/28/20 1321    Visit Number 11    Number of Visits 17    Date for PT Re-Evaluation 01/28/20    Authorization - Visit Number 6    Authorization - Number of Visits 10    PT Start Time 7681    PT Stop Time 1406    PT Time Calculation (min) 48 min    Equipment Utilized During Treatment Gait belt    Activity Tolerance Patient limited by fatigue    Behavior During Therapy [facility] for tasks assessed/performed           Past Medical History:  Diagnosis Date  . Asthma   . Asthmatic bronchitis , chronic ([place])   . CHF (congestive heart failure) ([place])   . Diabetes mellitus without complication ([place])   . 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 last one approx 2013  . Family history of adverse reaction to anesthesia    Mother - temporary paralysis of 1 side  . GERD (gastroesophageal reflux disease)   . Hypertension   . Neuropathy    feet  . PONV (postoperative nausea and vomiting)    Pt reports violent vomiting with ANY pain meds given with anesthesia.  . Sleep apnea    CPAP  . Thyroid disease   . Vertigo    daily    Past Surgical History:  Procedure Laterality Date  . CARDIAC SURGERY    . CATARACT EXTRACTION W/PHACO Right 03/11/2017   Procedure: CATARACT EXTRACTION PHACO AND INTRAOCULAR LENS PLACEMENT ([place])  RIGHT DIABETIC;  Surgeon: Eulogio Bear, MD;  Location: [place];  Service: Ophthalmology;  Laterality: Right;  Diabetic - insulin sleep apnea  . CATARACT EXTRACTION W/PHACO Left 04/02/2017   Procedure: CATARACT EXTRACTION PHACO AND INTRAOCULAR LENS PLACEMENT ([place]) LEFT DIABETES;  Surgeon: Eulogio Bear, MD;  Location: [place];  Service: Ophthalmology;  Laterality: Left;  Diabetic - insulin  . COLONOSCOPY WITH PROPOFOL N/A 11/09/2016   Procedure: COLONOSCOPY WITH PROPOFOL;  Surgeon: Jonathon Bellows, MD;  Location: [facility] ENDOSCOPY;  Service: Gastroenterology;  Laterality: N/A;  . HERNIA REPAIR    . VERTICAL BANDED GASTROPLASTY      There were no vitals filed for this visit.   Subjective Assessment - 01/28/20 1321    Subjective Pt. states her LE's are achey but not experiencing pain. Pt reports "being wobbly" today in the mornings relying on walls intermittently for support.    Pertinent History hx of blood clots, CABG, and diabetic neuropathy.    Limitations Sitting    How long can you sit comfortably? ~30 mins    Patient Stated Goals wants to return to walking without pain    Currently in Pain? No/denies    Pain Score 0-No pain              There.ex:   Nu-Step L2 for 10 min. LE's only. Not billed.   Neuro Re-Ed:   Reaching outside BOS cone grabs to initiate stepping strategy: x5 cones with CGA+1.   Soccer ball passes to incorporate coordination and SLS: x5 RLE/x5 LLE. Progressed to taking 2 steps then kicking all. 1x5  bilaterally to initiate stepping strategy. CGA+1  SLS on airex pad in // bars. No UE support. CGA+1: 30 sec eyes closed.   Progressed to reciprocal marches on airex pad: x8/LE. Noted B sway. CGA+1. Reports of R hip pain.   Standing on bosu ball in // bars: 3 min total   Stepping on and over bosu ball in // bars: x8. CGA+1. No UE support. Verbal cues to improve eccentric control stepping off of ball onto flat surface.  Tandem stance on 1/2 bolster in // bars: x2. Intermittent sway and UE support needed on // bars. CGA+1   Pt required frequent seated rest breaks 2/2 SOB from asthma.       PT Education - 01/28/20 1412    Education Details form/technique with exercise. Education on importance of stepping strategy to prevent falls.     Person(s) Educated Patient    Methods Explanation;Demonstration    Comprehension Verbalized understanding;Returned demonstration               PT Long Term Goals - 01/21/20 1351      PT LONG TERM GOAL #1   Title Pt. will report being able to walk for .5 miles with no increase in pain above 2/10 to improve pain free functional mobility.    Baseline initial: pt unable to walk long distances wtihout increase in pain; 9/9: pt able to walk for about 0.25 mile; 9/30: pt able to walk for about 0.25 miles    Time 4    Period Weeks    Status Partially Met    Target Date 01/28/20      PT LONG TERM GOAL #2   Title Pt. will be report being able to sit for more than 30 mins with no increase in pain above 2/10 to improve ability to do work related tasks.    Baseline initial: ~30 mins pt feels onset of R knee pain. 9/9: pt can sit for more than 30 mins with no increase in pain on R knee    Time 4    Period Weeks    Status Achieved    Target Date 12/31/19      PT LONG TERM GOAL #3   Title Pt. will improve 5XSTS to <12 sec to improve pain free functional mobility.    Baseline initial: 18.3 s; 9/9: 13.76 s 9/30: 13.44 s    Time 4    Period Weeks    Status Partially Met    Target Date 01/28/20      PT LONG TERM GOAL #4   Title Pt. will score 55 on FOTO to improve pain free functional mobility.    Baseline initial: 40; 9/9:43; 9/30: 43    Time 4    Period Weeks    Status Partially Met    Target Date 01/28/20                 Plan - 01/28/20 1407    Clinical Impression Statement Today's focus on dynamic balance to improve ankle/hip/stepping strategy. Pt demonstrated ability to perform cone reaches outside BOS to incorporate stepping strategy including kicking soccerball. No LOB noted. Most difficulty with stepping onto and over bosu ball requiring intermitten sway and LOB requiring UE assist on // bars. Pt can continue to benefit from skilled PT to improve functional mobility.     Personal Factors and Comorbidities Comorbidity 2    Comorbidities hx of blood clots, diabetes    Stability/Clinical Decision Making Evolving/Moderate complexity    Clinical  Decision Making Moderate    Rehab Potential Good    PT Frequency 2x / week    PT Duration 4 weeks    PT Treatment/Interventions ADLs/Self Care Home Management;Neuromuscular re-education;Patient/family education;Cryotherapy;Balance training;Therapeutic exercise;Therapeutic activities;Functional mobility training;Stair training;Gait training;Passive range of motion;Manual techniques;Vestibular    PT Next Visit Plan RECERT next tx. session    Consulted and Agree with Plan of Care Patient           Patient will benefit from skilled therapeutic intervention in order to improve the following deficits and impairments:  Abnormal gait, Decreased activity tolerance, Decreased balance, Decreased coordination, Impaired flexibility, Postural dysfunction, Impaired sensation, Hypomobility, Decreased strength, Difficulty walking, Decreased range of motion, Decreased endurance, Pain  Visit Diagnosis: Chronic pain of right knee  Muscle weakness (generalized)  Decreased range of motion of both lower extremities  Imbalance     Problem List Patient Active Problem List   Diagnosis Date Noted  . Chest pain 12/24/2018  . Hypoglycemia 11/07/2017  . GIB (gastrointestinal bleeding) 11/07/2016  . COPD exacerbation ([place]) 05/12/2016  . Community acquired pneumonia 05/12/2016  . Leukocytosis 05/12/2016  . Generalized weakness 05/12/2016   Pura Spice, PT, DPT # [phone] 01/28/2020, 3:44 PM  [facility] [address] [place], [place], [address] Phone: [phone]   Fax:  [phone]  Name: Gina Chambers MRN: [id number] Date of Birth: February 25, 1941

## 2020-01-28 NOTE — Therapy (Deleted)
[facility] [address] [place], [place], [address] Phone: [phone]   Fax:  [phone]  Physical Therapy Treatment  Patient Details  Name: Gina Chambers MRN: [id number] Date of Birth: [birth year] Referring Provider (PT): Rosalia Hammers, DO   Encounter Date: 01/28/2020   PT End of Session - 01/28/20 1321    Visit Number 11    Number of Visits 17    Date for PT Re-Evaluation 01/28/20    Authorization - Visit Number 6    Authorization - Number of Visits 10    PT Start Time 4665    PT Stop Time 1406    PT Time Calculation (min) 48 min    Equipment Utilized During Treatment Gait belt    Activity Tolerance Patient limited by fatigue    Behavior During Therapy [facility] for tasks assessed/performed           Past Medical History:  Diagnosis Date  . Asthma   . Asthmatic bronchitis , chronic ([place])   . CHF (congestive heart failure) ([place])   . Diabetes mellitus without complication ([place])   . 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 last one approx 2013  . Family history of adverse reaction to anesthesia    Mother - temporary paralysis of 1 side  . GERD (gastroesophageal reflux disease)   . Hypertension   . Neuropathy    feet  . PONV (postoperative nausea and vomiting)    Pt reports violent vomiting with ANY pain meds given with anesthesia.  . Sleep apnea    CPAP  . Thyroid disease   . Vertigo    daily    Past Surgical History:  Procedure Laterality Date  . CARDIAC SURGERY    . CATARACT EXTRACTION W/PHACO Right 03/11/2017   Procedure: CATARACT EXTRACTION PHACO AND INTRAOCULAR LENS PLACEMENT ([place])  RIGHT DIABETIC;  Surgeon: Eulogio Bear, MD;  Location: [place];  Service: Ophthalmology;  Laterality: Right;  Diabetic - insulin sleep apnea  . CATARACT EXTRACTION W/PHACO Left 04/02/2017   Procedure: CATARACT EXTRACTION PHACO AND INTRAOCULAR LENS PLACEMENT ([place]) LEFT DIABETES;  Surgeon: Eulogio Bear, MD;  Location: [place];  Service: Ophthalmology;  Laterality: Left;  Diabetic - insulin  . COLONOSCOPY WITH PROPOFOL N/A 11/09/2016   Procedure: COLONOSCOPY WITH PROPOFOL;  Surgeon: Jonathon Bellows, MD;  Location[facility] ENDOSCOPY;  Service: Gastroenterology;  Laterality: N/A;  . HERNIA REPAIR    . VERTICAL BANDED GASTROPLASTY      There were no vitals filed for this visit.   Subjective Assessment - 01/28/20 1321    Subjective Pt. states her LE's are achey but not experiencing pain. Pt reports "being wobbly" today in the mornings relying on walls intermittently for support.    Pertinent History hx of blood clots, CABG, and diabetic neuropathy.    Limitations Sitting    How long can you sit comfortably? ~30 mins    Patient Stated Goals wants to return to walking without pain    Currently in Pain? No/denies    Pain Score 0-No pain          There.ex:   Nu-Step L2 for 10 min. LE's only. Not billed.   Neuro Re-Ed:   Reaching outside BOS cone grabs to initiate stepping strategy: x5 cones with CGA+1.   Soccer ball passes to incorporate coordination and SLS: x5 RLE/x5 LLE. Progressed to taking 2 steps then kicking all. 1x5 bilaterally to initiate stepping  strategy. CGA+1  SLS on airex pad in // bars. No UE support. CGA+1: 30 sec eyes closed.   Progressed to reciprocal marches on airex pad: x8/LE. Noted B sway. CGA+1. Reports of R hip pain.   Standing on bosu ball in // bars: 3 min total   Stepping on and over bosu ball in // bars: x8. CGA+1. No UE support. Verbal cues to improve eccentric control stepping off of ball onto flat surface.  Tandem stance on 1/2 bolster in // bars: x2. Intermittent sway and UE support needed on // bars. CGA+1   Pt required frequent seated rest breaks 2/2 SOB from asthma.    PT Long Term Goals - 01/21/20 1351      PT LONG TERM GOAL #1   Title Pt. will report being able to walk for .5 miles with no increase in pain above 2/10 to improve  pain free functional mobility.    Baseline initial: pt unable to walk long distances wtihout increase in pain; 9/9: pt able to walk for about 0.25 mile; 9/30: pt able to walk for about 0.25 miles    Time 4    Period Weeks    Status Partially Met    Target Date 01/28/20      PT LONG TERM GOAL #2   Title Pt. will be report being able to sit for more than 30 mins with no increase in pain above 2/10 to improve ability to do work related tasks.    Baseline initial: ~30 mins pt feels onset of R knee pain. 9/9: pt can sit for more than 30 mins with no increase in pain on R knee    Time 4    Period Weeks    Status Achieved    Target Date 12/31/19      PT LONG TERM GOAL #3   Title Pt. will improve 5XSTS to <12 sec to improve pain free functional mobility.    Baseline initial: 18.3 s; 9/9: 13.76 s 9/30: 13.44 s    Time 4    Period Weeks    Status Partially Met    Target Date 01/28/20      PT LONG TERM GOAL #4   Title Pt. will score 55 on FOTO to improve pain free functional mobility.    Baseline initial: 40; 9/9:43; 9/30: 43    Time 4    Period Weeks    Status Partially Met    Target Date 01/28/20            Patient will benefit from skilled therapeutic intervention in order to improve the following deficits and impairments:  Abnormal gait, Decreased activity tolerance, Decreased balance, Decreased coordination, Impaired flexibility, Postural dysfunction, Impaired sensation, Hypomobility, Decreased strength, Difficulty walking, Decreased range of motion, Decreased endurance, Pain  Visit Diagnosis: Chronic pain of right knee  Muscle weakness (generalized)  Decreased range of motion of both lower extremities  Imbalance     Problem List Patient Active Problem List   Diagnosis Date Noted  . Chest pain 12/24/2018  . Hypoglycemia 11/07/2017  . GIB (gastrointestinal bleeding) 11/07/2016  . COPD exacerbation ([place]) 05/12/2016  . Community acquired pneumonia 05/12/2016  .  Leukocytosis 05/12/2016  . Generalized weakness 05/12/2016    Carlyle Basques, SPT 01/28/2020, 3:44 PM  [facility] [address] [place], [place], [address] Phone: [phone]   Fax:  [phone]  Name: Gina Chambers MRN: [id number] Date of Birth: 11/01/1940

## 2020-02-02 ENCOUNTER — Encounter: Payer: Self-pay | Admitting: Physical Therapy

## 2020-02-02 ENCOUNTER — Other Ambulatory Visit: Payer: Self-pay

## 2020-02-02 ENCOUNTER — Ambulatory Visit: Payer: Medicare Other

## 2020-02-02 DIAGNOSIS — M25661 Stiffness of right knee, not elsewhere classified: Secondary | ICD-10-CM

## 2020-02-02 DIAGNOSIS — G8929 Other chronic pain: Secondary | ICD-10-CM

## 2020-02-02 DIAGNOSIS — M25561 Pain in right knee: Secondary | ICD-10-CM | POA: Diagnosis not present

## 2020-02-02 DIAGNOSIS — M6281 Muscle weakness (generalized): Secondary | ICD-10-CM

## 2020-02-02 DIAGNOSIS — R2689 Other abnormalities of gait and mobility: Secondary | ICD-10-CM

## 2020-02-02 NOTE — Therapy (Signed)
[facility] [address] [place], [place], [address] Phone: [phone]   Fax:  [phone]  Physical Therapy Treatment and Recertification: [id number]   Patient Details  Name: Gina Chambers MRN: [id number] Date of Birth: 08-Mar-1941 Referring Provider (PT): Rosalia Hammers, DO   Encounter Date: 02/02/2020   PT End of Session - 02/02/20 1357    Visit Number 12    Number of Visits 20    Date for PT Re-Evaluation 03/01/20    Authorization - Visit Number 1    Authorization - Number of Visits 10    PT Start Time 1344    PT Stop Time 1432    PT Time Calculation (min) 48 min    Equipment Utilized During Treatment Gait belt    Activity Tolerance Patient limited by fatigue    Behavior During Therapy WFL for tasks assessed/performed           Past Medical History:  Diagnosis Date   Asthma    Asthmatic bronchitis , chronic (HCC)    CHF (congestive heart failure) ([place])    Diabetes mellitus without complication ([place])    DVT (deep venous thrombosis) (HCC)    x3 - both legs. last one approx 2013   Family history of adverse reaction to anesthesia    Mother - temporary paralysis of 1 side   GERD (gastroesophageal reflux disease)    Hypertension    Neuropathy    feet   PONV (postoperative nausea and vomiting)    Pt reports violent vomiting with ANY pain meds given with anesthesia.   Sleep apnea    CPAP   Thyroid disease    Vertigo    daily    Past Surgical History:  Procedure Laterality Date   CARDIAC SURGERY     CATARACT EXTRACTION W/PHACO Right 03/11/2017   Procedure: CATARACT EXTRACTION PHACO AND INTRAOCULAR LENS PLACEMENT ([place])  RIGHT DIABETIC;  Surgeon: Eulogio Bear, MD;  Location: [place];  Service: Ophthalmology;  Laterality: Right;  Diabetic - insulin sleep apnea   CATARACT EXTRACTION W/PHACO Left 04/02/2017   Procedure: CATARACT EXTRACTION PHACO AND INTRAOCULAR LENS  PLACEMENT ([place]) LEFT DIABETES;  Surgeon: Eulogio Bear, MD;  Location: [place];  Service: Ophthalmology;  Laterality: Left;  Diabetic - insulin   COLONOSCOPY WITH PROPOFOL N/A 11/09/2016   Procedure: COLONOSCOPY WITH PROPOFOL;  Surgeon: Jonathon Bellows, MD;  Location: [facility] END[facility]COPY;  Service: Gastroenterology;  Laterality: N/A;   HERNIA REPAIR     VERTICAL BANDED GASTROPLASTY      There were no vitals filed for this visit.   Subjective Assessment - 02/02/20 1356    Subjective Pt. states that her balance has been feeling good. Pt. reports no near falls or falls.    Pertinent History hx of blood clots, CABG, and diabetic neuropathy.    Limitations Sitting    How long can you sit comfortably? ~30 mins    Patient Stated Goals wants to return to walking without pain    Currently in Pain? No/denies            There ex:  Nustep L3 10 mins LE only  Neuro re-ed:  Outdoor walking with head turns: pt able to complete horizontal head turns, unable to complete vertical head turns with clouds being "too blinding." Pt came inside and performed vertical head turns in hallway, no LOB noted.   Resisted theraband walking: 5x forwards and backwards. Pt experienced few LOB, able to self correct  with use of UE and stepping strategy. Pt required SBA to complete.   Standing on airex with 4lb weighted ball tosses to rebounder: Pt able to maintain balance while standing and tossing ball. Pt maintained balance with ankle and hip strategy.                               PT Long Term Goals - 02/02/20 1456      PT LONG TERM GOAL #1   Title Pt. will report being able to walk for .5 miles with no increase in pain above 2/10 to improve pain free functional mobility.    Baseline initial: pt unable to walk long distances wtihout increase in pain; 9/9: pt able to walk for about 0.25 mile; 9/30: pt able to walk for about 0.25 miles    Time 4    Period Weeks    Status  Partially Met    Target Date 03/01/20      PT LONG TERM GOAL #2   Title Pt. will be report being able to sit for more than 30 mins with no increase in pain above 2/10 to improve ability to do work related tasks.    Baseline initial: ~30 mins pt feels onset of R knee pain. 9/9: pt can sit for more than 30 mins with no increase in pain on R knee    Time 4    Period Weeks    Status Achieved    Target Date 12/31/19      PT LONG TERM GOAL #3   Title Pt. will improve 5XSTS to <12 sec to improve pain free functional mobility.    Baseline initial: 18.3 s; 9/9: 13.76 s 9/30: 13.44 s    Time 4    Period Weeks    Status Partially Met    Target Date 03/01/20      PT LONG TERM GOAL #4   Title Pt. will score 55 on FOTO to improve pain free functional mobility.    Baseline initial: 40; 9/9:43; 9/30: 43    Time 4    Period Weeks    Status Partially Met    Target Date 03/01/20                 Plan - 02/02/20 1451    Clinical Impression Statement Pt. continues to focus on dynamic balance activities and demonstrates consistent use of stepping strategy to recover. Pt. continues to demonstrate improvements in gait with decrease LOB noted throughout session. Pt. experienced most dififculty with resisted theraband walking with being able to take large steps and maintain balance with ankle/hip strategy before using stepping strategy. Pt. will continue to benefit from skilled PT to improve balance and strength to decrease falls risk.    Personal Factors and Comorbidities Comorbidity 2    Comorbidities hx of blood clots, diabetes    Stability/Clinical Decision Making Evolving/Moderate complexity    Clinical Decision Making Moderate    Rehab Potential Good    PT Frequency 2x / week    PT Duration 4 weeks    PT Treatment/Interventions ADLs/Self Care Home Management;Neuromuscular re-education;Patient/family education;Cryotherapy;Balance training;Therapeutic exercise;Therapeutic activities;Functional  mobility training;Stair training;Gait training;Passive range of motion;Manual techniques;Vestibular    PT Next Visit Plan ankle and hip strategy    Consulted and Agree with Plan of Care Patient           Patient will benefit from skilled therapeutic intervention in order to improve the  following deficits and impairments:  Abnormal gait, Decreased activity tolerance, Decreased balance, Decreased coordination, Impaired flexibility, Postural dysfunction, Impaired sensation, Hypomobility, Decreased strength, Difficulty walking, Decreased range of motion, Decreased endurance, Pain  Visit Diagnosis: Chronic pain of right knee  Muscle weakness (generalized)  Decreased range of motion of both lower extremities  Imbalance     Problem List Patient Active Problem List   Diagnosis Date Noted   Chest pain 12/24/2018   Hypoglycemia 11/07/2017   GIB (gastrointestinal bleeding) 11/07/2016   COPD exacerbation ([place]) 05/12/2016   Community acquired pneumonia 05/12/2016   Leukocytosis 05/12/2016   Generalized weakness 05/12/2016    Carlyle Basques, SPT 02/02/2020, 2:58 PM  [facility] [address] [place], [place], [address] Phone: [phone]   Fax:  [phone]  Name: Gina Chambers MRN: [id number] Date of Birth: 05-14-1940

## 2020-02-04 ENCOUNTER — Other Ambulatory Visit: Payer: Self-pay

## 2020-02-04 ENCOUNTER — Ambulatory Visit: Payer: Medicare Other

## 2020-02-04 ENCOUNTER — Encounter: Payer: Self-pay | Admitting: Physical Therapy

## 2020-02-04 DIAGNOSIS — G8929 Other chronic pain: Secondary | ICD-10-CM

## 2020-02-04 DIAGNOSIS — M25662 Stiffness of left knee, not elsewhere classified: Secondary | ICD-10-CM

## 2020-02-04 DIAGNOSIS — M25661 Stiffness of right knee, not elsewhere classified: Secondary | ICD-10-CM

## 2020-02-04 DIAGNOSIS — M25561 Pain in right knee: Secondary | ICD-10-CM

## 2020-02-04 DIAGNOSIS — M6281 Muscle weakness (generalized): Secondary | ICD-10-CM

## 2020-02-04 DIAGNOSIS — R2689 Other abnormalities of gait and mobility: Secondary | ICD-10-CM

## 2020-02-04 NOTE — Therapy (Signed)
[facility] [address] [place], [place], [address] Phone: [phone]   Fax:  [phone]  Physical Therapy Treatment  Patient Details  Name: Gina Chambers MRN: [id number] Date of Birth: 1940-12-25 Referring Provider (PT): Rosalia Hammers, DO   Encounter Date: 02/04/2020   PT End of Session - 02/04/20 1405    Visit Number 13    Number of Visits 20    Date for PT Re-Evaluation 03/01/20    Authorization - Visit Number 2    Authorization - Number of Visits 10    PT Start Time 8786    PT Stop Time 1440    PT Time Calculation (min) 52 min    Equipment Utilized During Treatment Gait belt    Activity Tolerance Patient limited by fatigue    Behavior During Therapy WFL for tasks assessed/performed           Past Medical History:  Diagnosis Date   Asthma    Asthmatic bronchitis , chronic (HCC)    CHF (congestive heart failure) ([place])    Diabetes mellitus without complication ([place])    DVT (deep venous thrombosis) (HCC)    x3 - both legs. last one approx 2013   Family history of adverse reaction to anesthesia    Mother - temporary paralysis of 1 side   GERD (gastroesophageal reflux disease)    Hypertension    Neuropathy    feet   PONV (postoperative nausea and vomiting)    Pt reports violent vomiting with ANY pain meds given with anesthesia.   Sleep apnea    CPAP   Thyroid disease    Vertigo    daily    Past Surgical History:  Procedure Laterality Date   CARDIAC SURGERY     CATARACT EXTRACTION W/PHACO Right 03/11/2017   Procedure: CATARACT EXTRACTION PHACO AND INTRAOCULAR LENS PLACEMENT ([place])  RIGHT DIABETIC;  Surgeon: Eulogio Bear, MD;  Location: [place];  Service: Ophthalmology;  Laterality: Right;  Diabetic - insulin sleep apnea   CATARACT EXTRACTION W/PHACO Left 04/02/2017   Procedure: CATARACT EXTRACTION PHACO AND INTRAOCULAR LENS PLACEMENT ([place]) LEFT DIABETES;  Surgeon: Eulogio Bear, MD;  Location: [place];  Service: Ophthalmology;  Laterality: Left;  Diabetic - insulin   COLONOSCOPY WITH PROPOFOL N/A 11/09/2016   Procedure: COLONOSCOPY WITH PROPOFOL;  Surgeon: Jonathon Bellows, MD;  Location: [facility] ENDOSCOPY;  Service: Gastroenterology;  Laterality: N/A;   HERNIA REPAIR     VERTICAL BANDED GASTROPLASTY      There were no vitals filed for this visit.   Subjective Assessment - 02/04/20 1404    Subjective Pt rpeorts B hip pain at 2/10 NPS. No falls.    Pertinent History hx of blood clots, CABG, and diabetic neuropathy.    Limitations Sitting    How long can you sit comfortably? ~30 mins    Patient Stated Goals wants to return to walking without pain    Currently in Pain? Yes    Pain Score 2     Pain Location Hip    Pain Orientation Right;Left    Pain Descriptors / Indicators Aching;Discomfort    Pain Type Chronic pain    Pain Onset More than a month ago    Pain Frequency Intermittent             Neuro Re-Ed:   Rebounder with 4 lbs med ball on airex pad feet together to improve ankle/hip strategy: 1x15 tosses. CGA+1   Rebounder  with 4 lbs ball on airex pad tandem stance for ankle hip strategy: 1x15.   4 way resisted walking at [employer]: 20 lbs, x5 forwards and backwards and x2 side steps R and L. CGA+1. Intermittent stepping to regain balance but able to indep  Stepping ball kicks to outside BOS to R and L. 5 min to incorporate stepping strategy and SLS and coordination.   Alternating cone taps in // bars SBA+1 no UE support. X6.    2 seated rest breaks throughout therapy session.   Pt spent extra 10 min in gym after PT session to use Nu-Step. Not billed.     PT Education - 02/04/20 1405    Education Details form/technique with exercise.    Person(s) Educated Patient    Methods Explanation;Demonstration    Comprehension Verbalized understanding;Returned demonstration               PT Long Term Goals - 02/02/20 1456       PT LONG TERM GOAL #1   Title Pt. will report being able to walk for .5 miles with no increase in pain above 2/10 to improve pain free functional mobility.    Baseline initial: pt unable to walk long distances wtihout increase in pain; 9/9: pt able to walk for about 0.25 mile; 9/30: pt able to walk for about 0.25 miles    Time 4    Period Weeks    Status Partially Met    Target Date 03/01/20      PT LONG TERM GOAL #2   Title Pt. will be report being able to sit for more than 30 mins with no increase in pain above 2/10 to improve ability to do work related tasks.    Baseline initial: ~30 mins pt feels onset of R knee pain. 9/9: pt can sit for more than 30 mins with no increase in pain on R knee    Time 4    Period Weeks    Status Achieved    Target Date 12/31/19      PT LONG TERM GOAL #3   Title Pt. will improve 5XSTS to <12 sec to improve pain free functional mobility.    Baseline initial: 18.3 s; 9/9: 13.76 s 9/30: 13.44 s    Time 4    Period Weeks    Status Partially Met    Target Date 03/01/20      PT LONG TERM GOAL #4   Title Pt. will score 55 on FOTO to improve pain free functional mobility.    Baseline initial: 40; 9/9:43; 9/30: 43    Time 4    Period Weeks    Status Partially Met    Target Date 03/01/20                 Plan - 02/04/20 1438    Clinical Impression Statement Today's focus on practicing pt's ankle and hip strategy. Pt progressing with ball kicks to incorporate SLS and stepping strategy with ability to to indep step to kick ball with no LOB. Pt most difficulty with tandem stance on foam passing 4 lbs ball on rebounder with ability to utilize ankle and hip strategy with CGA+1 from therapist. Pt can continue to benefit from skilled PT treatment to improve functional mobility and reduce risk of falls.    Personal Factors and Comorbidities Comorbidity 2    Comorbidities hx of blood clots, diabetes    Stability/Clinical Decision Making Evolving/Moderate  complexity    Clinical Decision Making Moderate  Rehab Potential Good    PT Frequency 2x / week    PT Duration 4 weeks    PT Treatment/Interventions ADLs/Self Care Home Management;Neuromuscular re-education;Patient/family education;Cryotherapy;Balance training;Therapeutic exercise;Therapeutic activities;Functional mobility training;Stair training;Gait training;Passive range of motion;Manual techniques;Vestibular    PT Next Visit Plan ankle and hip strategy    Consulted and Agree with Plan of Care Patient           Patient will benefit from skilled therapeutic intervention in order to improve the following deficits and impairments:  Abnormal gait, Decreased activity tolerance, Decreased balance, Decreased coordination, Impaired flexibility, Postural dysfunction, Impaired sensation, Hypomobility, Decreased strength, Difficulty walking, Decreased range of motion, Decreased endurance, Pain  Visit Diagnosis: Chronic pain of right knee  Muscle weakness (generalized)  Decreased range of motion of both lower extremities  Imbalance     Problem List Patient Active Problem List   Diagnosis Date Noted   Chest pain 12/24/2018   Hypoglycemia 11/07/2017   GIB (gastrointestinal bleeding) 11/07/2016   COPD exacerbation ([place]) 05/12/2016   Community acquired pneumonia 05/12/2016   Leukocytosis 05/12/2016   Generalized weakness 05/12/2016    Larna Daughters, SPT 02/04/2020, 2:54 PM  [facility] [address] [place], [place], [address] Phone: [phone]   Fax:  [phone]  Name: Gina Chambers MRN: [id number] Date of Birth: [birth year]

## 2020-02-09 ENCOUNTER — Ambulatory Visit: Payer: Medicare Other | Admitting: Physical Therapy

## 2020-02-11 ENCOUNTER — Encounter: Payer: Self-pay | Admitting: Physical Therapy

## 2020-02-11 ENCOUNTER — Ambulatory Visit: Payer: Medicare Other | Admitting: Physical Therapy

## 2020-02-11 ENCOUNTER — Other Ambulatory Visit: Payer: Self-pay

## 2020-02-11 DIAGNOSIS — M25661 Stiffness of right knee, not elsewhere classified: Secondary | ICD-10-CM

## 2020-02-11 DIAGNOSIS — M25561 Pain in right knee: Secondary | ICD-10-CM | POA: Diagnosis not present

## 2020-02-11 DIAGNOSIS — M6281 Muscle weakness (generalized): Secondary | ICD-10-CM

## 2020-02-11 DIAGNOSIS — G8929 Other chronic pain: Secondary | ICD-10-CM

## 2020-02-11 DIAGNOSIS — R2689 Other abnormalities of gait and mobility: Secondary | ICD-10-CM

## 2020-02-11 NOTE — Therapy (Signed)
[facility] [address]. [place], [place], [address] Phone: [phone]   Fax:  [phone]  Physical Therapy Treatment  Patient Details  Name: Gina Chambers MRN: [id number] Date of Birth: 12-Nov-1940 Referring Provider (PT): Rosalia Hammers, DO   Encounter Date: 02/11/2020   PT End of Session - 02/11/20 1257    Visit Number 14    Number of Visits 20    Date for PT Re-Evaluation 03/01/20    Authorization - Visit Number 3    Authorization - Number of Visits 10    PT Start Time 1252    PT Stop Time 1345    PT Time Calculation (min) 53 min    Equipment Utilized During Treatment Gait belt    Activity Tolerance Patient limited by fatigue    Behavior During Therapy [facility] for tasks assessed/performed           Past Medical History:  Diagnosis Date  . Asthma   . Asthmatic bronchitis , chronic ([place])   . CHF (congestive heart failure) ([place])   . Diabetes mellitus without complication ([place])   . 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 last one approx 2013  . Family history of adverse reaction to anesthesia    Mother - temporary paralysis of 1 side  . GERD (gastroesophageal reflux disease)   . Hypertension   . Neuropathy    feet  . PONV (postoperative nausea and vomiting)    Pt reports violent vomiting with ANY pain meds given with anesthesia.  . Sleep apnea    CPAP  . Thyroid disease   . Vertigo    daily    Past Surgical History:  Procedure Laterality Date  . CARDIAC SURGERY    . CATARACT EXTRACTION W/PHACO Right 03/11/2017   Procedure: CATARACT EXTRACTION PHACO AND INTRAOCULAR LENS PLACEMENT ([place])  RIGHT DIABETIC;  Surgeon: Eulogio Bear, MD;  Location: [place];  Service: Ophthalmology;  Laterality: Right;  Diabetic - insulin sleep apnea  . CATARACT EXTRACTION W/PHACO Left 04/02/2017   Procedure: CATARACT EXTRACTION PHACO AND INTRAOCULAR LENS PLACEMENT ([place]) LEFT DIABETES;  Surgeon: Eulogio Bear, MD;  Location: [place];  Service: Ophthalmology;  Laterality: Left;  Diabetic - insulin  . COLONOSCOPY WITH PROPOFOL N/A 11/09/2016   Procedure: COLONOSCOPY WITH PROPOFOL;  Surgeon: Jonathon Bellows, MD;  Location: [facility] ENDOSCOPY;  Service: Gastroenterology;  Laterality: N/A;  . HERNIA REPAIR    . VERTICAL BANDED GASTROPLASTY      There were no vitals filed for this visit.   Subjective Assessment - 02/11/20 1251    Subjective Pt. states that her hips are about 2/10 NPS. No falls no near falls reported.    Pertinent History hx of blood clots, CABG, and diabetic neuropathy.    Limitations Sitting    How long can you sit comfortably? ~30 mins    Patient Stated Goals wants to return to walking without pain    Currently in Pain? Yes    Pain Score 2     Pain Location Hip    Pain Orientation Right;Left    Pain Descriptors / Indicators Aching    Pain Onset More than a month ago             There Ex:  Nustep L2 8 mins: unbilled  STS: 5x without weight. 2x10 holding 4lb weight. Cueing for decreasing speed of activity and for maintaining breathing throughout activity   Standing lunge  onto 6" step tossing 4lb ball onto rebounder: 2x10 each leg on 6" step. Cueing to shift weight onto leg on step. First set with step about 2 feet away from rebounder, second set with step about 3 feet away.   Single leg taps onto step: 2x10 each leg. Cueing to maintain glute and quad contraction on stance leg throughout. Pt experienced more LOB standing on R leg.   Stair practice: pt practiced 2x up/down stairs. Pt required cueing for maintaining contraction on stance leg during stepping and maintaining upright posture when going down stairs. Pt used 1-2 hands on handrails throughout. CGA provided.          PT Long Term Goals - 02/02/20 1456      PT LONG TERM GOAL #1   Title Pt. will report being able to walk for .5 miles with no increase in pain above 2/10 to improve pain free  functional mobility.    Baseline initial: pt unable to walk long distances wtihout increase in pain; 9/9: pt able to walk for about 0.25 mile; 9/30: pt able to walk for about 0.25 miles    Time 4    Period Weeks    Status Partially Met    Target Date 03/01/20      PT LONG TERM GOAL #2   Title Pt. will be report being able to sit for more than 30 mins with no increase in pain above 2/10 to improve ability to do work related tasks.    Baseline initial: ~30 mins pt feels onset of R knee pain. 9/9: pt can sit for more than 30 mins with no increase in pain on R knee    Time 4    Period Weeks    Status Achieved    Target Date 12/31/19      PT LONG TERM GOAL #3   Title Pt. will improve 5XSTS to <12 sec to improve pain free functional mobility.    Baseline initial: 18.3 s; 9/9: 13.76 s 9/30: 13.44 s    Time 4    Period Weeks    Status Partially Met    Target Date 03/01/20      PT LONG TERM GOAL #4   Title Pt. will score 55 on FOTO to improve pain free functional mobility.    Baseline initial: 40; 9/9:43; 9/30: 43    Time 4    Period Weeks    Status Partially Met    Target Date 03/01/20                 Plan - 02/11/20 1352    Clinical Impression Statement Pt. focused on improving strength and balance. Pt. experienced more LOB standing on R leg compared to L, able to correct with cueing to increase contraction of R glute and quads. Pt. practiced SLS stance activities as well as stairs today, pt able to complete all activities with no LOB. Pt. will continue to benefit from skilled PT to improve functional mobility and decrease falls risk.    Personal Factors and Comorbidities Comorbidity 2    Comorbidities hx of blood clots, diabetes    Stability/Clinical Decision Making Evolving/Moderate complexity    Clinical Decision Making Moderate    Rehab Potential Good    PT Frequency 2x / week    PT Duration 4 weeks    PT Treatment/Interventions ADLs/Self Care Home  Management;Neuromuscular re-education;Patient/family education;Cryotherapy;Balance training;Therapeutic exercise;Therapeutic activities;Functional mobility training;Stair training;Gait training;Passive range of motion;Manual techniques;Vestibular    PT Next Visit Plan  ankle and hip strategy    Consulted and Agree with Plan of Care Patient           Patient will benefit from skilled therapeutic intervention in order to improve the following deficits and impairments:  Abnormal gait, Decreased activity tolerance, Decreased balance, Decreased coordination, Impaired flexibility, Postural dysfunction, Impaired sensation, Hypomobility, Decreased strength, Difficulty walking, Decreased range of motion, Decreased endurance, Pain  Visit Diagnosis: Chronic pain of right knee  Muscle weakness (generalized)  Decreased range of motion of both lower extremities  Imbalance     Problem List Patient Active Problem List   Diagnosis Date Noted  . Chest pain 12/24/2018  . Hypoglycemia 11/07/2017  . GIB (gastrointestinal bleeding) 11/07/2016  . COPD exacerbation ([place]) 05/12/2016  . Community acquired pneumonia 05/12/2016  . Leukocytosis 05/12/2016  . Generalized weakness 05/12/2016   Pura Spice, PT, DPT # [id number] Carlyle Basques, SPT 02/11/2020, 1:56 PM  [facility] [address] [place], [place], [address] Phone: [phone]   Fax:  [phone]  Name: Gina Chambers MRN: [id number] Date of Birth: 10-14-1940

## 2020-02-16 ENCOUNTER — Other Ambulatory Visit: Payer: Self-pay

## 2020-02-16 ENCOUNTER — Encounter: Payer: Self-pay | Admitting: Physical Therapy

## 2020-02-16 ENCOUNTER — Ambulatory Visit: Payer: Medicare Other

## 2020-02-16 DIAGNOSIS — M6281 Muscle weakness (generalized): Secondary | ICD-10-CM

## 2020-02-16 DIAGNOSIS — M25561 Pain in right knee: Secondary | ICD-10-CM | POA: Diagnosis not present

## 2020-02-16 DIAGNOSIS — G8929 Other chronic pain: Secondary | ICD-10-CM

## 2020-02-16 DIAGNOSIS — M25661 Stiffness of right knee, not elsewhere classified: Secondary | ICD-10-CM

## 2020-02-16 DIAGNOSIS — R2689 Other abnormalities of gait and mobility: Secondary | ICD-10-CM

## 2020-02-16 NOTE — Patient Instructions (Signed)
Access Code: [email or web address] URL: [email or web address] Date: 02/16/2020 Prepared by: Lieutenant Diego Exercises Sit to Stand with Arms Crossed - 1 x daily - 7 x weekly - 3 sets - 10 reps Standing Partial Lunge - 1 x daily - 7 x weekly - 3 sets - 10 reps Lateral Step Up with Counter Support - 1 x daily - 7 x weekly - 3 sets - 10 reps Forward Step Up - 1 x daily - 7 x weekly - 3 sets - 10 reps Single Leg Sit to Stand with Arms Crossed - 1 x daily - 7 x weekly - 3 sets - 10 reps Lateral Step Up with Counter Support - 1 x daily - 7 x weekly - 3 sets - 10 reps Forward Step Down with Heel Tap and Counter Support - 1 x daily - 7 x weekly - 3 sets - 10 reps

## 2020-02-16 NOTE — Therapy (Signed)
[facility] [address]. [place], [place], [address] Phone: [phone]   Fax:  [phone]  Physical Therapy Treatment  Patient Details  Name: Gina Chambers MRN: [id number] Date of Birth: November 14, 1940 Referring Provider (PT): Rosalia Hammers, DO   Encounter Date: 02/16/2020   PT End of Session - 02/16/20 1337    Visit Number 15    Number of Visits 20    Date for PT Re-Evaluation 03/01/20    Authorization - Visit Number 4    Authorization - Number of Visits 10    PT Start Time 1340    PT Stop Time 1430    PT Time Calculation (min) 50 min    Equipment Utilized During Treatment Gait belt    Activity Tolerance Patient limited by fatigue    Behavior During Therapy [facility] for tasks assessed/performed           Past Medical History:  Diagnosis Date  . Asthma   . Asthmatic bronchitis , chronic ([place])   . CHF (congestive heart failure) ([place])   . Diabetes mellitus without complication ([place])   . 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 last one approx 2013  . Family history of adverse reaction to anesthesia    Mother - temporary paralysis of 1 side  . GERD (gastroesophageal reflux disease)   . Hypertension   . Neuropathy    feet  . PONV (postoperative nausea and vomiting)    Pt reports violent vomiting with ANY pain meds given with anesthesia.  . Sleep apnea    CPAP  . Thyroid disease   . Vertigo    daily    Past Surgical History:  Procedure Laterality Date  . CARDIAC SURGERY    . CATARACT EXTRACTION W/PHACO Right 03/11/2017   Procedure: CATARACT EXTRACTION PHACO AND INTRAOCULAR LENS PLACEMENT ([place])  RIGHT DIABETIC;  Surgeon: Eulogio Bear, MD;  Location: [facility];  Service: Ophthalmology;  Laterality: Right;  Diabetic - insulin sleep apnea  . CATARACT EXTRACTION W/PHACO Left 04/02/2017   Procedure: CATARACT EXTRACTION PHACO AND INTRAOCULAR LENS PLACEMENT ([place]) LEFT DIABETES;  Surgeon: Eulogio Bear, MD;  Location: [place];  Service: Ophthalmology;  Laterality: Left;  Diabetic - insulin  . COLONOSCOPY WITH PROPOFOL N/A 11/09/2016   Procedure: COLONOSCOPY WITH PROPOFOL;  Surgeon: Jonathon Bellows, MD;  Location: [facility] ENDOSCOPY;  Service: Gastroenterology;  Laterality: N/A;  . HERNIA REPAIR    . VERTICAL BANDED GASTROPLASTY      There were no vitals filed for this visit.   Subjective Assessment - 02/16/20 1344    Subjective Pt. states that she was able to walked up the steps to the second floor twice since last appointment. Pt. states pain is feeling better today.    Pertinent History hx of blood clots, CABG, and diabetic neuropathy.    Limitations Sitting    How long can you sit comfortably? ~30 mins    Patient Stated Goals wants to return to walking without pain    Currently in Pain? No/denies    Pain Onset More than a month ago           There Ex:  Nustep L2-3 for 3 mins, then L0 for 7 mins  5XSTS retest: pt able to complete in 12 seconds  Floor transfer training: pt completed 2x floor transfers. Pt able to complete 1x without chair, 1x with use of chair with SBA. Pt required minimal verbal cueing  to complete  Lateral Step Ups staircase with railing support: pt at staircase instructed to complete step ups with improved glute recruitment and cued for proper knee positioning. Pt completed 2x stairs.  Forward Step Up: pt completed stairs approx 6x total. Pt able to demonstrate good technique with upright posture with UE support.   Single Leg Sit to Stand with Arms Crossed: pt from high raised table completing single leg stance with either leg approx 10-12 times each. Pt able to better balance with toe touch weightbearing with other leg when completing activity.   Forward Step Down with Heel Tap: pt initially tried from 6" step, unable to reach heel to floor without pain. Pt attempted from 3" step, able to complete with good form, pain in L hip. Pt instructed  to not tap heel all the way to ground, decreased hip pain noted. Pt instructed on how to complete these at home with 6" step by not reaching heel all the way down.                             PT Education - 02/16/20 1501    Education Details HEP    Person(s) Educated Patient    Methods Explanation;Demonstration;Tactile cues;Verbal cues;Handout    Comprehension Verbalized understanding;Returned demonstration               PT Long Term Goals - 02/16/20 1347      PT LONG TERM GOAL #1   Title Pt. will report being able to walk for .5 miles with no increase in pain above 2/10 to improve pain free functional mobility.    Baseline initial: pt unable to walk long distances wtihout increase in pain; 9/9: pt able to walk for about 0.25 mile; 9/30: pt able to walk for about 0.25 miles. 10/26: Pt states she can only walk 0.35 miles with 4-5/10 pain.    Time 4    Period Weeks    Status Partially Met    Target Date 02/16/20      PT LONG TERM GOAL #2   Title Pt. will be report being able to sit for more than 30 mins with no increase in pain above 2/10 to improve ability to do work related tasks.    Baseline initial: ~30 mins pt feels onset of R knee pain. 9/9: pt can sit for more than 30 mins with no increase in pain on R knee    Time 4    Period Weeks    Status Achieved    Target Date 12/31/19      PT LONG TERM GOAL #3   Title Pt. will improve 5XSTS to <12 sec to improve pain free functional mobility.    Baseline initial: 18.3 s; 9/9: 13.76 s 9/30: 13.44 s 10/26: 12 seconds    Time 4    Period Weeks    Status Achieved    Target Date 02/16/20      PT LONG TERM GOAL #4   Title Pt. will score 55 on FOTO to improve pain free functional mobility.    Baseline initial: 40; 9/9:43; 9/30: 43 10/26: 51    Time 4    Period Weeks    Status Partially Met    Target Date 02/16/20                 Plan - 02/16/20 1444    Clinical Impression Statement Pt. almost  ready for discharge, one more follow  up visit scheduled to ensure HEP compliance and no regression of sx. Pt. reassessed goals today. All goals were partially met, one goal acheived. Pt. is very close to achieving all goals and feels confident in her ability to continue progress at home. See updated goals for details. Pt. demonstrated improved ability to complete stairs and floor transfers today. Pt. has been instructed to call clinic if feeling well enough to cancel follow up appointment, or if she feels her symptoms are regressing. Pt. verbalized understanding and was agreeable to current plan of care.    Personal Factors and Comorbidities Comorbidity 2    Comorbidities hx of blood clots, diabetes    Stability/Clinical Decision Making Evolving/Moderate complexity    Clinical Decision Making Moderate    Rehab Potential Good    PT Frequency 1x / week    PT Duration 2 weeks    PT Treatment/Interventions ADLs/Self Care Home Management;Neuromuscular re-education;Patient/family education;Cryotherapy;Balance training;Therapeutic exercise;Therapeutic activities;Functional mobility training;Stair training;Gait training;Passive range of motion;Manual techniques;Vestibular    PT Next Visit Plan ankle and hip strategy    PT Home Exercise Plan [id number]    Consulted and Agree with Plan of Care Patient           Patient will benefit from skilled therapeutic intervention in order to improve the following deficits and impairments:  Abnormal gait, Decreased activity tolerance, Decreased balance, Decreased coordination, Impaired flexibility, Postural dysfunction, Impaired sensation, Hypomobility, Decreased strength, Difficulty walking, Decreased range of motion, Decreased endurance, Pain  Visit Diagnosis: Chronic pain of right knee  Muscle weakness (generalized)  Decreased range of motion of both lower extremities  Imbalance     Problem List Patient Active Problem List   Diagnosis Date Noted  .  Chest pain 12/24/2018  . Hypoglycemia 11/07/2017  . GIB (gastrointestinal bleeding) 11/07/2016  . COPD exacerbation ([place]) 05/12/2016  . Community acquired pneumonia 05/12/2016  . Leukocytosis 05/12/2016  . Generalized weakness 05/12/2016    Carlyle Basques, SPT 02/16/2020, 3:15 PM  [facility] [address] [place], [place], [address] Phone: [phone]   Fax:  [phone]  Name: Gina Chambers MRN: [id number] Date of Birth: May 10, 1940

## 2020-02-18 ENCOUNTER — Ambulatory Visit: Payer: Medicare Other | Admitting: Physical Therapy

## 2020-03-01 ENCOUNTER — Ambulatory Visit: Payer: POS | Attending: Sports Medicine | Admitting: Physical Therapy

## 2020-03-07 ENCOUNTER — Ambulatory Visit: Payer: POS | Admitting: Physical Therapy

## 2020-03-08 ENCOUNTER — Ambulatory Visit: Payer: POS | Admitting: Physical Therapy

## 2020-03-14 ENCOUNTER — Ambulatory Visit (INDEPENDENT_AMBULATORY_CARE_PROVIDER_SITE_OTHER): Payer: Medicare Other | Admitting: Internal Medicine

## 2020-03-14 VITALS — BP 133/72 | HR 82 | Temp 97.7°F | Resp 14 | Ht 64.0 in | Wt 193.0 lb

## 2020-03-14 DIAGNOSIS — Z7189 Other specified counseling: Secondary | ICD-10-CM

## 2020-03-14 DIAGNOSIS — Z9989 Dependence on other enabling machines and devices: Secondary | ICD-10-CM

## 2020-03-14 DIAGNOSIS — J441 Chronic obstructive pulmonary disease with (acute) exacerbation: Secondary | ICD-10-CM | POA: Diagnosis not present

## 2020-03-14 DIAGNOSIS — G2581 Restless legs syndrome: Secondary | ICD-10-CM | POA: Diagnosis not present

## 2020-03-14 DIAGNOSIS — G4733 Obstructive sleep apnea (adult) (pediatric): Secondary | ICD-10-CM | POA: Diagnosis not present

## 2020-03-14 NOTE — Progress Notes (Signed)
[facility] [place], [place] [address]  Pulmonary Sleep Medicine   Office Visit Note  Patient Name: Gina Chambers DOB: [birth year] MRN [id number]    Chief Complaint: Obstructive Sleep Apnea visit  Brief History:  Shani is seen today for initial consultation The patient has a 25 year history of sleep apnea. She recently received a replacement CPAP unit. Patient is using PAP nightly.  She goes to bed anytime from 11p.m.-2 a.m. and is in bed until 10 a.m. but she feels that she gets very little sleep. The patient has a history of restless leg syndrome. She has had difficulty with the medications she has used for it. She used Klonopin for a long time then she had a bad reaction. She also had a reaction to Sinemet. She is now taking gabapentin 100 mg. The RLS symptoms start in the evening. She takes one gabapentin at 8 and 2 at bedtime She is sleeping better with gabapentin but is still having issues. The patient feels more rested since getting her new PAP.  The patient reports benefiting from PAP use. Reported sleepiness is  improved and the Epworth Sleepiness Score is 6 out of 24. The patient occasionally take naps. The patient complains of the following: no CPAP complaints  The compliance download shows xcellent  compliance with an average use time of 7.4 hours. The AHI is 2.5  . ROS  General: (-) fever, (-) chills, (-) night sweat Nose and Sinuses: (-) nasal stuffiness or itchiness, (-) postnasal drip, (-) nosebleeds, (-) sinus trouble. Mouth and Throat: (-) sore throat, (-) hoarseness. Neck: (-) swollen glands, (-) enlarged thyroid, (-) neck pain. Respiratory: - cough, - shortness of breath, - wheezing. Neurologic: - numbness, - tingling. Psychiatric: - anxiety, - depression   Current Medication: Outpatient Encounter Medications as of 03/14/2020  Medication Sig Note  . diphenhydrAMINE (BENADRYL) 25 mg capsule Take by mouth.   . gabapentin (NEURONTIN) 100  MG capsule Take by mouth.   . levalbuterol (XOPENEX) 0.63 MG/3ML nebulizer solution Inhale into the lungs.   Marland Kitchen aspirin EC 81 MG tablet Take 81 mg by mouth daily.   . bumetanide (BUMEX) 0.5 MG tablet Take 0.5-1 mg by mouth See admin instructions. Take 2 tablets (1mg ) in the morning and 1 tablet (0.5 mg) in the evening   . Chromium Picolinate 200 MCG CAPS Take 200 mcg by mouth daily.   . fluticasone (FLOVENT HFA) 110 MCG/ACT inhaler Inhale 2 puffs into the lungs 2 (two) times daily.    . insulin glargine (LANTUS) 100 UNIT/ML injection Inject 10-20 Units into the skin See admin instructions. Inject 20u under the skin every morning and inject 10u under the skin every night   . insulin regular (NOVOLIN R,HUMULIN R) 250 units/2.38mL (100 units/mL) injection Inject 10 Units into the skin 4 (four) times daily.    . Ipratropium-Albuterol (COMBIVENT) 20-100 MCG/ACT AERS respimat Inhale 1 puff into the lungs 4 (four) times daily.    . lansoprazole (PREVACID) 30 MG capsule Take 30 mg by mouth 2 (two) times daily.   Marland Kitchen levothyroxine (SYNTHROID, LEVOTHROID) 125 MCG tablet Take 125 mcg by mouth daily.   . Magnesium 500 MG TABS Take 500 mg by mouth 4 (four) times daily.    . metoprolol succinate (TOPROL-XL) 50 MG 24 hr tablet Take 50 mg by mouth daily. Take with or immediately following a meal.   . montelukast (SINGULAIR) 10 MG tablet Take 10 mg by mouth at bedtime.   Marland Kitchen  Multiple Vitamins-Minerals (PRESERVISION AREDS 2 PO) Take 1 tablet by mouth 2 (two) times daily.   Marland Kitchen MYRBETRIQ 25 MG TB24 tablet Take 25 mg by mouth daily.   Marland Kitchen tolterodine (DETROL LA) 4 MG 24 hr capsule Take 4 mg by mouth daily.   Alveda Reasons 20 MG TABS tablet Take 20 mg by mouth daily.   . [DISCONTINUED] carbidopa-levodopa (SINEMET IR) 25-100 MG tablet Take 1 tablet by mouth 3 (three) times daily.   . [DISCONTINUED] warfarin (COUMADIN) 5 MG tablet Take 5 mg by mouth every evening.  12/24/2018: Patient not entirely sure she took a dose last night   No  facility-administered encounter medications on file as of 03/14/2020.    Surgical History: Past Surgical History:  Procedure Laterality Date  . CARDIAC SURGERY    . CATARACT EXTRACTION W/PHACO Right 03/11/2017   Procedure: CATARACT EXTRACTION PHACO AND INTRAOCULAR LENS PLACEMENT ([place])  RIGHT DIABETIC;  Surgeon: Eulogio Bear, MD;  Location: [place];  Service: Ophthalmology;  Laterality: Right;  Diabetic - insulin sleep apnea  . CATARACT EXTRACTION W/PHACO Left 04/02/2017   Procedure: CATARACT EXTRACTION PHACO AND INTRAOCULAR LENS PLACEMENT ([place]) LEFT DIABETES;  Surgeon: Eulogio Bear, MD;  Location: [place];  Service: Ophthalmology;  Laterality: Left;  Diabetic - insulin  . COLONOSCOPY WITH PROPOFOL N/A 11/09/2016   Procedure: COLONOSCOPY WITH PROPOFOL;  Surgeon: Jonathon Bellows, MD;  Location: [facility] ENDOSCOPY;  Service: Gastroenterology;  Laterality: N/A;  . HERNIA REPAIR    . VERTICAL BANDED GASTROPLASTY      Medical History: Past Medical History:  Diagnosis Date  . Asthma   . Asthmatic bronchitis , chronic ([place])   . CHF (congestive heart failure) ([place])   . Diabetes mellitus without complication ([place])   . 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 last one approx 2013  . Family history of adverse reaction to anesthesia    Mother - temporary paralysis of 1 side  . GERD (gastroesophageal reflux disease)   . Hypertension   . Neuropathy    feet  . PONV (postoperative nausea and vomiting)    Pt reports violent vomiting with ANY pain meds given with anesthesia.  . Sleep apnea    CPAP  . Thyroid disease   . Vertigo    daily    Family History: Non contributory to the present illness  Social History: Social History   Socioeconomic History  . Marital status: Single    Spouse name: Not on file  . Number of children: Not on file  . Years of education: Not on file  . Highest education level: Not on file  Occupational History  . Not on  file  Tobacco Use  . Smoking status: Never Smoker  . Smokeless tobacco: Never Used  Vaping Use  . Vaping Use: Never used  Substance and Sexual Activity  . Alcohol use: No  . Drug use: No  . Sexual activity: Not on file  Other Topics Concern  . Not on file  Social History Narrative  . Not on file   Social Determinants of Health   Financial Resource Strain:   . Difficulty of Paying Living Expenses: Not on file  Food Insecurity:   . Worried About Charity fundraiser in the Last Year: Not on file  . Ran Out of Food in the Last Year: Not on file  Transportation Needs:   . Lack of Transportation (Medical): Not on file  . Lack of Transportation (  Non-Medical): Not on file  Physical Activity:   . Days of Exercise per Week: Not on file  . Minutes of Exercise per Session: Not on file  Stress:   . Feeling of Stress : Not on file  Social Connections:   . Frequency of Communication with Friends and Family: Not on file  . Frequency of Social Gatherings with Friends and Family: Not on file  . Attends Religious Services: Not on file  . Active Member of Clubs or Organizations: Not on file  . Attends Archivist Meetings: Not on file  . Marital Status: Not on file  Intimate Partner Violence:   . Fear of Current or Ex-Partner: Not on file  . Emotionally Abused: Not on file  . Physically Abused: Not on file  . Sexually Abused: Not on file    Vital Signs: Blood pressure 133/72, pulse 82, temperature 97.7 F (36.5 C), temperature source Temporal, resp. rate 14, height 5\' 4"  (1.626 m), weight 193 lb (87.5 kg), SpO2 99 %.  Examination: General Appearance: The patient is well-developed, well-nourished, and in no distress. Neck Circumference:  Skin: Gross inspection of skin unremarkable. Head: normocephalic, no gross deformities. Eyes: no gross deformities noted. ENT: ears appear grossly normal Neurologic: Alert and oriented. No involuntary movements.    EPWORTH SLEEPINESS  SCALE:  Scale:  (0)= no chance of dozing; (1)= slight chance of dozing; (2)= moderate chance of dozing; (3)= high chance of dozing  Chance  Situtation    Sitting and reading: 1    Watching TV: 1    Sitting Inactive in public: 0    As a passenger in car: 1      Lying down to rest: 3    Sitting and talking: 0    Sitting quielty after lunch: 0    In a car, stopped in traffic: 0   TOTAL SCORE:   6 out of 24    SLEEP STUDIES:  1. PSG 4.8.96  -  RDI 7.0,  lowest saturation 90%   CPAP COMPLIANCE DATA:  Date Range: 02/09/20 - 03/09/20  Average Daily Use: 7:22 hours  Median Use: 7:47  Compliance for > 4 Hours: 93%  AHI: 2.5 respiratory events per hour  Days Used: 30/30  Mask Leak: 25.6 L/min    95th Percentile Pressure: 6.0 cmH2O         LABS: No results found for this or any previous visit (from the past 2160 hour(s)).  Radiology: DG Chest 2 View  Result Date: 12/24/2018 CLINICAL DATA:  New onset chest pain extending into the left upper extremity. EXAM: CHEST - 2 VIEW COMPARISON:  One-view chest x-ray 11/06/2017 FINDINGS: The patient is status post median sternotomy for CABG, aortic valve replacement, and mitral annular repair. Heart size is normal. There is no edema or effusion. No focal airspace disease is present. Metallic wires are present over the anterior surface of the chest. IMPRESSION: 1. Status post cardiac surgery. 2. No acute cardiopulmonary disease. Electronically Signed   By: San Morelle M.D.   On: 12/24/2018 05:33   NM Myocar Multi W/Spect W/Wall Motion / EF  Result Date: 01/07/2019  There was no ST segment deviation noted during stress.  No T wave inversion was noted during stress.  The study is normal.  This is a low risk study.  The left ventricular ejection fraction is normal (55-65%).  Nuclear stress EF: 55%.  Conclusion normal overall left ventricular function Ejection fraction of 55%   ECHOCARDIOGRAM COMPLETE  Result  Date: 12/25/2018   ECHOCARDIOGRAM REPORT   Patient Name:   ERISHA PAUGH Date of Exam: 12/24/2018 Medical Rec #:  [id number]     Height:       63.0 in Accession #:    [id number]    Weight:       186.0 lb Date of Birth:  07/24/1940    BSA:          1.87 m Patient Age:    79 years      BP:           130/60 mmHg Patient Gender: F             HR:           84 bpm. Exam Location:  [facility]  Procedure: 2D Echo, Cardiac Doppler and Color Doppler Indications:     I35.9 Aortic valve disorder  History:         Patient has prior history of Echocardiogram examinations, most                  recent 05/13/2016. Aortic Valve Replacement, Mitral valve                  replacement Risk Factors: Hypertension and Diabetes. 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 Sleep apnea. Thyroid                  disease. Congestive heart failure.  Sonographer:     Wilford Sports Rodgers-Jones Referring Phys:  [id number] [place] Diagnosing Phys: Bartholome Bill MD IMPRESSIONS  1. The left ventricle has normal systolic function with an ejection fraction of 60-65%. The cavity size was normal. There is moderately increased left ventricular wall thickness. Left ventricular diastolic Doppler parameters are consistent with impaired  relaxation.  2. The right ventricle has normal systolic function. The cavity was normal. There is no increase in right ventricular wall thickness.  3. Left atrial size was mildly dilated.  4. The mitral valve was not well visualized. Moderate thickening of the mitral valve leaflet. Mild-moderate mitral valve stenosis.  5. The tricuspid valve is grossly normal.  6. A bioprosthesis valve is present in the aortic position.  7. The aortic valve was not well visualized. Aortic valve regurgitation is mild to moderate by color flow Doppler.  8. The aorta is normal unless otherwise noted.  9. The interatrial septum was not assessed. FINDINGS  Left Ventricle: The left ventricle has normal systolic function, with an  ejection fraction of 60-65%. The cavity size was normal. There is moderately increased left ventricular wall thickness. Left ventricular diastolic Doppler parameters are consistent  with impaired relaxation. Right Ventricle: The right ventricle has normal systolic function. The cavity was normal. There is no increase in right ventricular wall thickness. Left Atrium: Left atrial size was mildly dilated. Right Atrium: Right atrial size was normal in size. Right atrial pressure is estimated at 10 mmHg. Interatrial Septum: The interatrial septum was not assessed. Pericardium: There is no evidence of pericardial effusion. Mitral Valve: The mitral valve was not well visualized. Moderate thickening of the mitral valve leaflet. Mitral valve regurgitation is trivial by color flow Doppler. Mild-moderate mitral valve stenosis. Tricuspid Valve: The tricuspid valve is grossly normal. Tricuspid valve regurgitation is trivial by color flow Doppler. Aortic Valve: The aortic valve was not well visualized Aortic valve regurgitation is mild to moderate by color flow Doppler.  A bioprosthetic aortic valve valve is present in the aortic position. Pulmonic Valve: The pulmonic valve was not well visualized. Pulmonic valve regurgitation is trivial by color flow Doppler. Aorta: The aorta is normal unless otherwise noted.  +--------------+-------++ LEFT VENTRICLE        +----------------+---------++ +--------------+-------++ Diastology                PLAX 2D               +----------------+---------++ +--------------+-------++ LV e' lateral:  7.29 cm/s LVIDd:        4.41 cm +----------------+---------++ +--------------+-------++ LV E/e' lateral:23.7      LVIDs:        2.21 cm +----------------+---------++ +--------------+-------++ LV e' medial:   5.77 cm/s LV PW:        0.80 cm +----------------+---------++ +--------------+-------++ LV E/e' medial: 30.0      LV IVS:       0.78 cm  +----------------+---------++ +--------------+-------++ LV SV:        72 ml   +--------------+-------++ LV SV Index:  36.42   +--------------+-------++                       +--------------+-------++ +---------------+----------++ RIGHT VENTRICLE           +---------------+----------++ RV Basal diam: 3.86 cm    +---------------+----------++ RV S prime:    11.60 cm/s +---------------+----------++ TAPSE (M-mode):1.9 cm     +---------------+----------++ +---------------+-------++-----------++ LEFT ATRIUM           Index       +---------------+-------++-----------++ LA diam:       5.60 cm2.99 cm/m  +---------------+-------++-----------++ LA Vol (A2C):  55.4 ml29.55 ml/m +---------------+-------++-----------++ LA Vol (A4C):  54.8 ml29.23 ml/m +---------------+-------++-----------++ LA Biplane Vol:55.4 ml29.55 ml/m +---------------+-------++-----------++ +------------+---------++-----------++ RIGHT ATRIUM         Index       +------------+---------++-----------++ RA Area:    11.80 cm            +------------+---------++-----------++ RA Volume:  27.10 ml 14.45 ml/m +------------+---------++-----------++  +------------------+------------++ AORTIC VALVE                   +------------------+------------++ AV Vmax:          173.00 cm/s  +------------------+------------++ AV Vmean:         123.800 cm/s +------------------+------------++ AV VTI:           0.351 m      +------------------+------------++ AV Peak Grad:     12.0 mmHg    +------------------+------------++ AV Mean Grad:     7.2 mmHg     +------------------+------------++ LVOT Vmax:        106.50 cm/s  +------------------+------------++ LVOT Vmean:       77.150 cm/s  +------------------+------------++ LVOT VTI:         0.248 m      +------------------+------------++ LVOT/AV VTI ratio:0.71         +------------------+------------++   +-------------+-------++ AORTA                +-------------+-------++ Ao Root diam:2.50 cm +-------------+-------++ +--------------+----------++  +---------------+-----------++ MITRAL VALVE              TRICUSPID VALVE            +--------------+----------++  +---------------+-----------++ MV Area (PHT):3.42 cm    TR Peak grad:  21.2 mmHg   +--------------+----------++  +---------------+-----------++ MV Peak grad: 15.4 mmHg   TR Vmax:       230.00 cm/s +--------------+----------++  +---------------+-----------++  MV Mean grad: 7.5 mmHg   +--------------+----------++  +-------------+------+ MV Vmax:      1.96 m/s    SHUNTS              +--------------+----------++  +-------------+------+ MV Vmean:     130.2 cm/s  Systemic VTI:0.25 m +--------------+----------++  +-------------+------+ MV VTI:       0.47 m     +--------------+----------++ MV PHT:       64.38 msec +--------------+----------++ MV Decel Time:222 msec   +--------------+----------++ +--------------+-----------++ MV E velocity:173.00 cm/s +--------------+-----------++ MV A velocity:120.00 cm/s +--------------+-----------++ MV E/A ratio: 1.44        +--------------+-----------++  Bartholome Bill MD Electronically signed by Bartholome Bill MD Signature Date/Time: 12/25/2018/9:41:40 AM    Final     No results found.  No results found.    Assessment and Plan: Patient Active Problem List   Diagnosis Date Noted  . Chest pain 12/24/2018  . Hypoglycemia 11/07/2017  . GIB (gastrointestinal bleeding) 11/07/2016  . 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 COPD exacerbation ([place]) 05/12/2016  . Community acquired pneumonia 05/12/2016  . Leukocytosis 05/12/2016  . Generalized weakness 05/12/2016  . Acquired hypothyroidism 12/15/2015  . HTN, goal below 140/90 12/15/2015      The patient does tolerate PAP and reports significant benefit  from PAP use. The patient was reminded how to clean the CPAP She was advised to take her gabapentin at 6-7 p.m. and then 90 min before bed. She was urged to follow up with her prescribing physician to increase her dosage. She should also check on her blood work to see if her iron is normal and the ferritin at least 75 ng/ml The compliance is excellent. The apnea is controlled.   1. OSA- continue excellent compliance 2. RLS - recently started on Gabapentin. Has had some relief,  however, continues to have some mild symptoms. Pt to followup with PCP.  3. COPD - followed by Pulmonologist. CPAP counseling provded  General Counseling: I have discussed the findings of the evaluation and examination with Charlett Nose.  I have also discussed any further diagnostic evaluation thatmay be needed or ordered today. Kima verbalizes understanding of the findings of todays visit. We also reviewed her medications today and discussed drug interactions and side effects including but not limited excessive drowsiness and altered mental states. We also discussed that there is always a risk not just to her but also people around her. she has been encouraged to call the office with any questions or concerns that should arise related to todays visit.  No orders of the defined types were placed in this encounter.   This patient was seen by Shannan Harper, AGNP-C in collaboration with Dr. Devona Konig as a part of collaborative care agreement.     I have personally obtained a history, examined the patient, evaluated laboratory and imaging results, formulated the assessment and plan and placed orders.   Richelle Ito Saunders Glance, PhD, FAASM  Diplomate, American Board of Sleep Medicine    Allyne Gee, MD [facility] Diplomate ABMS Pulmonary and Critical Care Medicine Sleep medicine

## 2020-03-14 NOTE — Patient Instructions (Signed)

## 2020-03-29 ENCOUNTER — Ambulatory Visit: Payer: POS | Admitting: Physical Therapy

## 2020-03-31 ENCOUNTER — Encounter: Payer: POS | Admitting: Physical Therapy

## 2020-03-31 ENCOUNTER — Ambulatory Visit: Payer: POS | Admitting: Physical Therapy

## 2020-04-05 ENCOUNTER — Encounter: Payer: POS | Admitting: Physical Therapy

## 2020-04-07 ENCOUNTER — Encounter: Payer: POS | Admitting: Physical Therapy

## 2020-04-12 ENCOUNTER — Encounter: Payer: POS | Admitting: Physical Therapy

## 2020-04-14 ENCOUNTER — Encounter: Payer: POS | Admitting: Physical Therapy

## 2020-04-19 ENCOUNTER — Encounter: Payer: POS | Admitting: Physical Therapy

## 2020-04-21 ENCOUNTER — Encounter: Payer: POS | Admitting: Physical Therapy

## 2020-05-11 ENCOUNTER — Other Ambulatory Visit: Payer: Self-pay | Admitting: Physical Medicine & Rehabilitation

## 2020-05-11 DIAGNOSIS — M6281 Muscle weakness (generalized): Secondary | ICD-10-CM

## 2020-05-12 ENCOUNTER — Other Ambulatory Visit: Payer: Self-pay

## 2020-05-12 ENCOUNTER — Ambulatory Visit
Admission: RE | Admit: 2020-05-12 | Discharge: 2020-05-12 | Disposition: A | Discharge status: Home / Self Care | Payer: Medicare Other | Source: Ambulatory Visit | Attending: Physical Medicine & Rehabilitation | Admitting: Physical Medicine & Rehabilitation

## 2020-05-12 DIAGNOSIS — M6281 Muscle weakness (generalized): Secondary | ICD-10-CM | POA: Insufficient documentation

## 2020-09-23 ENCOUNTER — Emergency Department: Payer: Medicare Other

## 2020-09-23 ENCOUNTER — Other Ambulatory Visit: Payer: Self-pay

## 2020-09-23 ENCOUNTER — Inpatient Hospital Stay
Admission: EM | Admit: 2020-09-23 | Discharge: 2020-09-27 | DRG: 444 | Disposition: A | Discharge status: Home / Self Care | Payer: Medicare Other | Attending: Internal Medicine | Admitting: Internal Medicine

## 2020-09-23 DIAGNOSIS — Z8616 Personal history of COVID-19: Secondary | ICD-10-CM | POA: Diagnosis not present

## 2020-09-23 DIAGNOSIS — E119 Type 2 diabetes mellitus without complications: Secondary | ICD-10-CM | POA: Diagnosis not present

## 2020-09-23 DIAGNOSIS — E860 Dehydration: Secondary | ICD-10-CM | POA: Diagnosis present

## 2020-09-23 DIAGNOSIS — E1142 Type 2 diabetes mellitus with diabetic polyneuropathy: Secondary | ICD-10-CM | POA: Diagnosis present

## 2020-09-23 DIAGNOSIS — U071 COVID-19: Secondary | ICD-10-CM | POA: Diagnosis present

## 2020-09-23 DIAGNOSIS — K219 Gastro-esophageal reflux disease without esophagitis: Secondary | ICD-10-CM | POA: Diagnosis present

## 2020-09-23 DIAGNOSIS — Z7982 Long term (current) use of aspirin: Secondary | ICD-10-CM

## 2020-09-23 DIAGNOSIS — K810 Acute cholecystitis: Principal | ICD-10-CM | POA: Diagnosis present

## 2020-09-23 DIAGNOSIS — K819 Cholecystitis, unspecified: Secondary | ICD-10-CM | POA: Diagnosis not present

## 2020-09-23 DIAGNOSIS — Z885 Allergy status to narcotic agent status: Secondary | ICD-10-CM

## 2020-09-23 DIAGNOSIS — Z6834 Body mass index (BMI) 34.0-34.9, adult: Secondary | ICD-10-CM

## 2020-09-23 DIAGNOSIS — Z91041 Radiographic dye allergy status: Secondary | ICD-10-CM | POA: Diagnosis not present

## 2020-09-23 DIAGNOSIS — I5030 Unspecified diastolic (congestive) heart failure: Secondary | ICD-10-CM | POA: Diagnosis present

## 2020-09-23 DIAGNOSIS — L02214 Cutaneous abscess of groin: Secondary | ICD-10-CM | POA: Diagnosis present

## 2020-09-23 DIAGNOSIS — K8020 Calculus of gallbladder without cholecystitis without obstruction: Secondary | ICD-10-CM

## 2020-09-23 DIAGNOSIS — Z794 Long term (current) use of insulin: Secondary | ICD-10-CM | POA: Diagnosis not present

## 2020-09-23 DIAGNOSIS — Z882 Allergy status to sulfonamides status: Secondary | ICD-10-CM | POA: Diagnosis not present

## 2020-09-23 DIAGNOSIS — R194 Change in bowel habit: Secondary | ICD-10-CM | POA: Diagnosis not present

## 2020-09-23 DIAGNOSIS — E878 Other disorders of electrolyte and fluid balance, not elsewhere classified: Secondary | ICD-10-CM | POA: Diagnosis present

## 2020-09-23 DIAGNOSIS — E1165 Type 2 diabetes mellitus with hyperglycemia: Secondary | ICD-10-CM | POA: Diagnosis present

## 2020-09-23 DIAGNOSIS — R0602 Shortness of breath: Secondary | ICD-10-CM

## 2020-09-23 DIAGNOSIS — K8000 Calculus of gallbladder with acute cholecystitis without obstruction: Secondary | ICD-10-CM

## 2020-09-23 DIAGNOSIS — Z79899 Other long term (current) drug therapy: Secondary | ICD-10-CM

## 2020-09-23 DIAGNOSIS — Z881 Allergy status to other antibiotic agents status: Secondary | ICD-10-CM

## 2020-09-23 DIAGNOSIS — R197 Diarrhea, unspecified: Secondary | ICD-10-CM | POA: Diagnosis not present

## 2020-09-23 DIAGNOSIS — R932 Abnormal findings on diagnostic imaging of liver and biliary tract: Secondary | ICD-10-CM

## 2020-09-23 DIAGNOSIS — E039 Hypothyroidism, unspecified: Secondary | ICD-10-CM | POA: Diagnosis present

## 2020-09-23 DIAGNOSIS — I110 Hypertensive heart disease with heart failure: Secondary | ICD-10-CM | POA: Diagnosis present

## 2020-09-23 DIAGNOSIS — E871 Hypo-osmolality and hyponatremia: Secondary | ICD-10-CM | POA: Diagnosis present

## 2020-09-23 DIAGNOSIS — I10 Essential (primary) hypertension: Secondary | ICD-10-CM | POA: Diagnosis present

## 2020-09-23 DIAGNOSIS — I5031 Acute diastolic (congestive) heart failure: Secondary | ICD-10-CM | POA: Diagnosis not present

## 2020-09-23 DIAGNOSIS — Z7901 Long term (current) use of anticoagulants: Secondary | ICD-10-CM | POA: Diagnosis not present

## 2020-09-23 DIAGNOSIS — G4730 Sleep apnea, unspecified: Secondary | ICD-10-CM | POA: Diagnosis present

## 2020-09-23 DIAGNOSIS — L02224 Furuncle of groin: Secondary | ICD-10-CM | POA: Diagnosis not present

## 2020-09-23 DIAGNOSIS — Z79890 Hormone replacement therapy (postmenopausal): Secondary | ICD-10-CM

## 2020-09-23 DIAGNOSIS — J441 Chronic obstructive pulmonary disease with (acute) exacerbation: Secondary | ICD-10-CM | POA: Diagnosis present

## 2020-09-23 DIAGNOSIS — I5033 Acute on chronic diastolic (congestive) heart failure: Secondary | ICD-10-CM | POA: Diagnosis present

## 2020-09-23 DIAGNOSIS — E669 Obesity, unspecified: Secondary | ICD-10-CM | POA: Diagnosis present

## 2020-09-23 DIAGNOSIS — R109 Unspecified abdominal pain: Secondary | ICD-10-CM

## 2020-09-23 DIAGNOSIS — Z888 Allergy status to other drugs, medicaments and biological substances status: Secondary | ICD-10-CM | POA: Diagnosis not present

## 2020-09-23 DIAGNOSIS — E875 Hyperkalemia: Secondary | ICD-10-CM | POA: Diagnosis not present

## 2020-09-23 DIAGNOSIS — Z803 Family history of malignant neoplasm of breast: Secondary | ICD-10-CM

## 2020-09-23 DIAGNOSIS — D72829 Elevated white blood cell count, unspecified: Secondary | ICD-10-CM | POA: Diagnosis not present

## 2020-09-23 DIAGNOSIS — Z86718 Personal history of other venous thrombosis and embolism: Secondary | ICD-10-CM | POA: Diagnosis not present

## 2020-09-23 DIAGNOSIS — R21 Rash and other nonspecific skin eruption: Secondary | ICD-10-CM | POA: Diagnosis present

## 2020-09-23 LAB — URINALYSIS, COMPLETE (UACMP) WITH MICROSCOPIC
Bilirubin Urine: NEGATIVE
Glucose, UA: NEGATIVE mg/dL
Ketones, ur: NEGATIVE mg/dL
Leukocytes,Ua: NEGATIVE
Nitrite: POSITIVE — AB
Protein, ur: NEGATIVE mg/dL
Specific Gravity, Urine: 1.005 (ref 1.005–1.030)
pH: 5 (ref 5.0–8.0)

## 2020-09-23 LAB — CBC WITH DIFFERENTIAL/PLATELET
Abs Immature Granulocytes: 0.96 10*3/uL — ABNORMAL HIGH (ref 0.00–0.07)
Basophils Absolute: 0.1 10*3/uL (ref 0.0–0.1)
Basophils Relative: 0 %
Eosinophils Absolute: 0.1 10*3/uL (ref 0.0–0.5)
Eosinophils Relative: 0 %
HCT: 28.1 % — ABNORMAL LOW (ref 36.0–46.0)
Hemoglobin: 9.5 g/dL — ABNORMAL LOW (ref 12.0–15.0)
Immature Granulocytes: 3 %
Lymphocytes Relative: 29 %
Lymphs Abs: 8.2 10*3/uL — ABNORMAL HIGH (ref 0.7–4.0)
MCH: 29.7 pg (ref 26.0–34.0)
MCHC: 33.8 g/dL (ref 30.0–36.0)
MCV: 87.8 fL (ref 80.0–100.0)
Monocytes Absolute: 1.6 10*3/uL — ABNORMAL HIGH (ref 0.1–1.0)
Monocytes Relative: 6 %
Neutro Abs: 17.3 10*3/uL — ABNORMAL HIGH (ref 1.7–7.7)
Neutrophils Relative %: 62 %
Platelets: 266 10*3/uL (ref 150–400)
RBC: 3.2 MIL/uL — ABNORMAL LOW (ref 3.87–5.11)
RDW: 14.4 % (ref 11.5–15.5)
Smear Review: NORMAL
WBC: 28.3 10*3/uL — ABNORMAL HIGH (ref 4.0–10.5)
nRBC: 0 % (ref 0.0–0.2)

## 2020-09-23 LAB — COMPREHENSIVE METABOLIC PANEL
ALT: 39 U/L (ref 0–44)
AST: 72 U/L — ABNORMAL HIGH (ref 15–41)
Albumin: 2.7 g/dL — ABNORMAL LOW (ref 3.5–5.0)
Alkaline Phosphatase: 106 U/L (ref 38–126)
Anion gap: 10 (ref 5–15)
BUN: 16 mg/dL (ref 8–23)
CO2: 25 mmol/L (ref 22–32)
Calcium: 8.2 mg/dL — ABNORMAL LOW (ref 8.9–10.3)
Chloride: 90 mmol/L — ABNORMAL LOW (ref 98–111)
Creatinine, Ser: 0.93 mg/dL (ref 0.44–1.00)
GFR, Estimated: >60 mL/min (ref >60)
Glucose, Bld: 152 mg/dL — ABNORMAL HIGH (ref 70–99)
Potassium: 3.9 mmol/L (ref 3.5–5.1)
Sodium: 125 mmol/L — ABNORMAL LOW (ref 135–145)
Total Bilirubin: 1.3 mg/dL — ABNORMAL HIGH (ref 0.3–1.2)
Total Protein: 5.8 g/dL — ABNORMAL LOW (ref 6.5–8.1)

## 2020-09-23 LAB — RESP PANEL BY RT-PCR (FLU A&B, COVID) ARPGX2
Influenza A by PCR: NEGATIVE
Influenza B by PCR: NEGATIVE
SARS Coronavirus 2 by RT PCR: POSITIVE — AB

## 2020-09-23 LAB — GLUCOSE, CAPILLARY: Glucose-Capillary: 144 mg/dL — ABNORMAL HIGH (ref 70–99)

## 2020-09-23 LAB — LACTIC ACID, PLASMA: Lactic Acid, Venous: 1 mmol/L (ref 0.5–1.9)

## 2020-09-23 MED ORDER — ONDANSETRON HCL 4 MG/2ML IJ SOLN
4.0000 mg | Freq: Four times a day (QID) | INTRAMUSCULAR | Status: DC | PRN
  Filled 2020-09-23: qty 2

## 2020-09-23 MED ORDER — LEVALBUTEROL HCL 0.63 MG/3ML IN NEBU
0.6300 mg | INHALATION_SOLUTION | Freq: Four times a day (QID) | RESPIRATORY_TRACT | Status: DC | PRN
  Filled 2020-09-23: qty 3

## 2020-09-23 MED ORDER — BUMETANIDE 0.5 MG PO TABS: 0.5000 mg | ORAL_TABLET | ORAL | Status: DC

## 2020-09-23 MED ORDER — PIPERACILLIN-TAZOBACTAM 3.375 G IVPB
3.3750 g | Freq: Three times a day (TID) | INTRAVENOUS | Status: DC
  Administered 2020-09-24 – 2020-09-27 (×10): 3.375 g via INTRAVENOUS
  Filled 2020-09-23 (×9): qty 50

## 2020-09-23 MED ORDER — MAGNESIUM OXIDE -MG SUPPLEMENT 400 (240 MG) MG PO TABS
400.0000 mg | ORAL_TABLET | Freq: Four times a day (QID) | ORAL | Status: DC
  Administered 2020-09-23 – 2020-09-27 (×10): 400 mg via ORAL
  Filled 2020-09-23 (×11): qty 1

## 2020-09-23 MED ORDER — BUMETANIDE 0.5 MG PO TABS
0.5000 mg | ORAL_TABLET | Freq: Every evening | ORAL | Status: DC
  Administered 2020-09-23 – 2020-09-25 (×3): 0.5 mg via ORAL
  Filled 2020-09-23 (×4): qty 1

## 2020-09-23 MED ORDER — MONTELUKAST SODIUM 10 MG PO TABS
10.0000 mg | ORAL_TABLET | Freq: Every day | ORAL | Status: DC
  Administered 2020-09-23 – 2020-09-26 (×4): 10 mg via ORAL
  Filled 2020-09-23 (×5): qty 1

## 2020-09-23 MED ORDER — FLUTICASONE PROPIONATE HFA 110 MCG/ACT IN AERO
2.0000 | INHALATION_SPRAY | Freq: Two times a day (BID) | RESPIRATORY_TRACT | Status: DC
  Administered 2020-09-23 – 2020-09-27 (×8): 2 via RESPIRATORY_TRACT
  Filled 2020-09-23 (×2): qty 12

## 2020-09-23 MED ORDER — GABAPENTIN 100 MG PO CAPS
200.0000 mg | ORAL_CAPSULE | Freq: Every day | ORAL | Status: DC
  Administered 2020-09-24 – 2020-09-26 (×3): 200 mg via ORAL
  Filled 2020-09-23 (×4): qty 2

## 2020-09-23 MED ORDER — PIPERACILLIN-TAZOBACTAM 3.375 G IVPB 30 MIN
3.3750 g | Freq: Once | INTRAVENOUS | Status: AC
  Administered 2020-09-23: 3.375 g via INTRAVENOUS
  Filled 2020-09-23: qty 50

## 2020-09-23 MED ORDER — TRIAMCINOLONE ACETONIDE 0.1 % EX CREA
TOPICAL_CREAM | Freq: Three times a day (TID) | CUTANEOUS | Status: DC
  Filled 2020-09-23: qty 15

## 2020-09-23 MED ORDER — HYDROMORPHONE HCL 1 MG/ML IJ SOLN: 0.5000 mg | INTRAMUSCULAR | Status: DC | PRN

## 2020-09-23 MED ORDER — MIRABEGRON ER 25 MG PO TB24
25.0000 mg | ORAL_TABLET | Freq: Every day | ORAL | Status: DC
  Administered 2020-09-24 – 2020-09-27 (×2): 25 mg via ORAL
  Filled 2020-09-23 (×4): qty 1

## 2020-09-23 MED ORDER — SIMETHICONE 80 MG PO CHEW
40.0000 mg | CHEWABLE_TABLET | Freq: Four times a day (QID) | ORAL | Status: DC | PRN
  Filled 2020-09-23: qty 1

## 2020-09-23 MED ORDER — BUMETANIDE 1 MG PO TABS
1.0000 mg | ORAL_TABLET | Freq: Every morning | ORAL | Status: DC
  Filled 2020-09-23 (×3): qty 1

## 2020-09-23 MED ORDER — IPRATROPIUM-ALBUTEROL 20-100 MCG/ACT IN AERS
1.0000 | INHALATION_SPRAY | Freq: Four times a day (QID) | RESPIRATORY_TRACT | Status: DC | PRN
  Administered 2020-09-24: 1 via RESPIRATORY_TRACT
  Filled 2020-09-23 (×2): qty 4

## 2020-09-23 MED ORDER — LEVALBUTEROL TARTRATE 45 MCG/ACT IN AERO
1.0000 | INHALATION_SPRAY | Freq: Four times a day (QID) | RESPIRATORY_TRACT | Status: DC | PRN
  Administered 2020-09-23: 2 via RESPIRATORY_TRACT
  Filled 2020-09-23: qty 15

## 2020-09-23 MED ORDER — LACTATED RINGERS IV BOLUS
1000.0000 mL | Freq: Once | INTRAVENOUS | Status: AC
  Administered 2020-09-23: 1000 mL via INTRAVENOUS

## 2020-09-23 MED ORDER — LEVOTHYROXINE SODIUM 50 MCG PO TABS
125.0000 ug | ORAL_TABLET | Freq: Every day | ORAL | Status: DC
  Administered 2020-09-24 – 2020-09-27 (×3): 125 ug via ORAL
  Filled 2020-09-23 (×3): qty 1

## 2020-09-23 MED ORDER — OCUVITE-LUTEIN PO CAPS
1.0000 | ORAL_CAPSULE | Freq: Two times a day (BID) | ORAL | Status: DC
  Administered 2020-09-24 – 2020-09-27 (×5): 1 via ORAL
  Filled 2020-09-23 (×8): qty 1

## 2020-09-23 MED ORDER — INSULIN ASPART 100 UNIT/ML IJ SOLN
0.0000 [IU] | Freq: Three times a day (TID) | INTRAMUSCULAR | Status: DC
  Administered 2020-09-24: 3 [IU] via SUBCUTANEOUS
  Administered 2020-09-24: 2 [IU] via SUBCUTANEOUS
  Administered 2020-09-24: 8 [IU] via SUBCUTANEOUS
  Administered 2020-09-24 – 2020-09-25 (×2): 2 [IU] via SUBCUTANEOUS
  Administered 2020-09-25 – 2020-09-26 (×3): 3 [IU] via SUBCUTANEOUS
  Administered 2020-09-26 – 2020-09-27 (×3): 2 [IU] via SUBCUTANEOUS
  Filled 2020-09-23 (×10): qty 1

## 2020-09-23 MED ORDER — LACTATED RINGERS IV SOLN: INTRAVENOUS | Status: DC

## 2020-09-23 MED ORDER — SODIUM CHLORIDE 0.9 % IV SOLN: INTRAVENOUS | Status: DC

## 2020-09-23 MED ORDER — PANTOPRAZOLE SODIUM 40 MG PO TBEC
40.0000 mg | DELAYED_RELEASE_TABLET | Freq: Every day | ORAL | Status: DC
  Administered 2020-09-24 – 2020-09-27 (×3): 40 mg via ORAL
  Filled 2020-09-23 (×3): qty 1

## 2020-09-23 MED ORDER — DIPHENHYDRAMINE HCL 25 MG PO CAPS: 25.0000 mg | ORAL_CAPSULE | Freq: Four times a day (QID) | ORAL | Status: DC | PRN

## 2020-09-23 MED ORDER — INSULIN ASPART 100 UNIT/ML IJ SOLN
4.0000 [IU] | Freq: Three times a day (TID) | INTRAMUSCULAR | Status: DC
  Administered 2020-09-24 – 2020-09-27 (×7): 4 [IU] via SUBCUTANEOUS
  Filled 2020-09-23 (×7): qty 1

## 2020-09-23 MED ORDER — ACETAMINOPHEN 500 MG PO TABS
1000.0000 mg | ORAL_TABLET | Freq: Four times a day (QID) | ORAL | Status: DC
  Administered 2020-09-23 – 2020-09-27 (×12): 1000 mg via ORAL
  Filled 2020-09-23 (×12): qty 2

## 2020-09-23 MED ORDER — BUDESONIDE 0.25 MG/2ML IN SUSP: 2.0000 mL | Freq: Two times a day (BID) | RESPIRATORY_TRACT | Status: DC

## 2020-09-23 MED ORDER — ONDANSETRON 4 MG PO TBDP: 4.0000 mg | ORAL_TABLET | Freq: Four times a day (QID) | ORAL | Status: DC | PRN

## 2020-09-23 MED ORDER — METOPROLOL SUCCINATE ER 50 MG PO TB24
50.0000 mg | ORAL_TABLET | Freq: Every day | ORAL | Status: DC
  Administered 2020-09-27: 50 mg via ORAL
  Filled 2020-09-23 (×3): qty 1

## 2020-09-23 MED ORDER — DOXYCYCLINE HYCLATE 100 MG PO TABS
100.0000 mg | ORAL_TABLET | Freq: Once | ORAL | Status: AC
  Administered 2020-09-23: 100 mg via ORAL
  Filled 2020-09-23: qty 1

## 2020-09-23 MED ORDER — SODIUM CHLORIDE 0.9 % IV BOLUS: 1000.0000 mL | Freq: Once | INTRAVENOUS | Status: DC

## 2020-09-23 NOTE — ED Provider Notes (Signed)
[facility] Emergency Department Provider Note ____________________________________________   Event Date/Time   First MD Initiated Contact with Patient 09/23/20 1640     (approximate)  I have reviewed the triage vital signs and the nursing notes.  HISTORY  Chief Complaint Abnormal Lab   HPI Gina Chambers is a 80 y.o. femalewho presents to the ED for evaluation of abnormal outpatient laboratory tests.  Chart review indicates history of HTN, CHF, DM on insulin, COPD, hypothyroidism, DVT on Xarelto.  COVID-19 infection diagnosed on 5/25, managed as an outpatient with Paxlovid.   Patient presents to the ED via POV with her daughter for evaluation of elevated WBC count as an outpatient.  Reviewed these lab values, initially blood drawn on 5/25 when she saw her PCP for various complaints, including being diagnosed with COVID-19.  4 CBCs since then, 25-> 19->17->20k today.   Patient reports, and daughter confirms, that her respiratory symptoms have improved over the past week.  She denies any fevers, chills.  She reports feeling "okay" and may be a little weak all over.  Denies any focal pain right now.  They tell me about a boil/abscess that has been on her left inguinal crease for the past couple months.  She reports that it is currently draining purulent material.  She reports having a course of azithromycin around her COVID diagnosis, since finished, and no other antibiotics. Patient reports about a week of right-sided abdominal pain and describes an episode of emesis that occurred few days ago.  Past Medical History:  Diagnosis Date  . Asthma   . Asthmatic bronchitis , chronic ([place])   . CHF (congestive heart failure) ([place])   . Diabetes mellitus without complication ([place])   . 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 last one approx 2013  . Family history of adverse reaction to anesthesia    Mother - temporary paralysis of 1 side  . GERD  (gastroesophageal reflux disease)   . Hypertension   . Neuropathy    feet  . PONV (postoperative nausea and vomiting)    Pt reports violent vomiting with ANY pain meds given with anesthesia.  . Sleep apnea    CPAP  . Thyroid disease   . Vertigo    daily    Patient Active Problem List   Diagnosis Date Noted  . Chest pain 12/24/2018  . Hypoglycemia 11/07/2017  . GIB (gastrointestinal bleeding) 11/07/2016  . 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) ([place]) 06/19/2016  . COPD exacerbation ([place]) 05/12/2016  . Community acquired pneumonia 05/12/2016  . Leukocytosis 05/12/2016  . Generalized weakness 05/12/2016  . Acquired hypothyroidism 12/15/2015  . HTN, goal below 140/90 12/15/2015    Past Surgical History:  Procedure Laterality Date  . CARDIAC SURGERY    . CATARACT EXTRACTION W/PHACO Right 03/11/2017   Procedure: CATARACT EXTRACTION PHACO AND INTRAOCULAR LENS PLACEMENT ([place])  RIGHT DIABETIC;  Surgeon: Eulogio Bear, MD;  Location: [place];  Service: Ophthalmology;  Laterality: Right;  Diabetic - insulin sleep apnea  . CATARACT EXTRACTION W/PHACO Left 04/02/2017   Procedure: CATARACT EXTRACTION PHACO AND INTRAOCULAR LENS PLACEMENT ([place]) LEFT DIABETES;  Surgeon: Eulogio Bear, MD;  Location: [place];  Service: Ophthalmology;  Laterality: Left;  Diabetic - insulin  . COLONOSCOPY WITH PROPOFOL N/A 11/09/2016   Procedure: COLONOSCOPY WITH PROPOFOL;  Surgeon: Jonathon Bellows, MD;  Location: [facility] ENDOSCOPY;  Service: Gastroenterology;  Laterality: N/A;  . HERNIA REPAIR    .  VERTICAL BANDED GASTROPLASTY      Prior to Admission medications   Medication Sig Start Date End Date Taking? Authorizing Provider  aspirin EC 81 MG tablet Take 81 mg by mouth daily.    [provider]  bumetanide (BUMEX) 0.5 MG tablet Take 0.5-1 mg by mouth See admin instructions. Take 2 tablets (1mg ) in the morning and 1 tablet (0.5 mg) in the evening     [provider]  Chromium Picolinate 200 MCG CAPS Take 200 mcg by mouth daily.    [provider]  diphenhydrAMINE (BENADRYL) 25 mg capsule Take by mouth. 04/27/19   [provider]  fluticasone (FLOVENT HFA) 110 MCG/ACT inhaler Inhale 2 puffs into the lungs 2 (two) times daily.     [provider]  gabapentin (NEURONTIN) 100 MG capsule Take by mouth. 02/18/20 02/17/21  [provider]  insulin glargine (LANTUS) 100 UNIT/ML injection Inject 10-20 Units into the skin See admin instructions. Inject 20u under the skin every morning and inject 10u under the skin every night    [provider]  insulin regular (NOVOLIN R,HUMULIN R) 250 units/2.38mL (100 units/mL) injection Inject 10 Units into the skin 4 (four) times daily.     [provider]  Ipratropium-Albuterol (COMBIVENT) 20-100 MCG/ACT AERS respimat Inhale 1 puff into the lungs 4 (four) times daily.     [provider]  lansoprazole (PREVACID) 30 MG capsule Take 30 mg by mouth 2 (two) times daily.    [provider]  levalbuterol Penne Lash) 0.63 MG/3ML nebulizer solution Inhale into the lungs. 04/27/19   [provider]  levothyroxine (SYNTHROID, LEVOTHROID) 125 MCG tablet Take 125 mcg by mouth daily.    [provider]  Magnesium 500 MG TABS Take 500 mg by mouth 4 (four) times daily.     [provider]  metoprolol succinate (TOPROL-XL) 50 MG 24 hr tablet Take 50 mg by mouth daily. Take with or immediately following a meal.    [provider]  montelukast (SINGULAIR) 10 MG tablet Take 10 mg by mouth at bedtime.    [provider]  Multiple Vitamins-Minerals (PRESERVISION AREDS 2 PO) Take 1 tablet by mouth 2 (two) times daily.    [provider]  MYRBETRIQ 25 MG TB24 tablet Take 25 mg by mouth daily. 12/21/19   [provider]  tolterodine (DETROL LA) 4 MG 24 hr capsule Take 4 mg by mouth daily.    [provider]  XARELTO 20 MG TABS tablet Take 20 mg by mouth daily. 02/20/20   [provider]    Allergies Ropinirole, Azelastine, Bupropion, Calcium, Carbidopa-levodopa, Cefuroxime, Cephalexin, Clinoril [sulindac], Codeine, Duloxetine, Duloxetine hcl, Ezetimibe, Fenofibrate, Furosemide, Glipizide, Lorazepam, Lortab [hydrocodone-acetaminophen], Metaproterenol, Morphine and related, Nalbuphine, Naproxen, Norfloxacin, Rofecoxib, Sodium, Statins, Sulfa antibiotics, Sulfasalazine, Tramadol, Trazodone and nefazodone, Doxycycline, Iodine, Ketoprofen, Piroxicam, and Tolmetin  Family History  Problem Relation Age of Onset  . Breast cancer Mother        35's  . Breast cancer Maternal Grandmother   . Breast cancer Paternal Grandmother     Social History Social History   Tobacco Use  . Smoking status: Never Smoker  . Smokeless tobacco: Never Used  Vaping Use  . Vaping Use: Never used  Substance Use Topics  . Alcohol use: No  . Drug use: No    Review of Systems  Constitutional: No fever/chills.  Positive generalized weakness Eyes: No visual changes. ENT: No sore throat. Cardiovascular: Denies chest pain. Respiratory: Denies  shortness of breath. Gastrointestinal: Positive for abdominal pain and emesis.  No diarrhea.  No constipation. Genitourinary: Negative for dysuria. Musculoskeletal: Negative for back pain. Left inguinal abscess Skin: Negative for rash. Neurological: Negative for headaches, focal weakness or numbness.  ____________________________________________   PHYSICAL EXAM:  VITAL SIGNS: Vitals:   09/23/20 1645 09/23/20 1848  BP: (!) 99/47 (!) 117/53  Pulse: 74 68  Resp:  18  Temp:    SpO2: 99% 100%    Constitutional: Alert and oriented. Well appearing and in no acute distress.  Conversational.  Able to assist with removing her pants, repositioning in bed.  Can pull her self up in bed. Eyes: Conjunctivae are normal. PERRL. EOMI. Head:  Atraumatic. Nose: No congestion/rhinnorhea. Mouth/Throat: Mucous membranes are moist.  Oropharynx non-erythematous. Neck: No stridor. No cervical spine tenderness to palpation. Cardiovascular: Normal rate, regular rhythm. Grossly normal heart sounds.  Good peripheral circulation. Respiratory: Normal respiratory effort.  No retractions. Lungs CTAB. Gastrointestinal: Soft , nondistended. No CVA tenderness.  Mild RUQ abdominal tenderness to palpation without peritoneal features. Musculoskeletal: No lower extremity tenderness.  No joint effusions. No signs of acute trauma. Neurologic:  Normal speech and language. No gross focal neurologic deficits are appreciated. No gait instability noted. Skin:  Skin is warm, dry  Draining abscess to the left inguinal crease.  Small, about 1 cm, area of fluctuance beneath the skin, when squeezed it does produce purulence and some discomfort to the patient.  Generally well-tolerated.  No extending cellulitis around this. No signs of labial abscess on superficial GU examination. Psychiatric: Mood and affect are normal. Speech and behavior are normal. ____________________________________________   LABS (all labs ordered are listed, but only abnormal results are displayed)  Labs Reviewed  COMPREHENSIVE METABOLIC PANEL - Abnormal; Notable for the following components:      Result Value   Sodium 125 (*)    Chloride 90 (*)    Glucose, Bld 152 (*)    Calcium 8.2 (*)    Total Protein 5.8 (*)    Albumin 2.7 (*)    AST 72 (*)    Total Bilirubin 1.3 (*)    All other components within normal limits  CBC WITH DIFFERENTIAL/PLATELET - Abnormal; Notable for the following components:   WBC 28.3 (*)    RBC 3.20 (*)    Hemoglobin 9.5 (*)    HCT 28.1 (*)    Neutro Abs 17.3 (*)    Lymphs Abs 8.2 (*)    Monocytes Absolute 1.6 (*)    Abs Immature Granulocytes 0.96 (*)    All other components within normal limits  URINALYSIS, COMPLETE (UACMP) WITH MICROSCOPIC -  Abnormal; Notable for the following components:   Color, Urine YELLOW (*)    APPearance CLEAR (*)    Hgb urine dipstick SMALL (*)    Nitrite POSITIVE (*)    Bacteria, UA RARE (*)    All other components within normal limits  URINE CULTURE  RESP PANEL BY RT-PCR (FLU A&B, COVID) ARPGX2  LACTIC ACID, PLASMA  LACTIC ACID, PLASMA  PATHOLOGIST SMEAR REVIEW   ____________________________________________  12 Lead EKG   ____________________________________________  RADIOLOGY  ED MD interpretation: CT reviewed by me with evidence of cholecystitis and no inguinal deep abscess  Official radiology report(s): CT ABDOMEN PELVIS WO CONTRAST  Result Date: 09/23/2020 CLINICAL DATA:  Right-sided abdominal pain and diarrhea. EXAM: CT ABDOMEN AND PELVIS WITHOUT CONTRAST TECHNIQUE: Multidetector CT imaging of the abdomen and pelvis was performed following the standard protocol without IV contrast.  COMPARISON:  None. FINDINGS: Lower chest: Right lower lobe atelectasis or scarring noted. Hepatobiliary: No mass visualized on this unenhanced exam. Several small calcified gallstones are seen. The gallbladder is distended and shows diffuse wall thickening and pericholecystic inflammatory changes, consistent with acute cholecystitis. No evidence of biliary ductal dilatation. Pancreas: No mass or inflammatory process visualized on this unenhanced exam. Spleen:  Within normal limits in size. Adrenals/Urinary tract: No evidence of urolithiasis or hydronephrosis. Unremarkable unopacified urinary bladder. Stomach/Bowel: Surgical staples again seen in the proximal stomach. No evidence of obstruction, inflammatory process, or abnormal fluid collections. Vascular/Lymphatic: No pathologically enlarged lymph nodes identified. No evidence of abdominal aortic aneurysm. Aortic atherosclerotic calcification noted. Reproductive:  No mass or other significant abnormality. Other:  None. Musculoskeletal:  No suspicious bone lesions  identified. IMPRESSION: Findings consistent with acute cholecystitis. No evidence of biliary ductal dilatation. Aortic Atherosclerosis (ICD10-I70.0). Electronically Signed   By: Marlaine Hind M.D.   On: 09/23/2020 18:20    ____________________________________________   PROCEDURES and INTERVENTIONS  Procedure(s) performed (including Critical Care):  .1-3 Lead EKG Interpretation Performed by: Vladimir Crofts, MD Authorized by: Vladimir Crofts, MD     Interpretation: normal     ECG rate:  76   ECG rate assessment: normal     Rhythm: sinus rhythm     Ectopy: none     Conduction: normal      Medications  piperacillin-tazobactam (ZOSYN) IVPB 3.375 g (has no administration in time range)  lactated ringers bolus 1,000 mL (has no administration in time range)  lactated ringers infusion (has no administration in time range)  doxycycline (VIBRA-TABS) tablet 100 mg (100 mg Oral Given 09/23/20 1747)    ____________________________________________   MDM / ED COURSE   80 year old woman presents to the ED due to abnormal outpatient WBC count, with evidence of cholecystitis as the likely cause, requiring surgical admission for Xarelto washout and cholecystectomy.  Exam with some mild RUQ tenderness without peritoneal features.  Abdomen is otherwise benign.  She looks well clinically.  Her inguinal exam demonstrates appears to be a small superficial already-draining abscess to the left inguinal crease without evidence of Fournier's gangrene or superimposed cellulitis.  Blood work with rising leukocytosis, now 28,000.  No lactic acidosis or signs of sepsis.  CT imaging obtained due to her abdominal pain, emesis and acute renal abscess.  No evidence of deep extension of this abscess or other pelvic pathology, but does demonstrate evidence of acute cholecystitis.  This is the likely etiology of her leukocytosis as an outpatient.  I discussed the case with general surgery who recommends admission for Xarelto  washout, Zosyn and plans for cholecystectomy.   Clinical Course as of 09/23/20 1849  Fri Sep 23, 2020  1843 Educated patient of cholecystitis.  Answered questions.  Surgery paged. [DS]  1846 Discussed the case with Dr. Celine Ahr. She recommends Zosyn [DS]    Clinical Course User Index [DS] Vladimir Crofts, MD    ____________________________________________   FINAL CLINICAL IMPRESSION(S) / ED DIAGNOSES  Final diagnoses:  Cholecystitis  Inguinal abscess     ED Discharge Orders    None       Gordon Carlson Tamala Julian   Note:  This document was prepared using Dragon voice recognition software and may include unintentional dictation errors.   Vladimir Crofts, MD 09/23/20 [phone]

## 2020-09-23 NOTE — ED Notes (Signed)
Pt states she cannot get fluids without Bumex. Fluids discontinued per pt refusal

## 2020-09-23 NOTE — ED Notes (Signed)
No lactic acid needed per MD Tamala Julian

## 2020-09-23 NOTE — Plan of Care (Signed)
  Problem: Education: Goal: Knowledge of General Education information will improve Description: Including pain rating scale, medication(s)/side effects and non-pharmacologic comfort measures Outcome: Progressing   Problem: Health Behavior/Discharge Planning: Goal: Ability to manage health-related needs will improve Outcome: Progressing   Problem: Clinical Measurements: Goal: Ability to maintain clinical measurements within normal limits will improve Outcome: Not Progressing Goal: Will remain free from infection Outcome: Not Met (add Reason) Note: Pt. Just admitted today  Goal: Diagnostic test results will improve Outcome: Progressing Goal: Respiratory complications will improve Outcome: Not Progressing Goal: Cardiovascular complication will be avoided Outcome: Progressing   Problem: Activity: Goal: Risk for activity intolerance will decrease Outcome: Not Progressing   Problem: Nutrition: Goal: Adequate nutrition will be maintained Outcome: Progressing   Problem: Coping: Goal: Level of anxiety will decrease Outcome: Progressing   Problem: Elimination: Goal: Will not experience complications related to bowel motility Outcome: Progressing Goal: Will not experience complications related to urinary retention Outcome: Progressing   Problem: Pain Managment: Goal: General experience of comfort will improve Outcome: Progressing   Problem: Safety: Goal: Ability to remain free from injury will improve Outcome: Progressing   Problem: Skin Integrity: Goal: Risk for impaired skin integrity will decrease Outcome: Progressing

## 2020-09-23 NOTE — ED Triage Notes (Addendum)
Pt to ER via POV after having labs drawn today at [facility] clinic. Pt with daughter, daughter states that this is the fourth time that they have had labs drawn to monitor her WBC count but that her WBC count is still high.   Pt has a fungal infection present to a draining boil that might be tunneling. Daughter reports patient has been taking abx for this and the boil has decreased in size. Boil present to groin x2 months. Rash has also been present to right arm, chest, and back. Has been seen by dermatologist, prescribed topical treatment for this, but rash has recurred.   Pt positive for covid 09/14/20. Has completed x5 days of antivirals and antibiotics. Pt symptoms improving, still has dry cough.

## 2020-09-23 NOTE — ED Notes (Signed)
Inpatient RN notified of covid positive result.

## 2020-09-23 NOTE — Progress Notes (Addendum)
HPI: This is a 80 y/o F, recently COVID positive (5/27, treated with molnupiravir) who was sent to the ED by her PCP due to persistent leukocytosis.  She has a boil in her inguinal fold that raised question of possible tunneling, so a CT scan was obtained. CT suggestive of acute cholecystitis. Patient on Xarelto for chronic DVT and took her medication this morning.  Labs show leukocytosis to 28K; hyponatremia and hypochloremia.  Review of EMR shows that she saw her PCP on 09/14/20 with complaints of abdominal pain, n/v, and bloating x 4-5 days. Labs obtained at that time showed elevated WBC. She was prescribed abx and labs were rechecked on several occasions, showing persistent elevation.  General surgery has been consulted for management of acute cholecystitis.  Ms. Andreatta states that her primary issue right now is that she is feeling very short of breath.  She corroborates the above history, saying that her daughter and son-in-law returned from a trip to [place] and were both COVID-positive, just before she developed her abdominal symptoms.  She says that she had COVID previously, in 2020, and had similar symptoms, along with a rash and dyspnea.  Past Medical History:  Diagnosis Date  . Asthma   . Asthmatic bronchitis , chronic ([place])   . CHF (congestive heart failure) ([place])   . Diabetes mellitus without complication ([place])   . 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 last one approx 2013  . Family history of adverse reaction to anesthesia    Mother - temporary paralysis of 1 side  . GERD (gastroesophageal reflux disease)   . Hypertension   . Neuropathy    feet  . PONV (postoperative nausea and vomiting)    Pt reports violent vomiting with ANY pain meds given with anesthesia.  . Sleep apnea    CPAP  . Thyroid disease   . Vertigo    daily   Past Surgical History:  Procedure Laterality Date  . CARDIAC SURGERY    . CATARACT EXTRACTION W/PHACO Right 03/11/2017   Procedure:  CATARACT EXTRACTION PHACO AND INTRAOCULAR LENS PLACEMENT ([place])  RIGHT DIABETIC;  Surgeon: Eulogio Bear, MD;  Location: [facility];  Service: Ophthalmology;  Laterality: Right;  Diabetic - insulin sleep apnea  . CATARACT EXTRACTION W/PHACO Left 04/02/2017   Procedure: CATARACT EXTRACTION PHACO AND INTRAOCULAR LENS PLACEMENT ([place]) LEFT DIABETES;  Surgeon: Eulogio Bear, MD;  Location: [facility];  Service: Ophthalmology;  Laterality: Left;  Diabetic - insulin  . COLONOSCOPY WITH PROPOFOL N/A 11/09/2016   Procedure: COLONOSCOPY WITH PROPOFOL;  Surgeon: Jonathon Bellows, MD;  Location: [facility] ENDOSCOPY;  Service: Gastroenterology;  Laterality: N/A;  . HERNIA REPAIR    . VERTICAL BANDED GASTROPLASTY     Family History  Problem Relation Age of Onset  . Breast cancer Mother        1's  . Breast cancer Maternal Grandmother   . Breast cancer Paternal Grandmother    Social History   Tobacco Use  . Smoking status: Never Smoker  . Smokeless tobacco: Never Used  Vaping Use  . Vaping Use: Never used  Substance Use Topics  . Alcohol use: No  . Drug use: No    Current Facility-Administered Medications:  .  0.9 %  sodium chloride infusion, , Intravenous, Continuous, Fredirick Maudlin, MD, Last Rate: 100 mL/hr at 09/24/20 1045, Infusion Verify at 09/24/20 1045 .  acetaminophen (TYLENOL) tablet 1,000 mg, 1,000 mg, Oral, Q6H, Fredirick Maudlin, MD, 1,000 mg  at 09/24/20 1334 .  benzonatate (TESSALON) capsule 200 mg, 200 mg, Oral, TID, Agbata, Tochukwu, MD .  bumetanide (BUMEX) tablet 1 mg, 1 mg, Oral, q AM **AND** bumetanide (BUMEX) tablet 0.5 mg, 0.5 mg, Oral, QPM, Washburn Moulder, RPH, 0.5 mg at 09/23/20 2015 .  diphenhydrAMINE (BENADRYL) capsule 25 mg, 25 mg, Oral, Q6H PRN, Fredirick Maudlin, MD .  fluticasone (FLOVENT HFA) 110 MCG/ACT inhaler 2 puff, 2 puff, Inhalation, BID, Dorothe Pea, RPH, 2 puff at 09/24/20 8413 .  gabapentin (NEURONTIN) capsule 200 mg, 200 mg,  Oral, QHS, Fredirick Maudlin, MD .  HYDROmorphone (DILAUDID) injection 0.5 mg, 0.5 mg, Intravenous, Q4H PRN, Fredirick Maudlin, MD .  insulin aspart (novoLOG) injection 0-15 Units, 0-15 Units, Subcutaneous, TID WC, Fredirick Maudlin, MD, 2 Units at 09/24/20 1335 .  insulin aspart (novoLOG) injection 4 Units, 4 Units, Subcutaneous, TID WC, Fredirick Maudlin, MD, 4 Units at 09/24/20 1335 .  insulin glargine (LANTUS) injection 10 Units, 10 Units, Subcutaneous, Daily, Agbata, Tochukwu, MD .  Ipratropium-Albuterol (COMBIVENT) respimat 1 puff, 1 puff, Inhalation, QID PRN, Fredirick Maudlin, MD, 1 puff at 09/24/20 0946 .  levalbuterol (XOPENEX HFA) inhaler 2 puff, 2 puff, Inhalation, Q6H, Agbata, Tochukwu, MD .  levothyroxine (SYNTHROID) tablet 125 mcg, 125 mcg, Oral, Daily, Fredirick Maudlin, MD, 125 mcg at 09/24/20 0545 .  magnesium oxide (MAG-OX) tablet 400 mg, 400 mg, Oral, QID, Fredirick Maudlin, MD, 400 mg at 09/24/20 1334 .  methylPREDNISolone sodium succinate (SOLU-MEDROL) 40 mg/mL injection 40 mg, 40 mg, Intravenous, Daily, Agbata, Tochukwu, MD, 40 mg at 09/24/20 1334 .  metoprolol succinate (TOPROL-XL) 24 hr tablet 50 mg, 50 mg, Oral, Daily, Fredirick Maudlin, MD .  mirabegron ER [facility]) tablet 25 mg, 25 mg, Oral, Daily, Fredirick Maudlin, MD, 25 mg at 09/24/20 0923 .  montelukast (SINGULAIR) tablet 10 mg, 10 mg, Oral, QHS, Fredirick Maudlin, MD, 10 mg at 09/23/20 2259 .  multivitamin-lutein (OCUVITE-LUTEIN) capsule 1 capsule, 1 capsule, Oral, BID, Fredirick Maudlin, MD, 1 capsule at 09/24/20 0920 .  ondansetron (ZOFRAN-ODT) disintegrating tablet 4 mg, 4 mg, Oral, Q6H PRN **OR** ondansetron (ZOFRAN) injection 4 mg, 4 mg, Intravenous, Q6H PRN, Fredirick Maudlin, MD .  pantoprazole (PROTONIX) EC tablet 40 mg, 40 mg, Oral, Daily, Fredirick Maudlin, MD, 40 mg at 09/24/20 0919 .  piperacillin-tazobactam (ZOSYN) IVPB 3.375 g, 3.375 g, Intravenous, Q8H, Fredirick Maudlin, MD, Last Rate: 12.5 mL/hr at 09/24/20  1336, 3.375 g at 09/24/20 1336 .  simethicone (MYLICON) chewable tablet 40 mg, 40 mg, Oral, Q6H PRN, Fredirick Maudlin, MD .  triamcinolone cream (KENALOG) 0.1 % cream, , Topical, TID, Fredirick Maudlin, MD   Allergies  Allergen Reactions  . Ropinirole Nausea And Vomiting  . Azelastine Other (See Comments)    Closes airways  Closes airways    . Bupropion     GI issues   . Calcium     Chest pain   . Carbidopa-Levodopa Other (See Comments)    Patient reports chest pain, back pain and left side pain  . Cefuroxime     Swelling   . Cephalexin   . Clinoril [Sulindac]     GI  . Codeine Hives  . Duloxetine     Bleeding   . Duloxetine Hcl Other (See Comments)    Bleeding  . Ezetimibe     Joint pain   . Fenofibrate     Leg cramps  . Furosemide     Fluid retention   . Glipizide     Bloating   .  Lorazepam     SI  . Lortab [Hydrocodone-Acetaminophen] Hives  . Metaproterenol     Palpitations   . Morphine And Related   . Nalbuphine     Rapid heart rate  flushing   . Naproxen     Numbness   . Norfloxacin     Urinary retention   . Rofecoxib   . Sodium   . Statins     Muscle pain   . Sulfa Antibiotics     Unknown   . Sulfasalazine Other (See Comments)    Unknown  . Tramadol   . Trazodone And Nefazodone   . Doxycycline Rash  . Iodine Rash  . Ketoprofen Rash  . Piroxicam Rash  . Tolmetin Rash   Review of Systems  Constitutional: Positive for malaise/fatigue.  Respiratory: Positive for cough, shortness of breath and wheezing.   Gastrointestinal: Positive for abdominal pain and diarrhea.  Neurological: Positive for weakness.  All other systems reviewed and are negative.   Today's Vitals   09/24/20 0525 09/24/20 0910 09/24/20 1038 09/24/20 1134  BP: (!) 103/47 (!) 91/46 (!) 92/52 (!) 108/51  Pulse: 72 73 75 81  Resp: 18 (!) 26 (!) 22 20  Temp: 98.2 F (36.8 C) 98 F (36.7 C) 98.1 F (36.7 C) 98.5 F (36.9 C)  TempSrc: Oral Oral Oral Oral  SpO2: 100%  100% 97% 99%  Height:      PainSc:  0-No pain     Body mass index is 34.19 kg/m. Physical Exam Constitutional:      Appearance: She is obese.  HENT:     Head: Normocephalic and atraumatic.     Mouth/Throat:     Comments: She says she has an ulcer on her tongue; there is a slight raised and smooth-appearing area on the left tongue about one third of the way back from the tip.  No leukoplakia or ulceration visible Eyes:     General: No scleral icterus.       Right eye: No discharge.        Left eye: No discharge.  Cardiovascular:     Rate and Rhythm: Normal rate and regular rhythm.     Pulses: Normal pulses.  Pulmonary:     Breath sounds: Wheezing present.     Comments: Tachypneic Abdominal:     Palpations: Abdomen is soft.     Tenderness: There is abdominal tenderness.     Comments: She is tender to palpation in the right upper quadrant, but Percell Miller sign is technically negative.  No rebound or guarding.  Genitourinary:    Comments: Deferred Musculoskeletal:        General: No deformity or signs of injury.     Cervical back: No rigidity.  Skin:    Comments: There is a small superficial boil in her left inguinal fold.  No erythema, induration or tenderness.  Small amount of purulent drainage expressed.  Neurological:     General: No focal deficit present.     Mental Status: She is alert and oriented to person, place, and time.  Psychiatric:        Mood and Affect: Mood normal.        Behavior: Behavior normal.      Results for Hitsman, Talibah A "JUDY" (MRN [id number]) as of 09/23/2020 18:45  Ref. Range 09/23/2020 16:36  COMPREHENSIVE METABOLIC PANEL Unknown Rpt (A)  Sodium Latest Ref Range: 135 - 145 mmol/L 125 (L)  Potassium Latest Ref Range: 3.5 - 5.1 mmol/L 3.9  Chloride Latest Ref Range: 98 - 111 mmol/L 90 (L)  CO2 Latest Ref Range: 22 - 32 mmol/L 25  Glucose Latest Ref Range: 70 - 99 mg/dL 152 (H)  BUN Latest Ref Range: 8 - 23 mg/dL 16  Creatinine Latest Ref Range: 0.44 -  1.00 mg/dL 0.93  Calcium Latest Ref Range: 8.9 - 10.3 mg/dL 8.2 (L)  Anion gap Latest Ref Range: 5 - 15  10  Alkaline Phosphatase Latest Ref Range: 38 - 126 U/L 106  Albumin Latest Ref Range: 3.5 - 5.0 g/dL 2.7 (L)  AST Latest Ref Range: 15 - 41 U/L 72 (H)  ALT Latest Ref Range: 0 - 44 U/L 39  Total Protein Latest Ref Range: 6.5 - 8.1 g/dL 5.8 (L)  Total Bilirubin Latest Ref Range: 0.3 - 1.2 mg/dL 1.3 (H)  GFR, Estimated Latest Ref Range: >60 mL/min >60  Lactic Acid, Venous Latest Ref Range: 0.5 - 1.9 mmol/L 1.0  WBC Latest Ref Range: 4.0 - 10.5 K/uL 28.3 (H)  RBC Latest Ref Range: 3.87 - 5.11 MIL/uL 3.20 (L)  Hemoglobin Latest Ref Range: 12.0 - 15.0 g/dL 9.5 (L)  HCT Latest Ref Range: 36.0 - 46.0 % 28.1 (L)  MCV Latest Ref Range: 80.0 - 100.0 fL 87.8  MCH Latest Ref Range: 26.0 - 34.0 pg 29.7  MCHC Latest Ref Range: 30.0 - 36.0 g/dL 33.8  RDW Latest Ref Range: 11.5 - 15.5 % 14.4  Platelets Latest Ref Range: 150 - 400 K/uL 266  nRBC Latest Ref Range: 0.0 - 0.2 % 0.0   CT scan personally reviewed CLINICAL DATA:  Right-sided abdominal pain and diarrhea.  EXAM: CT ABDOMEN AND PELVIS WITHOUT CONTRAST  TECHNIQUE: Multidetector CT imaging of the abdomen and pelvis was performed following the standard protocol without IV contrast.  COMPARISON:  None.  FINDINGS: Lower chest: Right lower lobe atelectasis or scarring noted.  Hepatobiliary: No mass visualized on this unenhanced exam. Several small calcified gallstones are seen. The gallbladder is distended and shows diffuse wall thickening and pericholecystic inflammatory changes, consistent with acute cholecystitis. No evidence of biliary ductal dilatation.  Pancreas: No mass or inflammatory process visualized on this unenhanced exam.  Spleen:  Within normal limits in size.  Adrenals/Urinary tract: No evidence of urolithiasis or hydronephrosis. Unremarkable unopacified urinary bladder.  Stomach/Bowel: Surgical  staples again seen in the proximal stomach. No evidence of obstruction, inflammatory process, or abnormal fluid collections.  Vascular/Lymphatic: No pathologically enlarged lymph nodes identified. No evidence of abdominal aortic aneurysm. Aortic atherosclerotic calcification noted.  Reproductive:  No mass or other significant abnormality.  Other:  None.  Musculoskeletal:  No suspicious bone lesions identified.  IMPRESSION: Findings consistent with acute cholecystitis.  No evidence of biliary ductal dilatation.  Aortic Atherosclerosis (ICD10-I70.0).   IMPRESSION AND PLAN: 80 y/o F with recent COVID infection, persistent leukocytosis, abdominal pain, and diarrhea.  CT consistent with acute cholecystitis.  Her pulmonary status is tenuous and at this time, I would not consider cholecystectomy safe. --Admit to surgery; hospitalist consultation for management of medical comorbidities.  Appreciate hospitalist assistance with this patient. --OK for clear liquids; will advance to carb modified heart healthy diet today and make n.p.o. after midnight for IR procedure, hopefully tomorrow --IVF to correct hyponatremia --Hold Xarelto in anticipation of percutaneous cholecystostomy tube --IV abx (patient with extensive allergy list, appears able to take PCNs) -- No need for I&D of inguinal boil, as it is draining spontaneously and appears quite superficial.  Plan discussed in detail with the patient,  as well as hospitalist Dr. Francine Graven.

## 2020-09-23 NOTE — ED Notes (Signed)
Transportation requested. Visitor in room updated on covid visitation policy- daughter verbalizes understanding.

## 2020-09-23 NOTE — ED Notes (Signed)
Patient transported to CT 

## 2020-09-23 NOTE — ED Notes (Signed)
ED Provider at bedside. 

## 2020-09-24 DIAGNOSIS — Z794 Long term (current) use of insulin: Secondary | ICD-10-CM

## 2020-09-24 DIAGNOSIS — U071 COVID-19: Secondary | ICD-10-CM | POA: Diagnosis present

## 2020-09-24 DIAGNOSIS — E119 Type 2 diabetes mellitus without complications: Secondary | ICD-10-CM

## 2020-09-24 DIAGNOSIS — J441 Chronic obstructive pulmonary disease with (acute) exacerbation: Secondary | ICD-10-CM

## 2020-09-24 DIAGNOSIS — E871 Hypo-osmolality and hyponatremia: Secondary | ICD-10-CM | POA: Diagnosis present

## 2020-09-24 LAB — COMPREHENSIVE METABOLIC PANEL
ALT: 35 U/L (ref 0–44)
AST: 61 U/L — ABNORMAL HIGH (ref 15–41)
Albumin: 2.4 g/dL — ABNORMAL LOW (ref 3.5–5.0)
Alkaline Phosphatase: 87 U/L (ref 38–126)
Anion gap: 8 (ref 5–15)
BUN: 17 mg/dL (ref 8–23)
CO2: 26 mmol/L (ref 22–32)
Calcium: 8 mg/dL — ABNORMAL LOW (ref 8.9–10.3)
Chloride: 94 mmol/L — ABNORMAL LOW (ref 98–111)
Creatinine, Ser: 1 mg/dL (ref 0.44–1.00)
GFR, Estimated: 58 mL/min — ABNORMAL LOW (ref >60)
Glucose, Bld: 136 mg/dL — ABNORMAL HIGH (ref 70–99)
Potassium: 3.6 mmol/L (ref 3.5–5.1)
Sodium: 128 mmol/L — ABNORMAL LOW (ref 135–145)
Total Bilirubin: 1.4 mg/dL — ABNORMAL HIGH (ref 0.3–1.2)
Total Protein: 5.6 g/dL — ABNORMAL LOW (ref 6.5–8.1)

## 2020-09-24 LAB — PHOSPHORUS: Phosphorus: 3.7 mg/dL (ref 2.5–4.6)

## 2020-09-24 LAB — CBC
HCT: 29.4 % — ABNORMAL LOW (ref 36.0–46.0)
Hemoglobin: 10 g/dL — ABNORMAL LOW (ref 12.0–15.0)
MCH: 30.2 pg (ref 26.0–34.0)
MCHC: 34 g/dL (ref 30.0–36.0)
MCV: 88.8 fL (ref 80.0–100.0)
Platelets: 236 10*3/uL (ref 150–400)
RBC: 3.31 MIL/uL — ABNORMAL LOW (ref 3.87–5.11)
RDW: 14.6 % (ref 11.5–15.5)
WBC: 28 10*3/uL — ABNORMAL HIGH (ref 4.0–10.5)
nRBC: 0 % (ref 0.0–0.2)

## 2020-09-24 LAB — MAGNESIUM: Magnesium: 1.8 mg/dL (ref 1.7–2.4)

## 2020-09-24 LAB — GLUCOSE, CAPILLARY
Glucose-Capillary: 121 mg/dL — ABNORMAL HIGH (ref 70–99)
Glucose-Capillary: 138 mg/dL — ABNORMAL HIGH (ref 70–99)
Glucose-Capillary: 187 mg/dL — ABNORMAL HIGH (ref 70–99)
Glucose-Capillary: 252 mg/dL — ABNORMAL HIGH (ref 70–99)

## 2020-09-24 MED ORDER — METHYLPREDNISOLONE SODIUM SUCC 40 MG IJ SOLR
40.0000 mg | Freq: Every day | INTRAMUSCULAR | Status: DC
  Administered 2020-09-24 – 2020-09-27 (×4): 40 mg via INTRAVENOUS
  Filled 2020-09-24 (×4): qty 1

## 2020-09-24 MED ORDER — INSULIN GLARGINE 100 UNIT/ML ~~LOC~~ SOLN
10.0000 [IU] | Freq: Every day | SUBCUTANEOUS | Status: DC
  Administered 2020-09-24 – 2020-09-27 (×4): 10 [IU] via SUBCUTANEOUS
  Filled 2020-09-24 (×4): qty 0.1

## 2020-09-24 MED ORDER — LEVALBUTEROL TARTRATE 45 MCG/ACT IN AERO
2.0000 | INHALATION_SPRAY | Freq: Four times a day (QID) | RESPIRATORY_TRACT | Status: DC
  Administered 2020-09-24 – 2020-09-27 (×12): 2 via RESPIRATORY_TRACT
  Filled 2020-09-24: qty 15

## 2020-09-24 MED ORDER — SODIUM CHLORIDE 0.9 % IV BOLUS
1000.0000 mL | Freq: Once | INTRAVENOUS | Status: AC
  Administered 2020-09-24: 1000 mL via INTRAVENOUS

## 2020-09-24 MED ORDER — BENZONATATE 100 MG PO CAPS
200.0000 mg | ORAL_CAPSULE | Freq: Three times a day (TID) | ORAL | Status: DC
  Administered 2020-09-24 – 2020-09-27 (×7): 200 mg via ORAL
  Filled 2020-09-24 (×7): qty 2

## 2020-09-24 NOTE — Consult Note (Signed)
[facility] Medical Consultation  Gina Chambers JSE:[id number] DOB: Jun 10, 1940 DOA: 09/23/2020 PCP: Kirk Ruths, MD   Requesting physician: Dr Celine Ahr Date of consultation: 09/24/20 Reason for consultation: Management of medical comorbidities  Impression/Recommendations Principal Problem:   COPD exacerbation ([place]) Active Problems:   Acquired hypothyroidism   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) ([place])   Essential hypertension   Cholecystitis, acute   COVID-19 virus infection   Type 2 diabetes mellitus without complication ([place])   Hyponatremia    1. COPD with acute exacerbation Patient has a known history of COPD and appears to be acutely exacerbated.  She has a cough productive of clear phlegm associated with shortness of breath and diffuse wheezes. Place patient on scheduled Xopenex and Flovent.  We will start patient on Solu-Medrol 40 mg IV daily Continue as needed Combivent   2.  COVID-19 viral infection Patient is vaccinated and received her booster shot.  Her initial positive COVID test was on 09/14/20.  Most of her symptoms have resolved but she continues to have cough productive of clear phlegm. She completed a 5-day course of Paxlovid as well as antibiotic therapy.  She does not require any oxygen supplementation and chest x-ray does not show any findings suggestive of pneumonia. Continue supportive care   3.  Diabetes mellitus Patient is at increased risk for developing hyperglycemia due to systemic steroid administration. Resume Lantus at 10 units daily and uptitrate as needed to optimize glycemic control. Continue NovoLog sliding scale coverage Maintain consistent carbohydrate diet    4.  Hypothyroidism Continue Synthroid    5.  Chronic diastolic dysfunction CHF Continue Bumex and metoprolol    6.  Acute cholecystitis Continue IV Zosyn Further management per primary team   7. Hyponatremia Secondary to poor  oral intake Judicious IV fluid hydration  I will followup again tomorrow. Please contact me if I can be of assistance in the meanwhile. Thank you for this consultation.  Chief Complaint: Persistent leukocytosis  HPI:  Patient is a 80 year old Caucasian female with a history significant for hypertension, chronic diastolic dysfunction CHF, insulin-dependent diabetes mellitus, COPD, hypothyroidism and recently diagnosed COVID-19 infection.  She was sent to the emergency room by her primary care provider for evaluation of persistent leukocytosis. Patient denies any recent steroid use. She recently completed antibiotic therapy for an abscess in her left inguinal crease that is currently draining purulent material. She also reports a 1 week history of right upper quadrant pain associated with nausea and vomiting.  She said she thought she had pulled a muscle from her persistent nausea and vomiting. She denies having any fever or chills. She denies having any changes in her bowel habits, no headache, no chest pain, no blurred vision, no dizziness, no lightheadedness, no diaphoresis, no palpitations, no urinary symptoms, no shortness of breath, no leg swelling orthopnea. White count on presentation was 28,000 and patient had a CT scan of abdomen and pelvis which showed acute cholecystitis.  Patient was admitted to surgical service for further evaluation.  Review of Systems:  As per HPI otherwise all other systems reviewed and negative.   Past Medical History:  Diagnosis Date  . Asthma   . Asthmatic bronchitis , chronic ([place])   . CHF (congestive heart failure) ([place])   . Diabetes mellitus without complication ([place])   . 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 last one approx 2013  . Family history of adverse reaction to anesthesia  Mother - temporary paralysis of 1 side  . GERD (gastroesophageal reflux disease)   . Hypertension   . Neuropathy    feet  . PONV (postoperative  nausea and vomiting)    Pt reports violent vomiting with ANY pain meds given with anesthesia.  . Sleep apnea    CPAP  . Thyroid disease   . Vertigo    daily   Past Surgical History:  Procedure Laterality Date  . CARDIAC SURGERY    . CATARACT EXTRACTION W/PHACO Right 03/11/2017   Procedure: CATARACT EXTRACTION PHACO AND INTRAOCULAR LENS PLACEMENT ([place])  RIGHT DIABETIC;  Surgeon: Eulogio Bear, MD;  Location: [facility];  Service: Ophthalmology;  Laterality: Right;  Diabetic - insulin sleep apnea  . CATARACT EXTRACTION W/PHACO Left 04/02/2017   Procedure: CATARACT EXTRACTION PHACO AND INTRAOCULAR LENS PLACEMENT ([place]) LEFT DIABETES;  Surgeon: Eulogio Bear, MD;  Location: [facility];  Service: Ophthalmology;  Laterality: Left;  Diabetic - insulin  . COLONOSCOPY WITH PROPOFOL N/A 11/09/2016   Procedure: COLONOSCOPY WITH PROPOFOL;  Surgeon: Jonathon Bellows, MD;  Location[facility] ENDOSCOPY;  Service: Gastroenterology;  Laterality: N/A;  . HERNIA REPAIR    . VERTICAL BANDED GASTROPLASTY     Social History:  reports that she has never smoked. She has never used smokeless tobacco. She reports that she does not drink alcohol and does not use drugs.  Allergies  Allergen Reactions  . Ropinirole Nausea And Vomiting  . Azelastine Other (See Comments)    Closes airways  Closes airways    . Bupropion     GI issues   . Calcium     Chest pain   . Carbidopa-Levodopa Other (See Comments)    Patient reports chest pain, back pain and left side pain  . Cefuroxime     Swelling   . Cephalexin   . Clinoril [Sulindac]     GI  . Codeine Hives  . Duloxetine     Bleeding   . Duloxetine Hcl Other (See Comments)    Bleeding  . Ezetimibe     Joint pain   . Fenofibrate     Leg cramps  . Furosemide     Fluid retention   . Glipizide     Bloating   . Lorazepam     SI  . Lortab [Hydrocodone-Acetaminophen] Hives  . Metaproterenol     Palpitations   . Morphine And  Related   . Nalbuphine     Rapid heart rate  flushing   . Naproxen     Numbness   . Norfloxacin     Urinary retention   . Rofecoxib   . Sodium   . Statins     Muscle pain   . Sulfa Antibiotics     Unknown   . Sulfasalazine Other (See Comments)    Unknown  . Tramadol   . Trazodone And Nefazodone   . Doxycycline Rash  . Iodine Rash  . Ketoprofen Rash  . Piroxicam Rash  . Tolmetin Rash   Family History  Problem Relation Age of Onset  . Breast cancer Mother        8's  . Breast cancer Maternal Grandmother   . Breast cancer Paternal Grandmother     Prior to Admission medications   Medication Sig Start Date End Date Taking? Authorizing Provider  aspirin EC 81 MG tablet Take 81 mg by mouth at bedtime.   Yes [provider]  bumetanide (BUMEX) 0.5 MG tablet Take 0.5-1 mg  by mouth See admin instructions. Take 2 tablets (1mg ) in the morning and 1 tablet (0.5 mg) in the evening   Yes [provider]  Chromium Picolinate 200 MCG CAPS Take 200 mcg by mouth daily.   Yes [provider]  diphenhydrAMINE (BENADRYL) 25 mg capsule Take 25 mg by mouth every 6 (six) hours as needed. 04/27/19  Yes [provider]  fluticasone (FLOVENT HFA) 110 MCG/ACT inhaler Inhale 2 puffs into the lungs 2 (two) times daily.    Yes [provider]  gabapentin (NEURONTIN) 100 MG capsule Take 200 mg by mouth at bedtime. 02/18/20 02/17/21 Yes [provider]  insulin glargine (LANTUS) 100 UNIT/ML injection Inject 20 Units into the skin See admin instructions. Takes 20 units in the morning,Takes lantus at night based on sliding scale   Yes [provider]  insulin regular (NOVOLIN R,HUMULIN R) 250 units/2.75mL (100 units/mL) injection Inject 10 Units into the skin 4 (four) times daily. Only uses Sliding scale at night   Yes [provider]  Ipratropium-Albuterol (COMBIVENT) 20-100 MCG/ACT AERS respimat Inhale 1 puff into the lungs 4 (four)  times daily as needed for wheezing or shortness of breath.   Yes [provider]  lansoprazole (PREVACID) 30 MG capsule Take 30 mg by mouth 2 (two) times daily.   Yes [provider]  levalbuterol Penne Lash) 0.63 MG/3ML nebulizer solution Inhale 0.63 mg into the lungs every 6 (six) hours as needed. 04/27/19  Yes [provider]  levothyroxine (SYNTHROID, LEVOTHROID) 125 MCG tablet Take 125 mcg by mouth daily.   Yes [provider]  Magnesium 500 MG TABS Take 500 mg by mouth 4 (four) times daily.    Yes [provider]  metoprolol succinate (TOPROL-XL) 50 MG 24 hr tablet Take 50 mg by mouth daily. Take with or immediately following a meal.   Yes [provider]  montelukast (SINGULAIR) 10 MG tablet Take 10 mg by mouth at bedtime.   Yes [provider]  Multiple Vitamins-Minerals (PRESERVISION AREDS 2 PO) Take 1 tablet by mouth 2 (two) times daily.   Yes [provider]  MYRBETRIQ 25 MG TB24 tablet Take 25 mg by mouth daily. 12/21/19  Yes [provider]  triamcinolone cream (KENALOG) 0.1 % APPLY TO AFFECTED AREA 3 TIMES A DAY 08/31/20  Yes [provider]  XARELTO 20 MG TABS tablet Take 20 mg by mouth daily. 02/20/20  Yes [provider]   Physical Exam: Blood pressure (!) 108/51, pulse 81, temperature 98.5 F (36.9 C), temperature source Oral, resp. rate 20, height 5\' 3"  (1.6 m), SpO2 99 %. Vitals:   09/24/20 1038 09/24/20 1134  BP: (!) 92/52 (!) 108/51  Pulse: 75 81  Resp: (!) 22 20  Temp: 98.1 F (36.7 C) 98.5 F (36.9 C)  SpO2: 97% 99%     General: Elderly female, lying in bed and appears uncomfortable with frequent bouts of hacking cough  Eyes: No conjunctival pallor  ENT: Within normal limits  Neck: Supple no JVD  Cardiovascular: RRR S1,S2  Respiratory: Air entry in both lung fields, diffuse wheezes in both lung fields  Abdomen: Bowel sounds present, soft, tenderness in the right  upper quadrant  Skin: Warm and dry  Musculoskeletal: Within normal limits  Psychiatric: Normal mood and affect  Neurologic: Able to move all extremities  Labs on Admission:  Basic Metabolic Panel: Recent Labs  Lab 09/23/20 1636 09/24/20 0513  NA 125* 128*  K 3.9 3.6  CL  90* 94*  CO2 25 26  GLUCOSE 152* 136*  BUN 16 17  CREATININE 0.93 1.00  CALCIUM 8.2* 8.0*  MG  --  1.8  PHOS  --  3.7   Liver Function Tests: Recent Labs  Lab 09/23/20 1636 09/24/20 0513  AST 72* 61*  ALT 39 35  ALKPHOS 106 87  BILITOT 1.3* 1.4*  PROT 5.8* 5.6*  ALBUMIN 2.7* 2.4*   No results for input(s): LIPASE, AMYLASE in the last 168 hours. No results for input(s): AMMONIA in the last 168 hours. CBC: Recent Labs  Lab 09/23/20 1636 09/24/20 0513  WBC 28.3* 28.0*  NEUTROABS 17.3*  --   HGB 9.5* 10.0*  HCT 28.1* 29.4*  MCV 87.8 88.8  PLT 266 236   Cardiac Enzymes: No results for input(s): CKTOTAL, CKMB, CKMBINDEX, TROPONINI in the last 168 hours. BNP: Invalid input(s): POCBNP CBG: Recent Labs  Lab 09/23/20 2217 09/24/20 0739 09/24/20 1134  GLUCAP 144* 121* 138*    Radiological Exams on Admission: CT ABDOMEN PELVIS WO CONTRAST  Result Date: 09/23/2020 CLINICAL DATA:  Right-sided abdominal pain and diarrhea. EXAM: CT ABDOMEN AND PELVIS WITHOUT CONTRAST TECHNIQUE: Multidetector CT imaging of the abdomen and pelvis was performed following the standard protocol without IV contrast. COMPARISON:  None. FINDINGS: Lower chest: Right lower lobe atelectasis or scarring noted. Hepatobiliary: No mass visualized on this unenhanced exam. Several small calcified gallstones are seen. The gallbladder is distended and shows diffuse wall thickening and pericholecystic inflammatory changes, consistent with acute cholecystitis. No evidence of biliary ductal dilatation. Pancreas: No mass or inflammatory process visualized on this unenhanced exam. Spleen:  Within normal limits in size. Adrenals/Urinary  tract: No evidence of urolithiasis or hydronephrosis. Unremarkable unopacified urinary bladder. Stomach/Bowel: Surgical staples again seen in the proximal stomach. No evidence of obstruction, inflammatory process, or abnormal fluid collections. Vascular/Lymphatic: No pathologically enlarged lymph nodes identified. No evidence of abdominal aortic aneurysm. Aortic atherosclerotic calcification noted. Reproductive:  No mass or other significant abnormality. Other:  None. Musculoskeletal:  No suspicious bone lesions identified. IMPRESSION: Findings consistent with acute cholecystitis. No evidence of biliary ductal dilatation. Aortic Atherosclerosis (ICD10-I70.0). Electronically Signed   By: Marlaine Hind M.D.   On: 09/23/2020 18:20    EKG: Independently reviewed.   Time spent: 55 minutes  Laban Orourke [facility] Pager [phone]  If 7PM-7AM, please contact night-coverage [email or web address] Password [email or web address] 09/24/2020, 1:30 PM

## 2020-09-25 DIAGNOSIS — K810 Acute cholecystitis: Principal | ICD-10-CM

## 2020-09-25 DIAGNOSIS — I5031 Acute diastolic (congestive) heart failure: Secondary | ICD-10-CM

## 2020-09-25 DIAGNOSIS — E039 Hypothyroidism, unspecified: Secondary | ICD-10-CM

## 2020-09-25 LAB — COMPREHENSIVE METABOLIC PANEL
ALT: 39 U/L (ref 0–44)
AST: 63 U/L — ABNORMAL HIGH (ref 15–41)
Albumin: 2.7 g/dL — ABNORMAL LOW (ref 3.5–5.0)
Alkaline Phosphatase: 108 U/L (ref 38–126)
Anion gap: 11 (ref 5–15)
BUN: 24 mg/dL — ABNORMAL HIGH (ref 8–23)
CO2: 25 mmol/L (ref 22–32)
Calcium: 8.6 mg/dL — ABNORMAL LOW (ref 8.9–10.3)
Chloride: 93 mmol/L — ABNORMAL LOW (ref 98–111)
Creatinine, Ser: 1.07 mg/dL — ABNORMAL HIGH (ref 0.44–1.00)
GFR, Estimated: 53 mL/min — ABNORMAL LOW (ref >60)
Glucose, Bld: 187 mg/dL — ABNORMAL HIGH (ref 70–99)
Potassium: 4.2 mmol/L (ref 3.5–5.1)
Sodium: 129 mmol/L — ABNORMAL LOW (ref 135–145)
Total Bilirubin: 0.9 mg/dL (ref 0.3–1.2)
Total Protein: 6.4 g/dL — ABNORMAL LOW (ref 6.5–8.1)

## 2020-09-25 LAB — GLUCOSE, CAPILLARY
Glucose-Capillary: 186 mg/dL — ABNORMAL HIGH (ref 70–99)
Glucose-Capillary: 146 mg/dL — ABNORMAL HIGH (ref 70–99)
Glucose-Capillary: 196 mg/dL — ABNORMAL HIGH (ref 70–99)
Glucose-Capillary: 245 mg/dL — ABNORMAL HIGH (ref 70–99)

## 2020-09-25 LAB — MAGNESIUM: Magnesium: 2.2 mg/dL (ref 1.7–2.4)

## 2020-09-25 LAB — PHOSPHORUS: Phosphorus: 4.3 mg/dL (ref 2.5–4.6)

## 2020-09-25 LAB — PROTIME-INR
INR: 1.3 — ABNORMAL HIGH (ref 0.8–1.2)
Prothrombin Time: 15.7 seconds — ABNORMAL HIGH (ref 11.4–15.2)

## 2020-09-25 LAB — CBC
HCT: 31.5 % — ABNORMAL LOW (ref 36.0–46.0)
Hemoglobin: 10.5 g/dL — ABNORMAL LOW (ref 12.0–15.0)
MCH: 29.6 pg (ref 26.0–34.0)
MCHC: 33.3 g/dL (ref 30.0–36.0)
MCV: 88.7 fL (ref 80.0–100.0)
Platelets: 279 10*3/uL (ref 150–400)
RBC: 3.55 MIL/uL — ABNORMAL LOW (ref 3.87–5.11)
RDW: 14.7 % (ref 11.5–15.5)
WBC: 34.2 10*3/uL — ABNORMAL HIGH (ref 4.0–10.5)
nRBC: 0 % (ref 0.0–0.2)

## 2020-09-25 LAB — BRAIN NATRIURETIC PEPTIDE: B Natriuretic Peptide: 629.2 pg/mL — ABNORMAL HIGH (ref 0.0–100.0)

## 2020-09-25 LAB — C-REACTIVE PROTEIN: CRP: 22.1 mg/dL — ABNORMAL HIGH (ref <1.0)

## 2020-09-25 MED ORDER — FUROSEMIDE 10 MG/ML IJ SOLN
20.0000 mg | Freq: Two times a day (BID) | INTRAMUSCULAR | Status: DC
  Filled 2020-09-25: qty 4

## 2020-09-25 MED ORDER — ALUM & MAG HYDROXIDE-SIMETH 200-200-20 MG/5ML PO SUSP
30.0000 mL | Freq: Four times a day (QID) | ORAL | Status: DC | PRN
  Administered 2020-09-25: 30 mL via ORAL
  Filled 2020-09-25: qty 30

## 2020-09-25 NOTE — Progress Notes (Signed)
HPI: This is a 80 y/o F, recently COVID positive (5/27, treated with molnupiravir) who was sent to the ED by her PCP due to persistent leukocytosis.  She has a boil in her inguinal fold that raised question of possible tunneling, so a CT scan was obtained. CT suggestive of acute cholecystitis. Patient on Xarelto for chronic DVT and took her medication this morning.  Labs show leukocytosis to 28K; hyponatremia and hypochloremia.  Review of EMR shows that she saw her PCP on 09/14/20 with complaints of abdominal pain, n/v, and bloating x 4-5 days. Labs obtained at that time showed elevated WBC. She was prescribed abx and labs were rechecked on several occasions, showing persistent elevation.  General surgery has been consulted for management of acute cholecystitis.  Gina Chambers states that her primary issue right now is that she is feeling very short of breath.  She corroborates the above history, saying that her daughter and son-in-law returned from a trip to [place] and were both COVID-positive, just before she developed her abdominal symptoms.  She says that she had COVID previously, in 2020, and had similar symptoms, along with a rash and dyspnea.  INTERVAL HISTORY: Hospital medicine was consulted to better address the patient's respiratory complaints.  She was initiated on Solu-Medrol, which likely accounts for her increased white blood cell count today.  However, this is also had the positive result of alleviating much of her respiratory discomfort.  Her cough is no longer persistent, dry, and hacking; she only coughs in order to bring up sputum.  She also says that her abdominal pain has resolved and she tolerated a regular diet yesterday.  Past Medical History:  Diagnosis Date  . Asthma   . Asthmatic bronchitis , chronic ([place])   . CHF (congestive heart failure) ([place])   . Diabetes mellitus without complication ([place])   . 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 last one approx 2013   . Family history of adverse reaction to anesthesia    Mother - temporary paralysis of 1 side  . GERD (gastroesophageal reflux disease)   . Hypertension   . Neuropathy    feet  . PONV (postoperative nausea and vomiting)    Pt reports violent vomiting with ANY pain meds given with anesthesia.  . Sleep apnea    CPAP  . Thyroid disease   . Vertigo    daily   Past Surgical History:  Procedure Laterality Date  . CARDIAC SURGERY    . CATARACT EXTRACTION W/PHACO Right 03/11/2017   Procedure: CATARACT EXTRACTION PHACO AND INTRAOCULAR LENS PLACEMENT ([place])  RIGHT DIABETIC;  Surgeon: Eulogio Bear, MD;  Location: [facility];  Service: Ophthalmology;  Laterality: Right;  Diabetic - insulin sleep apnea  . CATARACT EXTRACTION W/PHACO Left 04/02/2017   Procedure: CATARACT EXTRACTION PHACO AND INTRAOCULAR LENS PLACEMENT ([place]) LEFT DIABETES;  Surgeon: Eulogio Bear, MD;  Location: [facility];  Service: Ophthalmology;  Laterality: Left;  Diabetic - insulin  . COLONOSCOPY WITH PROPOFOL N/A 11/09/2016   Procedure: COLONOSCOPY WITH PROPOFOL;  Surgeon: Jonathon Bellows, MD;  Location: [facility];  Service: Gastroenterology;  Laterality: N/A;  . HERNIA REPAIR    . VERTICAL BANDED GASTROPLASTY     Family History  Problem Relation Age of Onset  . Breast cancer Mother        7's  . Breast cancer Maternal Grandmother   . Breast cancer Paternal Grandmother    Social History   Tobacco Use  .  Smoking status: Never Smoker  . Smokeless tobacco: Never Used  Vaping Use  . Vaping Use: Never used  Substance Use Topics  . Alcohol use: No  . Drug use: No    Current Facility-Administered Medications:  .  0.9 %  sodium chloride infusion, , Intravenous, Continuous, Fredirick Maudlin, MD, Stopped at 09/24/20 1336 .  acetaminophen (TYLENOL) tablet 1,000 mg, 1,000 mg, Oral, Q6H, Fredirick Maudlin, MD, 1,000 mg at 09/25/20 0016 .  benzonatate (TESSALON) capsule 200 mg, 200 mg, Oral,  TID, Agbata, Tochukwu, MD, 200 mg at 09/24/20 2028 .  bumetanide (BUMEX) tablet 1 mg, 1 mg, Oral, q AM **AND** bumetanide (BUMEX) tablet 0.5 mg, 0.5 mg, Oral, QPM, Costantino Moulder, RPH, 0.5 mg at 09/24/20 1708 .  diphenhydrAMINE (BENADRYL) capsule 25 mg, 25 mg, Oral, Q6H PRN, Fredirick Maudlin, MD .  fluticasone (FLOVENT HFA) 110 MCG/ACT inhaler 2 puff, 2 puff, Inhalation, BID, Dorothe Pea, RPH, 2 puff at 09/25/20 0958 .  gabapentin (NEURONTIN) capsule 200 mg, 200 mg, Oral, QHS, Fredirick Maudlin, MD, 200 mg at 09/24/20 2027 .  HYDROmorphone (DILAUDID) injection 0.5 mg, 0.5 mg, Intravenous, Q4H PRN, Fredirick Maudlin, MD .  insulin aspart (novoLOG) injection 0-15 Units, 0-15 Units, Subcutaneous, TID WC, Fredirick Maudlin, MD, 3 Units at 09/25/20 [phone] .  insulin aspart (novoLOG) injection 4 Units, 4 Units, Subcutaneous, TID WC, Fredirick Maudlin, MD, 4 Units at 09/24/20 2043 .  insulin glargine (LANTUS) injection 10 Units, 10 Units, Subcutaneous, Daily, Agbata, Tochukwu, MD, 10 Units at 09/25/20 0956 .  Ipratropium-Albuterol (COMBIVENT) respimat 1 puff, 1 puff, Inhalation, QID PRN, Fredirick Maudlin, MD, 1 puff at 09/24/20 0946 .  levalbuterol (XOPENEX HFA) inhaler 2 puff, 2 puff, Inhalation, Q6H, Agbata, Tochukwu, MD, 2 puff at 09/25/20 0526 .  levothyroxine (SYNTHROID) tablet 125 mcg, 125 mcg, Oral, Daily, Fredirick Maudlin, MD, 125 mcg at 09/24/20 0545 .  magnesium oxide (MAG-OX) tablet 400 mg, 400 mg, Oral, QID, Fredirick Maudlin, MD, 400 mg at 09/24/20 2028 .  methylPREDNISolone sodium succinate (SOLU-MEDROL) 40 mg/mL injection 40 mg, 40 mg, Intravenous, Daily, Agbata, Tochukwu, MD, 40 mg at 09/25/20 0955 .  metoprolol succinate (TOPROL-XL) 24 hr tablet 50 mg, 50 mg, Oral, Daily, Fredirick Maudlin, MD .  mirabegron ER [facility]) tablet 25 mg, 25 mg, Oral, Daily, Fredirick Maudlin, MD, 25 mg at 09/24/20 0923 .  montelukast (SINGULAIR) tablet 10 mg, 10 mg, Oral, QHS, Fredirick Maudlin, MD, 10 mg at  09/24/20 2028 .  multivitamin-lutein (OCUVITE-LUTEIN) capsule 1 capsule, 1 capsule, Oral, BID, Fredirick Maudlin, MD, 1 capsule at 09/24/20 2028 .  ondansetron (ZOFRAN-ODT) disintegrating tablet 4 mg, 4 mg, Oral, Q6H PRN **OR** ondansetron (ZOFRAN) injection 4 mg, 4 mg, Intravenous, Q6H PRN, Fredirick Maudlin, MD .  pantoprazole (PROTONIX) EC tablet 40 mg, 40 mg, Oral, Daily, Fredirick Maudlin, MD, 40 mg at 09/24/20 0919 .  piperacillin-tazobactam (ZOSYN) IVPB 3.375 g, 3.375 g, Intravenous, Q8H, Fredirick Maudlin, MD, Last Rate: 12.5 mL/hr at 09/25/20 0524, 3.375 g at 09/25/20 0524 .  simethicone (MYLICON) chewable tablet 40 mg, 40 mg, Oral, Q6H PRN, Fredirick Maudlin, MD .  triamcinolone cream (KENALOG) 0.1 % cream, , Topical, TID, Fredirick Maudlin, MD, Given at 09/25/20 0957   Allergies  Allergen Reactions  . Ropinirole Nausea And Vomiting  . Azelastine Other (See Comments)    Closes airways  Closes airways    . Bupropion     GI issues   . Calcium     Chest pain   . Carbidopa-Levodopa Other (See  Comments)    Patient reports chest pain, back pain and left side pain  . Cefuroxime     Swelling   . Cephalexin   . Clinoril [Sulindac]     GI  . Codeine Hives  . Duloxetine     Bleeding   . Duloxetine Hcl Other (See Comments)    Bleeding  . Ezetimibe     Joint pain   . Fenofibrate     Leg cramps  . Furosemide     Fluid retention   . Glipizide     Bloating   . Lorazepam     SI  . Lortab [Hydrocodone-Acetaminophen] Hives  . Metaproterenol     Palpitations   . Morphine And Related   . Nalbuphine     Rapid heart rate  flushing   . Naproxen     Numbness   . Norfloxacin     Urinary retention   . Rofecoxib   . Sodium   . Statins     Muscle pain   . Sulfa Antibiotics     Unknown   . Sulfasalazine Other (See Comments)    Unknown  . Tramadol   . Trazodone And Nefazodone   . Doxycycline Rash  . Iodine Rash  . Ketoprofen Rash  . Piroxicam Rash  . Tolmetin Rash    Review of Systems  Constitutional: Positive for malaise/fatigue.  Respiratory: Positive for cough, shortness of breath and wheezing.        Now significantly improved  Gastrointestinal: Positive for abdominal pain and diarrhea.       Now resolved  Neurological: Positive for weakness.  All other systems reviewed and are negative.   Today's Vitals   09/25/20 0404 09/25/20 0829 09/25/20 1000 09/25/20 1204  BP: (!) 126/59 (!) 154/75  136/65  Pulse: 70 86  76  Resp: 18 18  18   Temp: 97.6 F (36.4 C) 98 F (36.7 C)  97.9 F (36.6 C)  TempSrc: Oral Oral  Oral  SpO2: 96% 99%  100%  Height:      PainSc:   0-No pain    Body mass index is 34.19 kg/m. Physical Exam Constitutional:      Appearance: She is obese.  HENT:     Head: Normocephalic and atraumatic.     Mouth/Throat:     Comments: She says she has an ulcer on her tongue; there is a slight raised and smooth-appearing area on the left tongue about one third of the way back from the tip.  No leukoplakia or ulceration visible Eyes:     General: No scleral icterus.       Right eye: No discharge.        Left eye: No discharge.  Cardiovascular:     Rate and Rhythm: Normal rate and regular rhythm.     Pulses: Normal pulses.  Pulmonary:     Effort: Pulmonary effort is normal. No respiratory distress.     Breath sounds: No wheezing.     Comments: Tachypneic Abdominal:     Palpations: Abdomen is soft.     Tenderness: There is no abdominal tenderness.     Comments: Tenderness has resolved.  Genitourinary:    Comments: Deferred Musculoskeletal:        General: No deformity or signs of injury.     Cervical back: No rigidity.  Skin:    Comments: There is a small superficial boil in her left inguinal fold.  No erythema, induration or tenderness.  Small amount of  purulent drainage expressed.  Neurological:     General: No focal deficit present.     Mental Status: She is alert and oriented to person, place, and time.   Psychiatric:        Mood and Affect: Mood normal.        Behavior: Behavior normal.     Results for Chambers, Gina A "JUDY" (MRN [id number]) as of 09/25/2020 12:54  Ref. Range 09/25/2020 05:29 09/25/2020 08:25 09/25/2020 11:12 09/25/2020 12:01  Sodium Latest Ref Range: 135 - 145 mmol/L 129 (L)     Potassium Latest Ref Range: 3.5 - 5.1 mmol/L 4.2     Chloride Latest Ref Range: 98 - 111 mmol/L 93 (L)     CO2 Latest Ref Range: 22 - 32 mmol/L 25     Glucose Latest Ref Range: 70 - 99 mg/dL 187 (H)     BUN Latest Ref Range: 8 - 23 mg/dL 24 (H)     Creatinine Latest Ref Range: 0.44 - 1.00 mg/dL 1.07 (H)     Calcium Latest Ref Range: 8.9 - 10.3 mg/dL 8.6 (L)     Anion gap Latest Ref Range: 5 - 15  11     Phosphorus Latest Ref Range: 2.5 - 4.6 mg/dL 4.3     Magnesium Latest Ref Range: 1.7 - 2.4 mg/dL 2.2     Alkaline Phosphatase Latest Ref Range: 38 - 126 U/L 108     Albumin Latest Ref Range: 3.5 - 5.0 g/dL 2.7 (L)     AST Latest Ref Range: 15 - 41 U/L 63 (H)     ALT Latest Ref Range: 0 - 44 U/L 39     Total Protein Latest Ref Range: 6.5 - 8.1 g/dL 6.4 (L)     Total Bilirubin Latest Ref Range: 0.3 - 1.2 mg/dL 0.9     GFR, Estimated Latest Ref Range: >60 mL/min 53 (L)     B Natriuretic Peptide Latest Ref Range: 0.0 - 100.0 pg/mL   629.2 (H)   WBC Latest Ref Range: 4.0 - 10.5 K/uL 34.2 (H)     RBC Latest Ref Range: 3.87 - 5.11 MIL/uL 3.55 (L)     Hemoglobin Latest Ref Range: 12.0 - 15.0 g/dL 10.5 (L)     HCT Latest Ref Range: 36.0 - 46.0 % 31.5 (L)     MCV Latest Ref Range: 80.0 - 100.0 fL 88.7     MCH Latest Ref Range: 26.0 - 34.0 pg 29.6     MCHC Latest Ref Range: 30.0 - 36.0 g/dL 33.3     RDW Latest Ref Range: 11.5 - 15.5 % 14.7     Platelets Latest Ref Range: 150 - 400 K/uL 279     nRBC Latest Ref Range: 0.0 - 0.2 % 0.0     Prothrombin Time Latest Ref Range: 11.4 - 15.2 seconds 15.7 (H)     INR Latest Ref Range: 0.8 - 1.2  1.3 (H)      Worsening leukocytosis likely due to steroid initiation.   Hyponatremia persists but bilirubin has normalized.  CT scan personally reviewed CLINICAL DATA:  Right-sided abdominal pain and diarrhea.  EXAM: CT ABDOMEN AND PELVIS WITHOUT CONTRAST  TECHNIQUE: Multidetector CT imaging of the abdomen and pelvis was performed following the standard protocol without IV contrast.  COMPARISON:  None.  FINDINGS: Lower chest: Right lower lobe atelectasis or scarring noted.  Hepatobiliary: No mass visualized on this unenhanced exam. Several small calcified gallstones are seen. The gallbladder is distended and  shows diffuse wall thickening and pericholecystic inflammatory changes, consistent with acute cholecystitis. No evidence of biliary ductal dilatation.  Pancreas: No mass or inflammatory process visualized on this unenhanced exam.  Spleen:  Within normal limits in size.  Adrenals/Urinary tract: No evidence of urolithiasis or hydronephrosis. Unremarkable unopacified urinary bladder.  Stomach/Bowel: Surgical staples again seen in the proximal stomach. No evidence of obstruction, inflammatory process, or abnormal fluid collections.  Vascular/Lymphatic: No pathologically enlarged lymph nodes identified. No evidence of abdominal aortic aneurysm. Aortic atherosclerotic calcification noted.  Reproductive:  No mass or other significant abnormality.  Other:  None.  Musculoskeletal:  No suspicious bone lesions identified.  IMPRESSION: Findings consistent with acute cholecystitis.  No evidence of biliary ductal dilatation.  Aortic Atherosclerosis (ICD10-I70.0).   IMPRESSION AND PLAN: 80 y/o F with recent COVID infection, persistent leukocytosis, abdominal pain, and diarrhea.  CT consistent with acute cholecystitis.  Her pulmonary status has improved, but at this time, I would not consider cholecystectomy safe. --Appreciate hospitalist assistance with this patient. --Currently n.p.o. for potential cholecystostomy tube;  discussed with radiology and due to hemodynamic stability, will defer placement until tomorrow.  Will reinitiate diet and make n.p.o. again after midnight. --IVF to correct hyponatremia --Xarelto on hold in anticipation of percutaneous cholecystostomy tube --IV abx (patient with extensive allergy list, appears able to take PCNs) -- No need for I&D of inguinal boil, as it is draining spontaneously and appears quite superficial.

## 2020-09-25 NOTE — Progress Notes (Signed)
Secure chat to Dr. Maryland Pink to notify that patient refused IV Lasix and reports that she is allergic to it.

## 2020-09-25 NOTE — Progress Notes (Addendum)
PROGRESS NOTE  Gina Chambers YBO:[id number] DOB: [birth year] DOA: 09/23/2020 PCP: Kirk Ruths, MD  HPI/Recap of past 82 hours: 80 year old female with past medical history of chronic diastolic heart failure, hypertension, diabetes mellitus and COPD plus hypothyroidism and recent COVID infection on 5/25 presented to the emergency room on 6/3 after being sent over by PCP for persistent leukocytosis despite completing antibiotic therapy for left inguinal crease abscess and patient was admitted for acute cholecystitis.  Placed on Zosyn on admission.  Hospitalists were consulted as patient continued to complain of shortness of breath.  Patient felt to be in acute COPD exacerbation.  Started on Solu-Medrol, continued on nebulizers.  Lantus added for hyperglycemia and now patient is on room air.  Given breathing and COVID issues, cholecystectomy is delayed at this time with plans for placement of a percutaneous drain by IR on 6/6.  Patient feeling much better today.  Breathing much more comfortably.  Less cough or dyspnea with exertion.  Assessment/Plan: Principal Problem:   COPD exacerbation ([employer]): Much improved.  Patient is on room air.  Continue steroids and nebulizers. Active Problems:   Leukocytosis: Waiting for final results of peripheral smear evaluation on 6/3.  Normal white blood cell count 2 years ago noted.  Patient had recent completed antibiotics.  Increase in white count today from admission likely in the setting of steroids as well as ongoing cholecystitis.  On Zosyn.  Will benefit from drain.    Acquired hypothyroidism: Continue Synthroid.  Acute on chronic diastolic heart failure with preserved left ventricular function (HFpEF) ([place]): Echocardiogram done 9/20.  BNP elevated at 629.  We will start some gentle IV Lasix    Essential hypertension: Continue home medications.  Blood pressure stable.    Cholecystitis, acute: As above.  Plans for percutaneous drainage tube to be  placed until patient better stabilizes for cholecystectomy.  On Zosyn for gut coverage.    COVID-19 virus infection: Patient completed outpatient paxlovid.  On room air.  Checking CRP level.    Type 2 diabetes mellitus without complication ([place]): Started back on Lantus.  Plus sliding scale.  Will check A1c.    Hyponatremia: In part due to dehydration?  Improved from 125 on admission to 129 today.  Monitor closely.   Code Status: Full code  Family Communication: Declined for me to update any family  Disposition Plan: Once gallbladder and CHF, COVID and COPD issues stabilized, then can go home   Consultants:  Hospitalists initially, but given multiple acute medical problems, patient changed to hospitalist service as attending with surgery consulting  Procedures:  Planned percutaneous drainage tube  Antimicrobials:  IV Zosyn 6/3-present  DVT prophylaxis: SCDs  Level of care: Med-Surg   Objective: Vitals:   09/24/20 1953 09/25/20 0404  BP: (!) 111/54 (!) 126/59  Pulse: 78 70  Resp:  18  Temp:  97.6 F (36.4 C)  SpO2:  96%    Intake/Output Summary (Last 24 hours) at 09/25/2020 1040 Last data filed at 09/25/2020 0426 Gross per 24 hour  Intake 1896.01 ml  Output 550 ml  Net 1346.01 ml   There were no vitals filed for this visit. Body mass index is 34.19 kg/m.  Exam:   General: Alert and oriented x3, no acute distress  HEENT: Normocephalic and atraumatic, mucous membranes slightly dry  Neck: Thick, narrow airway  Cardiovascular: Regular rate and rhythm, S1-S2  Respiratory: Decreased breath sounds throughout  Abdomen: Soft, tender in the right upper quadrant, nondistended, positive bowel sounds  Musculoskeletal: No clubbing or cyanosis or edema  Skin: No skin breaks, tears or lesions  Psychiatry: Appropriate, no evidence of psychoses  Neurology: No focal deficits   Data Reviewed: CBC: Recent Labs  Lab 09/23/20 1636 09/24/20 0513 09/25/20 0529   WBC 28.3* 28.0* 34.2*  NEUTROABS 17.3*  --   --   HGB 9.5* 10.0* 10.5*  HCT 28.1* 29.4* 31.5*  MCV 87.8 88.8 88.7  PLT 266 236 213   Basic Metabolic Panel: Recent Labs  Lab 09/23/20 1636 09/24/20 0513 09/25/20 0529  NA 125* 128* 129*  K 3.9 3.6 4.2  CL 90* 94* 93*  CO2 25 26 25   GLUCOSE 152* 136* 187*  BUN 16 17 24*  CREATININE 0.93 1.00 1.07*  CALCIUM 8.2* 8.0* 8.6*  MG  --  1.8 2.2  PHOS  --  3.7 4.3   GFR: CrCl cannot be calculated (Unknown ideal weight.). Liver Function Tests: Recent Labs  Lab 09/23/20 1636 09/24/20 0513 09/25/20 0529  AST 72* 61* 63*  ALT 39 35 39  ALKPHOS 106 87 108  BILITOT 1.3* 1.4* 0.9  PROT 5.8* 5.6* 6.4*  ALBUMIN 2.7* 2.4* 2.7*   No results for input(s): LIPASE, AMYLASE in the last 168 hours. No results for input(s): AMMONIA in the last 168 hours. Coagulation Profile: Recent Labs  Lab 09/25/20 0529  INR 1.3*   Cardiac Enzymes: No results for input(s): CKTOTAL, CKMB, CKMBINDEX, TROPONINI in the last 168 hours. BNP (last 3 results) No results for input(s): PROBNP in the last 8760 hours. HbA1C: No results for input(s): HGBA1C in the last 72 hours. CBG: Recent Labs  Lab 09/24/20 0739 09/24/20 1134 09/24/20 1638 09/24/20 1946 09/25/20 0825  GLUCAP 121* 138* 187* 252* 186*   Lipid Profile: No results for input(s): CHOL, HDL, LDLCALC, TRIG, CHOLHDL, LDLDIRECT in the last 72 hours. Thyroid Function Tests: No results for input(s): TSH, T4TOTAL, FREET4, T3FREE, THYROIDAB in the last 72 hours. Anemia Panel: No results for input(s): VITAMINB12, FOLATE, FERRITIN, TIBC, IRON, RETICCTPCT in the last 72 hours. Urine analysis:    Component Value Date/Time   COLORURINE YELLOW (A) 09/23/2020 1722   APPEARANCEUR CLEAR (A) 09/23/2020 1722   LABSPEC 1.005 09/23/2020 1722   PHURINE 5.0 09/23/2020 1722   GLUCOSEU NEGATIVE 09/23/2020 1722   HGBUR SMALL (A) 09/23/2020 1722   BILIRUBINUR NEGATIVE 09/23/2020 1722   KETONESUR NEGATIVE  09/23/2020 1722   PROTEINUR NEGATIVE 09/23/2020 1722   NITRITE POSITIVE (A) 09/23/2020 1722   LEUKOCYTESUR NEGATIVE 09/23/2020 1722   Sepsis Labs: @LABRCNTIP (procalcitonin:4,lacticidven:4)  ) Recent Results (from the past 240 hour(s))  Urine culture     Status: Abnormal (Preliminary result)   Collection Time: 09/23/20  5:22 PM   Specimen: Urine, Random  Result Value Ref Range Status   Specimen Description   Final    URINE, RANDOM Performed at [facility], [address] [place], [place] [address]    Special Requests   Final    NONE Performed at [facility], [address], [place], [place] [address]    Culture (A)  Final    >=100,000 COLONIES/mL Lonell Grandchild NEGATIVE RODS SUSCEPTIBILITIES TO FOLLOW Performed at [facility], [place] [address] [place],  [address]    Report Status PENDING  Incomplete  Resp Panel by RT-PCR (Flu A&B, Covid) Nasopharyngeal Swab     Status: Abnormal   Collection Time: 09/23/20  6:48 PM   Specimen: Nasopharyngeal Swab; Nasopharyngeal(NP) swabs in vial transport medium  Result Value Ref Range  Status   SARS Coronavirus 2 by RT PCR POSITIVE (A) NEGATIVE Final    Comment: RESULT CALLED TO, READ BACK BY AND VERIFIED WITH: [place] 09/23/20 LFD (NOTE) SARS-CoV-2 target nucleic acids are DETECTED.  The SARS-CoV-2 RNA is generally detectable in upper respiratory specimens during the acute phase of infection. Positive results are indicative of the presence of the identified virus, but do not rule out bacterial infection or co-infection with other pathogens not detected by the test. Clinical correlation with patient history and other diagnostic information is necessary to determine patient infection status. The expected result is Negative.  Fact Sheet for Patients: EntrepreneurPulse.com.au  Fact Sheet for Healthcare Providers: IncredibleEmployment.be  This test is not yet approved  or cleared by the [place] FDA and  has been authorized for detection and/or diagnosis of SARS-CoV-2 by FDA under an Emergency Use Authorization (EUA).  This EUA will remain in effect (meaning this test can be use d) for the duration of  the COVID-19 declaration under Section 564(b)(1) of the Act, 21 U.S.C. section 360bbb-3(b)(1), unless the authorization is terminated or revoked sooner.     Influenza A by PCR NEGATIVE NEGATIVE Final   Influenza B by PCR NEGATIVE NEGATIVE Final    Comment: (NOTE) The Xpert Xpress SARS-CoV-2/FLU/RSV plus assay is intended as an aid in the diagnosis of influenza from Nasopharyngeal swab specimens and should not be used as a sole basis for treatment. Nasal washings and aspirates are unacceptable for Xpert Xpress SARS-CoV-2/FLU/RSV testing.  Fact Sheet for Patients: EntrepreneurPulse.com.au  Fact Sheet for Healthcare Providers: IncredibleEmployment.be  This test is not yet approved or cleared by the [place] FDA and has been authorized for detection and/or diagnosis of SARS-CoV-2 by FDA under an Emergency Use Authorization (EUA). This EUA will remain in effect (meaning this test can be used) for the duration of the COVID-19 declaration under Section 564(b)(1) of the Act, 21 U.S.C. section 360bbb-3(b)(1), unless the authorization is terminated or revoked.  Performed at [facility], [address] [place], [place] [address]       Studies: No results found.  Scheduled Meds: . acetaminophen  1,000 mg Oral Q6H  . benzonatate  200 mg Oral TID  . bumetanide  1 mg Oral q AM   And  . bumetanide  0.5 mg Oral QPM  . fluticasone  2 puff Inhalation BID  . gabapentin  200 mg Oral QHS  . insulin aspart  0-15 Units Subcutaneous TID WC  . insulin aspart  4 Units Subcutaneous TID WC  . insulin glargine  10 Units Subcutaneous Daily  . levalbuterol  2 puff Inhalation Q6H  . levothyroxine  125 mcg  Oral Daily  . magnesium oxide  400 mg Oral QID  . methylPREDNISolone (SOLU-MEDROL) injection  40 mg Intravenous Daily  . metoprolol succinate  50 mg Oral Daily  . mirabegron ER  25 mg Oral Daily  . montelukast  10 mg Oral QHS  . multivitamin-lutein  1 capsule Oral BID  . pantoprazole  40 mg Oral Daily  . triamcinolone cream   Topical TID    Continuous Infusions: . sodium chloride Stopped (09/24/20 1336)  . piperacillin-tazobactam (ZOSYN)  IV 3.375 g (09/25/20 0524)     LOS: 2 days     Annita Brod, MD [facility]   09/25/2020, 10:40 AM

## 2020-09-26 ENCOUNTER — Inpatient Hospital Stay: Payer: Medicare Other

## 2020-09-26 ENCOUNTER — Inpatient Hospital Stay (HOSPITAL_COMMUNITY)
Admit: 2020-09-26 | Discharge: 2020-09-26 | Disposition: A | Discharge status: Home / Self Care | Payer: Medicare Other | Attending: Internal Medicine | Admitting: Internal Medicine

## 2020-09-26 ENCOUNTER — Other Ambulatory Visit: Payer: Self-pay | Admitting: Radiology

## 2020-09-26 DIAGNOSIS — R194 Change in bowel habit: Secondary | ICD-10-CM

## 2020-09-26 DIAGNOSIS — I5031 Acute diastolic (congestive) heart failure: Secondary | ICD-10-CM

## 2020-09-26 DIAGNOSIS — K810 Acute cholecystitis: Secondary | ICD-10-CM

## 2020-09-26 LAB — ECHOCARDIOGRAM COMPLETE
AR max vel: 1.98 cm2
AV Area VTI: 2.19 cm2
AV Area mean vel: 2.39 cm2
AV Mean grad: 6.3 mmHg
AV Peak grad: 13.7 mmHg
Ao pk vel: 1.85 m/s
Area-P 1/2: 3.1 cm2
Height: 63 in
MV VTI: 1.6 cm2
S' Lateral: 2.2 cm

## 2020-09-26 LAB — BASIC METABOLIC PANEL
Anion gap: 10 (ref 5–15)
BUN: 29 mg/dL — ABNORMAL HIGH (ref 8–23)
CO2: 22 mmol/L (ref 22–32)
Calcium: 8.5 mg/dL — ABNORMAL LOW (ref 8.9–10.3)
Chloride: 96 mmol/L — ABNORMAL LOW (ref 98–111)
Creatinine, Ser: 1.02 mg/dL — ABNORMAL HIGH (ref 0.44–1.00)
GFR, Estimated: 56 mL/min — ABNORMAL LOW (ref >60)
Glucose, Bld: 150 mg/dL — ABNORMAL HIGH (ref 70–99)
Potassium: 5.4 mmol/L — ABNORMAL HIGH (ref 3.5–5.1)
Sodium: 128 mmol/L — ABNORMAL LOW (ref 135–145)

## 2020-09-26 LAB — CBC
HCT: 30.8 % — ABNORMAL LOW (ref 36.0–46.0)
Hemoglobin: 10.4 g/dL — ABNORMAL LOW (ref 12.0–15.0)
MCH: 29.4 pg (ref 26.0–34.0)
MCHC: 33.8 g/dL (ref 30.0–36.0)
MCV: 87 fL (ref 80.0–100.0)
Platelets: 376 10*3/uL (ref 150–400)
RBC: 3.54 MIL/uL — ABNORMAL LOW (ref 3.87–5.11)
RDW: 15 % (ref 11.5–15.5)
WBC: 26.9 10*3/uL — ABNORMAL HIGH (ref 4.0–10.5)
nRBC: 0 % (ref 0.0–0.2)

## 2020-09-26 LAB — HEMOGLOBIN A1C
Hgb A1c MFr Bld: 5.9 % — ABNORMAL HIGH (ref 4.8–5.6)
Hgb A1c MFr Bld: 6.1 % — ABNORMAL HIGH (ref 4.8–5.6)
Mean Plasma Glucose: 123 mg/dL
Mean Plasma Glucose: 128 mg/dL

## 2020-09-26 LAB — PATHOLOGIST SMEAR REVIEW

## 2020-09-26 LAB — GLUCOSE, CAPILLARY
Glucose-Capillary: 142 mg/dL — ABNORMAL HIGH (ref 70–99)
Glucose-Capillary: 144 mg/dL — ABNORMAL HIGH (ref 70–99)
Glucose-Capillary: 165 mg/dL — ABNORMAL HIGH (ref 70–99)
Glucose-Capillary: 206 mg/dL — ABNORMAL HIGH (ref 70–99)

## 2020-09-26 LAB — URINE CULTURE: Culture: >=100000 — AB

## 2020-09-26 MED ORDER — ONDANSETRON HCL 4 MG/2ML IJ SOLN
INTRAMUSCULAR | Status: AC
  Filled 2020-09-26: qty 2

## 2020-09-26 MED ORDER — SPIRONOLACTONE 25 MG PO TABS: 100.0000 mg | ORAL_TABLET | Freq: Two times a day (BID) | ORAL | Status: DC

## 2020-09-26 MED ORDER — FENTANYL CITRATE (PF) 100 MCG/2ML IJ SOLN
INTRAMUSCULAR | Status: AC | PRN
  Administered 2020-09-26 (×2): 50 ug via INTRAVENOUS

## 2020-09-26 MED ORDER — SODIUM CHLORIDE 0.9 % IV SOLN: INTRAVENOUS | Status: AC

## 2020-09-26 MED ORDER — MIDAZOLAM HCL 2 MG/2ML IJ SOLN
INTRAMUSCULAR | Status: AC | PRN
  Administered 2020-09-26 (×2): 1 mg via INTRAVENOUS

## 2020-09-26 MED ORDER — FENTANYL CITRATE (PF) 100 MCG/2ML IJ SOLN
INTRAMUSCULAR | Status: AC
  Filled 2020-09-26: qty 2

## 2020-09-26 MED ORDER — ONDANSETRON HCL 4 MG/2ML IJ SOLN: 4.0000 mg | Freq: Once | INTRAMUSCULAR | Status: DC

## 2020-09-26 MED ORDER — ONDANSETRON HCL 4 MG/2ML IJ SOLN
INTRAMUSCULAR | Status: AC | PRN
  Administered 2020-09-26: 4 mg via INTRAVENOUS

## 2020-09-26 MED ORDER — MIDAZOLAM HCL 2 MG/2ML IJ SOLN
INTRAMUSCULAR | Status: AC
  Filled 2020-09-26: qty 2

## 2020-09-26 MED ORDER — BUMETANIDE 1 MG PO TABS
4.0000 mg | ORAL_TABLET | Freq: Every evening | ORAL | Status: DC
  Administered 2020-09-26: 4 mg via ORAL
  Filled 2020-09-26 (×2): qty 4

## 2020-09-26 MED ORDER — BUMETANIDE 1 MG PO TABS
4.0000 mg | ORAL_TABLET | Freq: Every morning | ORAL | Status: DC
  Administered 2020-09-27: 4 mg via ORAL
  Filled 2020-09-26: qty 4

## 2020-09-26 MED ORDER — VANCOMYCIN HCL IN DEXTROSE 1-5 GM/200ML-% IV SOLN: 1000.0000 mg | Freq: Once | INTRAVENOUS | Status: DC

## 2020-09-26 MED ORDER — SODIUM CHLORIDE 0.9 % IV SOLN
12.5000 mg | Freq: Four times a day (QID) | INTRAVENOUS | Status: DC | PRN
  Filled 2020-09-26: qty 0.5

## 2020-09-26 NOTE — Progress Notes (Signed)
*  PRELIMINARY RESULTS* Echocardiogram 2D Echocardiogram has been performed.  Gina Chambers 09/26/2020, 8:54 AM

## 2020-09-26 NOTE — Progress Notes (Signed)
Gallbladder drain with 150 ml brown output at this time.

## 2020-09-26 NOTE — Procedures (Signed)
Interventional Radiology Procedure Note  Procedure: Cholecystostomy drain placement Indication: Cholecystitis  Findings: Please refer to procedural dictation for full description.  Complications: None  EBL: < 10 mL  Miachel Roux, MD [phone]

## 2020-09-26 NOTE — Progress Notes (Signed)
Patient called out to report that she had a "sample" in her toilet. Noted small amount of clear substance that was bubbly in appearance floating on top of the water.

## 2020-09-26 NOTE — Progress Notes (Addendum)
[facility] SURGICAL PROGRESS NOTE (cpt [phone])  Hospital Day(s): 3.  Interval History: Patient seen and examined, no acute events or new complaints overnight. Patient reports she is feeing a little better this morning from an abdominal standpoint. RUQ pain improved. No fever, chills, nausea, emesis. CBC pending. BMP with hyponatremia to 128, hyperkalemia to 5.4, sCr - 1.02 . Plan for IR placement of percutaneous cholecystostomy tube today; however, pending [place] per IR request  Interestingly, she reports that overnight she had multiple bowel movements which she is describing as "orange, transparent jelly." She states "it looks like someone made orange jello and squeezed it out a tube." No blood, no diarrhea.   Review of Systems:  Constitutional: denies fever, chills  HEENT: denies cough or congestion  Respiratory: denies any shortness of breath  Cardiovascular: denies chest pain or palpitations  Gastrointestinal: + abdominal pain (improving), denied N/V, or diarrhea/and bowel function as per interval history Genitourinary: denies burning with urination or urinary frequency  Vital signs in last 24 hours: [min-max] current  Temp:  [97.8 F (36.6 C)-98.9 F (37.2 C)] 98.7 F (37.1 C) (06/06 0410) Pulse Rate:  [75-86] 75 (06/06 0410) Resp:  [16-18] 18 (06/06 0410) BP: (125-154)/(62-104) 125/104 (06/06 0410) SpO2:  [99 %-100 %] 100 % (06/06 0410)     Height: 5\' 3"  (160 cm)       Intake/Output last 2 shifts:  06/05 0701 - 06/06 0700 In: 28.3 [I.V.:28.3] Out: -    Physical Exam:  Constitutional: alert, cooperative and no distress  HENT: normocephalic without obvious abnormality  Eyes: PERRL, EOM's grossly intact and symmetric  Respiratory: breathing non-labored at rest  Cardiovascular: regular rate and sinus rhythm  Gastrointestinal: Soft, she does appear tender with deep palpation in the RUQ, and non-distended, no rebound/guarding  Musculoskeletal: no edema or wounds,  motor and sensation grossly intact, NT    Labs:  CBC Latest Ref Rng & Units 09/25/2020 09/24/2020 09/23/2020  WBC 4.0 - 10.5 K/uL 34.2(H) 28.0(H) 28.3(H)  Hemoglobin 12.0 - 15.0 g/dL 10.5(L) 10.0(L) 9.5(L)  Hematocrit 36.0 - 46.0 % 31.5(L) 29.4(L) 28.1(L)  Platelets 150 - 400 K/uL 279 236 266   CMP Latest Ref Rng & Units 09/26/2020 09/25/2020 09/24/2020  Glucose 70 - 99 mg/dL 150(H) 187(H) 136(H)  BUN 8 - 23 mg/dL 29(H) 24(H) 17  Creatinine 0.44 - 1.00 mg/dL 1.02(H) 1.07(H) 1.00  Sodium 135 - 145 mmol/L 128(L) 129(L) 128(L)  Potassium 3.5 - 5.1 mmol/L 5.4(H) 4.2 3.6  Chloride 98 - 111 mmol/L 96(L) 93(L) 94(L)  CO2 22 - 32 mmol/L 22 25 26   Calcium 8.9 - 10.3 mg/dL 8.5(L) 8.6(L) 8.0(L)  Total Protein 6.5 - 8.1 g/dL - 6.4(L) 5.6(L)  Total Bilirubin 0.3 - 1.2 mg/dL - 0.9 1.4(H)  Alkaline Phos 38 - 126 U/L - 108 87  AST 15 - 41 U/L - 63(H) 61(H)  ALT 0 - 44 U/L - 39 35    Imaging studies:  RUQ [place] (09/26/2020) pending   Assessment/Plan: (ICD-10's: K81.0) 80 y.o. female with cholecystitis, complicated by pertinent comorbidities including COVD+.   - Agree with plan for IR evaluation for percutaneous cholecystostomy; hopefully today. She is a sub-optimal surgical candidate in this setting. Discussed with them, procedure is pending repeat RUQ [place]  - NPO for possible procedure; okay to resume diet following this  - Continue IV ABx (Zosyn)  - Correct electrolytes   - Monitor abdominal pain; on-going bowel function; Unsure what to make of her "orange jelly stools" overnight   -  Pain control prn; antiemetics prn  - Further management per primary service; we will follow    All of the above findings and recommendations were discussed with the patient, and the medical team, and all of patient's questions were answered to her expressed satisfaction.   -- Edison Simon, PA-C [facility] 09/26/2020, 7:52 AM [phone] M-F: 7am - 4pm   Percutaneous cholecystostomy tube placed  today.  Purulent bile aspirated and sent for culture.  Upon discharge, she will need follow-up in the drain clinic for tube cholangiogram to determine whether or not it can be capped and removed. I saw and evaluated the patient.  I agree with the above documentation, exam, and plan, which I have edited where appropriate. Fredirick Maudlin  7:42 PM

## 2020-09-26 NOTE — Progress Notes (Signed)
OT Cancellation Note  Patient Details Name: Gina Chambers MRN: [id number] DOB: 1941/03/15   Cancelled Treatment:    Reason Eval/Treat Not Completed: Patient at procedure or test/ unavailable. Order received, chart reviewed.  Pt's potassium noted to be elevated to 5.4 this morning; per rehab guidelines exertional activity contra-indicated.  Also, per chart, plan for possible procedure today.  Will hold at this time and re-attempt OT evaluation at a later date/time as medically appropriate.  Dessie Coma, M.S. OTR/L  09/26/20, 1:16 PM  ascom [phone]

## 2020-09-26 NOTE — Progress Notes (Addendum)
PROGRESS NOTE  Gina Chambers XVQ:[id number] DOB: 1940-11-01 DOA: 09/23/2020 PCP: Kirk Ruths, MD  HPI/Recap of past 15 hours: 80 year old female with past medical history of chronic diastolic heart failure, hypertension, diabetes mellitus and COPD plus hypothyroidism and recent COVID infection on 5/25 presented to the emergency room on 6/3 after being sent over by PCP for persistent leukocytosis despite completing antibiotic therapy for left inguinal crease abscess and patient was admitted for acute cholecystitis.  Placed on Zosyn on admission.  Hospitalists were consulted as patient continued to complain of shortness of breath.  Patient felt to be in acute COPD exacerbation.  Started on Solu-Medrol, continued on nebulizers.  Lantus added for hyperglycemia and now patient is on room air.  Given breathing and COVID issues, cholecystectomy is delayed at this time with plans for placement of a percutaneous drain by IR today.  Patient seen soon after drainage tube placed.  Minimal pain.  She says her breathing is okay.  She is tired.  Assessment/Plan: Principal Problem:   COPD exacerbation ([place]): Much improved.  Patient is on room air.  Continue steroids and nebulizers. Active Problems:   Leukocytosis: Waiting for final results of peripheral smear evaluation on 6/3.  Normal white blood cell count 2 years ago noted.  Patient had recent completed antibiotics.  Increase in white count today from admission likely in the setting of steroids as well as ongoing cholecystitis.  On Zosyn.  Will benefit from drain.  Recheck labs in the morning    Acquired hypothyroidism: Continue Synthroid.  Acute on chronic diastolic heart failure with preserved left ventricular function (HFpEF) ([place]): Echocardiogram done 9/20.  BNP elevated at 629.  Refused Lasix, so greatly increased Bumex    Essential hypertension: Continue home medications.  Blood pressure stable, although diastolic high likely from fluid.     Cholecystitis, acute: As above.  Plans for percutaneous drainage tube to be placed until patient better stabilizes for cholecystectomy.  On Zosyn for gut coverage.    COVID-19 virus infection: Patient completed outpatient paxlovid.  On room air.  Noted CRP level of 22.  Will continue to monitor and if starts to rise, will add Remdisivir.  Note that pt is now 11 days out from diagnosis.    Type 2 diabetes mellitus without complication ([place]): Started back on Lantus.  Plus sliding scale.  Will check A1c.    Hyponatremia: Likely from both dehydration and dilutional from CHF   Code Status: Full code  Family Communication: updated daughter by phone  Disposition Plan: Once gallbladder and CHF, COVID and COPD issues stabilized, then can go home   Consultants:  Hospitalists initially, but given multiple acute medical problems, patient changed to hospitalist service as attending with surgery consulting  Procedures:  percutaneous drainage tube of gallbladder done 6/6  Antimicrobials:  IV Zosyn 6/3-present  DVT prophylaxis: SCDs  Level of care: Med-Surg   Objective: Vitals:   09/26/20 0410 09/26/20 0945  BP: (!) 125/104 (!) 131/57  Pulse: 75 76  Resp: 18 20  Temp: 98.7 F (37.1 C) 98.6 F (37 C)  SpO2: 100% 98%    Intake/Output Summary (Last 24 hours) at 09/26/2020 1047 Last data filed at 09/26/2020 0653 Gross per 24 hour  Intake 28.31 ml  Output --  Net 28.31 ml   There were no vitals filed for this visit. Body mass index is 34.19 kg/m.  Exam:   General: Alert and oriented x3, no acute distress  HEENT: Normocephalic and atraumatic, mucous membranes slightly  dry  Neck: Thick, narrow airway  Cardiovascular: Regular rate and rhythm, S1-S2  Respiratory: Decreased breath sounds throughout  Abdomen: Soft, drain placed in the right upper quadrant, nondistended, positive bowel sounds  Musculoskeletal: No clubbing or cyanosis or edema  Skin: No skin breaks, tears or  lesions  Psychiatry: Appropriate, no evidence of psychoses  Neurology: No focal deficits   Data Reviewed: CBC: Recent Labs  Lab 09/23/20 1636 09/24/20 0513 09/25/20 0529  WBC 28.3* 28.0* 34.2*  NEUTROABS 17.3*  --   --   HGB 9.5* 10.0* 10.5*  HCT 28.1* 29.4* 31.5*  MCV 87.8 88.8 88.7  PLT 266 236 174   Basic Metabolic Panel: Recent Labs  Lab 09/23/20 1636 09/24/20 0513 09/25/20 0529 09/26/20 0443  NA 125* 128* 129* 128*  K 3.9 3.6 4.2 5.4*  CL 90* 94* 93* 96*  CO2 25 26 25 22   GLUCOSE 152* 136* 187* 150*  BUN 16 17 24* 29*  CREATININE 0.93 1.00 1.07* 1.02*  CALCIUM 8.2* 8.0* 8.6* 8.5*  MG  --  1.8 2.2  --   PHOS  --  3.7 4.3  --    GFR: CrCl cannot be calculated (Unknown ideal weight.). Liver Function Tests: Recent Labs  Lab 09/23/20 1636 09/24/20 0513 09/25/20 0529  AST 72* 61* 63*  ALT 39 35 39  ALKPHOS 106 87 108  BILITOT 1.3* 1.4* 0.9  PROT 5.8* 5.6* 6.4*  ALBUMIN 2.7* 2.4* 2.7*   No results for input(s): LIPASE, AMYLASE in the last 168 hours. No results for input(s): AMMONIA in the last 168 hours. Coagulation Profile: Recent Labs  Lab 09/25/20 0529  INR 1.3*   Cardiac Enzymes: No results for input(s): CKTOTAL, CKMB, CKMBINDEX, TROPONINI in the last 168 hours. BNP (last 3 results) No results for input(s): PROBNP in the last 8760 hours. HbA1C: No results for input(s): HGBA1C in the last 72 hours. CBG: Recent Labs  Lab 09/25/20 0825 09/25/20 1201 09/25/20 1648 09/25/20 2059 09/26/20 0736  GLUCAP 186* 146* 196* 245* 142*   Lipid Profile: No results for input(s): CHOL, HDL, LDLCALC, TRIG, CHOLHDL, LDLDIRECT in the last 72 hours. Thyroid Function Tests: No results for input(s): TSH, T4TOTAL, FREET4, T3FREE, THYROIDAB in the last 72 hours. Anemia Panel: No results for input(s): VITAMINB12, FOLATE, FERRITIN, TIBC, IRON, RETICCTPCT in the last 72 hours. Urine analysis:    Component Value Date/Time   COLORURINE YELLOW (A) 09/23/2020  1722   APPEARANCEUR CLEAR (A) 09/23/2020 1722   LABSPEC 1.005 09/23/2020 1722   PHURINE 5.0 09/23/2020 1722   GLUCOSEU NEGATIVE 09/23/2020 1722   HGBUR SMALL (A) 09/23/2020 1722   BILIRUBINUR NEGATIVE 09/23/2020 1722   KETONESUR NEGATIVE 09/23/2020 1722   PROTEINUR NEGATIVE 09/23/2020 1722   NITRITE POSITIVE (A) 09/23/2020 1722   LEUKOCYTESUR NEGATIVE 09/23/2020 1722   Sepsis Labs: @LABRCNTIP (procalcitonin:4,lacticidven:4)  ) Recent Results (from the past 240 hour(s))  Urine culture     Status: Abnormal   Collection Time: 09/23/20  5:22 PM   Specimen: Urine, Random  Result Value Ref Range Status   Specimen Description   Final    URINE, RANDOM Performed at [facility], [address] [place], [place] [address]    Special Requests   Final    NONE Performed at [facility], [address] [place], [place] [address]    Culture >=100,000 COLONIES/mL ESCHERICHIA COLI (A)  Final   Report Status 09/26/2020 FINAL  Final   Organism ID, Bacteria ESCHERICHIA COLI (A)  Final  Susceptibility   Escherichia coli - MIC*    AMPICILLIN <=2 SENSITIVE Sensitive     CEFAZOLIN <=4 SENSITIVE Sensitive     CEFEPIME <=0.12 SENSITIVE Sensitive     CEFTRIAXONE <=0.25 SENSITIVE Sensitive     CIPROFLOXACIN <=0.25 SENSITIVE Sensitive     GENTAMICIN <=1 SENSITIVE Sensitive     IMIPENEM 1 SENSITIVE Sensitive     NITROFURANTOIN <=16 SENSITIVE Sensitive     TRIMETH/SULFA <=20 SENSITIVE Sensitive     AMPICILLIN/SULBACTAM <=2 SENSITIVE Sensitive     PIP/TAZO <=4 SENSITIVE Sensitive     * >=100,000 COLONIES/mL ESCHERICHIA COLI  Resp Panel by RT-PCR (Flu A&B, Covid) Nasopharyngeal Swab     Status: Abnormal   Collection Time: 09/23/20  6:48 PM   Specimen: Nasopharyngeal Swab; Nasopharyngeal(NP) swabs in vial transport medium  Result Value Ref Range Status   SARS Coronavirus 2 by RT PCR POSITIVE (A) NEGATIVE Final    Comment: RESULT CALLED TO, READ BACK BY AND VERIFIED  WITH: [place] 09/23/20 LFD (NOTE) SARS-CoV-2 target nucleic acids are DETECTED.  The SARS-CoV-2 RNA is generally detectable in upper respiratory specimens during the acute phase of infection. Positive results are indicative of the presence of the identified virus, but do not rule out bacterial infection or co-infection with other pathogens not detected by the test. Clinical correlation with patient history and other diagnostic information is necessary to determine patient infection status. The expected result is Negative.  Fact Sheet for Patients: EntrepreneurPulse.com.au  Fact Sheet for Healthcare Providers: IncredibleEmployment.be  This test is not yet approved or cleared by the [place] FDA and  has been authorized for detection and/or diagnosis of SARS-CoV-2 by FDA under an Emergency Use Authorization (EUA).  This EUA will remain in effect (meaning this test can be use d) for the duration of  the COVID-19 declaration under Section 564(b)(1) of the Act, 21 U.S.C. section 360bbb-3(b)(1), unless the authorization is terminated or revoked sooner.     Influenza A by PCR NEGATIVE NEGATIVE Final   Influenza B by PCR NEGATIVE NEGATIVE Final    Comment: (NOTE) The Xpert Xpress SARS-CoV-2/FLU/RSV plus assay is intended as an aid in the diagnosis of influenza from Nasopharyngeal swab specimens and should not be used as a sole basis for treatment. Nasal washings and aspirates are unacceptable for Xpert Xpress SARS-CoV-2/FLU/RSV testing.  Fact Sheet for Patients: EntrepreneurPulse.com.au  Fact Sheet for Healthcare Providers: IncredibleEmployment.be  This test is not yet approved or cleared by the [place] FDA and has been authorized for detection and/or diagnosis of SARS-CoV-2 by FDA under an Emergency Use Authorization (EUA). This EUA will remain in effect (meaning this test can be used) for  the duration of the COVID-19 declaration under Section 564(b)(1) of the Act, 21 U.S.C. section 360bbb-3(b)(1), unless the authorization is terminated or revoked.  Performed at [facility], [address] [place], [place] [address]       Studies: ECHOCARDIOGRAM COMPLETE  Result Date: 09/26/2020    ECHOCARDIOGRAM REPORT   Patient Name:   ANJEL PERFETTI Date of Exam: 09/26/2020 Medical Rec #:  [id number]     Height:       63.0 in Accession #:    [id number]    Weight:       193.0 lb Date of Birth:  10-02-1940    BSA:          1.905 m Patient Age:    27 years      BP:  125/104 mmHg Patient Gender: F             HR:           75 bpm. Exam Location:  [facility] Procedure: 2D Echo, Cardiac Doppler and Color Doppler Indications:     CHF-acute diastolic L79.89  History:         Patient has prior history of Echocardiogram examinations, most                  recent 12/24/2018. Risk Factors:Hypertension and Diabetes. DVT.  Sonographer:     Sherrie Sport RDCS (AE) Referring Phys:  [place] Diagnosing Phys: Kathlyn Sacramento MD  Sonographer Comments: Suboptimal apical window and suboptimal parasternal window. IMPRESSIONS  1. Left ventricular ejection fraction, by estimation, is 55 to 60%. The left ventricle has normal function. The left ventricle has no regional wall motion abnormalities. There is mild left ventricular hypertrophy. Left ventricular diastolic parameters are indeterminate.  2. Right ventricular systolic function is normal. The right ventricular size is normal. There is moderately elevated pulmonary artery systolic pressure. The estimated right ventricular systolic pressure is 21.1 mmHg.  3. Left atrial size was moderately dilated.  4. Right atrial size was mildly dilated.  5. The mitral valve has been repaired/replaced. No evidence of mitral valve regurgitation. Mild mitral stenosis. Moderate mitral annular calcification. Echo findings are consistent with normal structure and function  of the mitral valve prosthesis.  6. The aortic valve has been repaired/replaced. Aortic valve regurgitation is not visualized. Mild to moderate aortic valve sclerosis/calcification is present, without any evidence of aortic stenosis. Echo findings are consistent with normal structure and function of the aortic valve prosthesis. FINDINGS  Left Ventricle: Left ventricular ejection fraction, by estimation, is 55 to 60%. The left ventricle has normal function. The left ventricle has no regional wall motion abnormalities. The left ventricular internal cavity size was normal in size. There is  mild left ventricular hypertrophy. Left ventricular diastolic parameters are indeterminate. Right Ventricle: The right ventricular size is normal. No increase in right ventricular wall thickness. Right ventricular systolic function is normal. There is moderately elevated pulmonary artery systolic pressure. The tricuspid regurgitant velocity is 3.26 m/s, and with an assumed right atrial pressure of 5 mmHg, the estimated right ventricular systolic pressure is 94.1 mmHg. Left Atrium: Left atrial size was moderately dilated. Right Atrium: Right atrial size was mildly dilated. Pericardium: There is no evidence of pericardial effusion. Mitral Valve: The mitral valve has been repaired/replaced. There is moderate thickening of the mitral valve leaflet(s). There is severe calcification of the mitral valve leaflet(s). Moderate mitral annular calcification. No evidence of mitral valve regurgitation. 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 Echo findings are consistent with normal structure and function of the mitral valve prosthesis. Mild mitral valve stenosis. MV peak gradient, 24.6 mmHg. The mean mitral valve gradient is 8.0 mmHg. Tricuspid Valve: The tricuspid valve is normal in structure. Tricuspid valve regurgitation is mild . No evidence of tricuspid stenosis. Aortic Valve: The aortic valve has been  repaired/replaced. Aortic valve regurgitation is not visualized. Mild to moderate aortic valve sclerosis/calcification is present, without any evidence of aortic stenosis. Aortic valve mean gradient measures 6.3 mmHg. Aortic valve peak gradient measures 13.7 mmHg. Aortic valve area, by VTI measures 2.19 cm. 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 Echo findings are consistent with normal structure and function of the aortic valve prosthesis. Pulmonic Valve: The pulmonic valve was normal in  structure. Pulmonic valve regurgitation is not visualized. No evidence of pulmonic stenosis. Aorta: The aortic root is normal in size and structure. Venous: The inferior vena cava was not well visualized. IAS/Shunts: No atrial level shunt detected by color flow Doppler.  LEFT VENTRICLE PLAX 2D LVIDd:         3.92 cm  Diastology LVIDs:         2.20 cm  LV e' medial:    5.22 cm/s LV PW:         1.25 cm  LV E/e' medial:  42.0 LV IVS:        1.25 cm  LV e' lateral:   5.00 cm/s LVOT diam:     2.00 cm  LV E/e' lateral: 43.8 LV SV:         92 LV SV Index:   48 LVOT Area:     3.14 cm  RIGHT VENTRICLE RV S prime:     9.03 cm/s TAPSE (M-mode): 4.1 cm LEFT ATRIUM             Index       RIGHT ATRIUM           Index LA diam:        4.40 cm 2.31 cm/m  RA Area:     20.70 cm LA Vol (A2C):   92.6 ml 48.62 ml/m RA Volume:   65.50 ml  34.39 ml/m LA Vol (A4C):   80.2 ml 42.11 ml/m LA Biplane Vol: 86.0 ml 45.15 ml/m  AORTIC VALVE                    PULMONIC VALVE AV Area (Vmax):    1.98 cm     PV Vmax:        0.36 m/s AV Area (Vmean):   2.39 cm     PV Peak grad:   0.5 mmHg AV Area (VTI):     2.19 cm     RVOT Peak grad: 2 mmHg AV Vmax:           185.33 cm/s AV Vmean:          117.000 cm/s AV VTI:            0.421 m AV Peak Grad:      13.7 mmHg AV Mean Grad:      6.3 mmHg LVOT Vmax:         117.00 cm/s LVOT Vmean:        89.000 cm/s LVOT VTI:          0.293 m LVOT/AV VTI ratio: 0.70  AORTA Ao Root diam: 2.70 cm MITRAL  VALVE                TRICUSPID VALVE MV Area (PHT): 3.10 cm     TR Peak grad:   42.5 mmHg MV Area VTI:   1.60 cm     TR Vmax:        326.00 cm/s MV Peak grad:  24.6 mmHg MV Mean grad:  8.0 mmHg     SHUNTS MV Vmax:       2.48 m/s     Systemic VTI:  0.29 m MV Vmean:      124.0 cm/s   Systemic Diam: 2.00 cm MV Decel Time: 245 msec MV E velocity: 219.00 cm/s MV A velocity: 102.00 cm/s MV E/A ratio:  2.15 Kathlyn Sacramento MD Electronically signed by Kathlyn Sacramento MD Signature Date/Time: 09/26/2020/10:46:08 AM    Final  US Abdomen Limited RUQ (LIVER/GB)  Result Date: 09/26/2020 CLINICAL DATA:  Cholecystitis EXAM: ULTRASOUND ABDOMEN LIMITED RIGHT UPPER QUADRANT COMPARISON:  CT abdomen pelvis 09/23/2020 FINDINGS: Gallbladder: Dilated gallbladder filled with layering sludge and multiple gallstones. There is wall thickening, pericholecystic fluid, and a positive sonographic Murphy sign. Largest gallstone measures 9 mm. Common bile duct: Diameter: 3.1 mm Liver: No focal lesion identified. Within normal limits in parenchymal echogenicity. Portal vein is patent on color Doppler imaging with normal direction of blood flow towards the liver. Other: Mild perinephric fluid along the right kidney as seen on recent CT. IMPRESSION: Findings consistent with acute cholecystitis. Electronically Signed   By: Maurine Simmering   On: 09/26/2020 09:50    Scheduled Meds: . acetaminophen  1,000 mg Oral Q6H  . benzonatate  200 mg Oral TID  . bumetanide  1 mg Oral q AM   And  . bumetanide  0.5 mg Oral QPM  . fluticasone  2 puff Inhalation BID  . furosemide  20 mg Intravenous BID  . gabapentin  200 mg Oral QHS  . insulin aspart  0-15 Units Subcutaneous TID WC  . insulin aspart  4 Units Subcutaneous TID WC  . insulin glargine  10 Units Subcutaneous Daily  . levalbuterol  2 puff Inhalation Q6H  . levothyroxine  125 mcg Oral Daily  . magnesium oxide  400 mg Oral QID  . methylPREDNISolone (SOLU-MEDROL) injection  40 mg Intravenous  Daily  . metoprolol succinate  50 mg Oral Daily  . mirabegron ER  25 mg Oral Daily  . montelukast  10 mg Oral QHS  . multivitamin-lutein  1 capsule Oral BID  . pantoprazole  40 mg Oral Daily  . triamcinolone cream   Topical TID    Continuous Infusions: . sodium chloride 75 mL/hr at 09/26/20 0653  . piperacillin-tazobactam (ZOSYN)  IV 12.5 mL/hr at 09/26/20 0653     LOS: 3 days     Annita Brod, MD [facility]   09/26/2020, 10:47 AM

## 2020-09-26 NOTE — H&P (Signed)
Chief Complaint:  Acute cholecystitis. Request is for cholecystomy tube placement.   Referring Physician(s): Dr. Bearl Mulberry  Supervising Physician: Mir, Sharen Heck  Patient Status: [facility] - In-pt  History of Present Illness: Gina Chambers is a 80 y.o. 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 Diagnosed on 5.25.22 with COVID ( on Paxloid). Presented to the ED at [facility] with rising WBC found as outpatient abdominal pain and diarrhea.  CT abdomen pelvis  from 6.3.22 ordered do to concern for draining inguinal abscess a distended gallbladder. CT abd pelvis from 6.3.22 reads Findings consistent with acute cholecystitis. [place] from 6.6.22 reads Dilated gallbladder filled with layering sludge and multiple gallstones. There is wall thickening, pericholecystic fluid, and a positive sonographic Murphy sign. Largest gallstone measures 9 mm. Patient is not a surgical candidate at this time.  Team is requesting a cholecystostomy tube placement.  Currently without any significant complaints. Patient alert sitting on the side of the bed. She is tearful. Stating she does not want a drain with a bag. She states that she does "not even feel bad. The only reason I am in here is because of my lab work.  Denies any fevers, headache, chest pain,abdominal pain. She states she has had intermittent nausea, vomiting prior to her admission that was "not bad". Patient called her daughter Gina Chambers on speaker phone. Return precautions and treatment recommendations and follow-up discussed with the patient and her daughter are agreeable with the plan.     Past Medical History:  Diagnosis Date  . Asthma   . Asthmatic bronchitis , chronic ([place])   . CHF (congestive heart failure) ([place])   . Diabetes mellitus without complication ([place])   . 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 last one approx 2013  . Family history of adverse reaction to anesthesia    Mother - temporary paralysis of 1 side  .  GERD (gastroesophageal reflux disease)   . Hypertension   . Neuropathy    feet  . PONV (postoperative nausea and vomiting)    Pt reports violent vomiting with ANY pain meds given with anesthesia.  . Sleep apnea    CPAP  . Thyroid disease   . Vertigo    daily    Past Surgical History:  Procedure Laterality Date  . CARDIAC SURGERY    . CATARACT EXTRACTION W/PHACO Right 03/11/2017   Procedure: CATARACT EXTRACTION PHACO AND INTRAOCULAR LENS PLACEMENT ([place])  RIGHT DIABETIC;  Surgeon: Eulogio Bear, MD;  Location: [place];  Service: Ophthalmology;  Laterality: Right;  Diabetic - insulin sleep apnea  . CATARACT EXTRACTION W/PHACO Left 04/02/2017   Procedure: CATARACT EXTRACTION PHACO AND INTRAOCULAR LENS PLACEMENT ([place]) LEFT DIABETES;  Surgeon: Eulogio Bear, MD;  Location: [place];  Service: Ophthalmology;  Laterality: Left;  Diabetic - insulin  . COLONOSCOPY WITH PROPOFOL N/A 11/09/2016   Procedure: COLONOSCOPY WITH PROPOFOL;  Surgeon: Jonathon Bellows, MD;  Location: [facility] ENDOSCOPY;  Service: Gastroenterology;  Laterality: N/A;  . HERNIA REPAIR    . VERTICAL BANDED GASTROPLASTY      Allergies: Ropinirole, Azelastine, Bupropion, Calcium, Carbidopa-levodopa, Cefuroxime, Cephalexin, Clinoril [sulindac], Codeine, Duloxetine, Duloxetine hcl, Ezetimibe, Fenofibrate, Furosemide, Glipizide, Lorazepam, Lortab [hydrocodone-acetaminophen], Metaproterenol, Morphine and related, Nalbuphine, Naproxen, Norfloxacin, Rofecoxib, Sodium, Statins, Sulfa antibiotics, Sulfasalazine, Tramadol, Trazodone and nefazodone, Doxycycline, Iodine, Ketoprofen, Piroxicam, and Tolmetin  Medications: Prior to Admission medications   Medication Sig Start Date End Date Taking? Authorizing Provider  aspirin EC 81  MG tablet Take 81 mg by mouth at bedtime.   Yes [provider]  bumetanide (BUMEX) 0.5 MG tablet Take 0.5-1 mg by mouth See admin instructions. Take 2 tablets (1mg ) in the  morning and 1 tablet (0.5 mg) in the evening   Yes [provider]  Chromium Picolinate 200 MCG CAPS Take 200 mcg by mouth daily.   Yes [provider]  diphenhydrAMINE (BENADRYL) 25 mg capsule Take 25 mg by mouth every 6 (six) hours as needed. 04/27/19  Yes [provider]  fluticasone (FLOVENT HFA) 110 MCG/ACT inhaler Inhale 2 puffs into the lungs 2 (two) times daily.    Yes [provider]  gabapentin (NEURONTIN) 100 MG capsule Take 200 mg by mouth at bedtime. 02/18/20 02/17/21 Yes [provider]  insulin glargine (LANTUS) 100 UNIT/ML injection Inject 20 Units into the skin See admin instructions. Takes 20 units in the morning,Takes lantus at night based on sliding scale   Yes [provider]  insulin regular (NOVOLIN R,HUMULIN R) 250 units/2.41mL (100 units/mL) injection Inject 10 Units into the skin 4 (four) times daily. Only uses Sliding scale at night   Yes [provider]  Ipratropium-Albuterol (COMBIVENT) 20-100 MCG/ACT AERS respimat Inhale 1 puff into the lungs 4 (four) times daily as needed for wheezing or shortness of breath.   Yes [provider]  lansoprazole (PREVACID) 30 MG capsule Take 30 mg by mouth 2 (two) times daily.   Yes [provider]  levalbuterol Penne Lash) 0.63 MG/3ML nebulizer solution Inhale 0.63 mg into the lungs every 6 (six) hours as needed. 04/27/19  Yes [provider]  levothyroxine (SYNTHROID, LEVOTHROID) 125 MCG tablet Take 125 mcg by mouth daily.   Yes [provider]  Magnesium 500 MG TABS Take 500 mg by mouth 4 (four) times daily.    Yes [provider]  metoprolol succinate (TOPROL-XL) 50 MG 24 hr tablet Take 50 mg by mouth daily. Take with or immediately following a meal.   Yes [provider]  montelukast (SINGULAIR) 10 MG tablet Take 10 mg by mouth at bedtime.   Yes [provider]  Multiple Vitamins-Minerals (PRESERVISION AREDS 2 PO) Take  1 tablet by mouth 2 (two) times daily.   Yes [provider]  MYRBETRIQ 25 MG TB24 tablet Take 25 mg by mouth daily. 12/21/19  Yes [provider]  triamcinolone cream (KENALOG) 0.1 % APPLY TO AFFECTED AREA 3 TIMES A DAY 08/31/20  Yes [provider]  XARELTO 20 MG TABS tablet Take 20 mg by mouth daily. 02/20/20  Yes [provider]     Family History  Problem Relation Age of Onset  . Breast cancer Mother        53's  . Breast cancer Maternal Grandmother   . Breast cancer Paternal Grandmother     Social History   Socioeconomic History  . Marital status: Single    Spouse name: Not on file  . Number of children: Not on file  . Years of education: Not on file  . Highest education level: Not on file  Occupational History  . Not on file  Tobacco Use  . Smoking status: Never Smoker  . Smokeless tobacco: Never Used  Vaping Use  . Vaping Use: Never used  Substance and Sexual Activity  . Alcohol use: No  . Drug use: No  . Sexual activity: Not on file  Other Topics Concern  . Not on file  Social History Narrative  .  Not on file   Social Determinants of Health   Financial Resource Strain: Not on file  Food Insecurity: Not on file  Transportation Needs: Not on file  Physical Activity: Not on file  Stress: Not on file  Social Connections: Not on file     Review of Systems: A 12 point ROS discussed and pertinent positives are indicated in the HPI above.  All other systems are negative.  Review of Systems  Constitutional: Positive for fatigue. Negative for fever.  HENT: Negative for congestion.   Respiratory: Positive for shortness of breath ( improved ). Negative for cough.   Gastrointestinal: Negative for abdominal pain, diarrhea, nausea and vomiting.    Vital Signs: BP (!) 131/57 (BP Location: Right Arm)   Pulse 76   Temp 98.6 F (37 C) (Oral)   Resp 20   Ht 5\' 3"  (1.6 m)   SpO2 98%   BMI 34.19 kg/m   Physical Exam Vitals and  nursing note reviewed.  Constitutional:      Appearance: She is well-developed.  HENT:     Head: Normocephalic and atraumatic.  Eyes:     Conjunctiva/sclera: Conjunctivae normal.  Cardiovascular:     Rate and Rhythm: Normal rate and regular rhythm.     Heart sounds: Normal heart sounds.  Pulmonary:     Effort: Pulmonary effort is normal.     Breath sounds: Normal breath sounds.  Musculoskeletal:        General: Normal range of motion.     Cervical back: Normal range of motion.  Skin:    General: Skin is warm.  Neurological:     Mental Status: She is alert and oriented to person, place, and time.     Imaging: CT ABDOMEN PELVIS WO CONTRAST  Result Date: 09/23/2020 CLINICAL DATA:  Right-sided abdominal pain and diarrhea. EXAM: CT ABDOMEN AND PELVIS WITHOUT CONTRAST TECHNIQUE: Multidetector CT imaging of the abdomen and pelvis was performed following the standard protocol without IV contrast. COMPARISON:  None. FINDINGS: Lower chest: Right lower lobe atelectasis or scarring noted. Hepatobiliary: No mass visualized on this unenhanced exam. Several small calcified gallstones are seen. The gallbladder is distended and shows diffuse wall thickening and pericholecystic inflammatory changes, consistent with acute cholecystitis. No evidence of biliary ductal dilatation. Pancreas: No mass or inflammatory process visualized on this unenhanced exam. Spleen:  Within normal limits in size. Adrenals/Urinary tract: No evidence of urolithiasis or hydronephrosis. Unremarkable unopacified urinary bladder. Stomach/Bowel: Surgical staples again seen in the proximal stomach. No evidence of obstruction, inflammatory process, or abnormal fluid collections. Vascular/Lymphatic: No pathologically enlarged lymph nodes identified. No evidence of abdominal aortic aneurysm. Aortic atherosclerotic calcification noted. Reproductive:  No mass or other significant abnormality. Other:  None. Musculoskeletal:  No suspicious bone  lesions identified. IMPRESSION: Findings consistent with acute cholecystitis. No evidence of biliary ductal dilatation. Aortic Atherosclerosis (ICD10-I70.0). Electronically Signed   By: Marlaine Hind M.D.   On: 09/23/2020 18:20   US Abdomen Limited RUQ (LIVER/GB)  Result Date: 09/26/2020 CLINICAL DATA:  Cholecystitis EXAM: ULTRASOUND ABDOMEN LIMITED RIGHT UPPER QUADRANT COMPARISON:  CT abdomen pelvis 09/23/2020 FINDINGS: Gallbladder: Dilated gallbladder filled with layering sludge and multiple gallstones. There is wall thickening, pericholecystic fluid, and a positive sonographic Murphy sign. Largest gallstone measures 9 mm. Common bile duct: Diameter: 3.1 mm Liver: No focal lesion identified. Within normal limits in parenchymal echogenicity. Portal vein is patent on color Doppler imaging with normal direction of blood flow towards the liver. Other: Mild perinephric fluid along the  right kidney as seen on recent CT. IMPRESSION: Findings consistent with acute cholecystitis. Electronically Signed   By: Maurine Simmering   On: 09/26/2020 09:50    Labs:  CBC: Recent Labs    09/23/20 1636 09/24/20 0513 09/25/20 0529  WBC 28.3* 28.0* 34.2*  HGB 9.5* 10.0* 10.5*  HCT 28.1* 29.4* 31.5*  PLT 266 236 279    COAGS: Recent Labs    09/25/20 0529  INR 1.3*    BMP: Recent Labs    09/23/20 1636 09/24/20 0513 09/25/20 0529 09/26/20 0443  NA 125* 128* 129* 128*  K 3.9 3.6 4.2 5.4*  CL 90* 94* 93* 96*  CO2 25 26 25 22   GLUCOSE 152* 136* 187* 150*  BUN 16 17 24* 29*  CALCIUM 8.2* 8.0* 8.6* 8.5*  CREATININE 0.93 1.00 1.07* 1.02*  GFRNONAA >60 58* 53* 56*    LIVER FUNCTION TESTS: Recent Labs    09/23/20 1636 09/24/20 0513 09/25/20 0529  BILITOT 1.3* 1.4* 0.9  AST 72* 61* 63*  ALT 39 35 39  ALKPHOS 106 87 108  PROT 5.8* 5.6* 6.4*  ALBUMIN 2.7* 2.4* 2.7*    Assessment and Plan:  80 y.o. female inpatient. 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 Diagnosed on 5.25.22 with COVID (  on Paxloid). Presented to the ED at [facility] with rising WBC found as outpatient abdominal pain and diarrhea.  CT abdomen pelvis  from 6.3.22 ordered do to concern for draining inguinal abscess a distended gallbladder. CT abd pelvis from 6.3.22 reads Findings consistent with acute cholecystitis. [place] from 6.6.22 reads Dilated gallbladder filled with layering sludge and multiple gallstones. There is wall thickening, pericholecystic fluid, and a positive sonographic Murphy sign. Largest gallstone measures 9 mm. Patient is not a surgical candidate at this time.  Team is requesting a cholecsytomy tube placement.   Sodium 128, BUN 29, Cr 1.02, GFR 56, BNP 629.2 albumin 2.7, AST 63,  WBC is 34.2, INR 1.3. lactic acid 1.0 . All other labs and medications are within acceptable parameters. Afebrile.  Patient has not received Xarelto since admission on 6.3.22 Patient has a numerous allergies. Including Iodine and Sulfa. Please see list for additional allergies.  Risks and benefits discussed with the patient including, but not limited to bleeding, infection, gallbladder perforation, bile leak, sepsis or even death.  All of the patient's questions were answered, patient is agreeable to proceed. Consent signed and in chart.   Thank you for this interesting consult.  I greatly enjoyed meeting Gina Chambers and look forward to participating in their care.  A copy of this report was sent to the requesting provider on this date.  Electronically Signed: Jacqualine Mau, NP 09/26/2020, 10:41 AM   I spent a total of 40 Minutes    in face to face in clinical consultation, greater than 50% of which was counseling/coordinating care for cholecystomy tube placement.

## 2020-09-26 NOTE — Progress Notes (Signed)
PT Cancellation Note  Patient Details Name: Gina Chambers MRN: [id number] DOB: [birth year]   Cancelled Treatment:    Reason Eval/Treat Not Completed: Patient not medically ready.  PT consult received.  Chart reviewed.  Pt's potassium noted to be elevated to 5.4 this morning; per PT guidelines for elevated potassium, exertional activity contra-indicated.  Also, per chart, plan for possible procedure today.  Will hold PT at this time and re-attempt PT evaluation at a later date/time as medically appropriate.  Leitha Bleak, PT 09/26/20, 12:01 PM

## 2020-09-26 NOTE — Progress Notes (Signed)
Phenergan order obtained as patient was profusely vomiting and dry heaving over toilet. Patient then stated she would "be better now that I got it up and probably won't even need the phenergan."

## 2020-09-26 NOTE — Progress Notes (Signed)
   09/24/20 0910  Assess: MEWS Score  Temp 98 F (36.7 C)  BP (!) 91/46  Pulse Rate 73  Resp (!) 26  Level of Consciousness Alert  SpO2 100 %  O2 Device Room Air  Assess: MEWS Score  MEWS Temp 0  MEWS Systolic 1  MEWS Pulse 0  MEWS RR 2  MEWS LOC 0  MEWS Score 3  MEWS Score Color Yellow  Assess: if the MEWS score is Yellow or Red  Were vital signs taken at a resting state? Yes  Focused Assessment  (initial)  Early Detection of Sepsis Score *See Row Information* Medium  MEWS guidelines implemented *See Row Information* Yes  Treat  MEWS Interventions Administered scheduled meds/treatments;Other (Comment) (PRN Combivent for wheezing, MD notified, bolus ordered)  Pain Scale 0-10  Pain Score 0  Complains of Shortness of breath  Take Vital Signs  Increase Vital Sign Frequency  Yellow: Q 2hr X 2 then Q 4hr X 2, if remains yellow, continue Q 4hrs  Escalate  MEWS: Escalate Yellow: discuss with charge nurse/RN and consider discussing with provider and RRT  Notify: Charge Nurse/RN  Name of Charge Nurse/RN Notified Claiborne Billings Isenhour  Date Charge Nurse/RN Notified 09/24/20  Time Charge Nurse/RN Notified [phone]  Notify: Provider  Provider Name/Title Celine Ahr, MD (Attending)  Date Provider Notified 09/24/20  Time Provider Notified [phone]  Notification Type Page  Notification Reason Change in status  Provider response See new orders  Date of Provider Response 09/24/20  Time of Provider Response [phone]  Inserted for Loura Back RN

## 2020-09-26 NOTE — Progress Notes (Signed)
Patient clinically stable post Chole tube placement per DR Mir, tolerated well. Versed 2 mg along Fentanyl 100 mcg IV given for procedure. Report given to Butch Penny RN on [place] post procedure at bedside. Awake/alert and oriented post procedure. Vitals stable pre and post procedure. hospitalist at bedside upon patient's arrival.

## 2020-09-27 DIAGNOSIS — K819 Cholecystitis, unspecified: Secondary | ICD-10-CM

## 2020-09-27 DIAGNOSIS — Z7901 Long term (current) use of anticoagulants: Secondary | ICD-10-CM

## 2020-09-27 DIAGNOSIS — L02214 Cutaneous abscess of groin: Secondary | ICD-10-CM

## 2020-09-27 LAB — BASIC METABOLIC PANEL
Anion gap: 8 (ref 5–15)
BUN: 25 mg/dL — ABNORMAL HIGH (ref 8–23)
CO2: 27 mmol/L (ref 22–32)
Calcium: 8.5 mg/dL — ABNORMAL LOW (ref 8.9–10.3)
Chloride: 96 mmol/L — ABNORMAL LOW (ref 98–111)
Creatinine, Ser: 1.05 mg/dL — ABNORMAL HIGH (ref 0.44–1.00)
GFR, Estimated: 54 mL/min — ABNORMAL LOW (ref >60)
Glucose, Bld: 164 mg/dL — ABNORMAL HIGH (ref 70–99)
Potassium: 3.9 mmol/L (ref 3.5–5.1)
Sodium: 131 mmol/L — ABNORMAL LOW (ref 135–145)

## 2020-09-27 LAB — GLUCOSE, CAPILLARY: Glucose-Capillary: 133 mg/dL — ABNORMAL HIGH (ref 70–99)

## 2020-09-27 MED ORDER — AMOXICILLIN-POT CLAVULANATE 875-125 MG PO TABS: 1.0000 | ORAL_TABLET | Freq: Two times a day (BID) | ORAL | 0 refills | Status: AC

## 2020-09-27 NOTE — TOC Transition Note (Addendum)
Transition of Care [facility]) - CM/SW Discharge Note   Patient Details  Name: Gina Chambers MRN: [id number] Date of Birth: 1940-12-05  [facility] CM/SW Contact:  Kerin Salen, RN Phone Number: 09/27/2020, 9:54 AM   Clinical Narrative:  For discharge home, lives with daughter and son-in law. Patient consents to [facility] RN to assist with care of drain, did not have preference. [facility] called, will call back to confirm.   [facility] called back unable to provide service. Consulted with several other agencies, unable to service due to staffing and or Insurance out of network. Patient given instructions on care of drain by RN and Surgery, patient voices feeling comfortable with caring for drain and advised to report concerns to PCP, patient voices understanding.    Final next level of care: [place] Barriers to Discharge: Barriers Resolved   Patient Goals and CMS Choice Patient states their goals for this hospitalization and ongoing recovery are:: To return home lives with daughter and son-in law.   Choice offered to / list presented to : Patient  Discharge Placement                Patient to be transferred to facility by: Discharge home Name of family member notified: Peggyann Shoals, daughter Patient and family notified of of transfer: 09/27/20  Discharge Plan and Services                DME Arranged: Other see comment (Drain supplies)         HH Arranged: RN HH Agency: [place] ([place]) Date [place]: 09/27/20 Time [place]: [phone] Representative spoke with at [place]: [place] ([place]) Interventions     Readmission Risk Interventions No flowsheet data found.

## 2020-09-27 NOTE — Progress Notes (Signed)
AVS reviewed and pt verbalized understanding of all instructions. Pt was instructed on how to flush drain and empty/record drainage. Follow up appointments and medications were discussed as well. NAD noted prior to discharge and no concerns voiced.

## 2020-09-27 NOTE — Plan of Care (Signed)

## 2020-09-27 NOTE — Progress Notes (Signed)
[facility] SURGICAL PROGRESS NOTE (cpt [phone])  Hospital Day(s): 4.   Interval History: Patient seen and examined, no acute events or new complaints overnight. Patient reports she is feeling much better this morning. No fever, chills, nausea, emesis, or abdominal pain. Cholecystostomy tube with 370 ccs - bilious out, Cx pending. Tolerating a diet. Anxious to go home  Review of Systems:  Constitutional: denies fever, chills  HEENT: denies cough or congestion  Respiratory: denies any shortness of breath  Cardiovascular: denies chest pain or palpitations  Gastrointestinal: denies abdominal pain, N/V, or diarrhea/and bowel function as per interval history Genitourinary: denies burning with urination or urinary frequency  Vital signs in last 24 hours: [min-max] current  Temp:  [97.7 F (36.5 C)-98.6 F (37 C)] 97.8 F (36.6 C) (06/07 0511) Pulse Rate:  [72-84] 73 (06/07 0511) Resp:  [11-22] 18 (06/07 0511) BP: (110-141)/(50-79) 119/53 (06/07 0511) SpO2:  [98 %-100 %] 99 % (06/07 0511)     Height: 5\' 3"  (160 cm)       Intake/Output last 2 shifts:  06/06 0701 - 06/07 0700 In: -  Out: 370 [Drains:370]   Physical Exam:  Constitutional: alert, cooperative and no distress  HENT: normocephalic without obvious abnormality  Eyes: PERRL, EOM's grossly intact and symmetric  Respiratory: breathing non-labored at rest  Cardiovascular: regular rate and sinus rhythm  Gastrointestinal: Soft, non-tender, and non-distended, no rebound/guarding. Cholecystostomy tube in RUQ; output bilious  Musculoskeletal: no edema or wounds, motor and sensation grossly intact, NT    Labs:  CBC Latest Ref Rng & Units 09/26/2020 09/25/2020 09/24/2020  WBC 4.0 - 10.5 K/uL 26.9(H) 34.2(H) 28.0(H)  Hemoglobin 12.0 - 15.0 g/dL 10.4(L) 10.5(L) 10.0(L)  Hematocrit 36.0 - 46.0 % 30.8(L) 31.5(L) 29.4(L)  Platelets 150 - 400 K/uL 376 279 236   CMP Latest Ref Rng & Units 09/27/2020 09/26/2020 09/25/2020  Glucose 70  - 99 mg/dL 164(H) 150(H) 187(H)  BUN 8 - 23 mg/dL 25(H) 29(H) 24(H)  Creatinine 0.44 - 1.00 mg/dL 1.05(H) 1.02(H) 1.07(H)  Sodium 135 - 145 mmol/L 131(L) 128(L) 129(L)  Potassium 3.5 - 5.1 mmol/L 3.9 5.4(H) 4.2  Chloride 98 - 111 mmol/L 96(L) 96(L) 93(L)  CO2 22 - 32 mmol/L 27 22 25   Calcium 8.9 - 10.3 mg/dL 8.5(L) 8.5(L) 8.6(L)  Total Protein 6.5 - 8.1 g/dL - - 6.4(L)  Total Bilirubin 0.3 - 1.2 mg/dL - - 0.9  Alkaline Phos 38 - 126 U/L - - 108  AST 15 - 41 U/L - - 63(H)  ALT 0 - 44 U/L - - 39     Imaging studies: No new pertinent imaging studies   Assessment/Plan: (ICD-10's: K81.0) 80 y.o. female with cholecystitis s/p cholecystostomy, complicated by pertinent comorbidities including COVD+.   - Maintain cholecystostomy tube 6-8 weeks; flush per IR orders. At a minimum, 5 ccs NS daily   - Continue IV Abx (Zosyn); recommend 7 days PO Augmentin at discharge  - Monitor abdominal pain; on-going bowel function             - Pain control prn; antiemetics prn             - Further management per primary service    - Discharge Planning; Okay for discharge from surgical perspective; d/w medicine MD. Abx recommendations as above. She can follow up in ~3 weeks. Drain education provided  All of the above findings and recommendations were discussed with the patient, and the medical team, and all of patient's questions were answered  to her expressed satisfaction.  -- Edison Simon, PA-C [facility] 09/27/2020, 7:29 AM [phone] M-F: 7am - 4pm

## 2020-09-27 NOTE — Care Management Important Message (Signed)
Important Message  Patient Details  Name: GLYN ZENDEJAS MRN: [id number] Date of Birth: 10-03-1940   Medicare Important Message Given:  Yes  I talked with the patient via phone [phone]) as she is in a isolation room. I reviewed the Important Message from Medicare and she is in agreement with her discharge today.  I asked if she would like a copy of the form and she replied yes.  I verified her mailing address and will send via Certified Mail.  I thanked her for her time.  Juliann Pulse A Joury Allcorn 09/27/2020, 10:42 AM

## 2020-09-27 NOTE — Progress Notes (Signed)
PT Cancellation Note  Patient Details Name: Gina Chambers MRN: [id number] DOB: [birth year]   Cancelled Treatment:    Reason Eval/Treat Not Completed: PT screened, no needs identified, will sign off.  Nurse reports pt already discharged home today (nurse reports pt was independent with mobility in room).  OT saw pt this morning and reporting pt independent with ambulation in room and no balance issues noted.  Leitha Bleak, PT 09/27/20, 11:23 AM

## 2020-09-27 NOTE — Evaluation (Signed)
Occupational Therapy Evaluation Patient Details Name: Gina Chambers MRN: [id number] DOB: [birth year] Today's Date: 09/27/2020    History of Present Illness Gina Chambers is a 80 y.o. 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 Diagnosed on 5.25.22 with COVID ( on Paxloid). Presented to the ED at [facility] with rising WBC found as outpatient abdominal pain and diarrhea. S/p cholecystostomy tube placement on 09/26/20.   Clinical Impression   [name] was seen for OT evaluation this date. Prior to hospital admission, pt was Independent for mobility and I/ADLs. Pt lives with son and DIL in two level home c 1 STE, able to live on main. Pt presents to acute OT demonstrating near baseline independence to perform ADL and mobility tasks. Pt completed in room mobility w/o AD and picked small item up off of the floor. MOD I for increased time to perfrom dressing ADLs, pt tolerated 20 min standing w/ varying UE support as pt fatigued. Pt instructed in ECS and falls prevention strategies including having SUPERVISION prior to walking up to 2nd floor for exercise. No skilled acute OT needs identified. Will sign off. Please re-consult if additional OT needs arise.     Follow Up Recommendations  No OT follow up    Equipment Recommendations  None recommended by OT    Recommendations for Other Services       Precautions / Restrictions Precautions Precautions: Other (comment) (abdominal pcns) Restrictions Weight Bearing Restrictions: No      Mobility Bed Mobility               General bed mobility comments: not tested, pt received standing and left standing EOB    Transfers                 General transfer comment: pt completed in room mobility w/o AD and picked small item up off of the floor    Balance Overall balance assessment: No apparent balance deficits (not formally assessed)                                         ADL either performed or assessed with  clinical judgement   ADL Overall ADL's : Modified independent                                       General ADL Comments: MOD I for increased time to perfrom dressing ADLs, pt tolerated 20 min standing w/ varying UE support as pt fatigued.                  Pertinent Vitals/Pain Pain Assessment: No/denies pain     Hand Dominance Right   Extremity/Trunk Assessment Upper Extremity Assessment Upper Extremity Assessment: Overall WFL for tasks assessed (LUE end ROM limitations r/t rotator cuff injury)   Lower Extremity Assessment Lower Extremity Assessment: Overall WFL for tasks assessed       Communication Communication Communication: No difficulties   Cognition Arousal/Alertness: Awake/alert Behavior During Therapy: WFL for tasks assessed/performed Overall Cognitive Status: Within Functional Limits for tasks assessed                                     General Comments  Exercises Exercises: Other exercises Other Exercises Other Exercises: Pt educated re: OT role, DME recs, d/c recs, falls prevention, ECS Other Exercises: UBD, standing blanace/tolerance, functional reach outside BOS, in room ~20 ft mobility   Shoulder Instructions      Home Living Family/patient expects to be discharged to:: Private residence Living Arrangements: Children Available Help at Discharge: Family;Available PRN/intermittently Type of Home: House Home Access: Stairs to enter Entrance Stairs-Number of Steps: 1   Home Layout: Two level;Able to live on main level with bedroom/bathroom               Home Equipment: None          Prior Functioning/Environment Level of Independence: Independent        Comments: Walked "2 laps around the pond" prior to recent covid dx        OT Problem List: Decreased activity tolerance         OT Goals(Current goals can be found in the care plan section) Acute Rehab OT Goals Patient Stated Goal: To  go home today OT Goal Formulation: With patient Time For Goal Achievement: 10/11/20 Potential to Achieve Goals: Good   AM-PAC OT "6 Clicks" Daily Activity     Outcome Measure Help from another person eating meals?: None Help from another person taking care of personal grooming?: None Help from another person toileting, which includes using toliet, bedpan, or urinal?: None Help from another person bathing (including washing, rinsing, drying)?: A Little Help from another person to put on and taking off regular upper body clothing?: None Help from another person to put on and taking off regular lower body clothing?: None 6 Click Score: 23   End of Session    Activity Tolerance: Patient tolerated treatment well Patient left: Other (comment) (standing EOB)  OT Visit Diagnosis: Muscle weakness (generalized) (M62.81)                Time: 6438-3818 OT Time Calculation (min): 22 min Charges:  OT General Charges $OT Visit: 1 Visit OT Evaluation $OT Eval Low Complexity: 1 Low OT Treatments $Self Care/Home Management : 8-22 mins   Dessie Coma, M.S. OTR/L  09/27/20, 10:14 AM  ascom [phone]

## 2020-09-28 NOTE — Discharge Summary (Signed)
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 last one approx 2013   Family history of adverse reaction to anesthesia    Mother - temporary paralysis of 1 side   GERD (gastroesophageal reflux disease)    Hypertension    Neuropathy    feet   PONV (postoperative nausea and vomiting)    Pt reports violent vomiting with ANY pain meds given with anesthesia.   Sleep apnea    CPAP   Thyroid disease    Vertigo    daily   HOSPITAL COURSE:  80 year old female with past medical history of chronic diastolic heart failure, hypertension, diabetes mellitus and COPD plus hypothyroidism and recent COVID infection on 5/25 presented to the emergency room on 6/3 after being sent over by PCP for persistent leukocytosis despite completing antibiotic therapy for left inguinal crease abscess and patient was admitted for acute cholecystitis. Patient felt to be in acute COPD exacerbation.    COPD exacerbation ([place]):  Much improved.  Patient is on room air. Treated with steroids and nebulizers.  Leukocytosis: Resolved   Acquired hypothyroidism: Continue Synthroid.   Acute on chronic diastolic heart failure with preserved left ventricular function (HFpEF) ([place]): Echocardiogram done 9/20.  BNP elevated at 629. Good response to Bumex and euvolemic at D/C   Essential hypertension: controlled on home regimen   Acute Cholecystitis, acute:  s/p cholecystostomy - cholecystostomy tube to remain in place for 6-8 weeks; flush per IR orders. At a minimum, 5 ccs NS daily per surgery - Treated with IV Zosyn while in the hospital and D/Ced on 7 days PO Augmentin  - outpt surgery follow up in 2-3 weeks. Drain education provided by surgery/nursing     COVID-19 virus infection: Patient completed outpatient paxlovid.  On room air. No treatment needed     Type 2 diabetes mellitus without complication ([place]):      Hyponatremia: Likely from both dehydration and dilutional from CHF. Improved and stable.   DISCHARGE CONDITIONS:  stable CONSULTS OBTAINED:  Treatment Team:  Fredirick Maudlin, MD DRUG ALLERGIES:   Allergies  Allergen Reactions   Ropinirole Nausea And Vomiting   Azelastine Other (See Comments)    Closes airways  Closes airways     Bupropion     GI issues    Calcium     Chest pain    Carbidopa-Levodopa Other (See Comments)    Patient reports chest pain, back pain and left side pain   Cefuroxime     Swelling  Cephalexin    Clinoril [Sulindac]     GI   Codeine Hives   Duloxetine     Bleeding    Duloxetine Hcl Other (See Comments)    Bleeding   Ezetimibe     Joint pain    Fenofibrate     Leg cramps   Furosemide     Fluid retention    Glipizide     Bloating    Lorazepam     SI   Lortab [Hydrocodone-Acetaminophen] Hives   Metaproterenol     Palpitations    Morphine And Related    Nalbuphine     Rapid heart rate  flushing    Naproxen     Numbness    Norfloxacin      Urinary retention    Rofecoxib    Sodium    Statins     Muscle pain    Sulfa Antibiotics     Unknown    Sulfasalazine Other (See Comments)    Unknown   Tramadol    Trazodone And Nefazodone    Doxycycline Rash   Iodine Rash   Ketoprofen Rash   Piroxicam Rash   Tolmetin Rash   DISCHARGE MEDICATIONS:   Allergies as of 09/27/2020       Reactions   Ropinirole Nausea And Vomiting   Azelastine Other (See Comments)   Closes airways  Closes airways    Bupropion    GI issues   Calcium    Chest pain   Carbidopa-levodopa Other (See Comments)   Patient reports chest pain, back pain and left side pain   Cefuroxime    Swelling   Cephalexin    Clinoril [sulindac]    GI   Codeine Hives   Duloxetine    Bleeding   Duloxetine Hcl Other (See Comments)   Bleeding   Ezetimibe    Joint pain   Fenofibrate    Leg cramps   Furosemide    Fluid retention    Glipizide    Bloating   Lorazepam    SI   Lortab [hydrocodone-acetaminophen] Hives   Metaproterenol    Palpitations    Morphine And Related    Nalbuphine    Rapid heart rate  flushing    Naproxen    Numbness   Norfloxacin    Urinary retention    Rofecoxib    Sodium    Statins    Muscle pain   Sulfa Antibiotics    Unknown   Sulfasalazine Other (See Comments)   Unknown   Tramadol    Trazodone And Nefazodone    Doxycycline Rash   Iodine Rash   Ketoprofen Rash   Piroxicam Rash   Tolmetin Rash        Medication List     TAKE these medications    amoxicillin-clavulanate 875-125 MG tablet Commonly known as: Augmentin Take 1 tablet by mouth every 12 (twelve) hours for 7 days.   aspirin EC 81 MG tablet Take 81 mg by mouth at bedtime.   bumetanide 0.5 MG tablet Commonly known as: BUMEX Take 0.5-1 mg by mouth See admin instructions. Take 2 tablets (1mg ) in the morning and 1 tablet (0.5 mg) in the evening   Chromium Picolinate 200 MCG Caps Take 200 mcg by mouth daily.   diphenhydrAMINE 25 mg  capsule Commonly known as: BENADRYL Take 25 mg by mouth every 6 (six) hours as needed.   fluticasone 110 MCG/ACT inhaler Commonly known as: FLOVENT HFA Inhale 2 puffs into the lungs  2 (two) times daily.   gabapentin 100 MG capsule Commonly known as: NEURONTIN Take 200 mg by mouth at bedtime.   insulin glargine 100 UNIT/ML injection Commonly known as: LANTUS Inject 20 Units into the skin See admin instructions. Takes 20 units in the morning,Takes lantus at night based on sliding scale   insulin regular 100 units/mL injection Commonly known as: NOVOLIN R Inject 10 Units into the skin 4 (four) times daily. Only uses Sliding scale at night   Ipratropium-Albuterol 20-100 MCG/ACT Aers respimat Commonly known as: COMBIVENT Inhale 1 puff into the lungs 4 (four) times daily as needed for wheezing or shortness of breath.   lansoprazole 30 MG capsule Commonly known as: PREVACID Take 30 mg by mouth 2 (two) times daily.   levalbuterol 0.63 MG/3ML nebulizer solution Commonly known as: XOPENEX Inhale 0.63 mg into the lungs every 6 (six) hours as needed.   levothyroxine 125 MCG tablet Commonly known as: SYNTHROID Take 125 mcg by mouth daily.   Magnesium 500 MG Tabs Take 500 mg by mouth 4 (four) times daily.   metoprolol succinate 50 MG 24 hr tablet Commonly known as: TOPROL-XL Take 50 mg by mouth daily. Take with or immediately following a meal.   montelukast 10 MG tablet Commonly known as: SINGULAIR Take 10 mg by mouth at bedtime.   Myrbetriq 25 MG Tb24 tablet Generic drug: mirabegron ER Take 25 mg by mouth daily.   PRESERVISION AREDS 2 PO Take 1 tablet by mouth 2 (two) times daily.   triamcinolone cream 0.1 % Commonly known as: KENALOG APPLY TO AFFECTED AREA 3 TIMES A DAY   Xarelto 20 MG Tabs tablet Generic drug: rivaroxaban Take 20 mg by mouth daily.       DISCHARGE INSTRUCTIONS:   DIET:  Cardiac diet DISCHARGE CONDITION:  Stable ACTIVITY:  Activity as  tolerated OXYGEN:  Home Oxygen: No.  Oxygen Delivery: room air DISCHARGE LOCATION:  home   If you experience worsening of your admission symptoms, develop shortness of breath, life threatening emergency, suicidal or homicidal thoughts you must seek medical attention immediately by calling 911 or calling your MD immediately  if symptoms less severe.  You Must read complete instructions/literature along with all the possible adverse reactions/side effects for all the Medicines you take and that have been prescribed to you. Take any new Medicines after you have completely understood and accpet all the possible adverse reactions/side effects.   Please note  You were cared for by a hospitalist during your hospital stay. If you have any questions about your discharge medications or the care you received while you were in the hospital after you are discharged, you can call the unit and asked to speak with the hospitalist on call if the hospitalist that took care of you is not available. Once you are discharged, your primary care physician will handle any further medical issues. Please note that NO REFILLS for any discharge medications will be authorized once you are discharged, as it is imperative that you return to your primary care physician (or establish a relationship with a primary care physician if you do not have one) for your aftercare needs so that they can reassess your need for medications and monitor your lab values.    On the day of Discharge:  VITAL SIGNS:  Blood pressure (!) 141/63, pulse 80, temperature 98.2 F (36.8 C), temperature source Oral, resp. rate 18, height 5\' 3"  (1.6 m), SpO2 99 %. PHYSICAL EXAMINATION:  GENERAL:  80 y.o.-year-old patient  lying in the bed with no acute distress.  EYES: Pupils equal, round, reactive to light and accommodation. No scleral icterus. Extraocular muscles intact.  HEENT: Head atraumatic, normocephalic. Oropharynx and nasopharynx clear.  NECK:   Supple, no jugular venous distention. No thyroid enlargement, no tenderness.  LUNGS: Normal breath sounds bilaterally, no wheezing, rales,rhonchi or crepitation. No use of accessory muscles of respiration.  CARDIOVASCULAR: S1, S2 normal. No murmurs, rubs, or gallops.  ABDOMEN: Soft, non-tender, non-distended. Bowel sounds present. No organomegaly or mass.  EXTREMITIES: No pedal edema, cyanosis, or clubbing.  NEUROLOGIC: Cranial nerves II through XII are intact. Muscle strength 5/5 in all extremities. Sensation intact. Gait not checked.  PSYCHIATRIC: The patient is alert and oriented x 3.  SKIN: No obvious rash, lesion, or ulcer.  DATA REVIEW:   CBC Recent Labs  Lab 09/26/20 1039  WBC 26.9*  HGB 10.4*  HCT 30.8*  PLT 376    Chemistries  Recent Labs  Lab 09/25/20 0529 09/26/20 0443 09/27/20 0536  NA 129*   < > 131*  K 4.2   < > 3.9  CL 93*   < > 96*  CO2 25   < > 27  GLUCOSE 187*   < > 164*  BUN 24*   < > 25*  CREATININE 1.07*   < > 1.05*  CALCIUM 8.6*   < > 8.5*  MG 2.2  --   --   AST 63*  --   --   ALT 39  --   --   ALKPHOS 108  --   --   BILITOT 0.9  --   --    < > = values in this interval not displayed.     Outpatient follow-up  Follow-up Information     Fredirick Maudlin, MD. Go on 10/18/2020.   Specialty: General Surgery Why:  9:15am appointment Contact information: [address] [place] [address] [phone]         Kirk Ruths, MD. Go on 09/30/2020.   Specialty: Internal Medicine Why: 2:30pm appointment Contact information: [address] [place] [address] [phone]                 61 Day Unplanned Readmission Risk Score    Flowsheet Row ED to Hosp-Admission (Discharged) from 09/23/2020 in [place]  30 Day Unplanned Readmission Risk Score (%) 19.76 Filed at 09/27/2020 0801       This score is the patient's risk of an unplanned  readmission within 30 days of being discharged (0 -100%). The score is based on dignosis, age, lab data, medications, orders, and past utilization.   Low:  0-14.9   Medium: 15-21.9   High: 22-29.9   Extreme: 30 and above          Management plans discussed with the patient, family and they are in agreement.  CODE STATUS: Prior   TOTAL TIME TAKING CARE OF THIS PATIENT: 45 minutes.    Max Sane M.D on 09/28/2020 at 4:19 PM  [facility]   CC: Primary care physician; Kirk Ruths, MD   Note: This dictation was prepared with Dragon dictation along with smaller phrase technology. Any transcriptional errors that result from this process are unintentional.

## 2020-10-02 LAB — AEROBIC/ANAEROBIC CULTURE W GRAM STAIN (SURGICAL/DEEP WOUND)

## 2020-10-18 ENCOUNTER — Telehealth: Payer: Self-pay

## 2020-10-18 ENCOUNTER — Ambulatory Visit (INDEPENDENT_AMBULATORY_CARE_PROVIDER_SITE_OTHER): Payer: Medicare Other | Admitting: General Surgery

## 2020-10-18 ENCOUNTER — Encounter: Payer: Self-pay | Admitting: General Surgery

## 2020-10-18 ENCOUNTER — Other Ambulatory Visit: Payer: Self-pay

## 2020-10-18 VITALS — BP 136/59 | HR 87 | Temp 98.4°F | Ht 63.0 in | Wt 179.8 lb

## 2020-10-18 DIAGNOSIS — K819 Cholecystitis, unspecified: Secondary | ICD-10-CM

## 2020-10-18 NOTE — Telephone Encounter (Signed)
Spoke with the patient. She is scheduled for a drain study with possible capping trial at [facility] on 11/03/20 at 9:30 am. She will arrive at the [place] entrance by 9:15 am. There is no preparation for this study. She is also aware that I have called in her flush syringes to [facility] in [place]. She will call them tomorrow to see if they are ready. She is aware to call with any questions.

## 2020-10-18 NOTE — Patient Instructions (Addendum)
We will schedule you for a drain study in 2-3 weeks at [facility]. We will call you about this.   You do not need to keep a drain sheet record.   You may shower. You may change your dressing when needed. Do not soak in the tub or submerge.   We will send in a prescription for saline flushes to [facility] in [place].  [facility] is located at [address] [place], [place] [address] [facility]: [phone]

## 2020-10-18 NOTE — Progress Notes (Signed)
Patient ID: Gina Chambers, female   DOB: [birth year], 80 y.o.   MRN: [id number]  No chief complaint on file.   HPI Gina Chambers is a 80 y.o. female.   She is here today for follow-up from a recent hospitalization.  She had a COPD exacerbation and had COVID-pneumonia.  She also had cholecystitis.  Due to her pulmonary issues, she was felt to be a very poor surgical candidate and underwent percutaneous cholecystostomy tube placement.  She was discharged on a course of antibiotics and is here today for follow-up of her tube.  She denies any fevers or chills.  No nausea or vomiting.  No abdominal pain.  She says that she thinks that the drain is putting out a lot of fluid.  She is complaining of sores in her nose, as well as nosebleeds and a sore mouth.  She also endorses a lot of acid reflux and heartburn.  She says that she finds it difficult to flush her cholecystostomy tube secondary to the neuropathy in her hands as well as poor lighting/vision.  She is accompanied by her daughter.    Past Medical History:  Diagnosis Date   Asthma    Asthmatic bronchitis , chronic (HCC)    CHF (congestive heart failure) ([place])    Diabetes mellitus without complication ([place])    DVT (deep venous thrombosis) (HCC)    x3 - both legs. last one approx 2013   Family history of adverse reaction to anesthesia    Mother - temporary paralysis of 1 side   GERD (gastroesophageal reflux disease)    Hypertension    Neuropathy    feet   PONV (postoperative nausea and vomiting)    Pt reports violent vomiting with ANY pain meds given with anesthesia.   Sleep apnea    CPAP   Thyroid disease    Vertigo    daily    Past Surgical History:  Procedure Laterality Date   CARDIAC SURGERY     CATARACT EXTRACTION W/PHACO Right 03/11/2017   Procedure: CATARACT EXTRACTION PHACO AND INTRAOCULAR LENS PLACEMENT ([place])  RIGHT DIABETIC;  Surgeon: Eulogio Bear, MD;  Location: [facility];  Service: Ophthalmology;   Laterality: Right;  Diabetic - insulin sleep apnea   CATARACT EXTRACTION W/PHACO Left 04/02/2017   Procedure: CATARACT EXTRACTION PHACO AND INTRAOCULAR LENS PLACEMENT ([place]) LEFT DIABETES;  Surgeon: Eulogio Bear, MD;  Location: [facility];  Service: Ophthalmology;  Laterality: Left;  Diabetic - insulin   COLONOSCOPY WITH PROPOFOL N/A 11/09/2016   Procedure: COLONOSCOPY WITH PROPOFOL;  Surgeon: Jonathon Bellows, MD;  Location: [facility] ENDOSCOPY;  Service: Gastroenterology;  Laterality: N/A;   HERNIA REPAIR     VERTICAL BANDED GASTROPLASTY      Family History  Problem Relation Age of Onset   Breast cancer Mother        28's   Breast cancer Maternal Grandmother    Breast cancer Paternal Grandmother     Social History Social History   Tobacco Use   Smoking status: Never   Smokeless tobacco: Never  Vaping Use   Vaping Use: Never used  Substance Use Topics   Alcohol use: No   Drug use: No    Allergies  Allergen Reactions   Ropinirole Nausea And Vomiting   Azelastine Other (See Comments)    Closes airways  Closes airways     Bupropion     GI issues    Calcium     Chest pain  Carbidopa-Levodopa Other (See Comments)    Patient reports chest pain, back pain and left side pain   Cefuroxime     Swelling    Cephalexin    Clinoril [Sulindac]     GI   Codeine Hives   Duloxetine     Bleeding    Duloxetine Hcl Other (See Comments)    Bleeding   Ezetimibe     Joint pain    Fenofibrate     Leg cramps   Furosemide     Fluid retention    Glipizide     Bloating    Lorazepam     SI   Lortab [Hydrocodone-Acetaminophen] Hives   Metaproterenol     Palpitations    Morphine And Related    Nalbuphine     Rapid heart rate  flushing    Naproxen     Numbness    Norfloxacin     Urinary retention    Rofecoxib    Sodium    Statins     Muscle pain    Sulfa Antibiotics     Unknown    Sulfasalazine Other (See Comments)    Unknown   Tramadol    Trazodone  And Nefazodone    Doxycycline Rash   Iodine Rash   Ketoprofen Rash   Piroxicam Rash   Tolmetin Rash    Current Outpatient Medications  Medication Sig Dispense Refill   aspirin EC 81 MG tablet Take 81 mg by mouth at bedtime.     bumetanide (BUMEX) 0.5 MG tablet Take 0.5-1 mg by mouth See admin instructions. Take 2 tablets (1mg ) in the morning and 1 tablet (0.5 mg) in the evening     Chromium Picolinate 200 MCG CAPS Take 200 mcg by mouth daily.     diphenhydrAMINE (BENADRYL) 25 mg capsule Take 25 mg by mouth every 6 (six) hours as needed.     fluticasone (FLOVENT HFA) 110 MCG/ACT inhaler Inhale 2 puffs into the lungs 2 (two) times daily.      gabapentin (NEURONTIN) 100 MG capsule Take 200 mg by mouth at bedtime.     insulin glargine (LANTUS) 100 UNIT/ML injection Inject 20 Units into the skin See admin instructions. Takes 20 units in the morning,Takes lantus at night based on sliding scale     insulin regular (NOVOLIN R,HUMULIN R) 250 units/2.61mL (100 units/mL) injection Inject 10 Units into the skin 4 (four) times daily. Only uses Sliding scale at night     Ipratropium-Albuterol (COMBIVENT) 20-100 MCG/ACT AERS respimat Inhale 1 puff into the lungs 4 (four) times daily as needed for wheezing or shortness of breath.     lansoprazole (PREVACID) 30 MG capsule Take 30 mg by mouth 2 (two) times daily.     levalbuterol (XOPENEX) 0.63 MG/3ML nebulizer solution Inhale 0.63 mg into the lungs every 6 (six) hours as needed.     levothyroxine (SYNTHROID, LEVOTHROID) 125 MCG tablet Take 125 mcg by mouth daily.     Magnesium 500 MG TABS Take 500 mg by mouth 4 (four) times daily.      metoprolol succinate (TOPROL-XL) 50 MG 24 hr tablet Take 50 mg by mouth daily. Take with or immediately following a meal.     montelukast (SINGULAIR) 10 MG tablet Take 10 mg by mouth at bedtime.     Multiple Vitamins-Minerals (PRESERVISION AREDS 2 PO) Take 1 tablet by mouth 2 (two) times daily.     MYRBETRIQ 25 MG TB24 tablet  Take 25 mg by mouth daily.  XARELTO 20 MG TABS tablet Take 20 mg by mouth daily.     Multiple Vitamins-Minerals (CENTRUM SILVER PO) Take by mouth.     UBIQUINOL PO Use 300 mg     VITAMIN E PO Take by mouth.     zinc gluconate 1.5 mg/mL SOLN Take by mouth.     No current facility-administered medications for this visit.    Review of Systems Review of Systems  HENT:  Positive for mouth sores and nosebleeds.   Gastrointestinal:  Positive for diarrhea.       Heartburn  All other systems reviewed and are negative. Or as discussed in the history of present illness.  Blood pressure (!) 136/59, pulse 87, temperature 98.4 F (36.9 C), height 5\' 3"  (1.6 m), weight 179 lb 12.8 oz (81.6 kg), SpO2 97 %. Body mass index is 31.85 kg/m.  Physical Exam Physical Exam Constitutional:      General: She is not in acute distress.    Appearance: She is obese.  HENT:     Head: Normocephalic and atraumatic.     Nose:     Comments: Covered with a mask    Mouth/Throat:     Comments: Covered with a mask Eyes:     General: No scleral icterus.       Right eye: No discharge.        Left eye: No discharge.  Cardiovascular:     Rate and Rhythm: Normal rate and regular rhythm.  Pulmonary:     Effort: Pulmonary effort is normal.     Breath sounds: Normal breath sounds.  Abdominal:     General: Bowel sounds are normal. There is no distension.     Palpations: Abdomen is soft.     Tenderness: There is no abdominal tenderness.     Comments: Cholecystostomy tube in the right upper quadrant draining clear amber bile.  Genitourinary:    Comments: Deferred Musculoskeletal:     Right lower leg: No edema.     Left lower leg: No edema.  Skin:    General: Skin is warm and dry.     Coloration: Skin is not jaundiced.  Neurological:     General: No focal deficit present.     Mental Status: She is alert.  Psychiatric:        Mood and Affect: Mood normal.        Behavior: Behavior normal.    Data  Reviewed I reviewed the electronic medical record detailing her hospital course from June 3 through September 27, 2020.  The primary concern during that admission was actually her COVID related pneumonia and COPD exacerbation.  Assessment This is a 80 year old woman with multiple medical comorbidities.  She was in the hospital at the beginning of June with COPD exacerbation and COVID-pneumonia.  She was incidentally found to have imaging consistent with cholecystitis.  Due to the risks of surgery, she was treated with percutaneous drainage and antibiotics.  Plan I had a long discussion with the patient and her daughter today.  It seems there was some confusion over what was to be expected coming from the drain.  I reassured her that what I saw was normal bile and did not appear to be infected at all.  We attempted to find some prefilled saline syringes for her, but there is a Producer, television/film/video of this item.  We were able to provide her with sterile syringes for her flushes.  I still think that surgery is a less ideal option for  management and I am hopeful that she will be able to tolerate a capping trial and subsequent removal of her biliary drain.  We will order an injection study.  If her cystic duct is patent, interventional radiology may proceed with a capping trial and plan to remove the drain in few weeks.  If she fails the capping trial, then we will need to reevaluate the possible need for surgery.  Altogether, I spent greater than 50% of this 45-minute visit in counseling and coordination of patient care.    Fredirick Maudlin 10/18/2020, 9:42 AM

## 2020-11-03 ENCOUNTER — Other Ambulatory Visit: Payer: Self-pay

## 2020-11-03 ENCOUNTER — Ambulatory Visit
Admission: RE | Admit: 2020-11-03 | Discharge: 2020-11-03 | Disposition: A | Discharge status: Home / Self Care | Payer: Medicare Other | Source: Ambulatory Visit | Attending: General Surgery | Admitting: General Surgery

## 2020-11-03 DIAGNOSIS — K819 Cholecystitis, unspecified: Secondary | ICD-10-CM | POA: Diagnosis not present

## 2020-11-03 MED ORDER — IOHEXOL 300 MG/ML  SOLN
25.0000 mL | Freq: Once | INTRAMUSCULAR | Status: AC | PRN
  Administered 2020-11-03: 25 mL

## 2020-11-07 ENCOUNTER — Other Ambulatory Visit: Payer: Self-pay | Admitting: Interventional Radiology

## 2020-11-07 DIAGNOSIS — K810 Acute cholecystitis: Secondary | ICD-10-CM

## 2020-11-09 ENCOUNTER — Telehealth: Payer: Self-pay | Admitting: *Deleted

## 2020-11-09 NOTE — Telephone Encounter (Signed)
Patient called and wanted to know if she needs to come back to the office from a follow up from her Cholangiogram. Please call and advise

## 2020-11-09 NOTE — Telephone Encounter (Signed)
Spoke with the patient. Per Dr Celine Ahr there is no need for her to follow up here unless she fails the capping trial. She is currently 6 days out from capping of her tube and she reports that she is doing well. She has a follow up with them on August 3rd and will call us if she needs to follow up here.

## 2020-11-23 ENCOUNTER — Ambulatory Visit
Admission: RE | Admit: 2020-11-23 | Discharge: 2020-11-23 | Disposition: A | Discharge status: Home / Self Care | Payer: Medicare Other | Source: Ambulatory Visit | Attending: Interventional Radiology | Admitting: Interventional Radiology

## 2020-11-23 ENCOUNTER — Encounter: Payer: Self-pay | Admitting: *Deleted

## 2020-11-23 ENCOUNTER — Other Ambulatory Visit: Payer: Self-pay

## 2020-11-23 DIAGNOSIS — K810 Acute cholecystitis: Secondary | ICD-10-CM

## 2020-11-23 HISTORY — PX: IR RADIOLOGIST EVAL & MGMT: IMG5224

## 2020-11-23 NOTE — Consult Note (Signed)
Chief Complaint: Cholelithiasis  History of Present Illness: Gina Chambers is a 80 y.o. female With multiple medical comorbidities who was hospitalized in the beginning of June of this year with COPD exacerbation and COVID.  She was incidentally found to have acute cholecystitis and subsequently underwent percutaneous cholecystostomy drain placement.  Most recent cholangiogram performed on 11/03/2020 demonstrates patent cystic duct with multiple filling defects in the gallbladder consistent with cholelithiasis.   She feels well today.  She denies significant abdominal pain, fever, or chills.  Past Medical History:  Diagnosis Date   Asthma    Asthmatic bronchitis , chronic (HCC)    CHF (congestive heart failure) ([place])    Diabetes mellitus without complication ([place])    DVT (deep venous thrombosis) (HCC)    x3 - both legs. last one approx 2013   Family history of adverse reaction to anesthesia    Mother - temporary paralysis of 1 side   GERD (gastroesophageal reflux disease)    Hypertension    Neuropathy    feet   PONV (postoperative nausea and vomiting)    Pt reports violent vomiting with ANY pain meds given with anesthesia.   Sleep apnea    CPAP   Thyroid disease    Vertigo    daily    Past Surgical History:  Procedure Laterality Date   CARDIAC SURGERY     CATARACT EXTRACTION W/PHACO Right 03/11/2017   Procedure: CATARACT EXTRACTION PHACO AND INTRAOCULAR LENS PLACEMENT ([facility])  RIGHT DIABETIC;  Surgeon: Eulogio Bear, MD;  Location: [facility];  Service: Ophthalmology;  Laterality: Right;  Diabetic - insulin sleep apnea   CATARACT EXTRACTION W/PHACO Left 04/02/2017   Procedure: CATARACT EXTRACTION PHACO AND INTRAOCULAR LENS PLACEMENT ([place]) LEFT DIABETES;  Surgeon: Eulogio Bear, MD;  Location: [facility];  Service: Ophthalmology;  Laterality: Left;  Diabetic - insulin   COLONOSCOPY WITH PROPOFOL N/A 11/09/2016   Procedure: COLONOSCOPY WITH  PROPOFOL;  Surgeon: Jonathon Bellows, MD;  Location: [facility] ENDOSCOPY;  Service: Gastroenterology;  Laterality: N/A;   HERNIA REPAIR     VERTICAL BANDED GASTROPLASTY      Allergies: Ropinirole, Azelastine, Bupropion, Calcium, Carbidopa-levodopa, Cefuroxime, Cephalexin, Clinoril [sulindac], Codeine, Duloxetine, Duloxetine hcl, Ezetimibe, Fenofibrate, Furosemide, Glipizide, Lorazepam, Lortab [hydrocodone-acetaminophen], Metaproterenol, Morphine and related, Nalbuphine, Naproxen, Norfloxacin, Rofecoxib, Sodium, Statins, Sulfa antibiotics, Sulfasalazine, Tramadol, Trazodone and nefazodone, Doxycycline, Iodine, Ketoprofen, Piroxicam, and Tolmetin  Medications: Prior to Admission medications   Medication Sig Start Date End Date Taking? Authorizing Provider  aspirin EC 81 MG tablet Take 81 mg by mouth at bedtime.    [provider]  bumetanide (BUMEX) 0.5 MG tablet Take 0.5-1 mg by mouth See admin instructions. Take 2 tablets ('1mg'$ ) in the morning and 1 tablet (0.5 mg) in the evening    [provider]  Chromium Picolinate 200 MCG CAPS Take 200 mcg by mouth daily.    [provider]  diphenhydrAMINE (BENADRYL) 25 mg capsule Take 25 mg by mouth every 6 (six) hours as needed. 04/27/19   [provider]  fluticasone (FLOVENT HFA) 110 MCG/ACT inhaler Inhale 2 puffs into the lungs 2 (two) times daily.     [provider]  gabapentin (NEURONTIN) 100 MG capsule Take 200 mg by mouth at bedtime. 02/18/20 02/17/21  [provider]  insulin glargine (LANTUS) 100 UNIT/ML injection Inject 20 Units into the skin See admin instructions. Takes 20 units in the morning,Takes lantus at night based on sliding scale    [provider]  insulin regular (NOVOLIN R,HUMULIN R) 250 units/2.3m (100 units/mL) injection Inject 10 Units into the skin 4 (four) times daily. Only uses Sliding scale at night    [provider]  Ipratropium-Albuterol (COMBIVENT) 20-100 MCG/ACT  AERS respimat Inhale 1 puff into the lungs 4 (four) times daily as needed for wheezing or shortness of breath.    [provider]  lansoprazole (PREVACID) 30 MG capsule Take 30 mg by mouth 2 (two) times daily.    [provider]  levalbuterol ([place] 0.63 MG/3ML nebulizer solution Inhale 0.63 mg into the lungs every 6 (six) hours as needed. 04/27/19   [provider]  levothyroxine (SYNTHROID, LEVOTHROID) 125 MCG tablet Take 125 mcg by mouth daily.    [provider]  Magnesium 500 MG TABS Take 500 mg by mouth 4 (four) times daily.     [provider]  metoprolol succinate (TOPROL-XL) 50 MG 24 hr tablet Take 50 mg by mouth daily. Take with or immediately following a meal.    [provider]  montelukast (SINGULAIR) 10 MG tablet Take 10 mg by mouth at bedtime.    [provider]  Multiple Vitamins-Minerals (CENTRUM SILVER PO) Take by mouth.    [provider]  Multiple Vitamins-Minerals (PRESERVISION AREDS 2 PO) Take 1 tablet by mouth 2 (two) times daily.    [provider]  MYRBETRIQ 25 MG TB24 tablet Take 25 mg by mouth daily. 12/21/19   [provider]  UBIQUINOL PO Use 300 mg    [provider]  VITAMIN E PO Take by mouth.    [provider]  XARELTO 20 MG TABS tablet Take 20 mg by mouth daily. 02/20/20   [provider]  zinc gluconate 1.5 mg/mL SOLN Take by mouth.    [provider]     Family History  Problem Relation Age of Onset   Breast cancer Mother        727's  Breast cancer Maternal Grandmother    Breast cancer Paternal Grandmother     Social History   Socioeconomic History   Marital status: Single    Spouse name: Not on file   Number of children: Not on file   Years of education: Not on file   Highest education level: Not on file  Occupational History   Not on file  Tobacco Use   Smoking status: Never   Smokeless tobacco: Never  Vaping Use    Vaping Use: Never used  Substance and Sexual Activity   Alcohol use: No   Drug use: No   Sexual activity: Not on file  Other Topics Concern   Not on file  Social History Narrative   Not on file   Social Determinants of Health   Financial Resource Strain: Not on file  Food Insecurity: Not on file  Transportation Needs: Not on file  Physical Activity: Not on file  Stress: Not on file  Social Connections: Not on file   Review of Systems  Review of Systems: A 12 point ROS discussed and pertinent positives are indicated in the HPI above.  All other systems are negative.  Physical Exam No direct physical exam was performed   Vital Signs: There were no vitals taken for this visit.  Imaging: DG CHOLANGIOGRAM  EXISTING TUBE  Result Date: 11/03/2020 CLINICAL DATA:  gallbladder dysfunction EXAM: CHOLECYSTOSTOMY CATHETER INJECTION UNDER FLUOROSCOPY TECHNIQUE: The procedure, risks (including but not limited to bleeding, infection, organ damage), benefits, and alternatives were  explained to the patient. Questions regarding the procedure were encouraged and answered. The patient understands and consents to the procedure. Survey fluoroscopic inspection reveals stable position of the pigtail cholecystostomy catheter. Injection demonstrates multiple small filling defects in the lumen of the gallbladder. Cystic duct is patent. Contrast flows into the decompressed duodenum. There is limited opacification of the CBD. IMPRESSION: 1. Patency of cystic duct with no evidence of obstructing choledocholithiasis. As per the original plan, the cholecystostomy tube was capped and the patient was given a new bag with instructions to return to external drainage in the setting of recurrent gallbladder symptoms, right upper quadrant pain, fever, or drainage around the cholecystostomy catheter. We can see her back in 2-3 weeks. Electronically Signed   By: Lucrezia Europe M.D.   On: 11/03/2020 10:45     Labs:  CBC: Recent Labs    09/23/20 1636 09/24/20 0513 09/25/20 0529 09/26/20 1039  WBC 28.3* 28.0* 34.2* 26.9*  HGB 9.5* 10.0* 10.5* 10.4*  HCT 28.1* 29.4* 31.5* 30.8*  PLT 266 236 279 376    COAGS: Recent Labs    09/25/20 0529  INR 1.3*    BMP: Recent Labs    09/24/20 0513 09/25/20 0529 09/26/20 0443 09/27/20 0536  NA 128* 129* 128* 131*  K 3.6 4.2 5.4* 3.9  CL 94* 93* 96* 96*  CO2 '26 25 22 27  '$ GLUCOSE 136* 187* 150* 164*  BUN 17 24* 29* 25*  CALCIUM 8.0* 8.6* 8.5* 8.5*  CREATININE 1.00 1.07* 1.02* 1.05*  GFRNONAA 58* 53* 56* 54*    LIVER FUNCTION TESTS: Recent Labs    09/23/20 1636 09/24/20 0513 09/25/20 0529  BILITOT 1.3* 1.4* 0.9  AST 72* 61* 63*  ALT 39 35 39  ALKPHOS 106 87 108  PROT 5.8* 5.6* 6.4*  ALBUMIN 2.7* 2.4* 2.7*    TUMOR MARKERS: No results for input(s): AFPTM, CEA, CA199, CHROMGRNA in the last 8760 hours.  Assessment and Plan:  80 year old woman with multiple comorbidities, recently hospitalized with COPD exacerbation and COVID, and found to have acute cholecystitis requiring percutaneous cholecystostomy drain placement.  She returns to IR clinic today for follow-up.  Cholangiogram performed on 11/03/2020 showed patent cystic duct with multiple intraluminal filling defects consistent with gallstones.   I explained to her and her daughter Wells Guiles, that if the patient is a candidate for cholecystectomy, this is the best option.  If the patient is not a candidate for cholecystectomy, we can consider either drain removal or Spyglass gallstone extraction followed by drain removal. I will discuss the case with Dr. Celine Ahr determine if the patient is a candidate for cholecystectomy.   If the patient is not a candidate for cholecystectomy, we will contact the patient for possible drain removal or Spyglass procedure with stone extraction followed by drain removal.  Gallstone extraction would decrease her chances of recurrent acute  cholecystitis compared to simply removing the drain.  I discussed with her the potential risks of the procedure, including infection, bleeding, possible damage to the gallbladder and bile ducts.   She and her daughter would like to confirm that she is not a candidate for cholecystectomy prior to making the decision between simply removing the drain or performing Spyglass procedure with stone extraction prior to drain removal.  Thank you for this interesting consult.  I greatly enjoyed meeting ZALIE JERSEY and look forward to participating in their care.  A copy of this report was sent to the requesting provider on this date.  Electronically Signed:  Paula Libra Natahlia Hoggard 11/23/2020, 3:05 PM   I spent a total of   25 Minutes in remote  clinical consultation, greater than 50% of which was counseling/coordinating care for cholelithiasis.    Visit type: Audio only (telephone). Audio (no video) only due to patient's lack of internet/smartphone capability.  Alternative for in-person consultation at [facility], [address], [place], [place].  This visit type was conducted due to national recommendations for restrictions regarding the COVID-19 Pandemic (e.g. social distancing).  This format is felt to be most appropriate for this patient at this time.  All issues noted in this document were discussed and addressed.

## 2020-11-30 ENCOUNTER — Telehealth: Payer: Self-pay | Admitting: General Surgery

## 2020-11-30 NOTE — Telephone Encounter (Signed)
Discussed recent drain study results w/patient. She remains a poor surgical candidate.  IR is now offering a stone removal procedure and offered this as a potential option.  Patient reported that she did not want to have the procedure and that she would "do a cleanse" that will take care of her stones. She wants the current tube removed.  I communicated this to radiology and let the patient know that IR would contact her for scheduling.

## 2020-12-02 ENCOUNTER — Other Ambulatory Visit: Payer: Self-pay | Admitting: Interventional Radiology

## 2020-12-02 DIAGNOSIS — K810 Acute cholecystitis: Secondary | ICD-10-CM

## 2020-12-05 ENCOUNTER — Other Ambulatory Visit: Payer: Self-pay | Admitting: Radiology

## 2020-12-06 ENCOUNTER — Other Ambulatory Visit: Payer: Self-pay | Admitting: Interventional Radiology

## 2020-12-06 ENCOUNTER — Other Ambulatory Visit: Payer: Self-pay

## 2020-12-06 ENCOUNTER — Ambulatory Visit
Admission: RE | Admit: 2020-12-06 | Discharge: 2020-12-06 | Disposition: A | Discharge status: Home / Self Care | Payer: Medicare Other | Source: Ambulatory Visit | Attending: Interventional Radiology | Admitting: Interventional Radiology

## 2020-12-06 DIAGNOSIS — K810 Acute cholecystitis: Secondary | ICD-10-CM | POA: Diagnosis not present

## 2020-12-06 HISTORY — PX: IR CHOLANGIOGRAM EXISTING TUBE: IMG6040

## 2020-12-06 MED ORDER — IOHEXOL 350 MG/ML SOLN
5.0000 mL | Freq: Once | INTRAVENOUS | Status: AC | PRN
  Administered 2020-12-06: 5 mL

## 2020-12-06 MED ORDER — LIDOCAINE HCL (PF) 1 % IJ SOLN
INTRAMUSCULAR | Status: DC | PRN
  Administered 2020-12-06: 2 mL via SUBCUTANEOUS

## 2021-01-15 ENCOUNTER — Inpatient Hospital Stay: Payer: Medicare Other

## 2021-01-15 ENCOUNTER — Emergency Department: Payer: Medicare Other

## 2021-01-15 ENCOUNTER — Inpatient Hospital Stay
Admission: EM | Admit: 2021-01-15 | Discharge: 2021-01-31 | DRG: 872 | Disposition: A | Discharge status: Home Health | Payer: Medicare Other | Attending: Internal Medicine | Admitting: Internal Medicine

## 2021-01-15 ENCOUNTER — Other Ambulatory Visit: Payer: Self-pay

## 2021-01-15 DIAGNOSIS — E039 Hypothyroidism, unspecified: Secondary | ICD-10-CM | POA: Diagnosis present

## 2021-01-15 DIAGNOSIS — K7460 Unspecified cirrhosis of liver: Secondary | ICD-10-CM | POA: Diagnosis present

## 2021-01-15 DIAGNOSIS — Z7901 Long term (current) use of anticoagulants: Secondary | ICD-10-CM

## 2021-01-15 DIAGNOSIS — I2510 Atherosclerotic heart disease of native coronary artery without angina pectoris: Secondary | ICD-10-CM | POA: Diagnosis present

## 2021-01-15 DIAGNOSIS — Z7982 Long term (current) use of aspirin: Secondary | ICD-10-CM

## 2021-01-15 DIAGNOSIS — K819 Cholecystitis, unspecified: Secondary | ICD-10-CM

## 2021-01-15 DIAGNOSIS — K219 Gastro-esophageal reflux disease without esophagitis: Secondary | ICD-10-CM | POA: Diagnosis present

## 2021-01-15 DIAGNOSIS — Z9989 Dependence on other enabling machines and devices: Secondary | ICD-10-CM

## 2021-01-15 DIAGNOSIS — K810 Acute cholecystitis: Secondary | ICD-10-CM | POA: Diagnosis present

## 2021-01-15 DIAGNOSIS — I700 Atherosclerosis of aorta: Secondary | ICD-10-CM | POA: Diagnosis present

## 2021-01-15 DIAGNOSIS — Z882 Allergy status to sulfonamides status: Secondary | ICD-10-CM

## 2021-01-15 DIAGNOSIS — Z20822 Contact with and (suspected) exposure to covid-19: Secondary | ICD-10-CM | POA: Diagnosis present

## 2021-01-15 DIAGNOSIS — I5032 Chronic diastolic (congestive) heart failure: Secondary | ICD-10-CM | POA: Diagnosis present

## 2021-01-15 DIAGNOSIS — Z8616 Personal history of COVID-19: Secondary | ICD-10-CM

## 2021-01-15 DIAGNOSIS — A419 Sepsis, unspecified organism: Principal | ICD-10-CM | POA: Diagnosis present

## 2021-01-15 DIAGNOSIS — R7989 Other specified abnormal findings of blood chemistry: Secondary | ICD-10-CM

## 2021-01-15 DIAGNOSIS — Z9884 Bariatric surgery status: Secondary | ICD-10-CM

## 2021-01-15 DIAGNOSIS — Z515 Encounter for palliative care: Secondary | ICD-10-CM | POA: Diagnosis not present

## 2021-01-15 DIAGNOSIS — I10 Essential (primary) hypertension: Secondary | ICD-10-CM | POA: Diagnosis not present

## 2021-01-15 DIAGNOSIS — I4892 Unspecified atrial flutter: Secondary | ICD-10-CM | POA: Diagnosis present

## 2021-01-15 DIAGNOSIS — E669 Obesity, unspecified: Secondary | ICD-10-CM | POA: Diagnosis present

## 2021-01-15 DIAGNOSIS — Z6831 Body mass index (BMI) 31.0-31.9, adult: Secondary | ICD-10-CM

## 2021-01-15 DIAGNOSIS — M25511 Pain in right shoulder: Secondary | ICD-10-CM | POA: Diagnosis present

## 2021-01-15 DIAGNOSIS — J449 Chronic obstructive pulmonary disease, unspecified: Secondary | ICD-10-CM | POA: Diagnosis present

## 2021-01-15 DIAGNOSIS — Z66 Do not resuscitate: Secondary | ICD-10-CM | POA: Diagnosis present

## 2021-01-15 DIAGNOSIS — I471 Supraventricular tachycardia: Secondary | ICD-10-CM | POA: Diagnosis not present

## 2021-01-15 DIAGNOSIS — Z86718 Personal history of other venous thrombosis and embolism: Secondary | ICD-10-CM

## 2021-01-15 DIAGNOSIS — E7800 Pure hypercholesterolemia, unspecified: Secondary | ICD-10-CM | POA: Diagnosis present

## 2021-01-15 DIAGNOSIS — Z79899 Other long term (current) drug therapy: Secondary | ICD-10-CM

## 2021-01-15 DIAGNOSIS — E1142 Type 2 diabetes mellitus with diabetic polyneuropathy: Secondary | ICD-10-CM | POA: Diagnosis present

## 2021-01-15 DIAGNOSIS — I110 Hypertensive heart disease with heart failure: Secondary | ICD-10-CM | POA: Diagnosis present

## 2021-01-15 DIAGNOSIS — Z7189 Other specified counseling: Secondary | ICD-10-CM | POA: Diagnosis not present

## 2021-01-15 DIAGNOSIS — E871 Hypo-osmolality and hyponatremia: Secondary | ICD-10-CM | POA: Diagnosis present

## 2021-01-15 DIAGNOSIS — I82409 Acute embolism and thrombosis of unspecified deep veins of unspecified lower extremity: Secondary | ICD-10-CM | POA: Diagnosis present

## 2021-01-15 DIAGNOSIS — Z888 Allergy status to other drugs, medicaments and biological substances status: Secondary | ICD-10-CM

## 2021-01-15 DIAGNOSIS — K5900 Constipation, unspecified: Secondary | ICD-10-CM | POA: Diagnosis present

## 2021-01-15 DIAGNOSIS — Z886 Allergy status to analgesic agent status: Secondary | ICD-10-CM

## 2021-01-15 DIAGNOSIS — K8012 Calculus of gallbladder with acute and chronic cholecystitis without obstruction: Secondary | ICD-10-CM | POA: Diagnosis present

## 2021-01-15 DIAGNOSIS — E222 Syndrome of inappropriate secretion of antidiuretic hormone: Secondary | ICD-10-CM | POA: Diagnosis present

## 2021-01-15 DIAGNOSIS — Z885 Allergy status to narcotic agent status: Secondary | ICD-10-CM

## 2021-01-15 DIAGNOSIS — E119 Type 2 diabetes mellitus without complications: Secondary | ICD-10-CM | POA: Diagnosis not present

## 2021-01-15 DIAGNOSIS — Z953 Presence of xenogenic heart valve: Secondary | ICD-10-CM

## 2021-01-15 DIAGNOSIS — Z79890 Hormone replacement therapy (postmenopausal): Secondary | ICD-10-CM

## 2021-01-15 DIAGNOSIS — Z794 Long term (current) use of insulin: Secondary | ICD-10-CM | POA: Diagnosis not present

## 2021-01-15 DIAGNOSIS — G4733 Obstructive sleep apnea (adult) (pediatric): Secondary | ICD-10-CM | POA: Diagnosis present

## 2021-01-15 DIAGNOSIS — R531 Weakness: Secondary | ICD-10-CM

## 2021-01-15 DIAGNOSIS — E876 Hypokalemia: Secondary | ICD-10-CM | POA: Diagnosis present

## 2021-01-15 DIAGNOSIS — Z9049 Acquired absence of other specified parts of digestive tract: Secondary | ICD-10-CM

## 2021-01-15 DIAGNOSIS — R1011 Right upper quadrant pain: Secondary | ICD-10-CM

## 2021-01-15 DIAGNOSIS — D72829 Elevated white blood cell count, unspecified: Secondary | ICD-10-CM | POA: Diagnosis present

## 2021-01-15 LAB — PROTIME-INR
INR: 1.1 (ref 0.8–1.2)
Prothrombin Time: 13.7 seconds (ref 11.4–15.2)

## 2021-01-15 LAB — CBC
HCT: 38.6 % (ref 36.0–46.0)
Hemoglobin: 13.1 g/dL (ref 12.0–15.0)
MCH: 29.6 pg (ref 26.0–34.0)
MCHC: 33.9 g/dL (ref 30.0–36.0)
MCV: 87.1 fL (ref 80.0–100.0)
Platelets: 128 10*3/uL — ABNORMAL LOW (ref 150–400)
RBC: 4.43 MIL/uL (ref 3.87–5.11)
RDW: 13.3 % (ref 11.5–15.5)
WBC: 14.2 10*3/uL — ABNORMAL HIGH (ref 4.0–10.5)
nRBC: 0 % (ref 0.0–0.2)

## 2021-01-15 LAB — COMPREHENSIVE METABOLIC PANEL
ALT: 23 U/L (ref 0–44)
AST: 31 U/L (ref 15–41)
Albumin: 3.9 g/dL (ref 3.5–5.0)
Alkaline Phosphatase: 99 U/L (ref 38–126)
Anion gap: 8 (ref 5–15)
BUN: 16 mg/dL (ref 8–23)
CO2: 23 mmol/L (ref 22–32)
Calcium: 9.7 mg/dL (ref 8.9–10.3)
Chloride: 101 mmol/L (ref 98–111)
Creatinine, Ser: 0.69 mg/dL (ref 0.44–1.00)
GFR, Estimated: >60 mL/min (ref >60)
Glucose, Bld: 210 mg/dL — ABNORMAL HIGH (ref 70–99)
Potassium: 4.2 mmol/L (ref 3.5–5.1)
Sodium: 132 mmol/L — ABNORMAL LOW (ref 135–145)
Total Bilirubin: 1.2 mg/dL (ref 0.3–1.2)
Total Protein: 6.8 g/dL (ref 6.5–8.1)

## 2021-01-15 LAB — BRAIN NATRIURETIC PEPTIDE: B Natriuretic Peptide: 259.3 pg/mL — ABNORMAL HIGH (ref 0.0–100.0)

## 2021-01-15 LAB — RESP PANEL BY RT-PCR (FLU A&B, COVID) ARPGX2
Influenza A by PCR: NEGATIVE
Influenza B by PCR: NEGATIVE
SARS Coronavirus 2 by RT PCR: NEGATIVE

## 2021-01-15 LAB — PROCALCITONIN: Procalcitonin: 0.18 ng/mL

## 2021-01-15 LAB — LACTIC ACID, PLASMA
Lactic Acid, Venous: 2.3 mmol/L (ref 0.5–1.9)
Lactic Acid, Venous: 1.3 mmol/L (ref 0.5–1.9)

## 2021-01-15 LAB — GLUCOSE, CAPILLARY
Glucose-Capillary: 195 mg/dL — ABNORMAL HIGH (ref 70–99)
Glucose-Capillary: 192 mg/dL — ABNORMAL HIGH (ref 70–99)

## 2021-01-15 LAB — TROPONIN I (HIGH SENSITIVITY): Troponin I (High Sensitivity): 10 ng/L (ref <18)

## 2021-01-15 LAB — LIPASE, BLOOD: Lipase: 28 U/L (ref 11–51)

## 2021-01-15 LAB — APTT: aPTT: 136 seconds — ABNORMAL HIGH (ref 24–36)

## 2021-01-15 MED ORDER — LEVALBUTEROL HCL 0.63 MG/3ML IN NEBU
0.6300 mg | INHALATION_SOLUTION | Freq: Four times a day (QID) | RESPIRATORY_TRACT | Status: DC | PRN
  Administered 2021-01-21 – 2021-01-24 (×4): 0.63 mg via RESPIRATORY_TRACT
  Filled 2021-01-15 (×5): qty 3

## 2021-01-15 MED ORDER — PIPERACILLIN-TAZOBACTAM 3.375 G IVPB
3.3750 g | Freq: Three times a day (TID) | INTRAVENOUS | Status: DC
  Administered 2021-01-15 – 2021-01-20 (×14): 3.375 g via INTRAVENOUS
  Filled 2021-01-15 (×16): qty 50

## 2021-01-15 MED ORDER — HYDROMORPHONE HCL 1 MG/ML IJ SOLN
0.5000 mg | Freq: Once | INTRAMUSCULAR | Status: AC
  Administered 2021-01-15: 0.5 mg via INTRAVENOUS
  Filled 2021-01-15: qty 1

## 2021-01-15 MED ORDER — HEPARIN (PORCINE) 25000 UT/250ML-% IV SOLN
INTRAVENOUS | Status: AC
  Administered 2021-01-15: 1100 [IU]/h via INTRAVENOUS
  Filled 2021-01-15: qty 250

## 2021-01-15 MED ORDER — IPRATROPIUM-ALBUTEROL 20-100 MCG/ACT IN AERS: 1.0000 | INHALATION_SPRAY | Freq: Four times a day (QID) | RESPIRATORY_TRACT | Status: DC | PRN

## 2021-01-15 MED ORDER — HYDRALAZINE HCL 20 MG/ML IJ SOLN: 5.0000 mg | INTRAMUSCULAR | Status: DC | PRN

## 2021-01-15 MED ORDER — HEPARIN (PORCINE) 25000 UT/250ML-% IV SOLN
1250.0000 [IU]/h | INTRAVENOUS | Status: DC
  Administered 2021-01-16: 1250 [IU]/h via INTRAVENOUS
  Filled 2021-01-15: qty 250

## 2021-01-15 MED ORDER — INSULIN ASPART 100 UNIT/ML IJ SOLN
0.0000 [IU] | Freq: Every day | INTRAMUSCULAR | Status: DC
  Administered 2021-01-24 – 2021-01-29 (×5): 2 [IU] via SUBCUTANEOUS
  Filled 2021-01-15 (×5): qty 1

## 2021-01-15 MED ORDER — PIPERACILLIN-TAZOBACTAM 3.375 G IVPB 30 MIN
3.3750 g | Freq: Once | INTRAVENOUS | Status: AC
  Administered 2021-01-15: 3.375 g via INTRAVENOUS
  Filled 2021-01-15: qty 50

## 2021-01-15 MED ORDER — FENTANYL CITRATE PF 50 MCG/ML IJ SOSY
12.5000 ug | PREFILLED_SYRINGE | INTRAMUSCULAR | Status: DC | PRN
  Administered 2021-01-15: 12.5 ug via INTRAVENOUS
  Filled 2021-01-15 (×2): qty 1

## 2021-01-15 MED ORDER — PANTOPRAZOLE SODIUM 20 MG PO TBEC
20.0000 mg | DELAYED_RELEASE_TABLET | Freq: Every day | ORAL | Status: DC
Start: 2021-01-15 — End: 2021-01-31
  Administered 2021-01-15 – 2021-01-31 (×16): 20 mg via ORAL
  Filled 2021-01-15 (×20): qty 1

## 2021-01-15 MED ORDER — MIRABEGRON ER 25 MG PO TB24
25.0000 mg | ORAL_TABLET | Freq: Every day | ORAL | Status: DC
  Administered 2021-01-16: 25 mg via ORAL
  Filled 2021-01-15 (×2): qty 1

## 2021-01-15 MED ORDER — IPRATROPIUM-ALBUTEROL 0.5-2.5 (3) MG/3ML IN SOLN
3.0000 mL | Freq: Four times a day (QID) | RESPIRATORY_TRACT | Status: DC | PRN
  Administered 2021-01-18: 3 mL via RESPIRATORY_TRACT

## 2021-01-15 MED ORDER — INSULIN GLARGINE-YFGN 100 UNIT/ML ~~LOC~~ SOLN
10.0000 [IU] | Freq: Every day | SUBCUTANEOUS | Status: DC
  Administered 2021-01-16 – 2021-01-31 (×16): 10 [IU] via SUBCUTANEOUS
  Filled 2021-01-15 (×17): qty 0.1

## 2021-01-15 MED ORDER — ACETAMINOPHEN 325 MG PO TABS
650.0000 mg | ORAL_TABLET | Freq: Four times a day (QID) | ORAL | Status: DC | PRN
  Administered 2021-01-16 – 2021-01-30 (×4): 650 mg via ORAL
  Filled 2021-01-15 (×4): qty 2

## 2021-01-15 MED ORDER — DM-GUAIFENESIN ER 30-600 MG PO TB12: 1.0000 | ORAL_TABLET | Freq: Two times a day (BID) | ORAL | Status: DC | PRN

## 2021-01-15 MED ORDER — ONDANSETRON HCL 4 MG/2ML IJ SOLN
4.0000 mg | Freq: Once | INTRAMUSCULAR | Status: AC
  Administered 2021-01-15: 4 mg via INTRAVENOUS
  Filled 2021-01-15: qty 2

## 2021-01-15 MED ORDER — METOPROLOL SUCCINATE ER 50 MG PO TB24
50.0000 mg | ORAL_TABLET | Freq: Every day | ORAL | Status: DC
  Administered 2021-01-15 – 2021-01-31 (×15): 50 mg via ORAL
  Filled 2021-01-15 (×14): qty 1

## 2021-01-15 MED ORDER — INSULIN GLARGINE-YFGN 100 UNIT/ML ~~LOC~~ SOLN: 5.0000 [IU] | Freq: Two times a day (BID) | SUBCUTANEOUS | Status: DC

## 2021-01-15 MED ORDER — HEPARIN BOLUS VIA INFUSION
4000.0000 [IU] | Freq: Once | INTRAVENOUS | Status: AC
  Administered 2021-01-15: 4000 [IU] via INTRAVENOUS
  Filled 2021-01-15: qty 4000

## 2021-01-15 MED ORDER — MONTELUKAST SODIUM 10 MG PO TABS
10.0000 mg | ORAL_TABLET | Freq: Every day | ORAL | Status: DC
  Administered 2021-01-15 – 2021-01-30 (×16): 10 mg via ORAL
  Filled 2021-01-15 (×16): qty 1

## 2021-01-15 MED ORDER — INSULIN GLARGINE-YFGN 100 UNIT/ML ~~LOC~~ SOLN
5.0000 [IU] | Freq: Every day | SUBCUTANEOUS | Status: DC
  Administered 2021-01-15 – 2021-01-30 (×16): 5 [IU] via SUBCUTANEOUS
  Filled 2021-01-15 (×17): qty 0.05

## 2021-01-15 MED ORDER — SODIUM CHLORIDE 0.9 % IV SOLN: INTRAVENOUS | Status: DC

## 2021-01-15 MED ORDER — FENTANYL CITRATE PF 50 MCG/ML IJ SOSY
50.0000 ug | PREFILLED_SYRINGE | Freq: Once | INTRAMUSCULAR | Status: AC
  Administered 2021-01-15: 50 ug via INTRAVENOUS
  Filled 2021-01-15: qty 1

## 2021-01-15 MED ORDER — GABAPENTIN 100 MG PO CAPS
100.0000 mg | ORAL_CAPSULE | Freq: Four times a day (QID) | ORAL | Status: DC
  Administered 2021-01-15 – 2021-01-31 (×50): 100 mg via ORAL
  Filled 2021-01-15 (×51): qty 1

## 2021-01-15 MED ORDER — INSULIN ASPART 100 UNIT/ML IJ SOLN
0.0000 [IU] | Freq: Three times a day (TID) | INTRAMUSCULAR | Status: DC
  Administered 2021-01-15: 2 [IU] via SUBCUTANEOUS
  Administered 2021-01-16 – 2021-01-19 (×9): 1 [IU] via SUBCUTANEOUS
  Administered 2021-01-20 (×3): 2 [IU] via SUBCUTANEOUS
  Administered 2021-01-21: 5 [IU] via SUBCUTANEOUS
  Administered 2021-01-21: 3 [IU] via SUBCUTANEOUS
  Administered 2021-01-21: 1 [IU] via SUBCUTANEOUS
  Administered 2021-01-22 (×3): 2 [IU] via SUBCUTANEOUS
  Administered 2021-01-23: 5 [IU] via SUBCUTANEOUS
  Administered 2021-01-23: 1 [IU] via SUBCUTANEOUS
  Administered 2021-01-23: 2 [IU] via SUBCUTANEOUS
  Administered 2021-01-24: 3 [IU] via SUBCUTANEOUS
  Administered 2021-01-24 – 2021-01-25 (×3): 2 [IU] via SUBCUTANEOUS
  Administered 2021-01-26 (×2): 1 [IU] via SUBCUTANEOUS
  Administered 2021-01-26 – 2021-01-27 (×4): 2 [IU] via SUBCUTANEOUS
  Administered 2021-01-28: 1 [IU] via SUBCUTANEOUS
  Administered 2021-01-28 – 2021-01-29 (×3): 2 [IU] via SUBCUTANEOUS
  Administered 2021-01-29 – 2021-01-30 (×4): 1 [IU] via SUBCUTANEOUS
  Administered 2021-01-30: 2 [IU] via SUBCUTANEOUS
  Administered 2021-01-31: 1 [IU] via SUBCUTANEOUS
  Administered 2021-01-31: 2 [IU] via SUBCUTANEOUS
  Administered 2021-01-31: 1 [IU] via SUBCUTANEOUS
  Filled 2021-01-15 (×44): qty 1

## 2021-01-15 MED ORDER — LEVOTHYROXINE SODIUM 25 MCG PO TABS
125.0000 ug | ORAL_TABLET | Freq: Every day | ORAL | Status: DC
  Administered 2021-01-16 – 2021-01-31 (×15): 125 ug via ORAL
  Filled 2021-01-15 (×16): qty 1

## 2021-01-15 MED ORDER — HYDROMORPHONE HCL 1 MG/ML IJ SOLN
1.0000 mg | INTRAMUSCULAR | Status: AC
  Administered 2021-01-15: 1 mg via INTRAVENOUS
  Filled 2021-01-15: qty 1

## 2021-01-15 MED ORDER — ASPIRIN EC 81 MG PO TBEC
81.0000 mg | DELAYED_RELEASE_TABLET | Freq: Every day | ORAL | Status: DC
  Administered 2021-01-16 – 2021-01-30 (×15): 81 mg via ORAL
  Filled 2021-01-15 (×16): qty 1

## 2021-01-15 MED ORDER — UBIQUINOL 300 MG PO CAPS: 1.0000 | ORAL_CAPSULE | Freq: Every day | ORAL | Status: DC

## 2021-01-15 MED ORDER — ADULT MULTIVITAMIN W/MINERALS CH
ORAL_TABLET | Freq: Every day | ORAL | Status: DC
  Administered 2021-01-16 – 2021-01-31 (×16): 1 via ORAL
  Filled 2021-01-15 (×16): qty 1

## 2021-01-15 MED ORDER — ONDANSETRON HCL 4 MG/2ML IJ SOLN
4.0000 mg | Freq: Three times a day (TID) | INTRAMUSCULAR | Status: DC | PRN
  Administered 2021-01-15 – 2021-01-25 (×5): 4 mg via INTRAVENOUS
  Filled 2021-01-15 (×6): qty 2

## 2021-01-15 NOTE — ED Triage Notes (Signed)
See first nurse note- pt rocking in wheelchair complaining of right sided abdominal pain and left shoulder pain. Reports that the shoulder pain started approx 10 minute ago.

## 2021-01-15 NOTE — Progress Notes (Signed)
This is a patient well-known to me from a prior episode of acute calculus cholecystitis.  She is an extremely poor surgical candidate with prohibitive operative risk.  A percutaneous cholecystostomy tube was placed at the time of her prior presentation to the hospital.  At that time, she also had COVID-19.  After discharge, she was seen in my clinic and underwent a follow-up cholangiogram via her existing tube.  The cystic duct appeared to be patent without evidence of obstructing choledocholithiasis and the tube was capped.  She was then seen again by interventional radiology.  They discussed an ideal option would be to have her gallbladder removed, but if this was medically prohibitive, drain removal could be considered versus Spyglass gallstone extraction followed by drain removal.  I confirmed that the patient is not a candidate for cholecystectomy due to her multiple comorbid medical conditions.  The patient also refused to have the Spyglass procedure and insisted that she would take care of the stones herself by means of a "cleanse.".  She insisted that the tube be removed.  This was done in accordance with her wishes.  She now presents to the emergency department once again with right upper quadrant pain.  An ultrasound was performed that demonstrated a distended gallbladder with wall thickening and dependent stones and sludge, as well as a small amount of pericholecystic fluid.  The findings were similar to her prior presentation in June and support acute cholecystitis in the proper clinical setting.  CT scan was also performed with similar results.  There is no evidence that her comorbid conditions have improved in the interim.  Recommend admission to hospital medicine with plans for likely percutaneous cholecystostomy tube placement and reevaluation of the patient's candidacy for Spyglass percutaneous stone extraction.

## 2021-01-15 NOTE — Consult Note (Signed)
ANTICOAGULATION CONSULT NOTE - Initial Consult  Pharmacy Consult for heparin infusion Indication: DVT  Allergies  Allergen Reactions   Ropinirole Nausea And Vomiting   Azelastine Other (See Comments)    Closes airways  Closes airways     Bupropion     GI issues    Calcium     Chest pain    Carbidopa-Levodopa Other (See Comments)    Patient reports chest pain, back pain and left side pain   Cefuroxime     Swelling    Cephalexin    Clinoril [Sulindac]     GI   Codeine Hives   Duloxetine     Bleeding    Duloxetine Hcl Other (See Comments)    Bleeding   Ezetimibe     Joint pain    Fenofibrate     Leg cramps   Furosemide     Fluid retention    Glipizide     Bloating    Lorazepam     SI   Lortab [Hydrocodone-Acetaminophen] Hives   Metaproterenol     Palpitations    Morphine And Related    Nalbuphine     Rapid heart rate  flushing    Naproxen     Numbness    Norfloxacin     Urinary retention    Rofecoxib    Sodium    Statins     Muscle pain    Sulfa Antibiotics     Unknown    Sulfasalazine Other (See Comments)    Unknown   Tramadol    Trazodone And Nefazodone    Doxycycline Rash   Iodine Rash   Ketoprofen Rash   Piroxicam Rash   Tolmetin Rash    Patient Measurements: Height: 5\' 3"  (160 cm) Weight: 79.8 kg (176 lb) IBW/kg (Calculated) : 52.4 Heparin Dosing Weight: 69.8 kg   Vital Signs: Temp: 97.7 F (36.5 C) (09/25 1255) Temp Source: Oral (09/25 1255) BP: 157/67 (09/25 1430) Pulse Rate: 91 (09/25 1430)  Labs: Recent Labs    01/15/21 1251  HGB 13.1  HCT 38.6  PLT 128*  CREATININE 0.69  TROPONINIHS 10    Estimated Creatinine Clearance: 58 mL/min (by C-G formula based on SCr of 0.69 mg/dL).   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 last one approx 2013   Family history of adverse reaction to anesthesia    Mother - temporary paralysis of 1 side   GERD (gastroesophageal reflux disease)    Hypertension    Neuropathy    feet   PONV (postoperative nausea and vomiting)    Pt reports violent vomiting with ANY pain meds given with anesthesia.   Sleep apnea    CPAP   Thyroid disease    Vertigo    daily    Medications:  Rivaroxaban 20 mg daily PTA -- last dose ~ 4 days ago per patient   Assessment: a 80 y.o. female with PMH of DVT on Xarelto, last dose > 72 hours ago per patient. Admitted with abdominal pain. Surgery consulted. Pharmacy consulted to transition to heparin infusion.  Goal of Therapy:  Heparin level 0.3-0.7 units/ml aPTT 66-102 seconds Monitor platelets by anticoagulation protocol: Yes   Plan:  Give 4000 units bolus x 1 Start heparin  infusion at 1100 units/hr Check anti-Xa/aPTT level in 8 hours and daily while on heparin Continue to monitor H&H and platelets  Dorothe Pea, PharmD, BCPS Clinical Pharmacist   01/15/2021,5:37 PM

## 2021-01-15 NOTE — H&P (Signed)
History and Physical    JEWELIANA DUDGEON RKY:[id number] DOB: 04/13/1941 DOA: 01/15/2021  Referring MD/NP/PA:   PCP: Kirk Ruths, MD   Patient coming from:  The patient is coming from home.   Chief Complaint: Abdominal pain  HPI: Gina Chambers is a 80 y.o. female with medical history significant of hypertension, diabetes mellitus, COPD, asthma, GERD, hypothyroidism, OSA on CPAP, DVT on Xarelto, dCHF, vertigo, GI bleeding, COVID-19 infection, cholecystitis, who presents with abdominal pain.  Her abdominal pain started this morning, which is located in the right upper quadrant, constant, sharp, severe, radiating to the right shoulder.  Patient complains of right shoulder pain, particularly on movement.  Associated with nausea and nonbilious nonbloody vomiting for more than 10 times.  No fever or chills.  No diarrhea.  Patient denies chest pain, cough, shortness breath.  No symptoms of UTI.  Of note, pt had hx of cholecystitis in June 2022. She was treated with IV Zosyn in hospital and discharged on Augmentin.  She had a Coley ostomy tube placed, which was supposed to remain in place for 6 to 8 weeks. Pt states that she had tube in place longer than that.  ED Course: pt was found to have WBC 14.2, troponin level 10, negative COVID PCR, renal function okay, temperature normal, blood pressure 151/66, heart rate 95, RR 20, oxygen saturation 99% on room air.  Chest x-ray negative.  CT scan of abdomen/pelvis and right upper quadrant ultrasound were performed, both are suggestive for acute cholecystitis.  Patient is admitted to [facility] bed as inpatient.  General surgeon Dr. Celine Ahr was consulted.  CT-abd/pelvis: 1. Appearance of the gallbladder remains suspicious for acute cholecystitis.  2. Small volume perihepatic ascites.  3. Aortic Atherosclerosis (ICD10-I70.0).  US-RUQ: 1. Distended gallbladder with wall thickening and dependent stones and sludge as well as a small amount of  pericholecystic fluid. Findings are similar to the prior ultrasound and support acute cholecystitis in the proper clinical setting.  Review of Systems:   General: no fevers, chills, no body weight gain, has poor appetite, has fatigue HEENT: no blurry vision, hearing changes or sore throat Respiratory: no dyspnea, coughing, wheezing CV: no chest pain, no palpitations GI: has nausea, vomiting, abdominal pain, no diarrhea, constipation GU: no dysuria, burning on urination, increased urinary frequency, hematuria  Ext: has trace leg edema Neuro: no unilateral weakness, numbness, or tingling, no vision change or hearing loss Skin: no rash, no skin tear. MSK: No muscle spasm, no deformity, no limitation of range of movement in spin Heme: No easy bruising.  Travel history: No recent long distant travel.  Allergy:  Allergies  Allergen Reactions   Ropinirole Nausea And Vomiting   Azelastine Other (See Comments)    Closes airways  Closes airways     Bupropion     GI issues    Calcium     Chest pain    Carbidopa-Levodopa Other (See Comments)    Patient reports chest pain, back pain and left side pain   Cefuroxime     Swelling    Cephalexin    Clinoril [Sulindac]     GI   Codeine Hives   Duloxetine     Bleeding    Duloxetine Hcl Other (See Comments)    Bleeding   Ezetimibe     Joint pain    Fenofibrate     Leg cramps   Furosemide     Fluid retention    Glipizide     Bloating  Lorazepam     SI   Lortab [Hydrocodone-Acetaminophen] Hives   Metaproterenol     Palpitations    Morphine And Related    Nalbuphine     Rapid heart rate  flushing    Naproxen     Numbness    Norfloxacin     Urinary retention    Rofecoxib    Sodium    Statins     Muscle pain    Sulfa Antibiotics     Unknown    Sulfasalazine Other (See Comments)    Unknown   Tramadol    Trazodone And Nefazodone    Doxycycline Rash   Iodine Rash   Ketoprofen Rash   Piroxicam Rash    Tolmetin Rash    Past Medical History:  Diagnosis Date   Asthma    Asthmatic bronchitis , chronic (HCC)    CHF (congestive heart failure) (HCC)    Diabetes mellitus without complication ([place])    DVT (deep venous thrombosis) (HCC)    x3 - both legs. last one approx 2013   Family history of adverse reaction to anesthesia    Mother - temporary paralysis of 1 side   GERD (gastroesophageal reflux disease)    Hypertension    Neuropathy    feet   PONV (postoperative nausea and vomiting)    Pt reports violent vomiting with ANY pain meds given with anesthesia.   Sleep apnea    CPAP   Thyroid disease    Vertigo    daily    Past Surgical History:  Procedure Laterality Date   CARDIAC SURGERY     CATARACT EXTRACTION W/PHACO Right 03/11/2017   Procedure: CATARACT EXTRACTION PHACO AND INTRAOCULAR LENS PLACEMENT ([place])  RIGHT DIABETIC;  Surgeon: Eulogio Bear, MD;  Location: [facility];  Service: Ophthalmology;  Laterality: Right;  Diabetic - insulin sleep apnea   CATARACT EXTRACTION W/PHACO Left 04/02/2017   Procedure: CATARACT EXTRACTION PHACO AND INTRAOCULAR LENS PLACEMENT ([place]) LEFT DIABETES;  Surgeon: Eulogio Bear, MD;  Location: [facility];  Service: Ophthalmology;  Laterality: Left;  Diabetic - insulin   COLONOSCOPY WITH PROPOFOL N/A 11/09/2016   Procedure: COLONOSCOPY WITH PROPOFOL;  Surgeon: Jonathon Bellows, MD;  Location[facility] ENDOSCOPY;  Service: Gastroenterology;  Laterality: N/A;   HERNIA REPAIR     IR CHOLANGIOGRAM EXISTING TUBE  12/06/2020   IR RADIOLOGIST EVAL & MGMT  11/23/2020   VERTICAL BANDED GASTROPLASTY      Social History:  reports that she has never smoked. She has never used smokeless tobacco. She reports that she does not drink alcohol and does not use drugs.  Family History:  Family History  Problem Relation Age of Onset   Breast cancer Mother        32's   Breast cancer Maternal Grandmother    Breast cancer Paternal Grandmother       Prior to Admission medications   Medication Sig Start Date End Date Taking? Authorizing Provider  aspirin EC 81 MG tablet Take 81 mg by mouth at bedtime.    [provider]  bumetanide (BUMEX) 0.5 MG tablet Take 0.5-1 mg by mouth See admin instructions. Take 2 tablets (1mg ) in the morning and 1 tablet (0.5 mg) in the evening    [provider]  Chromium Picolinate 200 MCG CAPS Take 200 mcg by mouth daily.    [provider]  diphenhydrAMINE (BENADRYL) 25 mg capsule Take 25 mg by mouth every 6 (six) hours as needed. 04/27/19  [provider]  fluticasone (FLOVENT HFA) 110 MCG/ACT inhaler Inhale 2 puffs into the lungs 2 (two) times daily.     [provider]  gabapentin (NEURONTIN) 100 MG capsule Take 200 mg by mouth at bedtime. 02/18/20 02/17/21  [provider]  insulin glargine (LANTUS) 100 UNIT/ML injection Inject 20 Units into the skin See admin instructions. Takes 20 units in the morning,Takes lantus at night based on sliding scale    [provider]  insulin regular (NOVOLIN R,HUMULIN R) 250 units/2.2mL (100 units/mL) injection Inject 10 Units into the skin 4 (four) times daily. Only uses Sliding scale at night    [provider]  Ipratropium-Albuterol (COMBIVENT) 20-100 MCG/ACT AERS respimat Inhale 1 puff into the lungs 4 (four) times daily as needed for wheezing or shortness of breath.    [provider]  lansoprazole (PREVACID) 30 MG capsule Take 30 mg by mouth 2 (two) times daily.    [provider]  levalbuterol Penne Lash) 0.63 MG/3ML nebulizer solution Inhale 0.63 mg into the lungs every 6 (six) hours as needed. 04/27/19   [provider]  levothyroxine (SYNTHROID, LEVOTHROID) 125 MCG tablet Take 125 mcg by mouth daily.    [provider]  Magnesium 500 MG TABS Take 500 mg by mouth 4 (four) times daily.     [provider]  metoprolol succinate (TOPROL-XL) 50 MG 24 hr tablet  Take 50 mg by mouth daily. Take with or immediately following a meal.    [provider]  montelukast (SINGULAIR) 10 MG tablet Take 10 mg by mouth at bedtime.    [provider]  Multiple Vitamins-Minerals (CENTRUM SILVER PO) Take by mouth.    [provider]  Multiple Vitamins-Minerals (PRESERVISION AREDS 2 PO) Take 1 tablet by mouth 2 (two) times daily.    [provider]  MYRBETRIQ 25 MG TB24 tablet Take 25 mg by mouth daily. 12/21/19   [provider]  UBIQUINOL PO Use 300 mg    [provider]  VITAMIN E PO Take by mouth.    [provider]  XARELTO 20 MG TABS tablet Take 20 mg by mouth daily. 02/20/20   [provider]  zinc gluconate 1.5 mg/mL SOLN Take by mouth.    [provider]    Physical Exam: Vitals:   01/15/21 1330 01/15/21 1345 01/15/21 1400 01/15/21 1430  BP: (!) 151/66  (!) 150/69 (!) 157/67  Pulse:  84 85 91  Resp:      Temp:      TempSrc:      SpO2:  99% 100% 96%  Weight:      Height:       General: Not in acute distress HEENT:       Eyes: PERRL, EOMI, no scleral icterus.       ENT: No discharge from the ears and nose, no pharynx injection, no tonsillar enlargement.        Neck: No JVD, no bruit, no mass felt. Heme: No neck lymph node enlargement. Cardiac: S1/S2, RRR, No murmurs, No gallops or rubs. Respiratory: No rales, wheezing, rhonchi or rubs. GI: Soft, nondistended, has tenderness in RUQ, no rebound pain, no organomegaly, BS present. GU: No hematuria Ext: has trace leg edema bilaterally. 1+DP/PT pulse bilaterally. Musculoskeletal: No joint deformities, No joint redness or warmth, no limitation of ROM in spin. Skin: No rashes.  Neuro: Alert, oriented X3, cranial nerves II-XII grossly intact, moves all extremities normally.  Psych: Patient is not psychotic,  no suicidal or hemocidal ideation.  Labs on Admission: I have personally reviewed following labs and imaging  studies  CBC: Recent Labs  Lab 01/15/21 1251  WBC 14.2*  HGB 13.1  HCT 38.6  MCV 87.1  PLT 295*   Basic Metabolic Panel: Recent Labs  Lab 01/15/21 1251  NA 132*  K 4.2  CL 101  CO2 23  GLUCOSE 210*  BUN 16  CREATININE 0.69  CALCIUM 9.7   GFR: Estimated Creatinine Clearance: 58 mL/min (by C-G formula based on SCr of 0.69 mg/dL). Liver Function Tests: Recent Labs  Lab 01/15/21 1251  AST 31  ALT 23  ALKPHOS 99  BILITOT 1.2  PROT 6.8  ALBUMIN 3.9   Recent Labs  Lab 01/15/21 1251  LIPASE 28   No results for input(s): AMMONIA in the last 168 hours. Coagulation Profile: No results for input(s): INR, PROTIME in the last 168 hours. Cardiac Enzymes: No results for input(s): CKTOTAL, CKMB, CKMBINDEX, TROPONINI in the last 168 hours. BNP (last 3 results) No results for input(s): PROBNP in the last 8760 hours. HbA1C: No results for input(s): HGBA1C in the last 72 hours. CBG: No results for input(s): GLUCAP in the last 168 hours. Lipid Profile: No results for input(s): CHOL, HDL, LDLCALC, TRIG, CHOLHDL, LDLDIRECT in the last 72 hours. Thyroid Function Tests: No results for input(s): TSH, T4TOTAL, FREET4, T3FREE, THYROIDAB in the last 72 hours. Anemia Panel: No results for input(s): VITAMINB12, FOLATE, FERRITIN, TIBC, IRON, RETICCTPCT in the last 72 hours. Urine analysis:    Component Value Date/Time   COLORURINE YELLOW (A) 09/23/2020 1722   APPEARANCEUR CLEAR (A) 09/23/2020 1722   LABSPEC 1.005 09/23/2020 1722   PHURINE 5.0 09/23/2020 1722   GLUCOSEU NEGATIVE 09/23/2020 1722   HGBUR SMALL (A) 09/23/2020 1722   BILIRUBINUR NEGATIVE 09/23/2020 1722   KETONESUR NEGATIVE 09/23/2020 1722   PROTEINUR NEGATIVE 09/23/2020 1722   NITRITE POSITIVE (A) 09/23/2020 1722   LEUKOCYTESUR NEGATIVE 09/23/2020 1722   Sepsis Labs: @LABRCNTIP (procalcitonin:4,lacticidven:4) ) Recent Results (from the past 240 hour(s))  Resp Panel by RT-PCR (Flu A&B, Covid) Nasopharyngeal  Swab     Status: None   Collection Time: 01/15/21  2:41 PM   Specimen: Nasopharyngeal Swab; Nasopharyngeal(NP) swabs in vial transport medium  Result Value Ref Range Status   SARS Coronavirus 2 by RT PCR NEGATIVE NEGATIVE Final    Comment: (NOTE) SARS-CoV-2 target nucleic acids are NOT DETECTED.  The SARS-CoV-2 RNA is generally detectable in upper respiratory specimens during the acute phase of infection. The lowest concentration of SARS-CoV-2 viral copies this assay can detect is 138 copies/mL. A negative result does not preclude SARS-Cov-2 infection and should not be used as the sole basis for treatment or other patient management decisions. A negative result may occur with  improper specimen collection/handling, submission of specimen other than nasopharyngeal swab, presence of viral mutation(s) within the areas targeted by this assay, and inadequate number of viral copies(<138 copies/mL). A negative result must be combined with clinical observations, patient history, and epidemiological information. The expected result is Negative.  Fact Sheet for Patients:  EntrepreneurPulse.com.au  Fact Sheet for Healthcare Providers:  IncredibleEmployment.be  This test is no t yet approved or cleared by the [place] FDA and  has been authorized for detection and/or diagnosis of SARS-CoV-2 by FDA under an Emergency Use Authorization (EUA). This EUA will remain  in effect (meaning this test can be used) for the duration of the COVID-19 declaration under Section 564(b)(1) of the Act, 21  U.S.C.section 360bbb-3(b)(1), unless the authorization is terminated  or revoked sooner.       Influenza A by PCR NEGATIVE NEGATIVE Final   Influenza B by PCR NEGATIVE NEGATIVE Final    Comment: (NOTE) The Xpert Xpress SARS-CoV-2/FLU/RSV plus assay is intended as an aid in the diagnosis of influenza from Nasopharyngeal swab specimens and should not be used as a sole  basis for treatment. Nasal washings and aspirates are unacceptable for Xpert Xpress SARS-CoV-2/FLU/RSV testing.  Fact Sheet for Patients: EntrepreneurPulse.com.au  Fact Sheet for Healthcare Providers: IncredibleEmployment.be  This test is not yet approved or cleared by the [place] FDA and has been authorized for detection and/or diagnosis of SARS-CoV-2 by FDA under an Emergency Use Authorization (EUA). This EUA will remain in effect (meaning this test can be used) for the duration of the COVID-19 declaration under Section 564(b)(1) of the Act, 21 U.S.C. section 360bbb-3(b)(1), unless the authorization is terminated or revoked.  Performed at [facility], [address] [place], [place] [address]      Radiological Exams on Admission: CT ABDOMEN PELVIS WO CONTRAST  Result Date: 01/15/2021 CLINICAL DATA:  Abdominal distension and right upper quadrant abdominal pain. History of cholecystostomy tube removal on 12/06/2020 EXAM: CT ABDOMEN AND PELVIS WITHOUT CONTRAST TECHNIQUE: Multidetector CT imaging of the abdomen and pelvis was performed following the standard protocol without IV contrast. COMPARISON:  CT 09/23/2020, ultrasound 01/15/2021 FINDINGS: Lower chest: Bibasilar dependent atelectasis. Heart size is normal. No pericardial effusion. Hepatobiliary: Gallbladder is moderately distended with multiple hyperdense gallstones. Gallbladder wall appears diffusely thickened with pericholecystic inflammatory fat stranding and small volume of adjacent fluid. Unenhanced liver is within normal limits. Small volume perihepatic ascites. Pancreas: Unremarkable. No pancreatic ductal dilatation or surrounding inflammatory changes. Spleen: Unremarkable. Adrenals/Urinary Tract: Slightly nodular configuration of the left adrenal gland is unchanged. Unremarkable right adrenal gland. Bilateral kidneys are within normal limits. No renal stone or hydronephrosis.  Urinary bladder is unremarkable. Stomach/Bowel: Postsurgical changes again noted at the stomach. No evidence of bowel wall thickening, distention, or inflammatory changes. Vascular/Lymphatic: Scattered aortoiliac atherosclerotic calcifications without aneurysm. No abdominopelvic lymphadenopathy. Reproductive: Uterus and bilateral adnexa are unremarkable. Other: No organized abdominopelvic fluid collection. No pneumoperitoneum. No abdominal wall hernia. Musculoskeletal: No new or acute bony findings. IMPRESSION: 1. Appearance of the gallbladder remains suspicious for acute cholecystitis. 2. Small volume perihepatic ascites. Aortic Atherosclerosis (ICD10-I70.0). Electronically Signed   By: Davina Poke D.O.   On: 01/15/2021 15:10   DG Chest 2 View  Result Date: 01/15/2021 CLINICAL DATA:  Pt arrived via [facility] with c/o upper abd pain, started a juice cleanse last Wednesday, states it was apple juice, Pt c/o N/V and abd pain. 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 Cardiac silhouette is normal in size. No mediastinal or hilar masses or evidence of adenopathy. Dependent opacity at both lung bases consistent with atelectasis. Remainder of the lungs is clear. No convincing pleural effusion and no pneumothorax. Skeletal structures are intact. IMPRESSION: No acute cardiopulmonary disease. Electronically Signed   By: Lajean Manes M.D.   On: 01/15/2021 13:37   US ABDOMEN LIMITED RUQ (LIVER/GB)  Result Date: 01/15/2021 CLINICAL DATA:  Right upper quadrant pain since this morning. History of a gallbladder drainage in June 2022. Drain removed in August 2022. EXAM: ULTRASOUND ABDOMEN LIMITED RIGHT UPPER QUADRANT COMPARISON:  Ultrasound, 09/26/2020. FINDINGS: Gallbladder: Gallbladder is  distended. There are dependent stones and sludge. Wall  thickened to between 4 and 8 mm. Small amount of pericholecystic fluid. Common bile duct: Diameter: 6 mm Liver: No focal lesion identified. Within normal limits in parenchymal echogenicity. Portal vein is patent on color Doppler imaging with normal direction of blood flow towards the liver. Other: Trace perihepatic fluid. IMPRESSION: 1. Distended gallbladder with wall thickening and dependent stones and sludge as well as a small amount of pericholecystic fluid. Findings are similar to the prior ultrasound and support acute cholecystitis in the proper clinical setting. Electronically Signed   By: Lajean Manes M.D.   On: 01/15/2021 14:13     EKG: I have personally reviewed.  Sinus rhythm, QTC 470, no ischemic change  Assessment/Plan Principal Problem:   Acute cholecystitis Active Problems:   Acquired hypothyroidism   Essential hypertension   Type 2 diabetes mellitus without complication (HCC)   COPD (chronic obstructive pulmonary disease) (HCC)   DVT (deep venous thrombosis) (HCC)   OSA on CPAP   Chronic diastolic CHF (congestive heart failure) ([place])   Sepsis ([place])   Sepsis due to acute cholecystitis: this is a recurrent issue.  Patient meets criteria for sepsis with leukocytosis with WBC 14.2, tachycardia with heart rate in 95.  No fever.  Currently hemodynamically stable.  Pending lactic acid level.  Dr. Celine Ahr of general surgeon is consulted, thinks patient is a poor candidate for surgery.  She recommend IR consult for "percutaneous cholecystostomy tube placement and reevaluation of the patient's candidacy for Spyglass percutaneous stone extraction".  Message sent to Dr. Pascal Lux for IR for consultation.  -Admitted to [facility] bed as inpatient -N.p.o. -As needed Zofran for nausea and vomiting and fentanyl as needed for pain -Switch Xarelto to IV heparin -Check INR/PTT -will get Procalcitonin and trend lactic acid levels per sepsis protocol. -IVF: 50 cc/h (patient has congestive heart  failure, limiting aggressive IV fluids treatment).  Acquired hypothyroidism -Synthroid  Essential hypertension -IV hydralazine as needed -Metoprolol  Type 2 diabetes mellitus without complication [facility]): Recent A1c 6.1, well controlled.  Patient is taking Lantus 20-10 unit twice daily and Novolin at home -Sliding scale insulin -Glargine insulin 10-5 unit twice daily  COPD (chronic obstructive pulmonary disease) ([place]): Stable -Bronchodilators  DVT (deep venous thrombosis) ([place]): -Switch Xarelto to IV heparin  OSA - on CPAP  Chronic diastolic CHF (congestive heart failure) ([place]): 2D echo on 6/60/22 showed EF of 55-60%.  Patient has trace leg edema, no pulm edema chest x-ray.  CHF seem to be compensated. -Hold Bumex while patient is on n.p.o. -Check BNP  Right shoulder pain: Possibly due to right upper quadrant abdominal pain radiating to the shoulder -f/u x-ray of right shoulder -As needed Tylenol    DVT ppx: on IV  Heparin  Code Status: Full code per pt and her daughter Family Communication:   Yes, patient's  daughter at bed side Disposition Plan:  Anticipate discharge back to previous environment Consults called:  Dr. Celine Ahr of surgery and Dr. Pascal Lux of IR, Message sent to Dr. Pascal Lux for IR for consultation. Admission status and Level of care: Med-Surg:     as inpt        Status is: Inpatient  Remains inpatient appropriate because:Inpatient level of care appropriate due to severity of illness  Dispo: The patient is from: Home              Anticipated d/c is to: Home  Patient currently is not medically stable to d/c.   Difficult to place patient No          Date of Service 01/15/2021    Ivor Costa [facility]   If 7PM-7AM, please contact night-coverage [email or web address] 01/15/2021, 4:20 PM

## 2021-01-15 NOTE — ED Provider Notes (Signed)
HPI: Pt is a 80 y.o. female who presents with complaints of RUQ pain   The patient p/w  RUQ pain/shoulder pain   ROS: Denies fever, chest pain, vomiting  Past Medical History:  Diagnosis Date   Asthma    Asthmatic bronchitis , chronic (HCC)    CHF (congestive heart failure) ([place])    Diabetes mellitus without complication ([place])    DVT (deep venous thrombosis) (HCC)    x3 - both legs. last one approx 2013   Family history of adverse reaction to anesthesia    Mother - temporary paralysis of 1 side   GERD (gastroesophageal reflux disease)    Hypertension    Neuropathy    feet   PONV (postoperative nausea and vomiting)    Pt reports violent vomiting with ANY pain meds given with anesthesia.   Sleep apnea    CPAP   Thyroid disease    Vertigo    daily   There were no vitals filed for this visit.  Focused Physical Exam: Gen: No acute distress Head: atraumatic, normocephalic Eyes: Extraocular movements grossly intact; conjunctiva clear CV: RRR Lung: No increased WOB, no stridor GI: ND, no obvious masses, tender RUQ Neuro: Alert and awake  Medical Decision Making and Plan: Given the patient's initial medical screening exam, the following diagnostic evaluation has been ordered. The patient will be placed in the appropriate treatment space, once one is available, to complete the evaluation and treatment. I have discussed the plan of care with the patient and I have advised the patient that an ED physician or mid-level practitioner will reevaluate their condition after the test results have been received, as the results may give them additional insight into the type of treatment they may need.   Diagnostics: US/Labs.xray/ekg trop   Treatments: fent    Vanessa [place], MD 01/15/21 1254

## 2021-01-15 NOTE — ED Triage Notes (Signed)
FIRST NURSE NOTE:  Pt arrived via [facility] with c/o upper abd pain, started a juice cleanse last Wednesday, states it was apple juice.  Pt c/o N/V and abd pain.  Last BM yesterday was loose  188/91 EKG - 4 zofran IV, 20G R ac

## 2021-01-15 NOTE — ED Provider Notes (Signed)
[facility] Emergency Department Provider Note  ____________________________________________   Event Date/Time   First MD Initiated Contact with Patient 01/15/21 1301     (approximate)  I have reviewed the triage vital signs and the nursing notes.   HISTORY  Chief Complaint Abdominal Pain    HPI Gina Chambers is a 80 y.o. female with chronic diastolic heart failure, hypertension, diabetes, COPD, hypothyroidism, COVID infection back in May who comes in with concerns for right upper quadrant pain.  Patient reports having severe pain in the right upper quadrant, started this morning, constant, nothing makes it better, nothing makes it worse.  Patient reports pain Also in her R shoulder.  Has had some associated nausea and vomiting patient was given 4 mg of Zofran with EMS.  On review of records patient reports having a history of acute cholecystitis and she had a [place] ostomy tube that was placed to remain in place for 6 to 8 weeks patient was treated with IV Zosyn in the hospital and [place] on 7 days of p.o. Augmentin.  This was back in June 2022            Past Medical History:  Diagnosis Date   Asthma    Asthmatic bronchitis , chronic (HCC)    CHF (congestive heart failure) (HCC)    Diabetes mellitus without complication ([place])    DVT (deep venous thrombosis) (HCC)    x3 - both legs. last one approx 2013   Family history of adverse reaction to anesthesia    Mother - temporary paralysis of 1 side   GERD (gastroesophageal reflux disease)    Hypertension    Neuropathy    feet   PONV (postoperative nausea and vomiting)    Pt reports violent vomiting with ANY pain meds given with anesthesia.   Sleep apnea    CPAP   Thyroid disease    Vertigo    daily    Patient Active Problem List   Diagnosis Date Noted   Inguinal abscess    Anticoagulated    COVID-19 virus infection 09/24/2020   Type 2 diabetes mellitus without complication ([place]) 86/75/4492    Hyponatremia 09/24/2020   Cholecystitis 09/23/2020   Chest pain 12/24/2018   Hypoglycemia 11/07/2017   GIB (gastrointestinal bleeding) 11/07/2016   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) ([place]) 06/19/2016   COPD exacerbation ([place]) 05/12/2016   Community acquired pneumonia 05/12/2016   Leukocytosis 05/12/2016   Generalized weakness 05/12/2016   Acquired hypothyroidism 12/15/2015   Essential hypertension 12/15/2015    Past Surgical History:  Procedure Laterality Date   CARDIAC SURGERY     CATARACT EXTRACTION W/PHACO Right 03/11/2017   Procedure: CATARACT EXTRACTION PHACO AND INTRAOCULAR LENS PLACEMENT (IOC)  RIGHT DIABETIC;  Surgeon: Eulogio Bear, MD;  Location: [place];  Service: Ophthalmology;  Laterality: Right;  Diabetic - insulin sleep apnea   CATARACT EXTRACTION W/PHACO Left 04/02/2017   Procedure: CATARACT EXTRACTION PHACO AND INTRAOCULAR LENS PLACEMENT ([place]) LEFT DIABETES;  Surgeon: Eulogio Bear, MD;  Location: [place];  Service: Ophthalmology;  Laterality: Left;  Diabetic - insulin   COLONOSCOPY WITH PROPOFOL N/A 11/09/2016   Procedure: COLONOSCOPY WITH PROPOFOL;  Surgeon: Jonathon Bellows, MD;  Location: [facility] ENDOSCOPY;  Service: Gastroenterology;  Laterality: N/A;   HERNIA REPAIR     IR CHOLANGIOGRAM EXISTING TUBE  12/06/2020   IR RADIOLOGIST EVAL & MGMT  11/23/2020   VERTICAL BANDED GASTROPLASTY      Prior to  Admission medications   Medication Sig Start Date End Date Taking? Authorizing Provider  aspirin EC 81 MG tablet Take 81 mg by mouth at bedtime.    [provider]  bumetanide (BUMEX) 0.5 MG tablet Take 0.5-1 mg by mouth See admin instructions. Take 2 tablets (1mg ) in the morning and 1 tablet (0.5 mg) in the evening    [provider]  Chromium Picolinate 200 MCG CAPS Take 200 mcg by mouth daily.    [provider]  diphenhydrAMINE (BENADRYL) 25 mg capsule Take 25 mg by mouth every 6  (six) hours as needed. 04/27/19   [provider]  fluticasone (FLOVENT HFA) 110 MCG/ACT inhaler Inhale 2 puffs into the lungs 2 (two) times daily.     [provider]  gabapentin (NEURONTIN) 100 MG capsule Take 200 mg by mouth at bedtime. 02/18/20 02/17/21  [provider]  insulin glargine (LANTUS) 100 UNIT/ML injection Inject 20 Units into the skin See admin instructions. Takes 20 units in the morning,Takes lantus at night based on sliding scale    [provider]  insulin regular (NOVOLIN R,HUMULIN R) 250 units/2.74mL (100 units/mL) injection Inject 10 Units into the skin 4 (four) times daily. Only uses Sliding scale at night    [provider]  Ipratropium-Albuterol (COMBIVENT) 20-100 MCG/ACT AERS respimat Inhale 1 puff into the lungs 4 (four) times daily as needed for wheezing or shortness of breath.    [provider]  lansoprazole (PREVACID) 30 MG capsule Take 30 mg by mouth 2 (two) times daily.    [provider]  levalbuterol Penne Lash) 0.63 MG/3ML nebulizer solution Inhale 0.63 mg into the lungs every 6 (six) hours as needed. 04/27/19   [provider]  levothyroxine (SYNTHROID, LEVOTHROID) 125 MCG tablet Take 125 mcg by mouth daily.    [provider]  Magnesium 500 MG TABS Take 500 mg by mouth 4 (four) times daily.     [provider]  metoprolol succinate (TOPROL-XL) 50 MG 24 hr tablet Take 50 mg by mouth daily. Take with or immediately following a meal.    [provider]  montelukast (SINGULAIR) 10 MG tablet Take 10 mg by mouth at bedtime.    [provider]  Multiple Vitamins-Minerals (CENTRUM SILVER PO) Take by mouth.    [provider]  Multiple Vitamins-Minerals (PRESERVISION AREDS 2 PO) Take 1 tablet by mouth 2 (two) times daily.    [provider]  MYRBETRIQ 25 MG TB24 tablet Take 25 mg by mouth daily. 12/21/19   [provider]  UBIQUINOL PO Use 300 mg     [provider]  VITAMIN E PO Take by mouth.    [provider]  XARELTO 20 MG TABS tablet Take 20 mg by mouth daily. 02/20/20   [provider]  zinc gluconate 1.5 mg/mL SOLN Take by mouth.    [provider]    Allergies Ropinirole, Azelastine, Bupropion, Calcium, Carbidopa-levodopa, Cefuroxime, Cephalexin, Clinoril [sulindac], Codeine, Duloxetine, Duloxetine hcl, Ezetimibe, Fenofibrate, Furosemide, Glipizide, Lorazepam, Lortab [hydrocodone-acetaminophen], Metaproterenol, Morphine and related, Nalbuphine, Naproxen, Norfloxacin, Rofecoxib, Sodium, Statins, Sulfa antibiotics, Sulfasalazine, Tramadol, Trazodone and nefazodone, Doxycycline, Iodine, Ketoprofen, Piroxicam, and Tolmetin  Family History  Problem Relation Age of Onset   Breast cancer Mother        63's   Breast cancer Maternal Grandmother    Breast cancer Paternal Grandmother     Social History Social History   Tobacco Use   Smoking status: Never   Smokeless  tobacco: Never  Vaping Use   Vaping Use: Never used  Substance Use Topics   Alcohol use: No   Drug use: No      Review of Systems Constitutional: No fever/chills Eyes: No visual changes. ENT: No sore throat. Cardiovascular: Denies chest pain. Respiratory: Denies shortness of breath. Gastrointestinal: Abdominal pain, nausea, vomiting Genitourinary: Negative for dysuria. Musculoskeletal: Negative for back pain.  Right shoulder pain Skin: Negative for rash. Neurological: Negative for headaches, focal weakness or numbness. All other ROS negative ____________________________________________   PHYSICAL EXAM:  VITAL SIGNS: ED Triage Vitals  Enc Vitals Group     BP 01/15/21 1255 (!) 169/67     Pulse Rate 01/15/21 1255 95     Resp 01/15/21 1255 20     Temp 01/15/21 1255 97.7 F (36.5 C)     Temp Source 01/15/21 1255 Oral     SpO2 01/15/21 1255 97 %     Weight 01/15/21 1250 176 lb (79.8 kg)     Height 01/15/21 1250  5\' 3"  (1.6 m)     Head Circumference --      Peak Flow --      Pain Score 01/15/21 1250 10     Pain Loc --      Pain Edu? --      Excl. in [place]? --     Constitutional: Alert and oriented.  Appears uncomfortable Eyes: Conjunctivae are normal. EOMI. Head: Atraumatic. Nose: No congestion/rhinnorhea. Mouth/Throat: Mucous membranes are moist.   Neck: No stridor. Trachea Midline. FROM Cardiovascular: Normal rate, regular rhythm. Grossly normal heart sounds.  Good peripheral circulation. Respiratory: Normal respiratory effort.  No retractions. Lungs CTAB. Gastrointestinal: Tender in the right upper quadrant no distention. No abdominal bruits.  Musculoskeletal: No lower extremity tenderness nor edema.  No joint effusions. Neurologic:  Normal speech and language. No gross focal neurologic deficits are appreciated.  Skin:  Skin is warm, dry and intact. No rash noted. Psychiatric: Mood and affect are normal. Speech and behavior are normal. GU: Deferred   ____________________________________________   LABS (all labs ordered are listed, but only abnormal results are displayed)  Labs Reviewed  COMPREHENSIVE METABOLIC PANEL - Abnormal; Notable for the following components:      Result Value   Sodium 132 (*)    Glucose, Bld 210 (*)    All other components within normal limits  CBC - Abnormal; Notable for the following components:   WBC 14.2 (*)    Platelets 128 (*)    All other components within normal limits  RESP PANEL BY RT-PCR (FLU A&B, COVID) ARPGX2  LIPASE, BLOOD  URINALYSIS, COMPLETE (UACMP) WITH MICROSCOPIC  TROPONIN I (HIGH SENSITIVITY)   ____________________________________________   ED ECG REPORT I, Vanessa Teller, the attending physician, personally viewed and interpreted this ECG.  Normal sinus rhythm 94, no ST elevation, no T wave inversions, normal intervals ____________________________________________  RADIOLOGY Robert Bellow, personally viewed and evaluated these  images (plain radiographs) as part of my medical decision making, as well as reviewing the written report by the radiologist.  ED MD interpretation:  no pna   Official radiology report(s): DG Chest 2 View  Result Date: 01/15/2021 CLINICAL DATA:  Pt arrived via [facility] with c/o upper abd pain, started a juice cleanse last Wednesday, states it was apple juice, Pt c/o N/V and abd pain. Last BM yesterday was loose, history of asthma, CHF, diabetes, GERD, hypertension, neuropathy EXAM: CHEST - 2 VIEW COMPARISON:  12/24/2018 FINDINGS: Stable changes from  prior cardiac surgery and valve replacements. Cardiac silhouette is normal in size. No mediastinal or hilar masses or evidence of adenopathy. Dependent opacity at both lung bases consistent with atelectasis. Remainder of the lungs is clear. No convincing pleural effusion and no pneumothorax. Skeletal structures are intact. IMPRESSION: No acute cardiopulmonary disease. Electronically Signed   By: Lajean Manes M.D.   On: 01/15/2021 13:37    ____________________________________________   PROCEDURES  Procedure(s) performed (including Critical Care):  .1-3 Lead EKG Interpretation Performed by: Vanessa [place], MD Authorized by: Vanessa [place], MD     Interpretation: normal     ECG rate:  80s   ECG rate assessment: normal     Rhythm: sinus rhythm     Ectopy: none     Conduction: normal     ____________________________________________   INITIAL IMPRESSION / ASSESSMENT AND PLAN / ED COURSE  Gina Chambers was evaluated in Emergency Department on 01/15/2021 for the symptoms described in the history of present illness. She was evaluated in the context of the global COVID-19 pandemic, which necessitated consideration that the patient might be at risk for infection with the SARS-CoV-2 virus that causes COVID-19. Institutional protocols and algorithms that pertain to the evaluation of patients at risk for COVID-19 are in a state of rapid change based on  information released by regulatory bodies including the CDC and federal and state organizations. These policies and algorithms were followed during the patient's care in the ED.    Patient comes in looking very uncomfortable with right upper quadrant tenderness with a history of cholecystitis status postdrainage.  We will get ultrasound to evaluate for recurrent cholecystitis.  Will get chest x-ray given the shoulder pain and troponin to evaluate for ACS keep patient on the cardiac monitor to evaluate for any arrhythmia.  Labs ordered to evaluate for any evidence of dehydration.  Patient was given some IV Dilaudid to help with pain.   Chest x-ray is negative.  Troponin slightly elevated but around her baseline's previously.  Her sodium is slightly low but similar to where she typically is at.  White count is elevated but down from 3 months ago.  3:46 PM patient ultrasound concerning for cholecystitis but is similar to prior therefore I will make sure is nothing else that we could be missing so we will get CT scan to make sure no appendicitis or other acute pathology.  CT imaging is otherwise negative.  This is concerning for cholecystitis.  Discussed the case with Dr. Celine Ahr from surgery and patient has not.  Recommends admission to medicine for IR consultation.  She stated that last in their doctor about due to another procedure such as spy glass         ____________________________________________   FINAL CLINICAL IMPRESSION(S) / ED DIAGNOSES   Final diagnoses:  RUQ abdominal pain  Cholecystitis      MEDICATIONS GIVEN DURING THIS VISIT:  Medications  piperacillin-tazobactam (ZOSYN) IVPB 3.375 g (has no administration in time range)  fentaNYL (SUBLIMAZE) injection 50 mcg (50 mcg Intravenous Given 01/15/21 1257)  HYDROmorphone (DILAUDID) injection 0.5 mg (0.5 mg Intravenous Given 01/15/21 1435)  ondansetron (ZOFRAN) injection 4 mg (4 mg Intravenous Given 01/15/21 1435)     ED  Discharge Orders     None        Note:  This document was prepared using Dragon voice recognition software and may include unintentional dictation errors.    Vanessa , MD 01/15/21 [phone]

## 2021-01-15 NOTE — Progress Notes (Signed)
Pharmacy Antibiotic Note  Gina Chambers is a 80 y.o. female 80 y.o. female with chronic diastolic heart failure, hypertension, diabetes, COPD, hypothyroidism, COVID infection back in May who came in with concerns for right upper quadrant pain and was admitted on 01/15/2021.  Pharmacy has been consulted for Zosyn dosing for cholecystitis.  Plan: Zosyn 3.375g IV q8h (4 hour infusion). Monitor renal function and adjust dose as clinically indicated  Height: 5\' 3"  (160 cm) Weight: 79.8 kg (176 lb) IBW/kg (Calculated) : 52.4  Temp (24hrs), Avg:97.7 F (36.5 C), Min:97.7 F (36.5 C), Max:97.7 F (36.5 C)  Recent Labs  Lab 01/15/21 1251  WBC 14.2*  CREATININE 0.69    Estimated Creatinine Clearance: 58 mL/min (by C-G formula based on SCr of 0.69 mg/dL).    Allergies  Allergen Reactions   Ropinirole Nausea And Vomiting   Azelastine Other (See Comments)    Closes airways  Closes airways     Bupropion     GI issues    Calcium     Chest pain    Carbidopa-Levodopa Other (See Comments)    Patient reports chest pain, back pain and left side pain   Cefuroxime     Swelling    Cephalexin    Clinoril [Sulindac]     GI   Codeine Hives   Duloxetine     Bleeding    Duloxetine Hcl Other (See Comments)    Bleeding   Ezetimibe     Joint pain    Fenofibrate     Leg cramps   Furosemide     Fluid retention    Glipizide     Bloating    Lorazepam     SI   Lortab [Hydrocodone-Acetaminophen] Hives   Metaproterenol     Palpitations    Morphine And Related    Nalbuphine     Rapid heart rate  flushing    Naproxen     Numbness    Norfloxacin     Urinary retention    Rofecoxib    Sodium    Statins     Muscle pain    Sulfa Antibiotics     Unknown    Sulfasalazine Other (See Comments)    Unknown   Tramadol    Trazodone And Nefazodone    Doxycycline Rash   Iodine Rash   Ketoprofen Rash   Piroxicam Rash   Tolmetin Rash    Antimicrobials this admission: 9/25 Zosyn  >>   Dose adjustments this admission:   Microbiology results: 9/25 BCx: sent  Thank you for allowing pharmacy to be a part of this patient's care.  Forde Dandy Angle Karel 01/15/2021 4:09 PM

## 2021-01-16 ENCOUNTER — Inpatient Hospital Stay: Payer: Medicare Other | Admitting: Radiology

## 2021-01-16 ENCOUNTER — Encounter: Payer: Self-pay | Admitting: Internal Medicine

## 2021-01-16 DIAGNOSIS — K810 Acute cholecystitis: Secondary | ICD-10-CM | POA: Diagnosis not present

## 2021-01-16 HISTORY — PX: IR PERC CHOLECYSTOSTOMY: IMG2326

## 2021-01-16 LAB — HEPATIC FUNCTION PANEL
ALT: 16 U/L (ref 0–44)
AST: 23 U/L (ref 15–41)
Albumin: 2.9 g/dL — ABNORMAL LOW (ref 3.5–5.0)
Alkaline Phosphatase: 70 U/L (ref 38–126)
Bilirubin, Direct: 0.2 mg/dL (ref 0.0–0.2)
Indirect Bilirubin: 1 mg/dL — ABNORMAL HIGH (ref 0.3–0.9)
Total Bilirubin: 1.2 mg/dL (ref 0.3–1.2)
Total Protein: 5.1 g/dL — ABNORMAL LOW (ref 6.5–8.1)

## 2021-01-16 LAB — BASIC METABOLIC PANEL
Anion gap: 5 (ref 5–15)
BUN: 23 mg/dL (ref 8–23)
CO2: 25 mmol/L (ref 22–32)
Calcium: 8.4 mg/dL — ABNORMAL LOW (ref 8.9–10.3)
Chloride: 104 mmol/L (ref 98–111)
Creatinine, Ser: 1.5 mg/dL — ABNORMAL HIGH (ref 0.44–1.00)
GFR, Estimated: 35 mL/min — ABNORMAL LOW (ref >60)
Glucose, Bld: 153 mg/dL — ABNORMAL HIGH (ref 70–99)
Potassium: 4.5 mmol/L (ref 3.5–5.1)
Sodium: 134 mmol/L — ABNORMAL LOW (ref 135–145)

## 2021-01-16 LAB — GLUCOSE, CAPILLARY
Glucose-Capillary: 148 mg/dL — ABNORMAL HIGH (ref 70–99)
Glucose-Capillary: 122 mg/dL — ABNORMAL HIGH (ref 70–99)
Glucose-Capillary: 129 mg/dL — ABNORMAL HIGH (ref 70–99)
Glucose-Capillary: 168 mg/dL — ABNORMAL HIGH (ref 70–99)

## 2021-01-16 LAB — URINALYSIS, COMPLETE (UACMP) WITH MICROSCOPIC
Bilirubin Urine: NEGATIVE
Glucose, UA: 50 mg/dL — AB
Hgb urine dipstick: NEGATIVE
Ketones, ur: 5 mg/dL — AB
Leukocytes,Ua: NEGATIVE
Nitrite: NEGATIVE
Protein, ur: NEGATIVE mg/dL
Specific Gravity, Urine: 1.02 (ref 1.005–1.030)
pH: 5 (ref 5.0–8.0)

## 2021-01-16 LAB — CBC
HCT: 38.7 % (ref 36.0–46.0)
Hemoglobin: 13.1 g/dL (ref 12.0–15.0)
MCH: 29.2 pg (ref 26.0–34.0)
MCHC: 33.9 g/dL (ref 30.0–36.0)
MCV: 86.4 fL (ref 80.0–100.0)
Platelets: 127 10*3/uL — ABNORMAL LOW (ref 150–400)
RBC: 4.48 MIL/uL (ref 3.87–5.11)
RDW: 13.7 % (ref 11.5–15.5)
WBC: 20.9 10*3/uL — ABNORMAL HIGH (ref 4.0–10.5)
nRBC: 0 % (ref 0.0–0.2)

## 2021-01-16 LAB — HEPARIN LEVEL (UNFRACTIONATED)
Heparin Unfractionated: 0.2 IU/mL — ABNORMAL LOW (ref 0.30–0.70)
Heparin Unfractionated: 0.5 IU/mL (ref 0.30–0.70)
Heparin Unfractionated: <0.1 IU/mL — ABNORMAL LOW (ref 0.30–0.70)

## 2021-01-16 LAB — APTT: aPTT: 36 seconds (ref 24–36)

## 2021-01-16 MED ORDER — MIDAZOLAM HCL 2 MG/2ML IJ SOLN
INTRAMUSCULAR | Status: AC
  Filled 2021-01-16: qty 2

## 2021-01-16 MED ORDER — ONDANSETRON HCL 4 MG/2ML IJ SOLN
INTRAMUSCULAR | Status: AC
  Filled 2021-01-16: qty 2

## 2021-01-16 MED ORDER — LIDOCAINE HCL 1 % IJ SOLN
INTRAMUSCULAR | Status: AC
  Administered 2021-01-16: 7 mL
  Filled 2021-01-16: qty 20

## 2021-01-16 MED ORDER — ONDANSETRON HCL 4 MG/2ML IJ SOLN
INTRAMUSCULAR | Status: DC | PRN
  Administered 2021-01-16: 4 mg via INTRAVENOUS
  Administered 2021-01-19: 4 mg

## 2021-01-16 MED ORDER — HEPARIN BOLUS VIA INFUSION
1000.0000 [IU] | Freq: Once | INTRAVENOUS | Status: AC
  Administered 2021-01-16: 1000 [IU] via INTRAVENOUS
  Filled 2021-01-16: qty 1000

## 2021-01-16 MED ORDER — IOHEXOL 350 MG/ML SOLN
8.0000 mL | Freq: Once | INTRAVENOUS | Status: AC | PRN
  Administered 2021-01-16: 8 mL

## 2021-01-16 MED ORDER — FLUTICASONE PROPIONATE HFA 110 MCG/ACT IN AERO
2.0000 | INHALATION_SPRAY | Freq: Two times a day (BID) | RESPIRATORY_TRACT | Status: DC
  Administered 2021-01-16 – 2021-01-31 (×30): 2 via RESPIRATORY_TRACT
  Filled 2021-01-16: qty 12

## 2021-01-16 MED ORDER — SODIUM CHLORIDE 0.9% FLUSH
5.0000 mL | Freq: Three times a day (TID) | INTRAVENOUS | Status: DC
  Administered 2021-01-16 – 2021-01-31 (×41): 5 mL

## 2021-01-16 MED ORDER — FENTANYL CITRATE (PF) 100 MCG/2ML IJ SOLN
INTRAMUSCULAR | Status: AC
  Filled 2021-01-16: qty 2

## 2021-01-16 MED ORDER — HYDROMORPHONE HCL 1 MG/ML IJ SOLN
1.0000 mg | INTRAMUSCULAR | Status: DC | PRN
  Administered 2021-01-16 – 2021-01-20 (×11): 1 mg via INTRAVENOUS
  Filled 2021-01-16 (×12): qty 1

## 2021-01-16 MED ORDER — FENTANYL CITRATE (PF) 100 MCG/2ML IJ SOLN
INTRAMUSCULAR | Status: DC | PRN
  Administered 2021-01-16 (×2): 50 ug via INTRAVENOUS

## 2021-01-16 MED ORDER — MIDAZOLAM HCL 2 MG/2ML IJ SOLN
INTRAMUSCULAR | Status: DC | PRN
  Administered 2021-01-16: 1 mg via INTRAVENOUS

## 2021-01-16 NOTE — OR Nursing (Signed)
Chole tube insertion. 4 mg zofran iv given pre procedure for nausea/dry heaves, moderate sedation 1 mg versed and 100 mcg Fentanyl IV. Pt rested quietly with eyes closed during procedure.

## 2021-01-16 NOTE — Progress Notes (Signed)
PROGRESS NOTE    Gina Chambers  KCL:[id number] DOB: Jan 05, 1941 DOA: 01/15/2021 PCP: Kirk Ruths, MD   Brief Narrative: 80 y.o. female with medical history significant of hypertension, diabetes mellitus, COPD, asthma, GERD, hypothyroidism, OSA on CPAP, DVT on Xarelto, dCHF, vertigo, GI bleeding, COVID-19 infection, cholecystitis, who presents with abdominal pain.   Her abdominal pain started this morning, which is located in the right upper quadrant, constant, sharp, severe, radiating to the right shoulder.  Patient complains of right shoulder pain, particularly on movement.  Associated with nausea and nonbilious nonbloody vomiting for more than 10 times.  No fever or chills.  No diarrhea.  Patient denies chest pain, cough, shortness breath.  No symptoms of UTI.   Of note, pt had hx of cholecystitis in June 2022. She was treated with IV Zosyn in hospital and discharged on Augmentin.  She had a Coley ostomy tube placed, which was supposed to remain in place for 6 to 8 weeks.  The cholecystostomy was subsequently removed at patient's request.  I discussed the case with interventional radiology.  They are very familiar with the patient.  They relayed that they told the patient that if the cholecystostomy tube was discontinued the patient was very high risk for recurrence.  Of note the patient is very reluctant to have cholecystostomy tube replaced.  She desires for surgical intervention.  I explained that per our general surgeon the patient is too high risk for complications with a surgical procedure and this is why we are recommending cholecystostomy.  We may be able to do a percutaneous stone retrieval which apparently is been offered to her in the past.   Assessment & Plan:   Principal Problem:   Acute cholecystitis Active Problems:   Acquired hypothyroidism   Essential hypertension   Type 2 diabetes mellitus without complication (HCC)   COPD (chronic obstructive pulmonary disease)  (HCC)   DVT (deep venous thrombosis) (HCC)   OSA on CPAP   Chronic diastolic CHF (congestive heart failure) (HCC)   Sepsis ([place])   Right shoulder pain  Sepsis due to acute cholecystitis Patient meets criteria for sepsis with leukocytosis with WBC 14.2, tachycardia with heart rate in 95.  Fever T-max 100.8.  Currently hemodynamically stable.     Dr. Celine Ahr of general surgeon is consulted, thinks patient is a poor candidate for surgery.  She recommend IR consult for "percutaneous cholecystostomy tube placement and reevaluation of the patient's candidacy for Spyglass percutaneous stone extraction".  Discussed with interventional radiology  Plan: Keep n.p.o. for now.  As needed pain control.  IV antibiotics.  IV fluids.    We will continue IV heparin until IR can evaluate and discuss risks versus benefits of procedure with the patient.  It is unclear whether IV antibiotics alone will be sufficient to achieve source control.  My suspicion is the patient will likely need percutaneous drainage in order to address septic focus.  Per Lyndel Safe perioperative risk index and RCRI [place] criteria the patient is between 5 to 11% risks of perioperative cardiac complications.      Acquired hypothyroidism -Synthroid   Essential hypertension -IV hydralazine as needed -Metoprolol   Type 2 diabetes mellitus without complication (HCC)  Recent A1c 6.1, well controlled.   Patient is taking Lantus 20-10 unit twice daily and Novolin at home -Sliding scale insulin -Glargine insulin 10-5 unit twice daily   COPD (chronic obstructive pulmonary disease) (HCC)  Stable -Bronchodilators   DVT (deep venous thrombosis) [facility]): Patient was on Xarelto  at home.  She has been off Xarelto for 4 to 5 days as she has been on a "cleanse" to deal with her cholecystitis.  Continue holding Xarelto.  Continue IV heparin until we have a plan for procedure from interventional radiology   OSA - on CPAP   Chronic diastolic CHF  (congestive heart failure) ([place])  2D echo on 6/60/22 showed EF of 55-60%.  Patient has trace leg edema, no pulm edema chest x-ray.  CHF seem to be compensated. -Bumex on hold for now   Right shoulder pain Possibly due to right upper quadrant abdominal pain radiating to the shoulder -X-ray negative for fracture -As needed Tylenol   DVT prophylaxis: IV heparin Code Status: Full Family Communication: None today Disposition Plan: Status is: Inpatient  Remains inpatient appropriate because:Inpatient level of care appropriate due to severity of illness  Dispo: The patient is from: Home              Anticipated d/c is to: Home              Patient currently is not medically stable to d/c.   Difficult to place patient No       Level of care: Med-Surg  Consultants:  IR General surgery  Procedures:  None  Antimicrobials:  Zosyn   Subjective: Seen and examined.  Reports improvement in her right upper quadrant pain since admission.  Still very reluctant to agree to percutaneous cholecystostomy.  Objective: Vitals:   01/16/21 0437 01/16/21 0452 01/16/21 0545 01/16/21 0727  BP: (!) 104/48  (!) 100/50 (!) 96/45  Pulse: 95   80  Resp: 18     Temp: (!) 100.8 F (38.2 C)  99.8 F (37.7 C) 98.2 F (36.8 C)  TempSrc: Oral  Oral Oral  SpO2: 94%   96%  Weight:  80 kg    Height:        Intake/Output Summary (Last 24 hours) at 01/16/2021 1032 Last data filed at 01/16/2021 0200 Gross per 24 hour  Intake 482.18 ml  Output 250 ml  Net 232.18 ml   Filed Weights   01/15/21 1250 01/16/21 0452  Weight: 79.8 kg 80 kg    Examination:  General exam: Appears calm and comfortable  Respiratory system: Clear to auscultation. Respiratory effort normal. Cardiovascular system: S1-S2, RRR, no murmurs, no pedal edema Gastrointestinal system: Obese, nondistended, tender to palpation RUQ, normal bowel sounds Central nervous system: Alert and oriented. No focal neurological  deficits. Extremities: Symmetric 5 x 5 power. Skin: No rashes, lesions or ulcers Psychiatry: Judgement and insight appear normal. Mood & affect appropriate.     Data Reviewed: I have personally reviewed following labs and imaging studies  CBC: Recent Labs  Lab 01/15/21 1251 01/16/21 0506  WBC 14.2* 20.9*  HGB 13.1 13.1  HCT 38.6 38.7  MCV 87.1 86.4  PLT 128* 258*   Basic Metabolic Panel: Recent Labs  Lab 01/15/21 1251 01/16/21 0506  NA 132* 134*  K 4.2 4.5  CL 101 104  CO2 23 25  GLUCOSE 210* 153*  BUN 16 23  CREATININE 0.69 1.50*  CALCIUM 9.7 8.4*   GFR: Estimated Creatinine Clearance: 30.9 mL/min (A) (by C-G formula based on SCr of 1.5 mg/dL (H)). Liver Function Tests: Recent Labs  Lab 01/15/21 1251 01/16/21 0506  AST 31 23  ALT 23 16  ALKPHOS 99 70  BILITOT 1.2 1.2  PROT 6.8 5.1*  ALBUMIN 3.9 2.9*   Recent Labs  Lab 01/15/21 1251  LIPASE  28   No results for input(s): AMMONIA in the last 168 hours. Coagulation Profile: Recent Labs  Lab 01/15/21 1907  INR 1.1   Cardiac Enzymes: No results for input(s): CKTOTAL, CKMB, CKMBINDEX, TROPONINI in the last 168 hours. BNP (last 3 results) No results for input(s): PROBNP in the last 8760 hours. HbA1C: No results for input(s): HGBA1C in the last 72 hours. CBG: Recent Labs  Lab 01/15/21 1715 01/15/21 2226 01/16/21 0727  GLUCAP 195* 192* 148*   Lipid Profile: No results for input(s): CHOL, HDL, LDLCALC, TRIG, CHOLHDL, LDLDIRECT in the last 72 hours. Thyroid Function Tests: No results for input(s): TSH, T4TOTAL, FREET4, T3FREE, THYROIDAB in the last 72 hours. Anemia Panel: No results for input(s): VITAMINB12, FOLATE, FERRITIN, TIBC, IRON, RETICCTPCT in the last 72 hours. Sepsis Labs: Recent Labs  Lab 01/15/21 1907 01/15/21 2131  PROCALCITON 0.18  --   LATICACIDVEN 2.3* 1.3    Recent Results (from the past 240 hour(s))  Resp Panel by RT-PCR (Flu A&B, Covid) Nasopharyngeal Swab     Status:  None   Collection Time: 01/15/21  2:41 PM   Specimen: Nasopharyngeal Swab; Nasopharyngeal(NP) swabs in vial transport medium  Result Value Ref Range Status   SARS Coronavirus 2 by RT PCR NEGATIVE NEGATIVE Final    Comment: (NOTE) SARS-CoV-2 target nucleic acids are NOT DETECTED.  The SARS-CoV-2 RNA is generally detectable in upper respiratory specimens during the acute phase of infection. The lowest concentration of SARS-CoV-2 viral copies this assay can detect is 138 copies/mL. A negative result does not preclude SARS-Cov-2 infection and should not be used as the sole basis for treatment or other patient management decisions. A negative result may occur with  improper specimen collection/handling, submission of specimen other than nasopharyngeal swab, presence of viral mutation(s) within the areas targeted by this assay, and inadequate number of viral copies(<138 copies/mL). A negative result must be combined with clinical observations, patient history, and epidemiological information. The expected result is Negative.  Fact Sheet for Patients:  EntrepreneurPulse.com.au  Fact Sheet for Healthcare Providers:  IncredibleEmployment.be  This test is no t yet approved or cleared by the [place] FDA and  has been authorized for detection and/or diagnosis of SARS-CoV-2 by FDA under an Emergency Use Authorization (EUA). This EUA will remain  in effect (meaning this test can be used) for the duration of the COVID-19 declaration under Section 564(b)(1) of the Act, 21 U.S.C.section 360bbb-3(b)(1), unless the authorization is terminated  or revoked sooner.       Influenza A by PCR NEGATIVE NEGATIVE Final   Influenza B by PCR NEGATIVE NEGATIVE Final    Comment: (NOTE) The Xpert Xpress SARS-CoV-2/FLU/RSV plus assay is intended as an aid in the diagnosis of influenza from Nasopharyngeal swab specimens and should not be used as a sole basis for  treatment. Nasal washings and aspirates are unacceptable for Xpert Xpress SARS-CoV-2/FLU/RSV testing.  Fact Sheet for Patients: EntrepreneurPulse.com.au  Fact Sheet for Healthcare Providers: IncredibleEmployment.be  This test is not yet approved or cleared by the [place] FDA and has been authorized for detection and/or diagnosis of SARS-CoV-2 by FDA under an Emergency Use Authorization (EUA). This EUA will remain in effect (meaning this test can be used) for the duration of the COVID-19 declaration under Section 564(b)(1) of the Act, 21 U.S.C. section 360bbb-3(b)(1), unless the authorization is terminated or revoked.  Performed at [facility], [address], [place], [place] [address]   Culture, blood (x 2)  Status: None (Preliminary result)   Collection Time: 01/15/21  7:08 PM   Specimen: BLOOD  Result Value Ref Range Status   Specimen Description BLOOD BLOOD LEFT HAND  Final   Special Requests   Final    BOTTLES DRAWN AEROBIC ONLY Blood Culture adequate volume   Culture   Final    NO GROWTH < 12 HOURS Performed at [facility], [address] [place], [place] [address]    Report Status PENDING  Incomplete  Culture, blood (x 2)     Status: None (Preliminary result)   Collection Time: 01/15/21  8:05 PM   Specimen: BLOOD  Result Value Ref Range Status   Specimen Description BLOOD BLOOD LEFT HAND  Final   Special Requests   Final    BOTTLES DRAWN AEROBIC ONLY Blood Culture adequate volume   Culture   Final    NO GROWTH < 12 HOURS Performed at [facility], [address], [place], [place] [address]    Report Status PENDING  Incomplete         Radiology Studies: CT ABDOMEN PELVIS WO CONTRAST  Result Date: 01/15/2021 CLINICAL DATA:  Abdominal distension and right upper quadrant abdominal pain. History of cholecystostomy tube removal on 12/06/2020 EXAM: CT ABDOMEN AND PELVIS WITHOUT CONTRAST  TECHNIQUE: Multidetector CT imaging of the abdomen and pelvis was performed following the standard protocol without IV contrast. COMPARISON:  CT 09/23/2020, ultrasound 01/15/2021 FINDINGS: Lower chest: Bibasilar dependent atelectasis. Heart size is normal. No pericardial effusion. Hepatobiliary: Gallbladder is moderately distended with multiple hyperdense gallstones. Gallbladder wall appears diffusely thickened with pericholecystic inflammatory fat stranding and small volume of adjacent fluid. Unenhanced liver is within normal limits. Small volume perihepatic ascites. Pancreas: Unremarkable. No pancreatic ductal dilatation or surrounding inflammatory changes. Spleen: Unremarkable. Adrenals/Urinary Tract: Slightly nodular configuration of the left adrenal gland is unchanged. Unremarkable right adrenal gland. Bilateral kidneys are within normal limits. No renal stone or hydronephrosis. Urinary bladder is unremarkable. Stomach/Bowel: Postsurgical changes again noted at the stomach. No evidence of bowel wall thickening, distention, or inflammatory changes. Vascular/Lymphatic: Scattered aortoiliac atherosclerotic calcifications without aneurysm. No abdominopelvic lymphadenopathy. Reproductive: Uterus and bilateral adnexa are unremarkable. Other: No organized abdominopelvic fluid collection. No pneumoperitoneum. No abdominal wall hernia. Musculoskeletal: No new or acute bony findings. IMPRESSION: 1. Appearance of the gallbladder remains suspicious for acute cholecystitis. 2. Small volume perihepatic ascites. Aortic Atherosclerosis (ICD10-I70.0). Electronically Signed   By: Davina Poke D.O.   On: 01/15/2021 15:10   DG Chest 2 View  Result Date: 01/15/2021 CLINICAL DATA:  Pt arrived via [facility] with c/o upper abd pain, started a juice cleanse last Wednesday, states it was apple juice, Pt c/o N/V and abd pain. 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 Cardiac silhouette is normal in size. No mediastinal or hilar masses or evidence of adenopathy. Dependent opacity at both lung bases consistent with atelectasis. Remainder of the lungs is clear. No convincing pleural effusion and no pneumothorax. Skeletal structures are intact. IMPRESSION: No acute cardiopulmonary disease. Electronically Signed   By: Lajean Manes M.D.   On: 01/15/2021 13:37   DG Shoulder Right  Result Date: 01/15/2021 CLINICAL DATA:  Right shoulder pain EXAM: RIGHT SHOULDER - 2+ VIEW COMPARISON:  None. FINDINGS: No fracture or malalignment. Moderate AC joint degenerative change with mild glenohumeral degenerative change. Small amount of calcific tendinopathy. IMPRESSION:  1. Degenerative changes without acute osseous abnormality. 2. Calcific tendinopathy Electronically Signed   By: Donavan Foil M.D.   On: 01/15/2021 18:08   US ABDOMEN LIMITED RUQ (LIVER/GB)  Result Date: 01/15/2021 CLINICAL DATA:  Right upper quadrant pain since this morning. History of a gallbladder drainage in June 2022. Drain removed in August 2022. EXAM: ULTRASOUND ABDOMEN LIMITED RIGHT UPPER QUADRANT COMPARISON:  Ultrasound, 09/26/2020. FINDINGS: Gallbladder: Gallbladder is distended. There are dependent stones and sludge. Wall thickened to between 4 and 8 mm. Small amount of pericholecystic fluid. Common bile duct: Diameter: 6 mm Liver: No focal lesion identified. Within normal limits in parenchymal echogenicity. Portal vein is patent on color Doppler imaging with normal direction of blood flow towards the liver. Other: Trace perihepatic fluid. IMPRESSION: 1. Distended gallbladder with wall thickening and dependent stones and sludge as well as a small amount of pericholecystic fluid. Findings are similar to the prior ultrasound and support acute cholecystitis in the proper clinical setting. Electronically Signed    By: Lajean Manes M.D.   On: 01/15/2021 14:13        Scheduled Meds:  aspirin EC  81 mg Oral QHS   fluticasone  2 puff Inhalation BID   gabapentin  100 mg Oral QID   insulin aspart  0-5 Units Subcutaneous QHS   insulin aspart  0-9 Units Subcutaneous TID WC   insulin glargine-yfgn  10 Units Subcutaneous Daily   And   insulin glargine-yfgn  5 Units Subcutaneous QHS   levothyroxine  125 mcg Oral Q0600   metoprolol succinate  50 mg Oral Daily   montelukast  10 mg Oral QHS   multivitamin with minerals   Oral Daily   pantoprazole  20 mg Oral Daily   Continuous Infusions:  sodium chloride 100 mL/hr at 01/16/21 0730   heparin 1,250 Units/hr (01/16/21 0731)   piperacillin-tazobactam (ZOSYN)  IV 3.375 g (01/16/21 0535)     LOS: 1 day    Time spent: 35 minutes    Sidney Ace, MD [facility] Pager [phone]  If 7PM-7AM, please contact night-coverage 01/16/2021, 10:32 AM

## 2021-01-16 NOTE — Procedures (Signed)
Interventional Radiology Procedure Note  Procedure: Percutaneous cholecystostomy tube placement  Complications: None  Estimated Blood Loss: None  Findings: 10 Fr drain placed in GB lumen. Return of dark bile w/ debris. Sample sent for culture. Drain attached to gravity bag drainage.  Gina Chambers. Kathlene Cote, M.D Pager:  [phone]

## 2021-01-16 NOTE — Consult Note (Signed)
ANTICOAGULATION CONSULT NOTE   Pharmacy Consult for heparin infusion Indication: DVT  Allergies  Allergen Reactions   Ropinirole Nausea And Vomiting   Azelastine Other (See Comments)    Closes airways  Closes airways     Bupropion     GI issues    Calcium     Chest pain    Carbidopa-Levodopa Other (See Comments)    Patient reports chest pain, back pain and left side pain   Cefuroxime     Swelling    Cephalexin    Clinoril [Sulindac]     GI   Codeine Hives   Duloxetine     Bleeding    Duloxetine Hcl Other (See Comments)    Bleeding   Ezetimibe     Joint pain    Fenofibrate     Leg cramps   Furosemide     Fluid retention    Glipizide     Bloating    Lorazepam     SI   Lortab [Hydrocodone-Acetaminophen] Hives   Metaproterenol     Palpitations    Morphine And Related    Nalbuphine     Rapid heart rate  flushing    Naproxen     Numbness    Norfloxacin     Urinary retention    Rofecoxib    Sodium    Statins     Muscle pain    Sulfa Antibiotics     Unknown    Sulfasalazine Other (See Comments)    Unknown   Tramadol    Trazodone And Nefazodone    Doxycycline Rash   Iodine Rash   Ketoprofen Rash   Piroxicam Rash   Tolmetin Rash    Patient Measurements: Height: 5\' 3"  (160 cm) Weight: 79.8 kg (176 lb) IBW/kg (Calculated) : 52.4 Heparin Dosing Weight: 69.8 kg   Vital Signs: Temp: 98.8 F (37.1 C) (09/25 2035) Temp Source: Oral (09/25 2035) BP: 134/64 (09/25 2042) Pulse Rate: 110 (09/25 2042)  Labs: Recent Labs    01/15/21 1251 01/15/21 1907 01/16/21 0225  HGB 13.1  --   --   HCT 38.6  --   --   PLT 128*  --   --   APTT  --  136* 36  LABPROT  --  13.7  --   INR  --  1.1  --   HEPARINUNFRC  --   --  0.20*  CREATININE 0.69  --   --   TROPONINIHS 10  --   --      Estimated Creatinine Clearance: 58 mL/min (by C-G formula based on SCr of 0.69 mg/dL).   Medical History: Past Medical History:  Diagnosis Date   Asthma     Asthmatic bronchitis , chronic (HCC)    CHF (congestive heart failure) ([place])    Diabetes mellitus without complication ([place])    DVT (deep venous thrombosis) (HCC)    x3 - both legs. last one approx 2013   Family history of adverse reaction to anesthesia    Mother - temporary paralysis of 1 side   GERD (gastroesophageal reflux disease)    Hypertension    Neuropathy    feet   PONV (postoperative nausea and vomiting)    Pt reports violent vomiting with ANY pain meds given with anesthesia.   Sleep apnea    CPAP   Thyroid disease    Vertigo    daily    Medications:  Rivaroxaban 20 mg daily PTA -- last dose ~ 4 days  ago per patient   Assessment: a 80 y.o. female with PMH of DVT on Xarelto, last dose > 72 hours ago per patient. Admitted with abdominal pain. Surgery consulted. Pharmacy consulted to transition to heparin infusion.  Goal of Therapy:  Heparin level 0.3-0.7 units/ml aPTT 66-102 seconds Monitor platelets by anticoagulation protocol: Yes  9/26 0225 HL 0.20   Plan:  Give 1000 units bolus x 1 Increase heparin infusion to 1250 units/hr Recheck HL in 8 hrs after rate change Continue to monitor H&H and platelets  Renda Rolls, PharmD, [facility] 01/16/2021 3:12 AM

## 2021-01-16 NOTE — Consult Note (Signed)
ANTICOAGULATION CONSULT NOTE   Pharmacy Consult for heparin infusion Indication: DVT  Patient Measurements: Height: 5\' 3"  (160 cm) Weight: 80 kg (176 lb 5.9 oz) IBW/kg (Calculated) : 52.4 Heparin Dosing Weight: 69.8 kg   Vital Signs: Temp: 99.8 F (37.7 C) (09/26 0545) Temp Source: Oral (09/26 0545) BP: 100/50 (09/26 0545) Pulse Rate: 95 (09/26 0437)  Labs: Recent Labs    01/15/21 1251 01/15/21 1907 01/16/21 0225 01/16/21 0506  HGB 13.1  --   --  13.1  HCT 38.6  --   --  38.7  PLT 128*  --   --  127*  APTT  --  136* 36  --   LABPROT  --  13.7  --   --   INR  --  1.1  --   --   HEPARINUNFRC  --   --  0.20*  --   CREATININE 0.69  --   --  1.50*  TROPONINIHS 10  --   --   --      Estimated Creatinine Clearance: 30.9 mL/min (A) (by C-G formula based on SCr of 1.5 mg/dL (H)).   Medical History: Past Medical History:  Diagnosis Date   Asthma    Asthmatic bronchitis , chronic (HCC)    CHF (congestive heart failure) ([place])    Diabetes mellitus without complication ([place])    DVT (deep venous thrombosis) (HCC)    x3 - both legs. last one approx 2013   Family history of adverse reaction to anesthesia    Mother - temporary paralysis of 1 side   GERD (gastroesophageal reflux disease)    Hypertension    Neuropathy    feet   PONV (postoperative nausea and vomiting)    Pt reports violent vomiting with ANY pain meds given with anesthesia.   Sleep apnea    CPAP   Thyroid disease    Vertigo    daily    Medications:  Rivaroxaban 20 mg daily PTA -- last dose ~ 4 days prior to admission  Assessment: a 80 y.o. female with PMH of DVT on Xarelto, last dose > 72 hours ago per patient. Admitted with abdominal pain. Surgery consulted. Pharmacy consulted to transition to heparin infusion. H&H stable & wnl; platelets noted to be low at baseline but stable  Goal of Therapy:  Heparin level 0.3-0.7 units/mL Monitor platelets by anticoagulation protocol: Yes   Plan:  Heparin  level therapeutic: continue heparin infusion at 1250 units/hr Recheck heparin level in 6 hrs to confirm CBC daily while on IV heparin  Vallery Sa, PharmD 01/16/2021 7:09 AM

## 2021-01-16 NOTE — OR Nursing (Signed)
Due to multiple allergies, checked moderate sedation given last time she had Chole tubes placed. Procedure note indicated patient tolerated 2 mg versed and 100 mcg of Fentanyl without adverse reaction.

## 2021-01-16 NOTE — Progress Notes (Signed)
Notified MD of patients blood pressure of 104/48 and manuel of 100/50. Patient reports feeling fine and in less pain this morning. Will continue to monitor.

## 2021-01-16 NOTE — Progress Notes (Signed)
Per patient request, daughter Wells Guiles called and given update on patient's course of treatment

## 2021-01-16 NOTE — Consult Note (Signed)
Reason for Consult: Acute cholecystitis Referring Physician: Marjean Donna, MD (emergency medicine)  Gina Chambers is an 80 y.o. female.  HPI: She is well-known to our service stemming from an episode of cholecystitis in June 2022.  At the time, she had COVID-19 pneumonia on top of her significant medical comorbidities.  A percutaneous cholecystostomy tube was placed and she was treated with IV antibiotics.  She followed up as an outpatient with interventional radiology.  She insisted that her tube be removed and she declined intervention via the Spyglass procedure, stating that she would get rid of the stones on her own via a "cleanse.".  She presented to the emergency department yesterday with right upper quadrant abdominal pain as well as right shoulder pain.  She endorses nonbloody nonbilious emesis, multiple episodes.  She denies any fevers or chills.  On review of her electronic medical record, I do not see any evidence that her underlying comorbidities have improved.  Past Medical History:  Diagnosis Date   Asthma    Asthmatic bronchitis , chronic (HCC)    CHF (congestive heart failure) ([place])    Diabetes mellitus without complication ([place])    DVT (deep venous thrombosis) (HCC)    x3 - both legs. last one approx 2013   Family history of adverse reaction to anesthesia    Mother - temporary paralysis of 1 side   GERD (gastroesophageal reflux disease)    Hypertension    Neuropathy    feet   PONV (postoperative nausea and vomiting)    Pt reports violent vomiting with ANY pain meds given with anesthesia.   Sleep apnea    CPAP   Thyroid disease    Vertigo    daily   Patient Active Problem List   Diagnosis Date Noted   Acute cholecystitis 01/15/2021   Right shoulder pain 01/15/2021   COPD (chronic obstructive pulmonary disease) (HCC)    DVT (deep venous thrombosis) (HCC)    OSA on CPAP    Chronic diastolic CHF (congestive heart failure) ([place])    Sepsis ([place])    Inguinal abscess     Anticoagulated    COVID-19 virus infection 09/24/2020   Type 2 diabetes mellitus without complication ([place]) 17/00/1749   Hyponatremia 09/24/2020   Cholecystitis 09/23/2020   Chest pain 12/24/2018   Hypoglycemia 11/07/2017   GIB (gastrointestinal bleeding) 11/07/2016   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) ([place]) 06/19/2016   COPD exacerbation ([place]) 05/12/2016   Community acquired pneumonia 05/12/2016   Leukocytosis 05/12/2016   Generalized weakness 05/12/2016   Acquired hypothyroidism 12/15/2015   Essential hypertension 12/15/2015    Past Surgical History:  Procedure Laterality Date   CARDIAC SURGERY     CATARACT EXTRACTION W/PHACO Right 03/11/2017   Procedure: CATARACT EXTRACTION PHACO AND INTRAOCULAR LENS PLACEMENT ([place])  RIGHT DIABETIC;  Surgeon: Eulogio Bear, MD;  Location: [facility];  Service: Ophthalmology;  Laterality: Right;  Diabetic - insulin sleep apnea   CATARACT EXTRACTION W/PHACO Left 04/02/2017   Procedure: CATARACT EXTRACTION PHACO AND INTRAOCULAR LENS PLACEMENT ([place]) LEFT DIABETES;  Surgeon: Eulogio Bear, MD;  Location: [facility];  Service: Ophthalmology;  Laterality: Left;  Diabetic - insulin   COLONOSCOPY WITH PROPOFOL N/A 11/09/2016   Procedure: COLONOSCOPY WITH PROPOFOL;  Surgeon: Jonathon Bellows, MD;  [facility] END[facility]COPY;  Service: Gastroenterology;  Laterality: N/A;   HERNIA REPAIR     IR CHOLANGIOGRAM EXISTING TUBE  12/06/2020   IR RADIOLOGIST EVAL & MGMT  11/23/2020  VERTICAL BANDED GASTROPLASTY      Family History  Problem Relation Age of Onset   Breast cancer Mother        40's   Breast cancer Maternal Grandmother    Breast cancer Paternal Grandmother     Social History:  reports that she has never smoked. She has never used smokeless tobacco. She reports that she does not drink alcohol and does not use drugs.  Allergies:  Allergies  Allergen Reactions   Ropinirole Nausea And Vomiting    Azelastine Other (See Comments)    Closes airways  Closes airways     Bupropion     GI issues    Calcium     Chest pain    Carbidopa-Levodopa Other (See Comments)    Patient reports chest pain, back pain and left side pain   Cefuroxime     Swelling    Cephalexin    Clinoril [Sulindac]     GI   Codeine Hives   Duloxetine     Bleeding    Duloxetine Hcl Other (See Comments)    Bleeding   Ezetimibe     Joint pain    Fenofibrate     Leg cramps   Furosemide     Fluid retention    Glipizide     Bloating    Lorazepam     SI   Lortab [Hydrocodone-Acetaminophen] Hives   Metaproterenol     Palpitations    Morphine And Related    Nalbuphine     Rapid heart rate  flushing    Naproxen     Numbness    Norfloxacin     Urinary retention    Rofecoxib    Sodium    Statins     Muscle pain    Sulfa Antibiotics     Unknown    Sulfasalazine Other (See Comments)    Unknown   Tramadol    Trazodone And Nefazodone    Doxycycline Rash   Iodine Rash   Ketoprofen Rash   Piroxicam Rash   Tolmetin Rash    Medications: I have reviewed the patient's current medications.  Results for orders placed or performed during the hospital encounter of 01/15/21 (from the past 48 hour(s))  Lipase, blood     Status: None   Collection Time: 01/15/21 12:51 PM  Result Value Ref Range   Lipase 28 11 - 51 U/L    Comment: Performed at [facility], [address] [place], [place] [address]  Comprehensive metabolic panel     Status: Abnormal   Collection Time: 01/15/21 12:51 PM  Result Value Ref Range   Sodium 132 (L) 135 - 145 mmol/L   Potassium 4.2 3.5 - 5.1 mmol/L   Chloride 101 98 - 111 mmol/L   CO2 23 22 - 32 mmol/L   Glucose, Bld 210 (H) 70 - 99 mg/dL    Comment: Glucose reference range applies only to samples taken after fasting for at least 8 hours.   BUN 16 8 - 23 mg/dL   Creatinine, Ser 0.69 0.44 - 1.00 mg/dL   Calcium 9.7 8.9 - 10.3 mg/dL   Total Protein 6.8 6.5  - 8.1 g/dL   Albumin 3.9 3.5 - 5.0 g/dL   AST 31 15 - 41 U/L   ALT 23 0 - 44 U/L   Alkaline Phosphatase 99 38 - 126 U/L   Total Bilirubin 1.2 0.3 - 1.2 mg/dL   GFR, Estimated >60 >60 mL/min  Comment: (NOTE) Calculated using the CKD-EPI Creatinine Equation (2021)    Anion gap 8 5 - 15    Comment: Performed at [facility], [address] [place], [place] [address]  CBC     Status: Abnormal   Collection Time: 01/15/21 12:51 PM  Result Value Ref Range   WBC 14.2 (H) 4.0 - 10.5 K/uL   RBC 4.43 3.87 - 5.11 MIL/uL   Hemoglobin 13.1 12.0 - 15.0 g/dL   HCT 38.6 36.0 - 46.0 %   MCV 87.1 80.0 - 100.0 fL   MCH 29.6 26.0 - 34.0 pg   MCHC 33.9 30.0 - 36.0 g/dL   RDW 13.3 11.5 - 15.5 %   Platelets 128 (L) 150 - 400 K/uL   nRBC 0.0 0.0 - 0.2 %    Comment: Performed at [facility], [place], [place] [address]  Troponin I (High Sensitivity)     Status: None   Collection Time: 01/15/21 12:51 PM  Result Value Ref Range   Troponin I (High Sensitivity) 10 <18 ng/L    Comment: (NOTE) Elevated high sensitivity troponin I (hsTnI) values and significant  changes across serial measurements may suggest ACS but many other  chronic and acute conditions are known to elevate hsTnI results.  Refer to the "Links" section for chest pain algorithms and additional  guidance. Performed at [facility], [address] [place], [place] [address]   Brain natriuretic peptide     Status: Abnormal   Collection Time: 01/15/21 12:51 PM  Result Value Ref Range   B Natriuretic Peptide 259.3 (H) 0.0 - 100.0 pg/mL    Comment: Performed at [facility], [address] [place], [place] [address]  Resp Panel by RT-PCR (Flu A&B, Covid) Nasopharyngeal Swab     Status: None   Collection Time: 01/15/21  2:41 PM   Specimen: Nasopharyngeal Swab; Nasopharyngeal(NP) swabs in vial transport medium  Result Value Ref Range   SARS Coronavirus 2 by RT PCR NEGATIVE NEGATIVE     Comment: (NOTE) SARS-CoV-2 target nucleic acids are NOT DETECTED.  The SARS-CoV-2 RNA is generally detectable in upper respiratory specimens during the acute phase of infection. The lowest concentration of SARS-CoV-2 viral copies this assay can detect is 138 copies/mL. A negative result does not preclude SARS-Cov-2 infection and should not be used as the sole basis for treatment or other patient management decisions. A negative result may occur with  improper specimen collection/handling, submission of specimen other than nasopharyngeal swab, presence of viral mutation(s) within the areas targeted by this assay, and inadequate number of viral copies(<138 copies/mL). A negative result must be combined with clinical observations, patient history, and epidemiological information. The expected result is Negative.  Fact Sheet for Patients:  EntrepreneurPulse.com.au  Fact Sheet for Healthcare Providers:  IncredibleEmployment.be  This test is no t yet approved or cleared by the [place] FDA and  has been authorized for detection and/or diagnosis of SARS-CoV-2 by FDA under an Emergency Use Authorization (EUA). This EUA will remain  in effect (meaning this test can be used) for the duration of the COVID-19 declaration under Section 564(b)(1) of the Act, 21 U.S.C.section 360bbb-3(b)(1), unless the authorization is terminated  or revoked sooner.       Influenza A by PCR NEGATIVE NEGATIVE   Influenza B by PCR NEGATIVE NEGATIVE    Comment: (NOTE) The Xpert Xpress SARS-CoV-2/FLU/RSV plus assay is intended as an aid in the diagnosis of influenza from Nasopharyngeal swab specimens  and should not be used as a sole basis for treatment. Nasal washings and aspirates are unacceptable for Xpert Xpress SARS-CoV-2/FLU/RSV testing.  Fact Sheet for Patients: EntrepreneurPulse.com.au  Fact Sheet for Healthcare  Providers: IncredibleEmployment.be  This test is not yet approved or cleared by the [place] FDA and has been authorized for detection and/or diagnosis of SARS-CoV-2 by FDA under an Emergency Use Authorization (EUA). This EUA will remain in effect (meaning this test can be used) for the duration of the COVID-19 declaration under Section 564(b)(1) of the Act, 21 U.S.C. section 360bbb-3(b)(1), unless the authorization is terminated or revoked.  Performed at [facility], [address] [place], [place] [address]   Glucose, capillary     Status: Abnormal   Collection Time: 01/15/21  5:15 PM  Result Value Ref Range   Glucose-Capillary 195 (H) 70 - 99 mg/dL    Comment: Glucose reference range applies only to samples taken after fasting for at least 8 hours.  Lactic acid, plasma     Status: Abnormal   Collection Time: 01/15/21  7:07 PM  Result Value Ref Range   Lactic Acid, Venous 2.3 (HH) 0.5 - 1.9 mmol/L    Comment: CRITICAL RESULT CALLED TO, READ BACK BY AND VERIFIED WITH PHOEBE BROWN AT 1956 01/15/2021 DLB Performed at [facility], [address] [place], [place] [address]   Protime-INR     Status: None   Collection Time: 01/15/21  7:07 PM  Result Value Ref Range   Prothrombin Time 13.7 11.4 - 15.2 seconds   INR 1.1 0.8 - 1.2    Comment: (NOTE) INR goal varies based on device and disease states. Performed at [facility], [address] [place], [place] [address]   APTT     Status: Abnormal   Collection Time: 01/15/21  7:07 PM  Result Value Ref Range   aPTT 136 (H) 24 - 36 seconds    Comment:        IF BASELINE aPTT IS ELEVATED, SUGGEST PATIENT RISK ASSESSMENT BE USED TO DETERMINE APPROPRIATE ANTICOAGULANT THERAPY. Performed at [facility], [address] [place], [place] [address]   Procalcitonin     Status: None   Collection Time: 01/15/21  7:07 PM  Result Value Ref Range   Procalcitonin 0.18 ng/mL     Comment:        Interpretation: PCT (Procalcitonin) <= 0.5 ng/mL: Systemic infection (sepsis) is not likely. Local bacterial infection is possible. (NOTE)       Sepsis PCT Algorithm           Lower Respiratory Tract                                      Infection PCT Algorithm    ----------------------------     ----------------------------         PCT < 0.25 ng/mL                PCT < 0.10 ng/mL          Strongly encourage             Strongly discourage   discontinuation of antibiotics    initiation of antibiotics    ----------------------------     -----------------------------       PCT 0.25 - 0.50 ng/mL            PCT 0.10 - 0.25 ng/mL  OR       >80% decrease in PCT            Discourage initiation of                                            antibiotics      Encourage discontinuation           of antibiotics    ----------------------------     -----------------------------         PCT >= 0.50 ng/mL              PCT 0.26 - 0.50 ng/mL               AND        <80% decrease in PCT             Encourage initiation of                                             antibiotics       Encourage continuation           of antibiotics    ----------------------------     -----------------------------        PCT >= 0.50 ng/mL                  PCT > 0.50 ng/mL               AND         increase in PCT                  Strongly encourage                                      initiation of antibiotics    Strongly encourage escalation           of antibiotics                                     -----------------------------                                           PCT <= 0.25 ng/mL                                                 OR                                        > 80% decrease in PCT                                      Discontinue / Do not initiate  antibiotics  Performed at [facility], [address]  [place], [place] [address]   Culture, blood (x 2)     Status: None (Preliminary result)   Collection Time: 01/15/21  7:08 PM   Specimen: BLOOD  Result Value Ref Range   Specimen Description BLOOD BLOOD LEFT HAND    Special Requests      BOTTLES DRAWN AEROBIC ONLY Blood Culture adequate volume   Culture      NO GROWTH < 12 HOURS Performed at [facility], [address] [place], [place] [address]    Report Status PENDING   Culture, blood (x 2)     Status: None (Preliminary result)   Collection Time: 01/15/21  8:05 PM   Specimen: BLOOD  Result Value Ref Range   Specimen Description BLOOD BLOOD LEFT HAND    Special Requests      BOTTLES DRAWN AEROBIC ONLY Blood Culture adequate volume   Culture      NO GROWTH < 12 HOURS Performed at [facility], [address] [place], [place] [address]    Report Status PENDING   Lactic acid, plasma     Status: None   Collection Time: 01/15/21  9:31 PM  Result Value Ref Range   Lactic Acid, Venous 1.3 0.5 - 1.9 mmol/L    Comment: Performed at [facility], [address] [place], [place] [address]  Glucose, capillary     Status: Abnormal   Collection Time: 01/15/21 10:26 PM  Result Value Ref Range   Glucose-Capillary 192 (H) 70 - 99 mg/dL    Comment: Glucose reference range applies only to samples taken after fasting for at least 8 hours.  Heparin level (unfractionated)     Status: Abnormal   Collection Time: 01/16/21  2:25 AM  Result Value Ref Range   Heparin Unfractionated 0.20 (L) 0.30 - 0.70 IU/mL    Comment: (NOTE) The clinical reportable range upper limit is being lowered to >1.10 to align with the FDA approved guidance for the current laboratory assay.  If heparin results are below expected values, and patient dosage has  been confirmed, suggest follow up testing of antithrombin III levels. Performed at [facility], [address] [place], [place] [address]   APTT     Status: None    Collection Time: 01/16/21  2:25 AM  Result Value Ref Range   aPTT 36 24 - 36 seconds    Comment: Performed at [facility] [address] [place], [place] [address]  CBC     Status: Abnormal   Collection Time: 01/16/21  5:06 AM  Result Value Ref Range   WBC 20.9 (H) 4.0 - 10.5 K/uL   RBC 4.48 3.87 - 5.11 MIL/uL   Hemoglobin 13.1 12.0 - 15.0 g/dL   HCT 38.7 36.0 - 46.0 %   MCV 86.4 80.0 - 100.0 fL   MCH 29.2 26.0 - 34.0 pg   MCHC 33.9 30.0 - 36.0 g/dL   RDW 13.7 11.5 - 15.5 %   Platelets 127 (L) 150 - 400 K/uL   nRBC 0.0 0.0 - 0.2 %    Comment: Performed at [facility], [address] [place], [place] [address]  Basic metabolic panel     Status: Abnormal   Collection Time: 01/16/21  5:06 AM  Result Value Ref Range   Sodium 134 (L) 135 - 145 mmol/L   Potassium 4.5 3.5 - 5.1 mmol/L   Chloride 104 98 - 111 mmol/L  CO2 25 22 - 32 mmol/L   Glucose, Bld 153 (H) 70 - 99 mg/dL    Comment: Glucose reference range applies only to samples taken after fasting for at least 8 hours.   BUN 23 8 - 23 mg/dL   Creatinine, Ser 1.50 (H) 0.44 - 1.00 mg/dL   Calcium 8.4 (L) 8.9 - 10.3 mg/dL   GFR, Estimated 35 (L) >60 mL/min    Comment: (NOTE) Calculated using the CKD-EPI Creatinine Equation (2021)    Anion gap 5 5 - 15    Comment: Performed at [facility], [address] [place], [place] [address]  Hepatic function panel     Status: Abnormal   Collection Time: 01/16/21  5:06 AM  Result Value Ref Range   Total Protein 5.1 (L) 6.5 - 8.1 g/dL   Albumin 2.9 (L) 3.5 - 5.0 g/dL   AST 23 15 - 41 U/L   ALT 16 0 - 44 U/L   Alkaline Phosphatase 70 38 - 126 U/L   Total Bilirubin 1.2 0.3 - 1.2 mg/dL   Bilirubin, Direct 0.2 0.0 - 0.2 mg/dL   Indirect Bilirubin 1.0 (H) 0.3 - 0.9 mg/dL    Comment: Performed at [facility], [address] [place], [place] [address]  Glucose, capillary     Status: Abnormal   Collection Time: 01/16/21  7:27 AM  Result Value  Ref Range   Glucose-Capillary 148 (H) 70 - 99 mg/dL    Comment: Glucose reference range applies only to samples taken after fasting for at least 8 hours.  Heparin level (unfractionated)     Status: None   Collection Time: 01/16/21 11:15 AM  Result Value Ref Range   Heparin Unfractionated 0.50 0.30 - 0.70 IU/mL    Comment: (NOTE) The clinical reportable range upper limit is being lowered to >1.10 to align with the FDA approved guidance for the current laboratory assay.  If heparin results are below expected values, and patient dosage has  been confirmed, suggest follow up testing of antithrombin III levels. Performed at [facility], [address] [place],  [address]   Glucose, capillary     Status: Abnormal   Collection Time: 01/16/21 11:18 AM  Result Value Ref Range   Glucose-Capillary 122 (H) 70 - 99 mg/dL    Comment: Glucose reference range applies only to samples taken after fasting for at least 8 hours.    CT ABDOMEN PELVIS WO CONTRAST  Result Date: 01/15/2021 CLINICAL DATA:  Abdominal distension and right upper quadrant abdominal pain. History of cholecystostomy tube removal on 12/06/2020 EXAM: CT ABDOMEN AND PELVIS WITHOUT CONTRAST TECHNIQUE: Multidetector CT imaging of the abdomen and pelvis was performed following the standard protocol without IV contrast. COMPARISON:  CT 09/23/2020, ultrasound 01/15/2021 FINDINGS: Lower chest: Bibasilar dependent atelectasis. Heart size is normal. No pericardial effusion. Hepatobiliary: Gallbladder is moderately distended with multiple hyperdense gallstones. Gallbladder wall appears diffusely thickened with pericholecystic inflammatory fat stranding and small volume of adjacent fluid. Unenhanced liver is within normal limits. Small volume perihepatic ascites. Pancreas: Unremarkable. No pancreatic ductal dilatation or surrounding inflammatory changes. Spleen: Unremarkable. Adrenals/Urinary Tract: Slightly nodular configuration of  the left adrenal gland is unchanged. Unremarkable right adrenal gland. Bilateral kidneys are within normal limits. No renal stone or hydronephrosis. Urinary bladder is unremarkable. Stomach/Bowel: Postsurgical changes again noted at the stomach. No evidence of bowel wall thickening, distention, or inflammatory changes. Vascular/Lymphatic: Scattered aortoiliac atherosclerotic calcifications without aneurysm. No abdominopelvic lymphadenopathy. Reproductive: Uterus and bilateral adnexa are  unremarkable. Other: No organized abdominopelvic fluid collection. No pneumoperitoneum. No abdominal wall hernia. Musculoskeletal: No new or acute bony findings. IMPRESSION: 1. Appearance of the gallbladder remains suspicious for acute cholecystitis. 2. Small volume perihepatic ascites. Aortic Atherosclerosis (ICD10-I70.0). Electronically Signed   By: Davina Poke D.O.   On: 01/15/2021 15:10   DG Chest 2 View  Result Date: 01/15/2021 CLINICAL DATA:  Pt arrived via [facility] with c/o upper abd pain, started a juice cleanse last Wednesday, states it was apple juice, Pt c/o N/V and abd pain. 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 Cardiac silhouette is normal in size. No mediastinal or hilar masses or evidence of adenopathy. Dependent opacity at both lung bases consistent with atelectasis. Remainder of the lungs is clear. No convincing pleural effusion and no pneumothorax. Skeletal structures are intact. IMPRESSION: No acute cardiopulmonary disease. Electronically Signed   By: Lajean Manes M.D.   On: 01/15/2021 13:37   DG Shoulder Right  Result Date: 01/15/2021 CLINICAL DATA:  Right shoulder pain EXAM: RIGHT SHOULDER - 2+ VIEW COMPARISON:  None. FINDINGS: No fracture or malalignment. Moderate AC joint degenerative change with mild glenohumeral degenerative  change. Small amount of calcific tendinopathy. IMPRESSION: 1. Degenerative changes without acute osseous abnormality. 2. Calcific tendinopathy Electronically Signed   By: Donavan Foil M.D.   On: 01/15/2021 18:08   US ABDOMEN LIMITED RUQ (LIVER/GB)  Result Date: 01/15/2021 CLINICAL DATA:  Right upper quadrant pain since this morning. History of a gallbladder drainage in June 2022. Drain removed in August 2022. EXAM: ULTRASOUND ABDOMEN LIMITED RIGHT UPPER QUADRANT COMPARISON:  Ultrasound, 09/26/2020. FINDINGS: Gallbladder: Gallbladder is distended. There are dependent stones and sludge. Wall thickened to between 4 and 8 mm. Small amount of pericholecystic fluid. Common bile duct: Diameter: 6 mm Liver: No focal lesion identified. Within normal limits in parenchymal echogenicity. Portal vein is patent on color Doppler imaging with normal direction of blood flow towards the liver. Other: Trace perihepatic fluid. IMPRESSION: 1. Distended gallbladder with wall thickening and dependent stones and sludge as well as a small amount of pericholecystic fluid. Findings are similar to the prior ultrasound and support acute cholecystitis in the proper clinical setting. Electronically Signed   By: Lajean Manes M.D.   On: 01/15/2021 14:13    Review of Systems  Constitutional:  Negative for chills and fever.  Gastrointestinal:  Positive for abdominal pain, nausea and vomiting.  Musculoskeletal:        Right shoulder pain  All other systems reviewed and are negative. Or as per the history of present illness Blood pressure (!) 110/46, pulse 83, temperature 98.3 F (36.8 C), temperature source Oral, resp. rate 18, height 5\' 3"  (1.6 m), weight 80 kg, SpO2 96 %. Body mass index is 31.24 kg/m.  Physical Exam Constitutional:      Appearance: She is obese. She is ill-appearing.  HENT:     Head: Normocephalic and atraumatic.     Nose: Nose normal.     Mouth/Throat:     Mouth: Mucous membranes are moist.  Eyes:      General: No scleral icterus. Cardiovascular:     Rate and Rhythm: Normal rate and regular rhythm.  Pulmonary:     Effort: Pulmonary effort is normal. No respiratory distress.  Abdominal:     General: There is distension.     Palpations: Abdomen is soft.  Comments: Tender to palpation in the right upper quadrant.  Voluntary guarding.  Murphy sign negative.  Genitourinary:    Comments: Deferred Musculoskeletal:        General: No deformity or signs of injury.  Skin:    General: Skin is warm and dry.     Coloration: Skin is not jaundiced.  Neurological:     Mental Status: She is alert. Mental status is at baseline.  Psychiatric:        Mood and Affect: Mood normal.        Behavior: Behavior normal.     Comments: She continues to endorse that she has eliminated her gallstones via a "cleanse."    Assessment/Plan: This is a 80 year old woman with extensive medical comorbidities.  She had a prior episode of cholecystitis in June of this year and received a percutaneous cholecystostomy tube.  Despite warning that she was likely to recur if she did not undergo stone extraction or leave the tube in place, she insisted that the tube be removed and that she would take care of her gallstones on her own.  Not surprisingly, the gallstones are still present and she has had a recurrent episode of cholecystitis.  I do not see any evidence that her overall health has improved since our last encounter and I would recommend replacing the cholecystostomy tube and discussing further with interventional radiology whether or not a Spyglass percutaneous stone extraction procedure might be feasible in her case.  This was discussed with the patient who was understandably reluctant given her belief that she had eliminated them, but I explained to her and even demonstrated for her that her stones were still present via demonstrating them for her on her CT scan and ultrasound imaging.  I am still deeply concerned that  surgical intervention would present unacceptable risk of morbidity and mortality in this patient.  Fredirick Maudlin 01/16/2021, 12:45 PM

## 2021-01-16 NOTE — Progress Notes (Signed)
Heparin bolus of 1000units given and rate change of 12.65ml/hr. Second verification Julie,RN.

## 2021-01-16 NOTE — Consult Note (Addendum)
Chief Complaint: Patient was seen in consultation today for acute cholecystitis at the request of Fredirick Maudlin, MD and Sidney Ace, MD  Referring Physician(s): Sidney Ace, MD Fredirick Maudlin, MD  Supervising Physician: Aletta Edouard  Patient Status: [facility] - In-pt  History of Present Illness: Gina Chambers is a 80 y.o. female known to our service with history of acute cholecystitis with cholelithiasis s/p 24F percutaneous cholecystostomy tube placement in IR 09/26/20. The patient has been seen and followed by surgery as an outpatient and remains a non surgical candidate given multiple co morbidities, COPD, history of COVID pneumonia. The patient was seen by IR in follow up on 7/14, 8/3 and 8/16 for cholangiogram imaging and spyglass procedure consult. The patient declined spyglass procedure and wished to have her cholecystostomy tube removed despite risk of recurrence.   The patient presented this admission to ED 9/26 with complaints of upper abdominal pain, nausea and vomiting. [place] and CT imaging demonstrates recurrent acute cholecystitis with cholelithiasis. IR received request for image guided placement of new percutaneous cholecystostomy catheter, surgery is aware of patient's admission and states patient is still not a surgical candidate. Today the patient admits to 4/10 abdominal pain after pain medications and nausea with dry heaving. She denies any chest pain currently and denies any complications to sedation previously outside of nausea.   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 last one approx 2013   Family history of adverse reaction to anesthesia    Mother - temporary paralysis of 1 side   GERD (gastroesophageal reflux disease)    Hypertension    Neuropathy     feet   PONV (postoperative nausea and vomiting)    Pt reports violent vomiting with ANY pain meds given with anesthesia.   Sleep apnea    CPAP   Thyroid disease    Vertigo    daily    Past Surgical History:  Procedure Laterality Date   CARDIAC SURGERY     CATARACT EXTRACTION W/PHACO Right 03/11/2017   Procedure: CATARACT EXTRACTION PHACO AND INTRAOCULAR LENS PLACEMENT ([place])  RIGHT DIABETIC;  Surgeon: Eulogio Bear, MD;  Location: [facility];  Service: Ophthalmology;  Laterality: Right;  Diabetic - insulin sleep apnea   CATARACT EXTRACTION W/PHACO Left 04/02/2017   Procedure: CATARACT EXTRACTION PHACO AND INTRAOCULAR LENS PLACEMENT ([place]) LEFT DIABETES;  Surgeon: Eulogio Bear, MD;  Location: [facility];  Service: Ophthalmology;  Laterality: Left;  Diabetic - insulin   COLONOSCOPY WITH PROPOFOL N/A 11/09/2016   Procedure: COLONOSCOPY WITH PROPOFOL;  Surgeon: Jonathon Bellows, MD;  Location: [facility] ENDOSCOPY;  Service: Gastroenterology;  Laterality: N/A;   HERNIA REPAIR     IR CHOLANGIOGRAM EXISTING TUBE  12/06/2020   IR RADIOLOGIST EVAL & MGMT  11/23/2020   VERTICAL BANDED GASTROPLASTY      Allergies: Ropinirole, Azelastine, Bupropion, Calcium, Carbidopa-levodopa, Cefuroxime, Cephalexin, Clinoril [sulindac], Codeine, Duloxetine, Duloxetine hcl, Ezetimibe, Fenofibrate, Furosemide, Glipizide, Lorazepam, Lortab [hydrocodone-acetaminophen], Metaproterenol, Morphine and related, Nalbuphine, Naproxen, Norfloxacin, Rofecoxib, Sodium, Statins, Sulfa antibiotics, Sulfasalazine, Tramadol, Trazodone and nefazodone, Doxycycline, Iodine, Ketoprofen, Piroxicam, and Tolmetin  Medications: Prior to Admission medications   Medication Sig Start Date End Date Taking? Authorizing Provider  aspirin EC 81 MG tablet Take 81 mg by mouth at bedtime.  Yes [provider]  bumetanide (BUMEX) 0.5 MG tablet Take 1 mg by mouth 2 (two) times daily.   Yes [provider]  Chromium  Picolinate 200 MCG CAPS Take 200 mcg by mouth daily.   Yes [provider]  clobetasol cream (TEMOVATE) 8.84 % Apply 1 application topically 2 (two) times daily.   Yes [provider]  diphenhydrAMINE (BENADRYL) 25 mg capsule Take 25 mg by mouth every 6 (six) hours as needed. 04/27/19  Yes [provider]  fluticasone (FLOVENT HFA) 110 MCG/ACT inhaler Inhale 2 puffs into the lungs 2 (two) times daily.   Yes [provider]  gabapentin (NEURONTIN) 100 MG capsule Take 100 mg by mouth 4 (four) times daily.   Yes [provider]  insulin glargine (LANTUS) 100 UNIT/ML injection Inject 10-20 Units into the skin See admin instructions. Inject 20u under the skin every morning and inject 10u under the skin ever night at bedtime   Yes [provider]  insulin regular (NOVOLIN R,HUMULIN R) 250 units/2.3mL (100 units/mL) injection Inject 10 Units into the skin 3 (three) times daily before meals.   Yes [provider]  Ipratropium-Albuterol (COMBIVENT) 20-100 MCG/ACT AERS respimat Inhale 1 puff into the lungs 4 (four) times daily as needed for wheezing or shortness of breath.   Yes [provider]  lansoprazole (PREVACID) 30 MG capsule Take 30 mg by mouth 2 (two) times daily.   Yes [provider]  levalbuterol Penne Lash) 0.63 MG/3ML nebulizer solution Inhale 0.63 mg into the lungs every 6 (six) hours as needed. 04/27/19  Yes [provider]  levothyroxine (SYNTHROID, LEVOTHROID) 125 MCG tablet Take 125 mcg by mouth daily.   Yes [provider]  Magnesium 500 MG TABS Take 500 mg by mouth 4 (four) times daily.    Yes [provider]  metoprolol succinate (TOPROL-XL) 50 MG 24 hr tablet Take 50 mg by mouth daily. Take with or immediately following a meal.   Yes [provider]  mirabegron ER (MYRBETRIQ) 25 MG TB24 tablet Take 25 mg by mouth daily.   Yes [provider]  montelukast (SINGULAIR) 10 MG  tablet Take 10 mg by mouth at bedtime.   Yes [provider]  Multiple Vitamins-Minerals (CENTRUM SILVER PO) Take 1 tablet by mouth daily.   Yes [provider]  Multiple Vitamins-Minerals (PRESERVISION AREDS 2 PO) Take 1 tablet by mouth 2 (two) times daily.   Yes [provider]  rivaroxaban (XARELTO) 20 MG TABS tablet Take 20 mg by mouth daily.   Yes [provider]  Ubiquinol 300 MG CAPS Take 1 capsule by mouth daily.   Yes [provider]  vitamin E 1000 UNIT capsule Take 1 capsule by mouth daily.   Yes [provider]  zinc gluconate 50 MG tablet Take 50 mg by mouth daily.   Yes [provider]     Family History  Problem Relation Age of Onset   Breast cancer Mother        57's   Breast cancer Maternal Grandmother    Breast cancer Paternal Grandmother     Social History   Socioeconomic History   Marital status: Single    Spouse name: Not on file   Number of children: Not on file   Years of education: Not on file   Highest education level: Not on file  Occupational History   Not on file  Tobacco Use   Smoking status: Never   Smokeless  tobacco: Never  Vaping Use   Vaping Use: Never used  Substance and Sexual Activity   Alcohol use: No   Drug use: No   Sexual activity: Not on file  Other Topics Concern   Not on file  Social History Narrative   Not on file   Social Determinants of Health   Financial Resource Strain: Not on file  Food Insecurity: Not on file  Transportation Needs: Not on file  Physical Activity: Not on file  Stress: Not on file  Social Connections: Not on file    Review of Systems: A 12 point ROS discussed and pertinent positives are indicated in the HPI above.  All other systems are negative.  Review of Systems  Vital Signs: BP (!) 110/46 (BP Location: Right Arm)   Pulse 83   Temp 98.3 F (36.8 C) (Oral)   Resp 18   Ht 5\' 3"  (1.6 m)   Wt 176 lb 5.9 oz (80 kg)   SpO2 96%    BMI 31.24 kg/m   Physical Exam Constitutional:      Appearance: She is ill-appearing.  HENT:     Head: Normocephalic and atraumatic.  Cardiovascular:     Rate and Rhythm: Normal rate and regular rhythm.  Pulmonary:     Effort: Pulmonary effort is normal. No respiratory distress.  Abdominal:     Palpations: Abdomen is soft.     Tenderness: There is abdominal tenderness.  Neurological:     Mental Status: She is alert and oriented to person, place, and time.    Imaging: CT ABDOMEN PELVIS WO CONTRAST  Result Date: 01/15/2021 CLINICAL DATA:  Abdominal distension and right upper quadrant abdominal pain. History of cholecystostomy tube removal on 12/06/2020 EXAM: CT ABDOMEN AND PELVIS WITHOUT CONTRAST TECHNIQUE: Multidetector CT imaging of the abdomen and pelvis was performed following the standard protocol without IV contrast. COMPARISON:  CT 09/23/2020, ultrasound 01/15/2021 FINDINGS: Lower chest: Bibasilar dependent atelectasis. Heart size is normal. No pericardial effusion. Hepatobiliary: Gallbladder is moderately distended with multiple hyperdense gallstones. Gallbladder wall appears diffusely thickened with pericholecystic inflammatory fat stranding and small volume of adjacent fluid. Unenhanced liver is within normal limits. Small volume perihepatic ascites. Pancreas: Unremarkable. No pancreatic ductal dilatation or surrounding inflammatory changes. Spleen: Unremarkable. Adrenals/Urinary Tract: Slightly nodular configuration of the left adrenal gland is unchanged. Unremarkable right adrenal gland. Bilateral kidneys are within normal limits. No renal stone or hydronephrosis. Urinary bladder is unremarkable. Stomach/Bowel: Postsurgical changes again noted at the stomach. No evidence of bowel wall thickening, distention, or inflammatory changes. Vascular/Lymphatic: Scattered aortoiliac atherosclerotic calcifications without aneurysm. No abdominopelvic lymphadenopathy. Reproductive: Uterus and  bilateral adnexa are unremarkable. Other: No organized abdominopelvic fluid collection. No pneumoperitoneum. No abdominal wall hernia. Musculoskeletal: No new or acute bony findings. IMPRESSION: 1. Appearance of the gallbladder remains suspicious for acute cholecystitis. 2. Small volume perihepatic ascites. Aortic Atherosclerosis (ICD10-I70.0). Electronically Signed   By: Davina Poke D.O.   On: 01/15/2021 15:10   DG Chest 2 View  Result Date: 01/15/2021 CLINICAL DATA:  Pt arrived via [facility] with c/o upper abd pain, started a juice cleanse last Wednesday, states it was apple juice, Pt c/o N/V and abd pain. 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 Cardiac silhouette is normal in size. No mediastinal or hilar masses or evidence of adenopathy. Dependent opacity at both lung bases consistent with atelectasis.  Remainder of the lungs is clear. No convincing pleural effusion and no pneumothorax. Skeletal structures are intact. IMPRESSION: No acute cardiopulmonary disease. Electronically Signed   By: Lajean Manes M.D.   On: 01/15/2021 13:37   DG Shoulder Right  Result Date: 01/15/2021 CLINICAL DATA:  Right shoulder pain EXAM: RIGHT SHOULDER - 2+ VIEW COMPARISON:  None. FINDINGS: No fracture or malalignment. Moderate AC joint degenerative change with mild glenohumeral degenerative change. Small amount of calcific tendinopathy. IMPRESSION: 1. Degenerative changes without acute osseous abnormality. 2. Calcific tendinopathy Electronically Signed   By: Donavan Foil M.D.   On: 01/15/2021 18:08   US ABDOMEN LIMITED RUQ (LIVER/GB)  Result Date: 01/15/2021 CLINICAL DATA:  Right upper quadrant pain since this morning. History of a gallbladder drainage in June 2022. Drain removed in August 2022. EXAM: ULTRASOUND ABDOMEN LIMITED RIGHT UPPER  QUADRANT COMPARISON:  Ultrasound, 09/26/2020. FINDINGS: Gallbladder: Gallbladder is distended. There are dependent stones and sludge. Wall thickened to between 4 and 8 mm. Small amount of pericholecystic fluid. Common bile duct: Diameter: 6 mm Liver: No focal lesion identified. Within normal limits in parenchymal echogenicity. Portal vein is patent on color Doppler imaging with normal direction of blood flow towards the liver. Other: Trace perihepatic fluid. IMPRESSION: 1. Distended gallbladder with wall thickening and dependent stones and sludge as well as a small amount of pericholecystic fluid. Findings are similar to the prior ultrasound and support acute cholecystitis in the proper clinical setting. Electronically Signed   By: Lajean Manes M.D.   On: 01/15/2021 14:13    Labs:  CBC: Recent Labs    09/25/20 0529 09/26/20 1039 01/15/21 1251 01/16/21 0506  WBC 34.2* 26.9* 14.2* 20.9*  HGB 10.5* 10.4* 13.1 13.1  HCT 31.5* 30.8* 38.6 38.7  PLT 279 376 128* 127*    COAGS: Recent Labs    09/25/20 0529 01/15/21 1907 01/16/21 0225  INR 1.3* 1.1  --   APTT  --  136* 36    BMP: Recent Labs    09/26/20 0443 09/27/20 0536 01/15/21 1251 01/16/21 0506  NA 128* 131* 132* 134*  K 5.4* 3.9 4.2 4.5  CL 96* 96* 101 104  CO2 22 27 23 25   GLUCOSE 150* 164* 210* 153*  BUN 29* 25* 16 23  CALCIUM 8.5* 8.5* 9.7 8.4*  CREATININE 1.02* 1.05* 0.69 1.50*  GFRNONAA 56* 54* >60 35*    LIVER FUNCTION TESTS: Recent Labs    09/24/20 0513 09/25/20 0529 01/15/21 1251 01/16/21 0506  BILITOT 1.4* 0.9 1.2 1.2  AST 61* 63* 31 23  ALT 35 39 23 16  ALKPHOS 87 108 99 70  PROT 5.6* 6.4* 6.8 5.1*  ALBUMIN 2.4* 2.7* 3.9 2.9*    Assessment and Plan: 80 year old female known to our service with history of acute cholecystitis with cholelithiasis s/p 85F percutaneous cholecystostomy tube placement in IR 09/26/20. The patient has been seen and followed by surgery and remains a non surgical candidate  given multiple respiratory co morbidities. The patient was seen by IR in follow up on 7/14, 8/3 and 8/16 for cholangiogram and spyglass procedure consult. The patient declined spyglass procedure and wished to have her cholecystostomy tube removed despite risk of recurrence.   The patient presented this admission to ED 9/26 with complaints of upper abdominal pain, nausea and vomiting. [place] and CT imaging demonstrates recurrent acute cholecystitis with cholelithiasis. Wbc elevated, currently on IV Zosyn, vitals currently stable. IR received request for image guided placement of new percutaneous cholecystostomy catheter,  surgery is aware of patient's admission and states patient is still not a surgical candidate.   History of DVT, patient states she has not taken Xarelto for days, currently on heparin infusion- will be stopped 3 hours prior to IR procedure.   The patient has been NPO, no blood thinners taken- last Xarelto >5 days ago, labs and vitals have been reviewed.  Risks and benefits discussed with the patient including, but not limited to bleeding, infection, gallbladder perforation, bile leak, or sepsis. The patient understands the need for life long cholecystostomy tube with routine exchanges if she does not pursue additional procedures to break up the gallstones. She states she is willing to undergo spyglass procedure this time and we will set this appointment up in our clinic in 8 weeks.   All of the patient's questions were answered, patient is agreeable to proceed. Consent signed and in chart.   Thank you for this interesting consult.  I greatly enjoyed meeting SUNDI SLEVIN and look forward to participating in their care.  A copy of this report was sent to the requesting provider on this date.  Electronically Signed: Hedy Jacob, PA-C 01/16/2021, 12:30 PM   I spent a total of 20 Minutes in face to face in clinical consultation, greater than 50% of which was counseling/coordinating  care for recurrent acute cholecystitis with cholelithiasis.

## 2021-01-17 DIAGNOSIS — K810 Acute cholecystitis: Secondary | ICD-10-CM | POA: Diagnosis not present

## 2021-01-17 LAB — COMPREHENSIVE METABOLIC PANEL
ALT: 17 U/L (ref 0–44)
AST: 20 U/L (ref 15–41)
Albumin: 2.6 g/dL — ABNORMAL LOW (ref 3.5–5.0)
Alkaline Phosphatase: 73 U/L (ref 38–126)
Anion gap: 8 (ref 5–15)
BUN: 32 mg/dL — ABNORMAL HIGH (ref 8–23)
CO2: 23 mmol/L (ref 22–32)
Calcium: 8.3 mg/dL — ABNORMAL LOW (ref 8.9–10.3)
Chloride: 102 mmol/L (ref 98–111)
Creatinine, Ser: 1.58 mg/dL — ABNORMAL HIGH (ref 0.44–1.00)
GFR, Estimated: 33 mL/min — ABNORMAL LOW (ref >60)
Glucose, Bld: 138 mg/dL — ABNORMAL HIGH (ref 70–99)
Potassium: 4.3 mmol/L (ref 3.5–5.1)
Sodium: 133 mmol/L — ABNORMAL LOW (ref 135–145)
Total Bilirubin: 1.2 mg/dL (ref 0.3–1.2)
Total Protein: 5.6 g/dL — ABNORMAL LOW (ref 6.5–8.1)

## 2021-01-17 LAB — GLUCOSE, CAPILLARY
Glucose-Capillary: 128 mg/dL — ABNORMAL HIGH (ref 70–99)
Glucose-Capillary: 117 mg/dL — ABNORMAL HIGH (ref 70–99)
Glucose-Capillary: 119 mg/dL — ABNORMAL HIGH (ref 70–99)
Glucose-Capillary: 136 mg/dL — ABNORMAL HIGH (ref 70–99)

## 2021-01-17 LAB — CBC
HCT: 35.6 % — ABNORMAL LOW (ref 36.0–46.0)
Hemoglobin: 11.6 g/dL — ABNORMAL LOW (ref 12.0–15.0)
MCH: 28.9 pg (ref 26.0–34.0)
MCHC: 32.6 g/dL (ref 30.0–36.0)
MCV: 88.6 fL (ref 80.0–100.0)
Platelets: 107 10*3/uL — ABNORMAL LOW (ref 150–400)
RBC: 4.02 MIL/uL (ref 3.87–5.11)
RDW: 14.3 % (ref 11.5–15.5)
WBC: 21.3 10*3/uL — ABNORMAL HIGH (ref 4.0–10.5)
nRBC: 0 % (ref 0.0–0.2)

## 2021-01-17 MED ORDER — RIVAROXABAN 20 MG PO TABS
20.0000 mg | ORAL_TABLET | Freq: Every day | ORAL | Status: DC
  Administered 2021-01-17 – 2021-01-31 (×13): 20 mg via ORAL
  Filled 2021-01-17 (×14): qty 1

## 2021-01-17 NOTE — Progress Notes (Signed)
PROGRESS NOTE    Gina Chambers  OEH:[id number] DOB: 1940-11-24 DOA: 01/15/2021 PCP: Kirk Ruths, MD   Brief Narrative: 80 y.o. female with medical history significant of hypertension, diabetes mellitus, COPD, asthma, GERD, hypothyroidism, OSA on CPAP, DVT on Xarelto, dCHF, vertigo, GI bleeding, COVID-19 infection, cholecystitis, who presents with abdominal pain.   Her abdominal pain started this morning, which is located in the right upper quadrant, constant, sharp, severe, radiating to the right shoulder.  Patient complains of right shoulder pain, particularly on movement.  Associated with nausea and nonbilious nonbloody vomiting for more than 10 times.  No fever or chills.  No diarrhea.  Patient denies chest pain, cough, shortness breath.  No symptoms of UTI.   Of note, pt had hx of cholecystitis in June 2022. She was treated with IV Zosyn in hospital and discharged on Augmentin.  She had a Coley ostomy tube placed, which was supposed to remain in place for 6 to 8 weeks.  The cholecystostomy was subsequently removed at patient's request.  I discussed the case with interventional radiology.  They are very familiar with the patient.  They relayed that they told the patient that if the cholecystostomy tube was discontinued the patient was very high risk for recurrence.  Of note the patient is very reluctant to have cholecystostomy tube replaced.  She desires for surgical intervention.  I explained that per our general surgeon the patient is too high risk for complications with a surgical procedure and this is why we are recommending cholecystostomy.  We may be able to do a percutaneous stone retrieval which apparently is been offered to her in the past.  After repeated discussions patient agreed to have cholecystostomy tube placed.  This was done by interventional radiology on 9/26.  Tolerated procedure well.  Remains on IV antibiotics.  Cultures with GPC.   Assessment & Plan:    Principal Problem:   Acute cholecystitis Active Problems:   Acquired hypothyroidism   Essential hypertension   Type 2 diabetes mellitus without complication (HCC)   COPD (chronic obstructive pulmonary disease) (HCC)   DVT (deep venous thrombosis) (HCC)   OSA on CPAP   Chronic diastolic CHF (congestive heart failure) (HCC)   Sepsis (HCC)   Right shoulder pain  Sepsis due to acute cholecystitis Patient meets criteria for sepsis with leukocytosis with WBC 14.2, tachycardia with heart rate in 95.  Fever T-max 100.8.  Currently hemodynamically stable.     Dr. Celine Ahr of general surgeon is consulted, thinks patient is a poor candidate for surgery.  She recommend IR consult for "percutaneous cholecystostomy tube placement and reevaluation of the patient's candidacy for Spyglass percutaneous stone extraction".  Discussed with interventional radiology  Status post cholecystostomy tube and IR on 9/26  Plan: Continue PTC tube.  Monitor and record output Will need to maintain tube for 6 to 8 weeks Continue Zosyn, follow cultures, currently with GPC's Monitor vitals and fever curve Serial abdominal examinations Surgical follow-up    Acquired hypothyroidism -Synthroid   Essential hypertension -IV hydralazine as needed -Metoprolol   Type 2 diabetes mellitus without complication (HCC)  Recent A1c 6.1, well controlled.   Patient is taking Lantus 20-10 unit twice daily and Novolin at home -Sliding scale insulin -Semglee 10 units every morning, 5 units every afternoon   COPD (chronic obstructive pulmonary disease) (HCC)  Stable -Bronchodilators   DVT (deep venous thrombosis) ([place]): Patient was on Xarelto at home.  She has been off Xarelto for 4 to 5 days  as she has been on a "cleanse" to deal with her cholecystitis.   -Resume home Xarelto today 9/27   OSA - on CPAP   Chronic diastolic CHF (congestive heart failure) ([place])  2D echo on 6/60/22 showed EF of 55-60%.  Patient has trace  leg edema, no pulm edema chest x-ray.  CHF seem to be compensated. -Bumex on hold for now -Creatinine 1.58 -Consider restarting Bumex on 9/28   Right shoulder pain Possibly due to right upper quadrant abdominal pain radiating to the shoulder -X-ray negative for fracture -As needed Tylenol   DVT prophylaxis: IV heparin Code Status: Full Family Communication: Daughter Wells Guiles [phone] on 9/27 Disposition Plan: Status is: Inpatient  Remains inpatient appropriate because:Inpatient level of care appropriate due to severity of illness  Dispo: The patient is from: Home              Anticipated d/c is to: Home              Patient currently is not medically stable to d/c.   Difficult to place patient No       Level of care: Med-Surg  Consultants:  IR General surgery  Procedures:  None  Antimicrobials:  Zosyn   Subjective: Seen and examined.  Has cholecystostomy tube in place.  Pain better controlled this morning.  Objective: Vitals:   01/17/21 0016 01/17/21 0022 01/17/21 0443 01/17/21 0801  BP:  (!) 113/45 (!) 122/50 106/79  Pulse: 99 (!) 101 87 92  Resp:   20 19  Temp:   98 F (36.7 C) 98.3 F (36.8 C)  TempSrc:    Oral  SpO2: 93%  95% 95%  Weight:      Height:        Intake/Output Summary (Last 24 hours) at 01/17/2021 1203 Last data filed at 01/17/2021 0900 Gross per 24 hour  Intake 3105.29 ml  Output 920 ml  Net 2185.29 ml   Filed Weights   01/15/21 1250 01/16/21 0452  Weight: 79.8 kg 80 kg    Examination:  General exam: No acute distress Respiratory system: Clear to auscultation. Respiratory effort normal. Cardiovascular system: S1-S2, RRR, no murmurs, no pedal edema Gastrointestinal system: Obese, nondistended, tender to palpation right upper quadrant, cholecystostomy tube in place Central nervous system: Alert and oriented. No focal neurological deficits. Extremities: Symmetric 5 x 5 power. Skin: No rashes, lesions or ulcers Psychiatry:  Judgement and insight appear normal. Mood & affect appropriate.     Data Reviewed: I have personally reviewed following labs and imaging studies  CBC: Recent Labs  Lab 01/15/21 1251 01/16/21 0506 01/17/21 0434  WBC 14.2* 20.9* 21.3*  HGB 13.1 13.1 11.6*  HCT 38.6 38.7 35.6*  MCV 87.1 86.4 88.6  PLT 128* 127* 166*   Basic Metabolic Panel: Recent Labs  Lab 01/15/21 1251 01/16/21 0506 01/17/21 0434  NA 132* 134* 133*  K 4.2 4.5 4.3  CL 101 104 102  CO2 _0 GLUCOSE 210* 153* 138*  BUN 16 23 32*  CREATININE 0.69 1.50* 1.58*  CALCIUM 9.7 8.4* 8.3*   GFR: Estimated Creatinine Clearance: 29.4 mL/min (A) (by C-G formula based on SCr of 1.58 mg/dL (H)). Liver Function Tests: Recent Labs  Lab 01/15/21 1251 01/16/21 0506 01/17/21 0434  AST _1 ALT _2 ALKPHOS 99 70 73  BILITOT 1.2 1.2 1.2  PROT 6.8 5.1* 5.6*  ALBUMIN 3.9 2.9* 2.6*   Recent Labs  Lab 01/15/21 1251  LIPASE 28  No results for input(s): AMMONIA in the last 168 hours. Coagulation Profile: Recent Labs  Lab 01/15/21 1907  INR 1.1   Cardiac Enzymes: No results for input(s): CKTOTAL, CKMB, CKMBINDEX, TROPONINI in the last 168 hours. BNP (last 3 results) No results for input(s): PROBNP in the last 8760 hours. HbA1C: No results for input(s): HGBA1C in the last 72 hours. CBG: Recent Labs  Lab 01/16/21 1118 01/16/21 1636 01/16/21 2111 01/17/21 0801 01/17/21 1153  GLUCAP 122* 129* 168* 128* 117*   Lipid Profile: No results for input(s): CHOL, HDL, LDLCALC, TRIG, CHOLHDL, LDLDIRECT in the last 72 hours. Thyroid Function Tests: No results for input(s): TSH, T4TOTAL, FREET4, T3FREE, THYROIDAB in the last 72 hours. Anemia Panel: No results for input(s): VITAMINB12, FOLATE, FERRITIN, TIBC, IRON, RETICCTPCT in the last 72 hours. Sepsis Labs: Recent Labs  Lab 01/15/21 1907 01/15/21 2131  PROCALCITON 0.18  --   LATICACIDVEN 2.3* 1.3    Recent Results (from the past 240  hour(s))  Resp Panel by RT-PCR (Flu A&B, Covid) Nasopharyngeal Swab     Status: None   Collection Time: 01/15/21  2:41 PM   Specimen: Nasopharyngeal Swab; Nasopharyngeal(NP) swabs in vial transport medium  Result Value Ref Range Status   SARS Coronavirus 2 by RT PCR NEGATIVE NEGATIVE Final    Comment: (NOTE) SARS-CoV-2 target nucleic acids are NOT DETECTED.  The SARS-CoV-2 RNA is generally detectable in upper respiratory specimens during the acute phase of infection. The lowest concentration of SARS-CoV-2 viral copies this assay can detect is 138 copies/mL. A negative result does not preclude SARS-Cov-2 infection and should not be used as the sole basis for treatment or other patient management decisions. A negative result may occur with  improper specimen collection/handling, submission of specimen other than nasopharyngeal swab, presence of viral mutation(s) within the areas targeted by this assay, and inadequate number of viral copies(<138 copies/mL). A negative result must be combined with clinical observations, patient history, and epidemiological information. The expected result is Negative.  Fact Sheet for Patients:  EntrepreneurPulse.com.au  Fact Sheet for Healthcare Providers:  IncredibleEmployment.be  This test is no t yet approved or cleared by the [place] FDA and  has been authorized for detection and/or diagnosis of SARS-CoV-2 by FDA under an Emergency Use Authorization (EUA). This EUA will remain  in effect (meaning this test can be used) for the duration of the COVID-19 declaration under Section 564(b)(1) of the Act, 21 U.S.C.section 360bbb-3(b)(1), unless the authorization is terminated  or revoked sooner.       Influenza A by PCR NEGATIVE NEGATIVE Final   Influenza B by PCR NEGATIVE NEGATIVE Final    Comment: (NOTE) The Xpert Xpress SARS-CoV-2/FLU/RSV plus assay is intended as an aid in the diagnosis of influenza from  Nasopharyngeal swab specimens and should not be used as a sole basis for treatment. Nasal washings and aspirates are unacceptable for Xpert Xpress SARS-CoV-2/FLU/RSV testing.  Fact Sheet for Patients: EntrepreneurPulse.com.au  Fact Sheet for Healthcare Providers: IncredibleEmployment.be  This test is not yet approved or cleared by the [place] FDA and has been authorized for detection and/or diagnosis of SARS-CoV-2 by FDA under an Emergency Use Authorization (EUA). This EUA will remain in effect (meaning this test can be used) for the duration of the COVID-19 declaration under Section 564(b)(1) of the Act, 21 U.S.C. section 360bbb-3(b)(1), unless the authorization is terminated or revoked.  Performed at [facility], [address], [place], [place] [address]   Culture, blood (x 2)  Status: None (Preliminary result)   Collection Time: 01/15/21  7:08 PM   Specimen: BLOOD  Result Value Ref Range Status   Specimen Description BLOOD BLOOD LEFT HAND  Final   Special Requests   Final    BOTTLES DRAWN AEROBIC ONLY Blood Culture adequate volume   Culture   Final    NO GROWTH 2 DAYS Performed at [facility], [address] [place], [place] [address]    Report Status PENDING  Incomplete  Culture, blood (x 2)     Status: None (Preliminary result)   Collection Time: 01/15/21  8:05 PM   Specimen: BLOOD  Result Value Ref Range Status   Specimen Description BLOOD BLOOD LEFT HAND  Final   Special Requests   Final    BOTTLES DRAWN AEROBIC ONLY Blood Culture adequate volume   Culture   Final    NO GROWTH 2 DAYS Performed at [facility], [address] [place], [place] [address]    Report Status PENDING  Incomplete  Aerobic/Anaerobic Culture w Gram Stain (surgical/deep wound)     Status: None (Preliminary result)   Collection Time: 01/16/21  3:40 PM   Specimen: Gallbladder; Bile  Result Value Ref Range Status    Specimen Description   Final    GALL BLADDER Performed at [facility], [address], [place], [place] [address]    Special Requests   Final    Normal Performed at [facility], [address] [place], [place] [address]    Gram Stain   Final    NO SQUAMOUS EPITHELIAL CELLS SEEN FEW WBC SEEN FEW GRAM POSITIVE COCCI    Culture   Final    CULTURE REINCUBATED FOR BETTER GROWTH Performed at [facility], [place] [address] [place], [place] [address]    Report Status PENDING  Incomplete         Radiology Studies: CT ABDOMEN PELVIS WO CONTRAST  Result Date: 01/15/2021 CLINICAL DATA:  Abdominal distension and right upper quadrant abdominal pain. History of cholecystostomy tube removal on 12/06/2020 EXAM: CT ABDOMEN AND PELVIS WITHOUT CONTRAST TECHNIQUE: Multidetector CT imaging of the abdomen and pelvis was performed following the standard protocol without IV contrast. COMPARISON:  CT 09/23/2020, ultrasound 01/15/2021 FINDINGS: Lower chest: Bibasilar dependent atelectasis. Heart size is normal. No pericardial effusion. Hepatobiliary: Gallbladder is moderately distended with multiple hyperdense gallstones. Gallbladder wall appears diffusely thickened with pericholecystic inflammatory fat stranding and small volume of adjacent fluid. Unenhanced liver is within normal limits. Small volume perihepatic ascites. Pancreas: Unremarkable. No pancreatic ductal dilatation or surrounding inflammatory changes. Spleen: Unremarkable. Adrenals/Urinary Tract: Slightly nodular configuration of the left adrenal gland is unchanged. Unremarkable right adrenal gland. Bilateral kidneys are within normal limits. No renal stone or hydronephrosis. Urinary bladder is unremarkable. Stomach/Bowel: Postsurgical changes again noted at the stomach. No evidence of bowel wall thickening, distention, or inflammatory changes. Vascular/Lymphatic: Scattered aortoiliac atherosclerotic calcifications without  aneurysm. No abdominopelvic lymphadenopathy. Reproductive: Uterus and bilateral adnexa are unremarkable. Other: No organized abdominopelvic fluid collection. No pneumoperitoneum. No abdominal wall hernia. Musculoskeletal: No new or acute bony findings. IMPRESSION: 1. Appearance of the gallbladder remains suspicious for acute cholecystitis. 2. Small volume perihepatic ascites. Aortic Atherosclerosis (ICD10-I70.0). Electronically Signed   By: Davina Poke D.O.   On: 01/15/2021 15:10   DG Chest 2 View  Result Date: 01/15/2021 CLINICAL DATA:  Pt arrived via [facility] with c/o upper abd pain, started a juice cleanse last Wednesday, states it was apple juice, Pt c/o  N/V and abd pain. 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 Cardiac silhouette is normal in size. No mediastinal or hilar masses or evidence of adenopathy. Dependent opacity at both lung bases consistent with atelectasis. Remainder of the lungs is clear. No convincing pleural effusion and no pneumothorax. Skeletal structures are intact. IMPRESSION: No acute cardiopulmonary disease. Electronically Signed   By: Lajean Manes M.D.   On: 01/15/2021 13:37   DG Shoulder Right  Result Date: 01/15/2021 CLINICAL DATA:  Right shoulder pain EXAM: RIGHT SHOULDER - 2+ VIEW COMPARISON:  None. FINDINGS: No fracture or malalignment. Moderate AC joint degenerative change with mild glenohumeral degenerative change. Small amount of calcific tendinopathy. IMPRESSION: 1. Degenerative changes without acute osseous abnormality. 2. Calcific tendinopathy Electronically Signed   By: Donavan Foil M.D.   On: 01/15/2021 18:08   IR Perc Cholecystostomy  Result Date: 01/16/2021 CLINICAL DATA:  Recurrent acute cholecystitis and status post prior percutaneous cholecystostomy tube placement on 09/26/2020. The  tube was removed on 12/06/2020 after the patient insisted it be removed. She has developed recurrent acute cholecystitis and sepsis and is not a candidate currently for cholecystectomy. EXAM: PERCUTANEOUS CHOLECYSTOSTOMY COMPARISON:  CT of the abdomen and pelvis on 01/15/2021 ANESTHESIA/SEDATION: Moderate (conscious) sedation was employed during this procedure. A total of Versed 1.0 mg and Fentanyl 100 mcg was administered intravenously. Moderate Sedation Time: 25 minutes. The patient's level of consciousness and vital signs were monitored continuously by radiology nursing throughout the procedure under my direct supervision. CONTRAST:  48m OMNIPAQUE IOHEXOL 350 MG/ML SOLN MEDICATIONS: No additional medications were administered. FLUOROSCOPY TIME:  30 seconds.  8.1 mGy. PROCEDURE: The procedure, risks, benefits, and alternatives were explained to the patient. Questions regarding the procedure were encouraged and answered. The patient understands and consents to the procedure. A time-out was performed prior to initiating the procedure. The right abdominal wall was prepped with chlorhexidine in a sterile fashion, and a sterile drape was applied covering the operative field. A sterile gown and sterile gloves were used for the procedure. Local anesthesia was provided with 1% Lidocaine. Ultrasound image documentation was performed. Fluoroscopy during the procedure and fluoro spot radiograph confirms appropriate catheter position. Ultrasound was utilized to localize the gallbladder. Under direct ultrasound guidance, an 18 gauge trocar needle was advanced into the gallbladder lumen. Aspiration was performed and a bile sample sent for culture studies. A small amount of diluted contrast material was injected. A guide wire was then advanced into the gallbladder. Percutaneous tract dilatation was then performed over a guide wire to 10-French. A 10-French pigtail drainage catheter was then advanced into the gallbladder lumen  under fluoroscopy. Catheter was formed and injected with contrast material to confirm position. The catheter was flushed and connected to a gravity drainage bag. It was secured at the skin with a Prolene retention suture and Stat-Lock device. COMPLICATIONS: None FINDINGS: After needle puncture of the gallbladder, a bile sample was aspirated and sent for culture. Bile return was dark in color with debris. The cholecystostomy tube was advanced into the gallbladder lumen and formed. It is now draining bile. This tube will be left to gravity drainage. IMPRESSION: Percutaneous cholecystostomy with placement of 10-French drainage catheter into the gallbladder lumen. This was left to gravity drainage. Electronically Signed   By: GAletta EdouardM.D.   On: 01/16/2021 16:17   [place] LIMITED RUQ (LIVER/GB)  Result Date: 01/15/2021 CLINICAL DATA:  Right upper quadrant pain since this morning. History of a gallbladder drainage in June 2022. Drain removed in August 2022. EXAM: ULTRASOUND ABDOMEN LIMITED RIGHT UPPER QUADRANT COMPARISON:  Ultrasound, 09/26/2020. FINDINGS: Gallbladder: Gallbladder is distended. There are dependent stones and sludge. Wall thickened to between 4 and 8 mm. Small amount of pericholecystic fluid. Common bile duct: Diameter: 6 mm Liver: No focal lesion identified. Within normal limits in parenchymal echogenicity. Portal vein is patent on color Doppler imaging with normal direction of blood flow towards the liver. Other: Trace perihepatic fluid. IMPRESSION: 1. Distended gallbladder with wall thickening and dependent stones and sludge as well as a small amount of pericholecystic fluid. Findings are similar to the prior ultrasound and support acute cholecystitis in the proper clinical setting. Electronically Signed   By: Lajean Manes M.D.   On: 01/15/2021 14:13        Scheduled Meds:  aspirin EC  81 mg Oral QHS   fluticasone  2 puff Inhalation BID   gabapentin  100 mg Oral QID   insulin  aspart  0-5 Units Subcutaneous QHS   insulin aspart  0-9 Units Subcutaneous TID WC   insulin glargine-yfgn  10 Units Subcutaneous Daily   And   insulin glargine-yfgn  5 Units Subcutaneous QHS   levothyroxine  125 mcg Oral Q0600   metoprolol succinate  50 mg Oral Daily   montelukast  10 mg Oral QHS   multivitamin with minerals   Oral Daily   pantoprazole  20 mg Oral Daily   sodium chloride flush  5 mL Intracatheter Q8H   Continuous Infusions:  sodium chloride 100 mL/hr at 01/17/21 0715   piperacillin-tazobactam (ZOSYN)  IV 12.5 mL/hr at 01/17/21 0715     LOS: 2 days    Time spent: 35 minutes    Sidney Ace, MD [facility] Pager [phone]  If 7PM-7AM, please contact night-coverage 01/17/2021, 12:03 PM

## 2021-01-17 NOTE — Progress Notes (Signed)
ANTICOAGULATION CONSULT NOTE  Pharmacy Consult for restarting rivaroxaban Indication: VTE prophylaxis  Patient Measurements: Height: 5\' 3"  (160 cm) Weight: 80 kg (176 lb 5.9 oz) IBW/kg (Calculated) : 52.4  Vital Signs: Temp: 98.3 F (36.8 C) (09/27 0801) Temp Source: Oral (09/27 0801) BP: 106/79 (09/27 0801) Pulse Rate: 92 (09/27 0801)  Labs: Recent Labs    01/15/21 1251 01/15/21 1907 01/16/21 0225 01/16/21 0506 01/16/21 1115 01/16/21 1810 01/17/21 0434  HGB 13.1  --   --  13.1  --   --  11.6*  HCT 38.6  --   --  38.7  --   --  35.6*  PLT 128*  --   --  127*  --   --  107*  APTT  --  136* 36  --   --   --   --   LABPROT  --  13.7  --   --   --   --   --   INR  --  1.1  --   --   --   --   --   HEPARINUNFRC  --   --  0.20*  --  0.50 <0.10*  --   CREATININE 0.69  --   --  1.50*  --   --  1.58*  TROPONINIHS 10  --   --   --   --   --   --     Estimated Creatinine Clearance: 29.4 mL/min (A) (by C-G formula based on SCr of 1.58 mg/dL (H)).   Medical History: Past Medical History:  Diagnosis Date   Asthma    Asthmatic bronchitis , chronic (HCC)    CHF (congestive heart failure) ([place])    Diabetes mellitus without complication ([place])    DVT (deep venous thrombosis) (HCC)    x3 - both legs. last one approx 2013   Family history of adverse reaction to anesthesia    Mother - temporary paralysis of 1 side   GERD (gastroesophageal reflux disease)    Hypertension    Neuropathy    feet   PONV (postoperative nausea and vomiting)    Pt reports violent vomiting with ANY pain meds given with anesthesia.   Sleep apnea    CPAP   Thyroid disease    Vertigo    daily    Medications:  Scheduled:   aspirin EC  81 mg Oral QHS   fluticasone  2 puff Inhalation BID   gabapentin  100 mg Oral QID   insulin aspart  0-5 Units Subcutaneous QHS   insulin aspart  0-9 Units Subcutaneous TID WC   insulin glargine-yfgn  10 Units Subcutaneous Daily   And   insulin glargine-yfgn  5  Units Subcutaneous QHS   levothyroxine  125 mcg Oral Q0600   metoprolol succinate  50 mg Oral Daily   montelukast  10 mg Oral QHS   multivitamin with minerals   Oral Daily   pantoprazole  20 mg Oral Daily   sodium chloride flush  5 mL Intracatheter Q8H    Assessment: 80 y.o. female with medical history significant of hypertension, diabetes mellitus, COPD, asthma, GERD, hypothyroidism, OSA on CPAP, DVT on Xarelto, dCHF, vertigo, GI bleeding, COVID-19 infection, cholecystitis, who presents with abdominal pain. Patient was on Xarelto at home but was transitioned to heparin here for cholecystostomy tube and IR on 9/26.  She has been off Xarelto for 4 to 5 days prior to admission as she had been on a "cleanse" to deal with  her cholecystitis. Today her renal function is noted to be significantly reduced compared to her baseline level.  Goal of Therapy:  Monitor platelets by anticoagulation protocol: Yes   Plan:  Restart  rivaroxaban po 20 mg once daily with food CrCl ?30 mL/minute (currently 37 mL/min): No dosage adjustment necessary Follow renal function for potential alternative agent if declines continue CBC at least once weekly per protocol  Dallie Piles 01/17/2021,12:37 PM

## 2021-01-17 NOTE — Progress Notes (Signed)
[facility] SURGICAL PROGRESS NOTE (cpt [phone])  Hospital Day(s): 2.   Interval History: Patient seen and examined, no acute events or new complaints overnight. Patient reports she is still not feeling very well this morning. Having abdominal pain, worse in the RUQ which radiates to her right shoulder. Also complaining of nausea and decreased appetite. No fever, chills, emesis. Leukocytosis slightly worse this morning to 21.3K (may be transient after drain placement). Renal function remains elevated; sCr - 1.58; UO - 550 ccs + unmeasured. Mild hyponatremia to 133 o/w no significant electrolyte derangements. Percutaneous cholecystostomy tube placed with IR yesterday (09/26); output 370 and bilious. Cx from this growing [place]. She continues on Zosyn. On carb modified diet but not eating.   Review of Systems:  Constitutional: denies fever, chills  HEENT: denies cough or congestion  Respiratory: denies any shortness of breath  Cardiovascular: denies chest pain or palpitations  Gastrointestinal: + abdominal pain, + nausea, denied emesis Genitourinary: denies burning with urination or urinary frequency  Vital signs in last 24 hours: [min-max] current  Temp:  [98 F (36.7 C)-100.3 F (37.9 C)] 98.3 F (36.8 C) (09/27 0801) Pulse Rate:  [81-101] 92 (09/27 0801) Resp:  [10-26] 19 (09/27 0801) BP: (86-130)/(43-79) 106/79 (09/27 0801) SpO2:  [89 %-100 %] 95 % (09/27 0801)     Height: 5\' 3"  (160 cm) Weight: 80 kg BMI (Calculated): 31.25   Intake/Output last 2 shifts:  09/26 0701 - 09/27 0700 In: 2897.3 [P.O.:480; I.V.:2307.3; IV Piggyback:100] Out: 920 [Urine:550; Drains:370]   Physical Exam:  Constitutional: alert, cooperative and no distress  HENT: normocephalic without obvious abnormality  Eyes: PERRL, EOM's grossly intact and symmetric  Respiratory: breathing non-labored at rest  Cardiovascular: regular rate and sinus rhythm  Gastrointestinal: soft, obese, tenderness  primarily over drain site in RUQ, non-distended, no rebound/guarding. Cholecystostomy tube in the RUQ with bilious output  Musculoskeletal: no edema or wounds, motor and sensation grossly intact, NT    Labs:  CBC Latest Ref Rng & Units 01/17/2021 01/16/2021 01/15/2021  WBC 4.0 - 10.5 K/uL 21.3(H) 20.9(H) 14.2(H)  Hemoglobin 12.0 - 15.0 g/dL 11.6(L) 13.1 13.1  Hematocrit 36.0 - 46.0 % 35.6(L) 38.7 38.6  Platelets 150 - 400 K/uL 107(L) 127(L) 128(L)   CMP Latest Ref Rng & Units 01/17/2021 01/16/2021 01/15/2021  Glucose 70 - 99 mg/dL 138(H) 153(H) 210(H)  BUN 8 - 23 mg/dL 32(H) 23 16  Creatinine 0.44 - 1.00 mg/dL 1.58(H) 1.50(H) 0.69  Sodium 135 - 145 mmol/L 133(L) 134(L) 132(L)  Potassium 3.5 - 5.1 mmol/L 4.3 4.5 4.2  Chloride 98 - 111 mmol/L 102 104 101  CO2 22 - 32 mmol/L 23 25 23   Calcium 8.9 - 10.3 mg/dL 8.3(L) 8.4(L) 9.7  Total Protein 6.5 - 8.1 g/dL 5.6(L) 5.1(L) 6.8  Total Bilirubin 0.3 - 1.2 mg/dL 1.2 1.2 1.2  Alkaline Phos 38 - 126 U/L 73 70 99  AST 15 - 41 U/L 20 23 31   ALT 0 - 44 U/L 17 16 23      Imaging studies: No new pertinent imaging studies   Assessment/Plan: (ICD-10's: K81.0) 80 y.o. female with recurrent cholecystitis s/p percutaneous cholecystostomy tube placement on 54/27, complicated by numerous pertinent comorbidities.   - Okay to continue diet as tolerated - Continue percutaneous cholecystostomy tube; monitor and record output; flush daily with 5 ccs NS. She will need to maintain this for at least 6-8 weeks - Continue IV Abx (Zosyn); follow up Cx; growing GPC   - Monitor leukocytosis; renal  function   - Monitor abdominal examination   - Pain control prn; antiemetics prn - Further management per primary service; we will follow    All of the above findings and recommendations were discussed with the patient, and the medical team, and all of patient's questions were answered to her expressed satisfaction.  -- Edison Simon, PA-C [facility] 01/17/2021, 8:15 AM [phone] M-F: 7am - 4pm

## 2021-01-17 NOTE — Plan of Care (Signed)
Patient is A/OX4. Patients blood pressure has still been soft during shift, patient is asymptomatic, and reports feeling fine. Patient did require prn dilaudid for abdominal pain and shoulder pain. She states that the pain medication is very effective. No further needs to address at this time will continue to monitor. Problem: Education: Goal: Knowledge of General Education information will improve Description: Including pain rating scale, medication(s)/side effects and non-pharmacologic comfort measures Outcome: Progressing   Problem: Pain Managment: Goal: General experience of comfort will improve Outcome: Progressing   Problem: Elimination: Goal: Will not experience complications related to bowel motility Outcome: Progressing Goal: Will not experience complications related to urinary retention Outcome: Progressing   Problem: Nutrition: Goal: Adequate nutrition will be maintained Outcome: Progressing

## 2021-01-17 NOTE — Progress Notes (Signed)
MD notified of patients Lactic Acid of 2.3. MD ordered for lactic level to be redrawn in 3hrs. AT 2230 Lactic level came back at 1.3. MD made aware of the new level. No new orders obtained. Will continue to monitor.

## 2021-01-18 DIAGNOSIS — E039 Hypothyroidism, unspecified: Secondary | ICD-10-CM | POA: Diagnosis not present

## 2021-01-18 DIAGNOSIS — I5032 Chronic diastolic (congestive) heart failure: Secondary | ICD-10-CM | POA: Diagnosis not present

## 2021-01-18 DIAGNOSIS — I10 Essential (primary) hypertension: Secondary | ICD-10-CM | POA: Diagnosis not present

## 2021-01-18 DIAGNOSIS — K810 Acute cholecystitis: Secondary | ICD-10-CM | POA: Diagnosis not present

## 2021-01-18 LAB — COMPREHENSIVE METABOLIC PANEL
ALT: 13 U/L (ref 0–44)
AST: 15 U/L (ref 15–41)
Albumin: 2.5 g/dL — ABNORMAL LOW (ref 3.5–5.0)
Alkaline Phosphatase: 92 U/L (ref 38–126)
Anion gap: 6 (ref 5–15)
BUN: 26 mg/dL — ABNORMAL HIGH (ref 8–23)
CO2: 24 mmol/L (ref 22–32)
Calcium: 8.6 mg/dL — ABNORMAL LOW (ref 8.9–10.3)
Chloride: 97 mmol/L — ABNORMAL LOW (ref 98–111)
Creatinine, Ser: 1 mg/dL (ref 0.44–1.00)
GFR, Estimated: 58 mL/min — ABNORMAL LOW (ref >60)
Glucose, Bld: 124 mg/dL — ABNORMAL HIGH (ref 70–99)
Potassium: 4.3 mmol/L (ref 3.5–5.1)
Sodium: 127 mmol/L — ABNORMAL LOW (ref 135–145)
Total Bilirubin: 1 mg/dL (ref 0.3–1.2)
Total Protein: 5.6 g/dL — ABNORMAL LOW (ref 6.5–8.1)

## 2021-01-18 LAB — CBC
HCT: 35 % — ABNORMAL LOW (ref 36.0–46.0)
Hemoglobin: 11.5 g/dL — ABNORMAL LOW (ref 12.0–15.0)
MCH: 29 pg (ref 26.0–34.0)
MCHC: 32.9 g/dL (ref 30.0–36.0)
MCV: 88.4 fL (ref 80.0–100.0)
Platelets: 122 10*3/uL — ABNORMAL LOW (ref 150–400)
RBC: 3.96 MIL/uL (ref 3.87–5.11)
RDW: 14.5 % (ref 11.5–15.5)
WBC: 22.2 10*3/uL — ABNORMAL HIGH (ref 4.0–10.5)
nRBC: 0 % (ref 0.0–0.2)

## 2021-01-18 LAB — GLUCOSE, CAPILLARY
Glucose-Capillary: 128 mg/dL — ABNORMAL HIGH (ref 70–99)
Glucose-Capillary: 136 mg/dL — ABNORMAL HIGH (ref 70–99)
Glucose-Capillary: 129 mg/dL — ABNORMAL HIGH (ref 70–99)
Glucose-Capillary: 122 mg/dL — ABNORMAL HIGH (ref 70–99)

## 2021-01-18 MED ORDER — BUMETANIDE 1 MG PO TABS
1.0000 mg | ORAL_TABLET | Freq: Two times a day (BID) | ORAL | Status: DC
  Administered 2021-01-18 – 2021-01-21 (×7): 1 mg via ORAL
  Filled 2021-01-18 (×9): qty 1

## 2021-01-18 NOTE — Progress Notes (Signed)
Notified Dr. Manuella Ghazi of bleeding at drain site. Per MD hold xarelto. No signs of distress at this time will continue to monitor.

## 2021-01-18 NOTE — Progress Notes (Addendum)
[facility] SURGICAL PROGRESS NOTE (cpt [phone])  Hospital Day(s): 3.   Interval History: Patient seen and examined, no acute events or new complaints overnight. Patient reports she continues to not feel well. Still with abdominal pain, mostly in RUQ att site of drain, unchanged from yesterday. Still endorsing nausea an decreased appetite. She denies fever, chills, emesis. CBC is pending this morning. Renal function normalized; sCr - 1.00; UO - 600 ccs + unmeasured. Continued hyponatremia to 127. LFTs and bilirubin levels are normal. Percutaneous cholecystostomy tube placed with IR yesterday (09/26); output 180 ccs and bilious. Cx from this growing [place]. She continues on Zosyn. On carb modified diet; but again not eating well  Review of Systems:  Constitutional: denies fever, chills, + decreased appetite  HEENT: denies cough or congestion  Respiratory: denies any shortness of breath  Cardiovascular: denies chest pain or palpitations  Gastrointestinal: + abdominal pain, + nausea, denied emesis Genitourinary: denies burning with urination or urinary frequency    Vital signs in last 24 hours: [min-max] current  Temp:  [98.3 F (36.8 C)-98.9 F (37.2 C)] 98.9 F (37.2 C) (09/28 0358) Pulse Rate:  [92-103] 102 (09/28 0358) Resp:  [17-20] 20 (09/28 0358) BP: (106-143)/(55-79) 143/61 (09/28 0358) SpO2:  [93 %-95 %] 94 % (09/28 0358)     Height: 5\' 3"  (160 cm) Weight: 80 kg BMI (Calculated): 31.25   Intake/Output last 2 shifts:  09/27 0701 - 09/28 0700 In: 1026.4 [I.V.:945; IV Piggyback:76.4] Out: 780 [Urine:600; Drains:180]   Physical Exam:  Constitutional: alert, cooperative and no distress  HENT: normocephalic without obvious abnormality  Eyes: PERRL, EOM's grossly intact and symmetric  Respiratory: breathing non-labored at rest  Cardiovascular: regular rate and sinus rhythm  Gastrointestinal: soft, obese, tenderness primarily over drain site in RUQ, non-distended, no  rebound/guarding. Cholecystostomy tube in the RUQ with bilious output  Musculoskeletal: no edema or wounds, motor and sensation grossly intact, NT    Labs:  CBC Latest Ref Rng & Units 01/17/2021 01/16/2021 01/15/2021  WBC 4.0 - 10.5 K/uL 21.3(H) 20.9(H) 14.2(H)  Hemoglobin 12.0 - 15.0 g/dL 11.6(L) 13.1 13.1  Hematocrit 36.0 - 46.0 % 35.6(L) 38.7 38.6  Platelets 150 - 400 K/uL 107(L) 127(L) 128(L)   CMP Latest Ref Rng & Units 01/18/2021 01/17/2021 01/16/2021  Glucose 70 - 99 mg/dL 124(H) 138(H) 153(H)  BUN 8 - 23 mg/dL 26(H) 32(H) 23  Creatinine 0.44 - 1.00 mg/dL 1.00 1.58(H) 1.50(H)  Sodium 135 - 145 mmol/L 127(L) 133(L) 134(L)  Potassium 3.5 - 5.1 mmol/L 4.3 4.3 4.5  Chloride 98 - 111 mmol/L 97(L) 102 104  CO2 22 - 32 mmol/L 24 23 25   Calcium 8.9 - 10.3 mg/dL 8.6(L) 8.3(L) 8.4(L)  Total Protein 6.5 - 8.1 g/dL 5.6(L) 5.6(L) 5.1(L)  Total Bilirubin 0.3 - 1.2 mg/dL 1.0 1.2 1.2  Alkaline Phos 38 - 126 U/L 92 73 70  AST 15 - 41 U/L 15 20 23   ALT 0 - 44 U/L 13 17 16      Imaging studies: No new pertinent imaging studies   Assessment/Plan: (ICD-10's: K81.0) 80 y.o. female with recurrent cholecystitis s/p percutaneous cholecystostomy tube placement on 91/47, complicated by numerous pertinent comorbidities.               - Okay to continue diet as tolerated - Continue percutaneous cholecystostomy tube; monitor and record output; flush daily with 5 ccs NS. She will need to maintain this for at least 6-8 weeks - Continue IV Abx (Zosyn); follow up Cx;  growing GPC              - Monitor leukocytosis; pending  - Monitor renal function; improved             - Monitor abdominal examination              - Pain control prn; antiemetics prn  - Mobilization encouraged; engage therapies if needed  - Further management per primary service  All of the above findings and recommendations were discussed with the patient, and the medical team, and all of patient's questions were answered to her expressed  satisfaction.  -- Edison Simon, PA-C [facility] 01/18/2021, 7:23 AM [phone] M-F: 7am - 4pm  I saw and evaluated the patient.  I agree with the above documentation, exam, and plan, which I have edited where appropriate. Fredirick Maudlin  11:20 AM

## 2021-01-18 NOTE — Progress Notes (Signed)
Referring Physician(s): Sidney Ace, MD Fredirick Maudlin, MD  Supervising Physician: Sandi Mariscal  Patient Status:  [facility] - In-pt  Reason for visit: Recurrent acute cholecystitis with cholelithiasis  S/p successful percutaneous cholecystostomy tube placed 9/26  Subjective: Patient states she feels about the same without any significant improvement, she admits to ongoing nausea and decreased appetite. She complains of abdominal pain and distention.    Allergies: Ropinirole, Azelastine, Bupropion, Calcium, Carbidopa-levodopa, Cefuroxime, Cephalexin, Clinoril [sulindac], Codeine, Duloxetine, Duloxetine hcl, Ezetimibe, Fenofibrate, Furosemide, Glipizide, Lorazepam, Lortab [hydrocodone-acetaminophen], Metaproterenol, Morphine and related, Nalbuphine, Naproxen, Norfloxacin, Rofecoxib, Sodium, Statins, Sulfa antibiotics, Sulfasalazine, Tramadol, Trazodone and nefazodone, Doxycycline, Iodine, Ketoprofen, Piroxicam, and Tolmetin  Medications: Prior to Admission medications   Medication Sig Start Date End Date Taking? Authorizing Provider  aspirin EC 81 MG tablet Take 81 mg by mouth at bedtime.   Yes [provider]  bumetanide (BUMEX) 0.5 MG tablet Take 1 mg by mouth 2 (two) times daily.   Yes [provider]  Chromium Picolinate 200 MCG CAPS Take 200 mcg by mouth daily.   Yes [provider]  clobetasol cream (TEMOVATE) 8.67 % Apply 1 application topically 2 (two) times daily.   Yes [provider]  diphenhydrAMINE (BENADRYL) 25 mg capsule Take 25 mg by mouth every 6 (six) hours as needed. 04/27/19  Yes [provider]  fluticasone (FLOVENT HFA) 110 MCG/ACT inhaler Inhale 2 puffs into the lungs 2 (two) times daily.   Yes [provider]  gabapentin (NEURONTIN) 100 MG capsule Take 100 mg by mouth 4 (four) times daily.   Yes [provider]  insulin glargine (LANTUS) 100 UNIT/ML injection Inject 10-20 Units into the skin See  admin instructions. Inject 20u under the skin every morning and inject 10u under the skin ever night at bedtime   Yes [provider]  insulin regular (NOVOLIN R,HUMULIN R) 250 units/2.51mL (100 units/mL) injection Inject 10 Units into the skin 3 (three) times daily before meals.   Yes [provider]  Ipratropium-Albuterol (COMBIVENT) 20-100 MCG/ACT AERS respimat Inhale 1 puff into the lungs 4 (four) times daily as needed for wheezing or shortness of breath.   Yes [provider]  lansoprazole (PREVACID) 30 MG capsule Take 30 mg by mouth 2 (two) times daily.   Yes [provider]  levalbuterol Penne Lash) 0.63 MG/3ML nebulizer solution Inhale 0.63 mg into the lungs every 6 (six) hours as needed. 04/27/19  Yes [provider]  levothyroxine (SYNTHROID, LEVOTHROID) 125 MCG tablet Take 125 mcg by mouth daily.   Yes [provider]  Magnesium 500 MG TABS Take 500 mg by mouth 4 (four) times daily.    Yes [provider]  metoprolol succinate (TOPROL-XL) 50 MG 24 hr tablet Take 50 mg by mouth daily. Take with or immediately following a meal.   Yes [provider]  mirabegron ER (MYRBETRIQ) 25 MG TB24 tablet Take 25 mg by mouth daily.   Yes [provider]  montelukast (SINGULAIR) 10 MG tablet Take 10 mg by mouth at bedtime.   Yes [provider]  Multiple Vitamins-Minerals (CENTRUM SILVER PO) Take 1 tablet by mouth daily.   Yes [provider]  Multiple Vitamins-Minerals (PRESERVISION AREDS 2 PO) Take 1 tablet by mouth 2 (two) times daily.   Yes [provider]  rivaroxaban (XARELTO) 20 MG TABS tablet Take 20 mg by mouth daily.   Yes [provider]  Ubiquinol 300 MG CAPS Take 1 capsule by mouth daily.  Yes [provider]  vitamin E 1000 UNIT capsule Take 1 capsule by mouth daily.   Yes [provider]  zinc gluconate 50 MG tablet Take 50 mg by mouth daily.   Yes [provider]     Vital Signs: BP (!) 120/51 (BP Location: Right Arm)   Pulse 100   Temp 97.7 F (36.5 C)   Resp 20   Ht 5\' 3"  (1.6 m)   Wt 176 lb 5.9 oz (80 kg)   SpO2 98%   BMI 31.24 kg/m   Physical Exam  General: Alert, appears ill Abd: Distended, Tenderness to soft palpation, RUQ drain dressing saturated in blood, good bilious output in gravity leg bag   Output by Drain (mL) 01/16/21 0701 - 01/16/21 1900 01/16/21 1901 - 01/17/21 0700 01/17/21 0701 - 01/17/21 1900 01/17/21 1901 - 01/18/21 0700 01/18/21 0701 - 01/18/21 1527  Biliary Tube Cook slip-coat 10.2 Fr. RUQ  370 180  70   Imaging: CT ABDOMEN PELVIS WO CONTRAST  Result Date: 01/15/2021 CLINICAL DATA:  Abdominal distension and right upper quadrant abdominal pain. History of cholecystostomy tube removal on 12/06/2020 EXAM: CT ABDOMEN AND PELVIS WITHOUT CONTRAST TECHNIQUE: Multidetector CT imaging of the abdomen and pelvis was performed following the standard protocol without IV contrast. COMPARISON:  CT 09/23/2020, ultrasound 01/15/2021 FINDINGS: Lower chest: Bibasilar dependent atelectasis. Heart size is normal. No pericardial effusion. Hepatobiliary: Gallbladder is moderately distended with multiple hyperdense gallstones. Gallbladder wall appears diffusely thickened with pericholecystic inflammatory fat stranding and small volume of adjacent fluid. Unenhanced liver is within normal limits. Small volume perihepatic ascites. Pancreas: Unremarkable. No pancreatic ductal dilatation or surrounding inflammatory changes. Spleen: Unremarkable. Adrenals/Urinary Tract: Slightly nodular configuration of the left adrenal gland is unchanged. Unremarkable right adrenal gland. Bilateral kidneys are within normal limits. No renal stone or hydronephrosis. Urinary bladder is unremarkable. Stomach/Bowel: Postsurgical changes again noted at the stomach. No evidence of bowel wall thickening, distention, or inflammatory changes. Vascular/Lymphatic:  Scattered aortoiliac atherosclerotic calcifications without aneurysm. No abdominopelvic lymphadenopathy. Reproductive: Uterus and bilateral adnexa are unremarkable. Other: No organized abdominopelvic fluid collection. No pneumoperitoneum. No abdominal wall hernia. Musculoskeletal: No new or acute bony findings. IMPRESSION: 1. Appearance of the gallbladder remains suspicious for acute cholecystitis. 2. Small volume perihepatic ascites. Aortic Atherosclerosis (ICD10-I70.0). Electronically Signed   By: Davina Poke D.O.   On: 01/15/2021 15:10   DG Chest 2 View  Result Date: 01/15/2021 CLINICAL DATA:  Pt arrived via [facility] with c/o upper abd pain, started a juice cleanse last Wednesday, states it was apple juice, Pt c/o N/V and abd pain. 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 Cardiac silhouette is normal in size. No mediastinal or hilar masses or evidence of adenopathy. Dependent opacity at both lung bases consistent with atelectasis. Remainder of the lungs is clear. No convincing pleural effusion and no pneumothorax. Skeletal structures are intact. IMPRESSION: No acute cardiopulmonary disease. Electronically Signed   By: Lajean Manes M.D.   On: 01/15/2021 13:37   DG Shoulder Right  Result Date: 01/15/2021 CLINICAL DATA:  Right shoulder pain EXAM: RIGHT SHOULDER - 2+ VIEW COMPARISON:  None. FINDINGS: No fracture or malalignment. Moderate AC joint degenerative change with mild glenohumeral degenerative change. Small amount of calcific tendinopathy. IMPRESSION: 1. Degenerative changes without acute osseous abnormality. 2. Calcific tendinopathy Electronically Signed   By: Madie Reno.D.  On: 01/15/2021 18:08   IR Perc Cholecystostomy  Result Date: 01/16/2021 CLINICAL DATA:  Recurrent acute cholecystitis and status post prior  percutaneous cholecystostomy tube placement on 09/26/2020. The tube was removed on 12/06/2020 after the patient insisted it be removed. She has developed recurrent acute cholecystitis and sepsis and is not a candidate currently for cholecystectomy. EXAM: PERCUTANEOUS CHOLECYSTOSTOMY COMPARISON:  CT of the abdomen and pelvis on 01/15/2021 ANESTHESIA/SEDATION: Moderate (conscious) sedation was employed during this procedure. A total of Versed 1.0 mg and Fentanyl 100 mcg was administered intravenously. Moderate Sedation Time: 25 minutes. The patient's level of consciousness and vital signs were monitored continuously by radiology nursing throughout the procedure under my direct supervision. CONTRAST:  53mL OMNIPAQUE IOHEXOL 350 MG/ML SOLN MEDICATIONS: No additional medications were administered. FLUOROSCOPY TIME:  30 seconds.  8.1 mGy. PROCEDURE: The procedure, risks, benefits, and alternatives were explained to the patient. Questions regarding the procedure were encouraged and answered. The patient understands and consents to the procedure. A time-out was performed prior to initiating the procedure. The right abdominal wall was prepped with chlorhexidine in a sterile fashion, and a sterile drape was applied covering the operative field. A sterile gown and sterile gloves were used for the procedure. Local anesthesia was provided with 1% Lidocaine. Ultrasound image documentation was performed. Fluoroscopy during the procedure and fluoro spot radiograph confirms appropriate catheter position. Ultrasound was utilized to localize the gallbladder. Under direct ultrasound guidance, an 18 gauge trocar needle was advanced into the gallbladder lumen. Aspiration was performed and a bile sample sent for culture studies. A small amount of diluted contrast material was injected. A guide wire was then advanced into the gallbladder. Percutaneous tract dilatation was then performed over a guide wire to 10-French. A 10-French pigtail  drainage catheter was then advanced into the gallbladder lumen under fluoroscopy. Catheter was formed and injected with contrast material to confirm position. The catheter was flushed and connected to a gravity drainage bag. It was secured at the skin with a Prolene retention suture and Stat-Lock device. COMPLICATIONS: None FINDINGS: After needle puncture of the gallbladder, a bile sample was aspirated and sent for culture. Bile return was dark in color with debris. The cholecystostomy tube was advanced into the gallbladder lumen and formed. It is now draining bile. This tube will be left to gravity drainage. IMPRESSION: Percutaneous cholecystostomy with placement of 10-French drainage catheter into the gallbladder lumen. This was left to gravity drainage. Electronically Signed   By: Aletta Edouard M.D.   On: 01/16/2021 16:17   US ABDOMEN LIMITED RUQ (LIVER/GB)  Result Date: 01/15/2021 CLINICAL DATA:  Right upper quadrant pain since this morning. History of a gallbladder drainage in June 2022. Drain removed in August 2022. EXAM: ULTRASOUND ABDOMEN LIMITED RIGHT UPPER QUADRANT COMPARISON:  Ultrasound, 09/26/2020. FINDINGS: Gallbladder: Gallbladder is distended. There are dependent stones and sludge. Wall thickened to between 4 and 8 mm. Small amount of pericholecystic fluid. Common bile duct: Diameter: 6 mm Liver: No focal lesion identified. Within normal limits in parenchymal echogenicity. Portal vein is patent on color Doppler imaging with normal direction of blood flow towards the liver. Other: Trace perihepatic fluid. IMPRESSION: 1. Distended gallbladder with wall thickening and dependent stones and sludge as well as a small amount of pericholecystic fluid. Findings are similar to the prior ultrasound and support acute cholecystitis in the proper clinical setting. Electronically Signed   By: Lajean Manes M.D.   On: 01/15/2021 14:13    Labs:  CBC: Recent Labs  01/15/21 1251 01/16/21 0506  01/17/21 0434 01/18/21 0757  WBC 14.2* 20.9* 21.3* 22.2*  HGB 13.1 13.1 11.6* 11.5*  HCT 38.6 38.7 35.6* 35.0*  PLT 128* 127* 107* 122*    COAGS: Recent Labs    09/25/20 0529 01/15/21 1907 01/16/21 0225  INR 1.3* 1.1  --   APTT  --  136* 36    BMP: Recent Labs    01/15/21 1251 01/16/21 0506 01/17/21 0434 01/18/21 0431  NA 132* 134* 133* 127*  K 4.2 4.5 4.3 4.3  CL 101 104 102 97*  CO2 23 25 23 24   GLUCOSE 210* 153* 138* 124*  BUN 16 23 32* 26*  CALCIUM 9.7 8.4* 8.3* 8.6*  CREATININE 0.69 1.50* 1.58* 1.00  GFRNONAA >60 35* 33* 58*    LIVER FUNCTION TESTS: Recent Labs    01/15/21 1251 01/16/21 0506 01/17/21 0434 01/18/21 0431  BILITOT 1.2 1.2 1.2 1.0  AST 31 23 20 15   ALT 23 16 17 13   ALKPHOS 99 70 73 92  PROT 6.8 5.1* 5.6* 5.6*  ALBUMIN 3.9 2.9* 2.6* 2.5*    Assessment and Plan: 80 year old female known to our service with history of acute cholecystitis with cholelithiasis s/p 46F percutaneous cholecystostomy tube placement in IR 09/26/20. The patient has been seen and followed by surgery and remains a non surgical candidate given multiple respiratory co morbidities. The patient was seen by IR in follow up on 7/14, 8/3 and 8/16 for cholangiogram and spyglass procedure consult. The patient declined spyglass procedure and wished to have her cholecystostomy tube removed despite risk of recurrence.   ED 9/26 with complaints of abdominal pain, nausea and vomiting Recurrent acute cholecystitis with cholelithiasis  S/p successful percutaneous cholecystostomy tube placed 9/26, Cx GPC, FEW ENTEROCOCCUS GALLINARUM, on IV Zosyn  Wbc remains elevated 22.2 (21.3), afebrile, patient denies any symptomatic improvement, good bilious output, H/H stable, dressing changed to clean and dry new dressing.   Would have low threshold for repeat imaging at this point to ensure no additional fluid collections are seen.   Patient will need to follow up with IR in 8 weeks for  cholecystostomy tube exchange at [facility] and to discuss spyglass procedure at that time. I have discussed this today with the patient.   Wbc remains elevated   Electronically Signed: Hedy Jacob, PA-C 01/18/2021, 3:27 PM   I spent a total of 15 Minutes at the the patient's bedside AND on the patient's hospital floor or unit, greater than 50% of which was counseling/coordinating care for acute cholecystitis.

## 2021-01-18 NOTE — Progress Notes (Signed)
PROGRESS NOTE    Gina Chambers  HGD:[id number] DOB: October 17, 1940 DOA: 01/15/2021 PCP: Kirk Ruths, MD   Brief Narrative: 80 y.o. female with medical history significant of hypertension, diabetes mellitus, COPD, asthma, GERD, hypothyroidism, OSA on CPAP, DVT on Xarelto, dCHF, vertigo, GI bleeding, COVID-19 infection, cholecystitis, who presents with abdominal pain.   Her abdominal pain started this morning, which is located in the right upper quadrant, constant, sharp, severe, radiating to the right shoulder.  Patient complains of right shoulder pain, particularly on movement.  Associated with nausea and nonbilious nonbloody vomiting for more than 10 times.  No fever or chills.  No diarrhea.  Patient denies chest pain, cough, shortness breath.  No symptoms of UTI.   Of note, pt had hx of cholecystitis in June 2022. She was treated with IV Zosyn in hospital and discharged on Augmentin.  She had a Coley ostomy tube placed, which was supposed to remain in place for 6 to 8 weeks.  The cholecystostomy was subsequently removed at patient's request.  I discussed the case with interventional radiology.  They are very familiar with the patient.  They relayed that they told the patient that if the cholecystostomy tube was discontinued the patient was very high risk for recurrence.  Of note the patient is very reluctant to have cholecystostomy tube replaced.  She desires for surgical intervention.  I explained that per our general surgeon the patient is too high risk for complications with a surgical procedure and this is why we are recommending cholecystostomy.  We may be able to do a percutaneous stone retrieval which apparently is been offered to her in the past.  After repeated discussions patient agreed to have cholecystostomy tube placed.  This was done by interventional radiology on 9/26.  Tolerated procedure well.  Remains on IV antibiotics.  Cultures with GPC.   Assessment & Plan:    Principal Problem:   Acute cholecystitis Active Problems:   Acquired hypothyroidism   Essential hypertension   Type 2 diabetes mellitus without complication (HCC)   COPD (chronic obstructive pulmonary disease) (HCC)   DVT (deep venous thrombosis) (HCC)   OSA on CPAP   Chronic diastolic CHF (congestive heart failure) (HCC)   Sepsis (HCC)   Right shoulder pain  Sepsis due to acute cholecystitis Patient meets criteria for sepsis with leukocytosis with WBC 14.2, tachycardia with heart rate in 95.  Fever T-max 100.8.  Currently hemodynamically stable.  Dr. Celine Ahr thinks patient is a poor candidate for surgery.  She recommend IR consult  Status post cholecystostomy tube and IR on 9/26 - Continue PTC tube.  Monitor and record output Will need to maintain tube for 6 to 8 weeks Continue Zosyn, follow cultures, currently with GPC's -Leukocytosis continues to worsen.  She may need repeat CT abdomen pelvis tomorrow if surgery in agreement   Acquired hypothyroidism -Synthroid   Essential hypertension -IV hydralazine as needed -Metoprolol   Type 2 diabetes mellitus without complication (HCC)  Recent A1c 6.1, well controlled.   Patient is taking Lantus 20-10 unit twice daily and Novolin at home -Sliding scale insulin -Semglee 10 units every morning, 5 units every afternoon   COPD (chronic obstructive pulmonary disease) (HCC)  Stable -Bronchodilators   DVT (deep venous thrombosis) ([place]): Patient was on Xarelto at home.  She has been off Xarelto for 4 to 5 days as she has been on a "cleanse" to deal with her cholecystitis.   -Resumed home Xarelto on 9/27   OSA - on  CPAP   Chronic diastolic CHF (congestive heart failure) ([place])  2D echo on 6/60/22 showed EF of 55-60%.  Patient has trace leg edema, no pulm edema chest x-ray.  CHF seem to be compensated. Restart Bumex on 9/28   Right shoulder pain Possibly due to right upper quadrant abdominal pain radiating to the shoulder -X-ray  negative for fracture -As needed Tylenol  Hyponatremia Likely due to volume overload.  I will stop IV fluids and restart Bumex Net IO Since Admission: 1,485.8 mL [01/18/21 1443]   Goals of care: Overall very poor prognosis.  Will consult palliative care Daughter in agreement with transition to hospice and wants her to be DNR as that is what patient's wishes are  DVT prophylaxis: Xarelto Code Status: DNR Family Communication: Daughter Wells Guiles [phone] on 9/28 Disposition Plan: Status is: Inpatient  Remains inpatient appropriate because:Inpatient level of care appropriate due to severity of illness  Dispo: The patient is from: Home              Anticipated d/c is to: Home with hospice?              Patient currently is not medically stable to d/c.   Difficult to place patient No   Level of care: Med-Surg  Consultants:  IR General surgery  Procedures:  IR guided PTC  Antimicrobials:  Zosyn   Subjective: Does not feel well.  Not able to eat much and requests to keep her comfortable  Objective: Vitals:   01/17/21 2008 01/18/21 0358 01/18/21 0805 01/18/21 1121  BP: 136/64 (!) 143/61 (!) 120/51   Pulse: (!) 103 (!) 102 100   Resp: 18 20    Temp:  98.9 F (37.2 C) 97.7 F (36.5 C)   TempSrc:  Oral    SpO2: 94% 94% 91% 98%  Weight:      Height:        Intake/Output Summary (Last 24 hours) at 01/18/2021 1438 Last data filed at 01/18/2021 1413 Gross per 24 hour  Intake 813.33 ml  Output 1660 ml  Net -846.67 ml   Filed Weights   01/15/21 1250 01/16/21 0452  Weight: 79.8 kg 80 kg    Examination:  General exam: No acute distress.  Looks chronically ill Respiratory system: Clear to auscultation. Respiratory effort normal. Cardiovascular system: S1-S2, RRR, no murmurs, no pedal edema Gastrointestinal system: Obese, distended, tender to palpation right upper quadrant, cholecystostomy tube in place Central nervous system: Alert and oriented. No focal  neurological deficits. Extremities: Symmetric 5 x 5 power. Skin: No rashes, lesions or ulcers Psychiatry: Judgement and insight appear normal. Mood & affect appropriate.     Data Reviewed: I have personally reviewed following labs and imaging studies  CBC: Recent Labs  Lab 01/15/21 1251 01/16/21 0506 01/17/21 0434 01/18/21 0757  WBC 14.2* 20.9* 21.3* 22.2*  HGB 13.1 13.1 11.6* 11.5*  HCT 38.6 38.7 35.6* 35.0*  MCV 87.1 86.4 88.6 88.4  PLT 128* 127* 107* 093*   Basic Metabolic Panel: Recent Labs  Lab 01/15/21 1251 01/16/21 0506 01/17/21 0434 01/18/21 0431  NA 132* 134* 133* 127*  K 4.2 4.5 4.3 4.3  CL 101 104 102 97*  CO2 _0 GLUCOSE 210* 153* 138* 124*  BUN 16 23 32* 26*  CREATININE 0.69 1.50* 1.58* 1.00  CALCIUM 9.7 8.4* 8.3* 8.6*   GFR: Estimated Creatinine Clearance: 46.4 mL/min (by C-G formula based on SCr of 1 mg/dL). Liver Function Tests: Recent Labs  Lab 01/15/21 1251  01/16/21 0506 01/17/21 0434 01/18/21 0431  AST _0 ALT _1 ALKPHOS 99 70 73 92  BILITOT 1.2 1.2 1.2 1.0  PROT 6.8 5.1* 5.6* 5.6*  ALBUMIN 3.9 2.9* 2.6* 2.5*   Recent Labs  Lab 01/15/21 1251  LIPASE 28   No results for input(s): AMMONIA in the last 168 hours. Coagulation Profile: Recent Labs  Lab 01/15/21 1907  INR 1.1   Cardiac Enzymes: No results for input(s): CKTOTAL, CKMB, CKMBINDEX, TROPONINI in the last 168 hours. BNP (last 3 results) No results for input(s): PROBNP in the last 8760 hours. HbA1C: No results for input(s): HGBA1C in the last 72 hours. CBG: Recent Labs  Lab 01/17/21 1153 01/17/21 1650 01/17/21 2140 01/18/21 0819 01/18/21 1139  GLUCAP 117* 119* 136* 128* 136*   Lipid Profile: No results for input(s): CHOL, HDL, LDLCALC, TRIG, CHOLHDL, LDLDIRECT in the last 72 hours. Thyroid Function Tests: No results for input(s): TSH, T4TOTAL, FREET4, T3FREE, THYROIDAB in the last 72 hours. Anemia Panel: No results for input(s):  VITAMINB12, FOLATE, FERRITIN, TIBC, IRON, RETICCTPCT in the last 72 hours. Sepsis Labs: Recent Labs  Lab 01/15/21 1907 01/15/21 2131  PROCALCITON 0.18  --   LATICACIDVEN 2.3* 1.3    Recent Results (from the past 240 hour(s))  Resp Panel by RT-PCR (Flu A&B, Covid) Nasopharyngeal Swab     Status: None   Collection Time: 01/15/21  2:41 PM   Specimen: Nasopharyngeal Swab; Nasopharyngeal(NP) swabs in vial transport medium  Result Value Ref Range Status   SARS Coronavirus 2 by RT PCR NEGATIVE NEGATIVE Final    Comment: (NOTE) SARS-CoV-2 target nucleic acids are NOT DETECTED.  The SARS-CoV-2 RNA is generally detectable in upper respiratory specimens during the acute phase of infection. The lowest concentration of SARS-CoV-2 viral copies this assay can detect is 138 copies/mL. A negative result does not preclude SARS-Cov-2 infection and should not be used as the sole basis for treatment or other patient management decisions. A negative result may occur with  improper specimen collection/handling, submission of specimen other than nasopharyngeal swab, presence of viral mutation(s) within the areas targeted by this assay, and inadequate number of viral copies(<138 copies/mL). A negative result must be combined with clinical observations, patient history, and epidemiological information. The expected result is Negative.  Fact Sheet for Patients:  EntrepreneurPulse.com.au  Fact Sheet for Healthcare Providers:  IncredibleEmployment.be  This test is no t yet approved or cleared by the [place] FDA and  has been authorized for detection and/or diagnosis of SARS-CoV-2 by FDA under an Emergency Use Authorization (EUA). This EUA will remain  in effect (meaning this test can be used) for the duration of the COVID-19 declaration under Section 564(b)(1) of the Act, 21 U.S.C.section 360bbb-3(b)(1), unless the authorization is terminated  or revoked  sooner.       Influenza A by PCR NEGATIVE NEGATIVE Final   Influenza B by PCR NEGATIVE NEGATIVE Final    Comment: (NOTE) The Xpert Xpress SARS-CoV-2/FLU/RSV plus assay is intended as an aid in the diagnosis of influenza from Nasopharyngeal swab specimens and should not be used as a sole basis for treatment. Nasal washings and aspirates are unacceptable for Xpert Xpress SARS-CoV-2/FLU/RSV testing.  Fact Sheet for Patients: EntrepreneurPulse.com.au  Fact Sheet for Healthcare Providers: IncredibleEmployment.be  This test is not yet approved or cleared by the [place] FDA and has been authorized for detection and/or diagnosis of SARS-CoV-2 by FDA under an Emergency Use Authorization (EUA). This  EUA will remain in effect (meaning this test can be used) for the duration of the COVID-19 declaration under Section 564(b)(1) of the Act, 21 U.S.C. section 360bbb-3(b)(1), unless the authorization is terminated or revoked.  Performed at [facility], [address] [place], [place] [address]   Culture, blood (x 2)     Status: None (Preliminary result)   Collection Time: 01/15/21  7:08 PM   Specimen: BLOOD  Result Value Ref Range Status   Specimen Description BLOOD BLOOD LEFT HAND  Final   Special Requests   Final    BOTTLES DRAWN AEROBIC ONLY Blood Culture adequate volume   Culture   Final    NO GROWTH 3 DAYS Performed at [facility], [address], [place], [place] [address]    Report Status PENDING  Incomplete  Culture, blood (x 2)     Status: None (Preliminary result)   Collection Time: 01/15/21  8:05 PM   Specimen: BLOOD  Result Value Ref Range Status   Specimen Description BLOOD BLOOD LEFT HAND  Final   Special Requests   Final    BOTTLES DRAWN AEROBIC ONLY Blood Culture adequate volume   Culture   Final    NO GROWTH 3 DAYS Performed at [facility], [address] [place], [place] [address]     Report Status PENDING  Incomplete  Aerobic/Anaerobic Culture w Gram Stain (surgical/deep wound)     Status: None (Preliminary result)   Collection Time: 01/16/21  3:40 PM   Specimen: Gallbladder; Bile  Result Value Ref Range Status   Specimen Description   Final    GALL BLADDER Performed at [facility], [address] [place], [place] [address]    Special Requests   Final    Normal Performed at [facility], [address] [place], [place] [address]    Gram Stain   Final    NO SQUAMOUS EPITHELIAL CELLS SEEN FEW WBC SEEN FEW GRAM POSITIVE COCCI Performed at [facility], [place] [address] [place],  [address]    Culture   Final    FEW ENTEROCOCCUS GALLINARUM SUSCEPTIBILITIES TO FOLLOW NO ANAEROBES ISOLATED; CULTURE IN PROGRESS FOR 5 DAYS    Report Status PENDING  Incomplete         Radiology Studies: IR Perc Cholecystostomy  Result Date: 01/16/2021 CLINICAL DATA:  Recurrent acute cholecystitis and status post prior percutaneous cholecystostomy tube placement on 09/26/2020. The tube was removed on 12/06/2020 after the patient insisted it be removed. She has developed recurrent acute cholecystitis and sepsis and is not a candidate currently for cholecystectomy. EXAM: PERCUTANEOUS CHOLECYSTOSTOMY COMPARISON:  CT of the abdomen and pelvis on 01/15/2021 ANESTHESIA/SEDATION: Moderate (conscious) sedation was employed during this procedure. A total of Versed 1.0 mg and Fentanyl 100 mcg was administered intravenously. Moderate Sedation Time: 25 minutes. The patient's level of consciousness and vital signs were monitored continuously by radiology nursing throughout the procedure under my direct supervision. CONTRAST:  79m OMNIPAQUE IOHEXOL 350 MG/ML SOLN MEDICATIONS: No additional medications were administered. FLUOROSCOPY TIME:  30 seconds.  8.1 mGy. PROCEDURE: The procedure, risks, benefits, and alternatives were explained to the patient. Questions regarding  the procedure were encouraged and answered. The patient understands and consents to the procedure. A time-out was performed prior to initiating the procedure. The right abdominal wall was prepped with chlorhexidine in a sterile fashion, and a sterile drape was applied covering the operative field. A sterile gown and sterile gloves were used for  the procedure. Local anesthesia was provided with 1% Lidocaine. Ultrasound image documentation was performed. Fluoroscopy during the procedure and fluoro spot radiograph confirms appropriate catheter position. Ultrasound was utilized to localize the gallbladder. Under direct ultrasound guidance, an 18 gauge trocar needle was advanced into the gallbladder lumen. Aspiration was performed and a bile sample sent for culture studies. A small amount of diluted contrast material was injected. A guide wire was then advanced into the gallbladder. Percutaneous tract dilatation was then performed over a guide wire to 10-French. A 10-French pigtail drainage catheter was then advanced into the gallbladder lumen under fluoroscopy. Catheter was formed and injected with contrast material to confirm position. The catheter was flushed and connected to a gravity drainage bag. It was secured at the skin with a Prolene retention suture and Stat-Lock device. COMPLICATIONS: None FINDINGS: After needle puncture of the gallbladder, a bile sample was aspirated and sent for culture. Bile return was dark in color with debris. The cholecystostomy tube was advanced into the gallbladder lumen and formed. It is now draining bile. This tube will be left to gravity drainage. IMPRESSION: Percutaneous cholecystostomy with placement of 10-French drainage catheter into the gallbladder lumen. This was left to gravity drainage. Electronically Signed   By: Aletta Edouard M.D.   On: 01/16/2021 16:17        Scheduled Meds:  aspirin EC  81 mg Oral QHS   bumetanide  1 mg Oral BID   fluticasone  2 puff  Inhalation BID   gabapentin  100 mg Oral QID   insulin aspart  0-5 Units Subcutaneous QHS   insulin aspart  0-9 Units Subcutaneous TID WC   insulin glargine-yfgn  10 Units Subcutaneous Daily   And   insulin glargine-yfgn  5 Units Subcutaneous QHS   levothyroxine  125 mcg Oral Q0600   metoprolol succinate  50 mg Oral Daily   montelukast  10 mg Oral QHS   multivitamin with minerals   Oral Daily   pantoprazole  20 mg Oral Daily   rivaroxaban  20 mg Oral Q supper   sodium chloride flush  5 mL Intracatheter Q8H   Continuous Infusions:  piperacillin-tazobactam (ZOSYN)  IV 3.375 g (01/18/21 0546)     LOS: 3 days    Time spent: 35 minutes    Max Sane, MD [facility] Pager [phone]  If 7PM-7AM, please contact night-coverage 01/18/2021, 2:38 PM

## 2021-01-19 ENCOUNTER — Inpatient Hospital Stay: Payer: Medicare Other

## 2021-01-19 DIAGNOSIS — J449 Chronic obstructive pulmonary disease, unspecified: Secondary | ICD-10-CM | POA: Diagnosis not present

## 2021-01-19 DIAGNOSIS — I4892 Unspecified atrial flutter: Secondary | ICD-10-CM

## 2021-01-19 DIAGNOSIS — I5032 Chronic diastolic (congestive) heart failure: Secondary | ICD-10-CM | POA: Diagnosis not present

## 2021-01-19 DIAGNOSIS — K810 Acute cholecystitis: Secondary | ICD-10-CM

## 2021-01-19 DIAGNOSIS — Z515 Encounter for palliative care: Secondary | ICD-10-CM | POA: Diagnosis not present

## 2021-01-19 DIAGNOSIS — Z7189 Other specified counseling: Secondary | ICD-10-CM | POA: Diagnosis not present

## 2021-01-19 DIAGNOSIS — E039 Hypothyroidism, unspecified: Secondary | ICD-10-CM | POA: Diagnosis not present

## 2021-01-19 LAB — CBC WITH DIFFERENTIAL/PLATELET
Abs Immature Granulocytes: 0.33 10*3/uL — ABNORMAL HIGH (ref 0.00–0.07)
Basophils Absolute: 0.1 10*3/uL (ref 0.0–0.1)
Basophils Relative: 0 %
Eosinophils Absolute: 0.1 10*3/uL (ref 0.0–0.5)
Eosinophils Relative: 1 %
HCT: 33.5 % — ABNORMAL LOW (ref 36.0–46.0)
Hemoglobin: 11.8 g/dL — ABNORMAL LOW (ref 12.0–15.0)
Immature Granulocytes: 2 %
Lymphocytes Relative: 30 %
Lymphs Abs: 6.8 10*3/uL — ABNORMAL HIGH (ref 0.7–4.0)
MCH: 30.5 pg (ref 26.0–34.0)
MCHC: 35.2 g/dL (ref 30.0–36.0)
MCV: 86.6 fL (ref 80.0–100.0)
Monocytes Absolute: 2 10*3/uL — ABNORMAL HIGH (ref 0.1–1.0)
Monocytes Relative: 9 %
Neutro Abs: 13 10*3/uL — ABNORMAL HIGH (ref 1.7–7.7)
Neutrophils Relative %: 58 %
Platelets: 146 10*3/uL — ABNORMAL LOW (ref 150–400)
RBC: 3.87 MIL/uL (ref 3.87–5.11)
RDW: 14.6 % (ref 11.5–15.5)
Smear Review: NORMAL
WBC: 22.3 10*3/uL — ABNORMAL HIGH (ref 4.0–10.5)
nRBC: 0 % (ref 0.0–0.2)

## 2021-01-19 LAB — COMPREHENSIVE METABOLIC PANEL
ALT: 12 U/L (ref 0–44)
ALT: 10 U/L (ref 0–44)
AST: 14 U/L — ABNORMAL LOW (ref 15–41)
AST: 24 U/L (ref 15–41)
Albumin: 2.3 g/dL — ABNORMAL LOW (ref 3.5–5.0)
Albumin: 2.3 g/dL — ABNORMAL LOW (ref 3.5–5.0)
Alkaline Phosphatase: 76 U/L (ref 38–126)
Alkaline Phosphatase: 80 U/L (ref 38–126)
Anion gap: 9 (ref 5–15)
Anion gap: 9 (ref 5–15)
BUN: 20 mg/dL (ref 8–23)
BUN: 22 mg/dL (ref 8–23)
CO2: 25 mmol/L (ref 22–32)
CO2: 26 mmol/L (ref 22–32)
Calcium: 8.5 mg/dL — ABNORMAL LOW (ref 8.9–10.3)
Calcium: 8.1 mg/dL — ABNORMAL LOW (ref 8.9–10.3)
Chloride: 98 mmol/L (ref 98–111)
Chloride: 93 mmol/L — ABNORMAL LOW (ref 98–111)
Creatinine, Ser: 0.8 mg/dL (ref 0.44–1.00)
Creatinine, Ser: 0.86 mg/dL (ref 0.44–1.00)
GFR, Estimated: >60 mL/min (ref >60)
GFR, Estimated: >60 mL/min (ref >60)
Glucose, Bld: 124 mg/dL — ABNORMAL HIGH (ref 70–99)
Glucose, Bld: 133 mg/dL — ABNORMAL HIGH (ref 70–99)
Potassium: 3.7 mmol/L (ref 3.5–5.1)
Potassium: 4.1 mmol/L (ref 3.5–5.1)
Sodium: 132 mmol/L — ABNORMAL LOW (ref 135–145)
Sodium: 128 mmol/L — ABNORMAL LOW (ref 135–145)
Total Bilirubin: 1.2 mg/dL (ref 0.3–1.2)
Total Bilirubin: 1.7 mg/dL — ABNORMAL HIGH (ref 0.3–1.2)
Total Protein: 5.5 g/dL — ABNORMAL LOW (ref 6.5–8.1)
Total Protein: 5.6 g/dL — ABNORMAL LOW (ref 6.5–8.1)

## 2021-01-19 LAB — TSH: TSH: 2.426 u[IU]/mL (ref 0.350–4.500)

## 2021-01-19 LAB — CBC
HCT: 37.4 % (ref 36.0–46.0)
Hemoglobin: 12 g/dL (ref 12.0–15.0)
MCH: 28.6 pg (ref 26.0–34.0)
MCHC: 32.1 g/dL (ref 30.0–36.0)
MCV: 89.3 fL (ref 80.0–100.0)
Platelets: 140 10*3/uL — ABNORMAL LOW (ref 150–400)
RBC: 4.19 MIL/uL (ref 3.87–5.11)
RDW: 14.4 % (ref 11.5–15.5)
WBC: 21.2 10*3/uL — ABNORMAL HIGH (ref 4.0–10.5)
nRBC: 0 % (ref 0.0–0.2)

## 2021-01-19 LAB — GLUCOSE, CAPILLARY
Glucose-Capillary: 126 mg/dL — ABNORMAL HIGH (ref 70–99)
Glucose-Capillary: 124 mg/dL — ABNORMAL HIGH (ref 70–99)
Glucose-Capillary: 140 mg/dL — ABNORMAL HIGH (ref 70–99)
Glucose-Capillary: 121 mg/dL — ABNORMAL HIGH (ref 70–99)

## 2021-01-19 LAB — D-DIMER, QUANTITATIVE: D-Dimer, Quant: 4.02 ug/mL-FEU — ABNORMAL HIGH (ref 0.00–0.50)

## 2021-01-19 LAB — MAGNESIUM: Magnesium: 1.7 mg/dL (ref 1.7–2.4)

## 2021-01-19 MED ORDER — DILTIAZEM HCL 25 MG/5ML IV SOLN
22.0000 mg | Freq: Once | INTRAVENOUS | Status: AC
  Administered 2021-01-19: 22 mg via INTRAVENOUS
  Filled 2021-01-19: qty 5

## 2021-01-19 MED ORDER — METOPROLOL TARTRATE 5 MG/5ML IV SOLN
5.0000 mg | Freq: Once | INTRAVENOUS | Status: AC
  Administered 2021-01-19: 5 mg via INTRAVENOUS
  Filled 2021-01-19: qty 5

## 2021-01-19 MED ORDER — MAGNESIUM SULFATE 2 GM/50ML IV SOLN
2.0000 g | Freq: Once | INTRAVENOUS | Status: AC
  Administered 2021-01-20: 2 g via INTRAVENOUS
  Filled 2021-01-19: qty 50

## 2021-01-19 MED ORDER — METOPROLOL TARTRATE 5 MG/5ML IV SOLN: 10.0000 mg | Freq: Once | INTRAVENOUS | Status: DC

## 2021-01-19 MED ORDER — IPRATROPIUM-ALBUTEROL 0.5-2.5 (3) MG/3ML IN SOLN
3.0000 mL | Freq: Four times a day (QID) | RESPIRATORY_TRACT | Status: DC
  Administered 2021-01-19 – 2021-01-21 (×5): 3 mL via RESPIRATORY_TRACT
  Filled 2021-01-19 (×6): qty 3

## 2021-01-19 NOTE — Consult Note (Signed)
Consultation Note Date: 01/19/2021   Patient Name: Gina Chambers  DOB: 24-Aug-1940  MRN: [id number]  Age / Sex: 80 y.o., female  PCP: Kirk Ruths, MD Referring Physician: Max Sane, MD  Reason for Consultation: Establishing goals of care  HPI/Patient Profile: 80 y.o. female  with past medical history of diastolic heart failure, hypertension, vertigo, diabetes, COPD, asthma, hypothyroidism, OSA on CPAP, DVT on Xarelto, GIB, COVID infection, GERD, cholecystitis admitted on 01/15/2021 with abd pain with recurrent cholecystitis with sepsis. IR placed perc drain 01/16/21 with plans for Spyglass percutaneous stone retrieval consultation in ~8 weeks.    Clinical Assessment and Goals of Care: I met today with Gina Chambers. She is very frustrated this morning and has many complaints about her care but I think this is complicated by her frustrations of ongoing illness. She is so worked up she cannot even tell me about her pain, nausea, and how she is feeling.   I met privately with her daughter, Gina Chambers, and son-in-law. Gina Chambers lives with them. Gina Chambers shares that this is not really like her mother to be so negative and frustrated and complaining like this. We had a good conversation about the limitations of medicine since she is not a candidate for surgery. Gina Chambers is torn about her mother's wishes on how to move forward as she was expressing that she was done yesterday but today seems more alert and willing to continue with interventions today. We discussed when to switch gears and consider hospice options either when Gina Chambers shares that she no longer desires aggressive care and desires more comfort and symptom management or if her condition continues to decline despite our best treatment and interventions. We discussed hospice at home vs hospice facility in these scenarios. Gina Chambers shares that she know her mother would  not want feeding tube and would not want to die in the hospital. She does tell me that her mother was able to ambulate (sometimes needing walker) but able to perform her own ADLs and safe to even stay home alone while family away on vacation.   We returned to speak together with Gina Chambers to better determine her wishes. Gina Chambers confirms DNR. She expresses desire to continue treatment, willingness to keep perc drain for the rest of her life, willingness to pursue Spyglass procedure (concerned about cost and insurance coverage). She shares that she is hopeful that she can recover and have more time and improve to return home and be functional as she was before. She feels that she does have improved pain and nausea today and hoping she can tolerate more po intake. She does share that she would not find a life where she is bed-bound and unable to get up and function on some level on her own - this would not be an acceptable quality of life to her. She expresses faith in God and that she does not feel that God would want her to just give up. I explained that if she gets to a place where she feels  the treatments are not helping her she just needs to tell us so we can discuss a different plan. She verbalizes understanding.   All questions/concerns addressed. Emotional support provided.   Primary Decision Maker PATIENT with support/assistance of her daughter (only child)    SUMMARY OF RECOMMENDATIONS   - DNR - Continue with current treatment/interventions  Code Status/Advance Care Planning: DNR   Symptom Management:  Per attending and surgery.   Palliative Prophylaxis:  Bowel Regimen, Delirium Protocol, Frequent Pain Assessment, Oral Care, and Turn Reposition  Additional Recommendations (Limitations, Scope, Preferences): No Artificial Feeding  Psycho-social/Spiritual:  Desire for further Chaplaincy support:no  Prognosis:  Prognosis guarded. Time for outcomes.   Discharge Planning: To Be  Determined      Primary Diagnoses: Present on Admission:  Acute cholecystitis  Essential hypertension  Acquired hypothyroidism  COPD (chronic obstructive pulmonary disease) (HCC)  DVT (deep venous thrombosis) (HCC)  Chronic diastolic CHF (congestive heart failure) (HCC)  Sepsis (HCC)  Right shoulder pain   I have reviewed the medical record, interviewed the patient and family, and examined the patient. The following aspects are pertinent.  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 last one approx 2013   Family history of adverse reaction to anesthesia    Mother - temporary paralysis of 1 side   GERD (gastroesophageal reflux disease)    Hypertension    Neuropathy    feet   PONV (postoperative nausea and vomiting)    Pt reports violent vomiting with ANY pain meds given with anesthesia.   Sleep apnea    CPAP   Thyroid disease    Vertigo    daily   Social History   Socioeconomic History   Marital status: Single    Spouse name: Not on file   Number of children: Not on file   Years of education: Not on file   Highest education level: Not on file  Occupational History   Not on file  Tobacco Use   Smoking status: Never   Smokeless tobacco: Never  Vaping Use   Vaping Use: Never used  Substance and Sexual Activity   Alcohol use: No   Drug use: No   Sexual activity: Not on file  Other Topics Concern   Not on file  Social History Narrative   Not on file   Social Determinants of Health   Financial Resource Strain: Not on file  Food Insecurity: Not on file  Transportation Needs: Not on file  Physical Activity: Not on file  Stress: Not on file  Social Connections: Not on file   Family History  Problem Relation Age of Onset   Breast cancer Mother        75's   Breast  cancer Maternal Grandmother    Breast cancer Paternal Grandmother    Scheduled Meds:  aspirin EC  81 mg Oral QHS   bumetanide  1 mg Oral BID   fluticasone  2 puff Inhalation BID   gabapentin  100 mg Oral QID   insulin aspart  0-5 Units Subcutaneous QHS   insulin aspart  0-9 Units Subcutaneous TID WC   insulin glargine-yfgn  10 Units Subcutaneous Daily   And   insulin glargine-yfgn  5 Units Subcutaneous QHS   levothyroxine  125  mcg Oral Q0600   metoprolol succinate  50 mg Oral Daily   montelukast  10 mg Oral QHS   multivitamin with minerals   Oral Daily   pantoprazole  20 mg Oral Daily   rivaroxaban  20 mg Oral Q supper   sodium chloride flush  5 mL Intracatheter Q8H   Continuous Infusions:  piperacillin-tazobactam (ZOSYN)  IV 3.375 g (01/19/21 0542)   PRN Meds:.acetaminophen, dextromethorphan-guaiFENesin, hydrALAZINE, HYDROmorphone (DILAUDID) injection, ipratropium-albuterol, levalbuterol, midazolam, ondansetron (ZOFRAN) IV, ondansetron (ZOFRAN) IV Allergies  Allergen Reactions   Ropinirole Nausea And Vomiting   Azelastine Other (See Comments)    Closes airways  Closes airways     Bupropion     GI issues    Calcium     Chest pain    Carbidopa-Levodopa Other (See Comments)    Patient reports chest pain, back pain and left side pain   Cefuroxime     Swelling    Cephalexin    Clinoril [Sulindac]     GI   Codeine Hives   Duloxetine     Bleeding    Duloxetine Hcl Other (See Comments)    Bleeding   Ezetimibe     Joint pain    Fenofibrate     Leg cramps   Furosemide     Fluid retention    Glipizide     Bloating    Lorazepam     SI   Lortab [Hydrocodone-Acetaminophen] Hives   Metaproterenol     Palpitations    Morphine And Related    Nalbuphine     Rapid heart rate  flushing    Naproxen     Numbness    Norfloxacin     Urinary retention    Rofecoxib    Sodium    Statins     Muscle pain    Sulfa Antibiotics     Unknown    Sulfasalazine Other  (See Comments)    Unknown   Tramadol    Trazodone And Nefazodone    Doxycycline Rash   Iodine Rash   Ketoprofen Rash   Piroxicam Rash   Tolmetin Rash   Review of Systems  Constitutional:  Positive for activity change, appetite change and fatigue.  Gastrointestinal:  Positive for abdominal pain and nausea.   Physical Exam Vitals and nursing note reviewed.  Constitutional:      General: She is not in acute distress.    Appearance: She is morbidly obese. She is ill-appearing.  Pulmonary:     Effort: No tachypnea, accessory muscle usage or respiratory distress.  Abdominal:     Palpations: Abdomen is soft.     Tenderness: There is abdominal tenderness.  Neurological:     Mental Status: She is alert and oriented to person, place, and time.     Comments: She does endorse some visual hallucinations but also has awareness that these are hallucinations. Some underlying confusion/delirium.     Vital Signs: BP (!) 111/54   Pulse (!) 107   Temp 98.5 F (36.9 C) (Oral)   Resp 18   Ht _0  (1.6 m)   Wt 88.7 kg   SpO2 98%   BMI 34.64 kg/m  Pain Scale: 0-10 POSS *See Group Information*: 1-Acceptable,Awake and alert Pain Score: Asleep   SpO2: SpO2: 98 % O2 Device:SpO2: 98 % O2 Flow Rate: .O2 Flow Rate (L/min): 2 L/min  IO: Intake/output summary:  Intake/Output Summary (Last 24 hours) at 01/19/2021 0831 Last data filed at 01/19/2021 0542 Gross per 24  hour  Intake 5 ml  Output 4375 ml  Net -4370 ml    LBM: Last BM Date: 01/16/21 Baseline Weight: Weight: 79.8 kg Most recent weight: Weight: 88.7 kg     Palliative Assessment/Data:     Time In: 1030 Time Out: 1150 Time Total: 80 min Greater than 50%  of this time was spent counseling and coordinating care related to the above assessment and plan.  Signed by: Vinie Sill, NP Palliative Medicine Team Pager # [phone] (M-F 8a-5p) Team Phone # [phone] (Nights/Weekends)

## 2021-01-19 NOTE — Assessment & Plan Note (Signed)
Continue Synthroid °

## 2021-01-19 NOTE — Progress Notes (Signed)
  Progress Note    Gina Chambers   YWV:[id number]  DOB: [birth year]  DOA: 01/15/2021     4 Date of Service: 01/19/2021     Subjective:  Increasing problem with Abdominal distention.  Patient is getting frustrated of being here.  Requesting schedule nebulizer and hopeful that she will get better  Hospital Problems * Acute cholecystitis S/p IR guided cholecystostomy tube on 9/26.  Tube will need to stay in place for 6 to 8 weeks per surgery.  Continue IV antibiotics.  CT abdomen/pelvis does not show any worsening pathology other than ascites and pleural effusion   Sepsis ([place]) Due to acute cholecystitis.  Patient cannot get more IV fluid as she is getting more fluid overloaded with findings of ascites and pleural effusion.  Bumex restarted yesterday  Chronic diastolic CHF (congestive heart failure) ([place]) Bumex resumed yesterday.  2D echo in June 2022 showed EF of 55 to 60%.  OSA on CPAP Continue use of CPAP while in the hospital  DVT (deep venous thrombosis) (HCC) On Xarelto  COPD (chronic obstructive pulmonary disease) (HCC) Stable.  Continue Mucinex, Flovent, DuoNeb and Singulair.  Patient is on room air  Hyponatremia Improving with starting of Bumex.  This was likely due to fluid overload.  Type 2 diabetes mellitus without complication (HCC) Hemoglobin A1c of 6.1.  Continue sliding scale and insulin Semglee 10 units subcu daily and 5 units subcu nightly  Essential hypertension Well-controlled on metoprolol  Acquired hypothyroidism Continue Synthroid  Generalized weakness Multifactorial.  Leukocytosis Due to sepsis.  Monitor while on antibiotics     Objective Vital signs were reviewed and unremarkable.  Vitals:   01/19/21 0440 01/19/21 0551 01/19/21 0826 01/19/21 1315  BP: 119/71  (!) 111/54   Pulse: 85  (!) 107   Resp: 18     Temp: 98.5 F (36.9 C)  98.5 F (36.9 C)   TempSrc:   Oral   SpO2: 96%  98% 98%  Weight:  88.7 kg    Height:       88.7  kg  Exam Physical Exam  80 year old female looking chronically ill and critically sick Neck supple, JVD present Lungs decreased breath sounds in the bases, rales present, no rhonchi or wheezing Cardiovascular S1-S2 normal, no murmur rales or gallop Abdomen obese, distended, tender to palpation over right upper quadrant area.  Cholecystostomy tube in place and draining Skin no rash or lesion Neuro alert and oriented, nonfocal Psychiatry patient is getting frustrated being here  Labs / Other Information My review of labs, imaging, notes and other tests is significant for Low albumin and protein level.  Sodium of 132 and leukocytosis     Time spent: 25 minutes [facility] 01/19/2021, 1:39 PM

## 2021-01-19 NOTE — Progress Notes (Addendum)
[facility] SURGICAL PROGRESS NOTE (cpt [id number])  Hospital Day(s): 4.  Interval History: Patient seen and examined, no acute events or new complaints overnight. Patient reports she continues to have generalized abdominal discomfort, worse at drain site. Still with nausea and distension and she feels as if she Is burping a lot. Continues to have relatively unchanged leukocytosis to 21.2K. Renal function remains normal; sCr - 0.80; UO - 4.0L. Hyponatremia improving, now 132. LFTs remain in normal ranges. Percutaneous cholecystostomy tube placed with IR on 09/26; output 325 ccs and bilious. Cx from this growing enterococcus gallinarum; susceptibilities pending. She continues on Zosyn. On carb modified diet; but again not eating well  Review of Systems:  Constitutional: denies fever, chills, + decreased appetite  HEENT: denies cough or congestion  Respiratory: denies any shortness of breath  Cardiovascular: denies chest pain or palpitations  Gastrointestinal: + abdominal pain, + nausea, denied emesis Genitourinary: denies burning with urination or urinary frequency    Vital signs in last 24 hours: [min-max] current  Temp:  [97.7 F (36.5 C)-98.5 F (36.9 C)] 98.5 F (36.9 C) (09/29 0440) Pulse Rate:  [82-100] 85 (09/29 0440) Resp:  [18] 18 (09/29 0440) BP: (119-120)/(51-71) 119/71 (09/29 0440) SpO2:  [91 %-100 %] 96 % (09/29 0440) Weight:  [88.7 kg] 88.7 kg (09/29 0551)     Height: 5\' 3"  (160 cm) Weight: 88.7 kg BMI (Calculated): 34.65   Intake/Output last 2 shifts:  09/28 0701 - 09/29 0700 In: 5  Out: 2505 [Urine:4050; Drains:325]   Physical Exam:  Constitutional: alert, cooperative and no distress  HENT: normocephalic without obvious abnormality  Eyes: PERRL, EOM's grossly intact and symmetric  Respiratory: breathing non-labored at rest  Cardiovascular: regular rate and sinus rhythm  Gastrointestinal: soft, obese, tenderness primarily over drain site in RUQ, she is  distended and tympanic, no rebound/guarding. Cholecystostomy tube in the RUQ with bilious output, some serosanguinous drainage around the tube Musculoskeletal: no edema or wounds, motor and sensation grossly intact, NT   Labs:  CBC Latest Ref Rng & Units 01/19/2021 01/18/2021 01/17/2021  WBC 4.0 - 10.5 K/uL 21.2(H) 22.2(H) 21.3(H)  Hemoglobin 12.0 - 15.0 g/dL 12.0 11.5(L) 11.6(L)  Hematocrit 36.0 - 46.0 % 37.4 35.0(L) 35.6(L)  Platelets 150 - 400 K/uL 140(L) 122(L) 107(L)   CMP Latest Ref Rng & Units 01/19/2021 01/18/2021 01/17/2021  Glucose 70 - 99 mg/dL 124(H) 124(H) 138(H)  BUN 8 - 23 mg/dL 20 26(H) 32(H)  Creatinine 0.44 - 1.00 mg/dL 0.80 1.00 1.58(H)  Sodium 135 - 145 mmol/L 132(L) 127(L) 133(L)  Potassium 3.5 - 5.1 mmol/L 3.7 4.3 4.3  Chloride 98 - 111 mmol/L 98 97(L) 102  CO2 22 - 32 mmol/L 25 24 23   Calcium 8.9 - 10.3 mg/dL 8.5(L) 8.6(L) 8.3(L)  Total Protein 6.5 - 8.1 g/dL 5.5(L) 5.6(L) 5.6(L)  Total Bilirubin 0.3 - 1.2 mg/dL 1.2 1.0 1.2  Alkaline Phos 38 - 126 U/L 76 92 73  AST 15 - 41 U/L 14(L) 15 20  ALT 0 - 44 U/L 12 13 17      Imaging studies: No new pertinent imaging studies   Assessment/Plan: (ICD-10's: K81.0) 80 y.o. female with leukocytosis, admitted with recurrent cholecystitis s/p percutaneous cholecystostomy tube placement on 39/76, complicated by numerous pertinent comorbidities.   - Given her persistent leukocytosis and continued failure to improve clinically, I will order repeat CT Abdomen/Pelvis this morning to reassess for any missed intra-abdominal process -- I suspect she may have an ileus secondary to acute medical illness   -  Okay to continue diet as tolerated - Continue percutaneous cholecystostomy tube; monitor and record output; flush daily with 5 ccs NS. She will need to maintain this for at least 6-8 weeks - Continue IV Abx (Zosyn); follow up Cx; growing enterococcus gallinarum; susceptibilities pending             - Monitor leukocytosis; stable but  remains elevated             - Monitor renal function; improved             - Monitor abdominal examination              - Pain control prn; antiemetics prn             - Mobilization encouraged; engage therapies if needed   - Palliative care consultation is reasonable  - Further management per primary service  All of the above findings and recommendations were discussed with the patient, patient's family (Daughter on phone), and the medical team, and all of patient's and family's questions were answered to their expressed satisfaction.  -- Edison Simon, PA-C [facility] 01/19/2021, 7:38 AM [phone] M-F: 7am - 4pm  CT scan performed and personally reviewed.  No acute intra-abdominal process.  IMPRESSION: Interval percutaneous cholecystostomy tube placement with decompression of the gallbladder.   Changes of cirrhosis.  Increasing perihepatic ascites.   Moderate right pleural effusion with right lower lobe atelectasis or infiltrate.  I saw and evaluated the patient.  I agree with the above documentation, exam, and plan, which I have edited where appropriate. Fredirick Maudlin  12:31 PM

## 2021-01-19 NOTE — Assessment & Plan Note (Signed)
Due to acute cholecystitis.  Patient cannot get more IV fluid as she is getting more fluid overloaded with findings of ascites and pleural effusion.  Bumex restarted yesterday

## 2021-01-19 NOTE — Progress Notes (Signed)
Flutter and IS at bedside. Patient refused to teaching prior to going on cpap

## 2021-01-19 NOTE — Assessment & Plan Note (Signed)
Well controlled on metoprolol.

## 2021-01-19 NOTE — Assessment & Plan Note (Signed)
Multifactorial 

## 2021-01-19 NOTE — Progress Notes (Signed)
Mobility Specialist - Progress Note   01/19/21 1500  Mobility  Activity Ambulated to bathroom  Range of Motion/Exercises Right leg;Left leg  Level of Assistance Standby assist, set-up cues, supervision of patient - no hands on  Assistive Device Front wheel walker  Distance Ambulated (ft) 15 ft  Mobility Out of bed for toileting  Mobility Response Tolerated well  Mobility performed by Mobility specialist  $Mobility charge 1 Mobility    Pt participated in light therex prior to OOB activity. Pain at incision site. MinA to exit bed with extra time and heavy use of railings. Stood and ambulated to bathroom with supervision, extra time needed to stand. Pt left on toilet upon PT entry.    Kathee Delton Mobility Specialist 01/19/21, 4:01 PM

## 2021-01-19 NOTE — Assessment & Plan Note (Signed)
Improving with starting of Bumex.  This was likely due to fluid overload.

## 2021-01-19 NOTE — Assessment & Plan Note (Signed)
On Xarelto 

## 2021-01-19 NOTE — Evaluation (Signed)
Physical Therapy Evaluation Patient Details Name: Gina Chambers MRN: [id number] DOB: 1940/12/31 Today's Date: 01/19/2021  History of Present Illness  80 y.o. female with medical history significant of hypertension, diabetes mellitus, COPD, asthma, GERD, hypothyroidism, OSA on CPAP, DVT on Xarelto, dCHF, vertigo, GI bleeding, COVID-19 infection, cholecystitis, who presents with abdominal pain. S/p cholecystostomy tube on 9/26.  Clinical Impression  Pt showed good effort with mobility and especially being very motivated to do a prolonged bout of ambulation.  She did have elevated HR with activity (sustaining >120bpm and up to ~140 with increased activity).  She had some minor issues with AD use and management but was able to slowly but consistently ambulate >100 ft and maintain balance and relative safety.  Pt is far from her baseline and will need HHPT but should be safe to return home with help from family that she lives with.     Recommendations for follow up therapy are one component of a multi-disciplinary discharge planning process, led by the attending physician.  Recommendations may be updated based on patient status, additional functional criteria and insurance authorization.  Follow Up Recommendations Home health PT;Supervision - Intermittent    Equipment Recommendations   (appears she has rollator, will need walker if not)    Recommendations for Other Services       Precautions / Restrictions Precautions Precautions: Fall Restrictions Weight Bearing Restrictions: No      Mobility  Bed Mobility               General bed mobility comments: sitting pre and post session    Transfers Overall transfer level: Modified independent Equipment used: Rolling walker (2 wheeled)             General transfer comment: Pt unable to rise on first attempt to stand despite cues for set up, hand placement, etc. After further instruction she did manage to rise w/o direct assist,  reliant on walker for standing balance/support  Ambulation/Gait Ambulation/Gait assistance: Min guard Gait Distance (Feet): 125 Feet Assistive device: Rolling walker (2 wheeled)       General Gait Details: Pt with slow but determined gait.  O2 remained in the 90s on room air, HR however did rise to ~140 with the prolonged effort.  She reports that until recently she had been able to walk ~40 minutes/day and was very determined to try prolonged bout of ambulation  Stairs            Wheelchair Mobility    Modified Rankin (Stroke Patients Only)       Balance Overall balance assessment: Needs assistance Sitting-balance support: No upper extremity supported Sitting balance-Leahy Scale: Good     Standing balance support: Bilateral upper extremity supported Standing balance-Leahy Scale: Fair Standing balance comment: no unsteadiness, reliant on walker more so as she had increased fatigue                             Pertinent Vitals/Pain Pain Assessment: 0-10 Pain Score: 5  Pain Location: incision/tube site    Home Living Family/patient expects to be discharged to:: Private residence Living Arrangements: Children Available Help at Discharge: Family;Available PRN/intermittently Type of Home: House Home Access: Stairs to enter Entrance Stairs-Rails: None Entrance Stairs-Number of Steps: 1 Home Layout: Two level;Able to live on main level with bedroom/bathroom Home Equipment:  (previously had none, now with 4WW?)      Prior Function Level of Independence: Independent  Comments: Pt reports that until this hospitalization she was able to run errands, drive, stay relatively active     Hand Dominance        Extremity/Trunk Assessment   Upper Extremity Assessment Upper Extremity Assessment: Generalized weakness;Overall [facility] for tasks assessed    Lower Extremity Assessment Lower Extremity Assessment: Generalized weakness;Overall WFL for tasks  assessed       Communication   Communication: No difficulties  Cognition Arousal/Alertness: Awake/alert Behavior During Therapy: Restless Overall Cognitive Status: Within Functional Limits for tasks assessed                                        General Comments      Exercises     Assessment/Plan    PT Assessment Patient needs continued PT services  PT Problem List Decreased strength;Decreased range of motion;Decreased activity tolerance;Decreased balance;Decreased mobility;Decreased knowledge of use of DME;Decreased safety awareness;Cardiopulmonary status limiting activity       PT Treatment Interventions Gait training;Functional mobility training;Therapeutic exercise;Therapeutic activities;Stair training;DME instruction;Neuromuscular re-education;Balance training;Patient/family education    PT Goals (Current goals can be found in the Care Plan section)  Acute Rehab PT Goals Patient Stated Goal: Go home PT Goal Formulation: With patient Time For Goal Achievement: 02/02/21 Potential to Achieve Goals: Good    Frequency Min 2X/week   Barriers to discharge        Co-evaluation               AM-PAC PT "6 Clicks" Mobility  Outcome Measure Help needed turning from your back to your side while in a flat bed without using bedrails?: A Little Help needed moving from lying on your back to sitting on the side of a flat bed without using bedrails?: A Little Help needed moving to and from a bed to a chair (including a wheelchair)?: A Little Help needed standing up from a chair using your arms (e.g., wheelchair or bedside chair)?: A Little Help needed to walk in hospital room?: A Little Help needed climbing 3-5 steps with a railing? : A Little 6 Click Score: 18    End of Session Equipment Utilized During Treatment: Gait belt Activity Tolerance: Patient tolerated treatment well;Patient limited by fatigue Patient left: with chair alarm set;with call  bell/phone within reach Nurse Communication: Mobility status PT Visit Diagnosis: Muscle weakness (generalized) (M62.81);Difficulty in walking, not elsewhere classified (R26.2)    Time: 0867-6195 PT Time Calculation (min) (ACUTE ONLY): 27 min   Charges:   PT Evaluation $PT Eval Low Complexity: 1 Low PT Treatments $Gait Training: 8-22 mins        Kreg Shropshire, DPT 01/19/2021, 4:51 PM

## 2021-01-19 NOTE — Assessment & Plan Note (Signed)
Due to sepsis.  Monitor while on antibiotics

## 2021-01-19 NOTE — Assessment & Plan Note (Signed)
Stable.  Continue Mucinex, Flovent, DuoNeb and Singulair.  Patient is on room air

## 2021-01-19 NOTE — Assessment & Plan Note (Signed)
Continue use of CPAP while in the hospital

## 2021-01-19 NOTE — Assessment & Plan Note (Signed)
Hemoglobin A1c of 6.1.  Continue sliding scale and insulin Semglee 10 units subcu daily and 5 units subcu nightly

## 2021-01-19 NOTE — Assessment & Plan Note (Signed)
Bumex resumed yesterday.  2D echo in June 2022 showed EF of 55 to 60%.

## 2021-01-19 NOTE — Progress Notes (Signed)
[employer] Nurse reports patient with elevated heart rate. Patient reports feeling very fatigued after ambulating to bathroom. Was up today and ambulating today without difficulty.   Placed on tele, appears to be SV tach rate 146-152. Denies significant pain at present. Does not feel more short of breath than usual. 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  EKG show a flutter with 2:1 A:V conduction block (flutter waves clearly seen with vagal maneuver) Blood pressures low normal after given cardizem bolusx2 f/b infusion Rate started to improve after second dose IV metoprolol  BLE venous ultrasound - results pending Echo ordered CTA if BLE [place] + Pat[place]n remains hemodynamically stab;e

## 2021-01-19 NOTE — Assessment & Plan Note (Signed)
S/p IR guided cholecystostomy tube on 9/26.  Tube will need to stay in place for 6 to 8 weeks per surgery.  Continue IV antibiotics.  CT abdomen/pelvis does not show any worsening pathology other than ascites and pleural effusion

## 2021-01-20 ENCOUNTER — Inpatient Hospital Stay: Payer: Medicare Other

## 2021-01-20 ENCOUNTER — Inpatient Hospital Stay
Admit: 2021-01-20 | Discharge: 2021-01-20 | Disposition: A | Discharge status: Home / Self Care | Payer: Medicare Other | Attending: Acute Care | Admitting: Acute Care

## 2021-01-20 ENCOUNTER — Inpatient Hospital Stay: Admit: 2021-01-20 | Payer: Medicare Other

## 2021-01-20 DIAGNOSIS — I4892 Unspecified atrial flutter: Secondary | ICD-10-CM

## 2021-01-20 DIAGNOSIS — K810 Acute cholecystitis: Secondary | ICD-10-CM | POA: Diagnosis not present

## 2021-01-20 DIAGNOSIS — A419 Sepsis, unspecified organism: Secondary | ICD-10-CM | POA: Diagnosis not present

## 2021-01-20 LAB — COMPREHENSIVE METABOLIC PANEL
ALT: 13 U/L (ref 0–44)
AST: 18 U/L (ref 15–41)
Albumin: 2.5 g/dL — ABNORMAL LOW (ref 3.5–5.0)
Alkaline Phosphatase: 82 U/L (ref 38–126)
Anion gap: 9 (ref 5–15)
BUN: 19 mg/dL (ref 8–23)
CO2: 28 mmol/L (ref 22–32)
Calcium: 8 mg/dL — ABNORMAL LOW (ref 8.9–10.3)
Chloride: 93 mmol/L — ABNORMAL LOW (ref 98–111)
Creatinine, Ser: 0.76 mg/dL (ref 0.44–1.00)
GFR, Estimated: >60 mL/min (ref >60)
Glucose, Bld: 176 mg/dL — ABNORMAL HIGH (ref 70–99)
Potassium: 3.4 mmol/L — ABNORMAL LOW (ref 3.5–5.1)
Sodium: 130 mmol/L — ABNORMAL LOW (ref 135–145)
Total Bilirubin: 1.3 mg/dL — ABNORMAL HIGH (ref 0.3–1.2)
Total Protein: 5.9 g/dL — ABNORMAL LOW (ref 6.5–8.1)

## 2021-01-20 LAB — CULTURE, BLOOD (ROUTINE X 2)
Culture: NO GROWTH
Culture: NO GROWTH
Special Requests: ADEQUATE
Special Requests: ADEQUATE

## 2021-01-20 LAB — ECHOCARDIOGRAM COMPLETE
AR max vel: 1.78 cm2
AV Area VTI: 1.48 cm2
AV Area mean vel: 1.99 cm2
AV Mean grad: 17.3 mmHg
AV Peak grad: 30.8 mmHg
Ao pk vel: 2.78 m/s
Area-P 1/2: 4.74 cm2
Height: 63 in
MV VTI: 1.76 cm2
S' Lateral: 2.5 cm
Weight: 3121.71 oz

## 2021-01-20 LAB — CBC
HCT: 37.4 % (ref 36.0–46.0)
Hemoglobin: 12.4 g/dL (ref 12.0–15.0)
MCH: 28.8 pg (ref 26.0–34.0)
MCHC: 33.2 g/dL (ref 30.0–36.0)
MCV: 86.8 fL (ref 80.0–100.0)
Platelets: 155 10*3/uL (ref 150–400)
RBC: 4.31 MIL/uL (ref 3.87–5.11)
RDW: 14.6 % (ref 11.5–15.5)
WBC: 22.3 10*3/uL — ABNORMAL HIGH (ref 4.0–10.5)
nRBC: 0 % (ref 0.0–0.2)

## 2021-01-20 LAB — GLUCOSE, CAPILLARY
Glucose-Capillary: 164 mg/dL — ABNORMAL HIGH (ref 70–99)
Glucose-Capillary: 159 mg/dL — ABNORMAL HIGH (ref 70–99)
Glucose-Capillary: 169 mg/dL — ABNORMAL HIGH (ref 70–99)
Glucose-Capillary: 161 mg/dL — ABNORMAL HIGH (ref 70–99)
Glucose-Capillary: 164 mg/dL — ABNORMAL HIGH (ref 70–99)

## 2021-01-20 MED ORDER — ALBUMIN HUMAN 25 % IV SOLN
25.0000 g | Freq: Once | INTRAVENOUS | Status: AC
  Administered 2021-01-20: 25 g via INTRAVENOUS
  Filled 2021-01-20: qty 100

## 2021-01-20 MED ORDER — DILTIAZEM HCL 25 MG/5ML IV SOLN: 30.0000 mg | Freq: Once | INTRAVENOUS | Status: DC

## 2021-01-20 MED ORDER — DILTIAZEM HCL ER COATED BEADS 120 MG PO CP24
120.0000 mg | ORAL_CAPSULE | Freq: Two times a day (BID) | ORAL | Status: DC
  Administered 2021-01-20 – 2021-01-23 (×7): 120 mg via ORAL
  Filled 2021-01-20 (×7): qty 1

## 2021-01-20 MED ORDER — DILTIAZEM HCL-DEXTROSE 125-5 MG/125ML-% IV SOLN (PREMIX)
5.0000 mg/h | INTRAVENOUS | Status: DC
  Administered 2021-01-20 – 2021-01-22 (×3): 5 mg/h via INTRAVENOUS
  Filled 2021-01-20: qty 125

## 2021-01-20 MED ORDER — SODIUM CHLORIDE 0.9 % IV SOLN: INTRAVENOUS | Status: DC | PRN

## 2021-01-20 MED ORDER — DILTIAZEM HCL-DEXTROSE 125-5 MG/125ML-% IV SOLN (PREMIX)
5.0000 mg/h | INTRAVENOUS | Status: DC
  Administered 2021-01-20: 5 mg/h via INTRAVENOUS
  Administered 2021-01-20: 7.5 mg/h via INTRAVENOUS
  Administered 2021-01-20: 10 mg/h via INTRAVENOUS
  Administered 2021-01-20: 15 mg/h via INTRAVENOUS
  Administered 2021-01-20: 5 mg/h via INTRAVENOUS
  Administered 2021-01-20: 12.5 mg/h via INTRAVENOUS
  Filled 2021-01-20 (×3): qty 125

## 2021-01-20 MED ORDER — DIGOXIN 0.25 MG/ML IJ SOLN
0.2500 mg | Freq: Once | INTRAMUSCULAR | Status: AC
  Administered 2021-01-20: 0.25 mg via INTRAVENOUS
  Filled 2021-01-20: qty 2

## 2021-01-20 MED ORDER — DILTIAZEM LOAD VIA INFUSION
30.0000 mg | Freq: Once | INTRAVENOUS | Status: AC
  Administered 2021-01-20: 30 mg via INTRAVENOUS
  Filled 2021-01-20: qty 30

## 2021-01-20 MED ORDER — DILTIAZEM HCL 25 MG/5ML IV SOLN
25.0000 mg | Freq: Once | INTRAVENOUS | Status: AC
  Administered 2021-01-20: 25 mg via INTRAVENOUS
  Filled 2021-01-20: qty 5

## 2021-01-20 MED ORDER — SODIUM CHLORIDE 0.9 % IV SOLN
3.0000 g | Freq: Four times a day (QID) | INTRAVENOUS | Status: AC
  Administered 2021-01-20 – 2021-01-23 (×12): 3 g via INTRAVENOUS
  Filled 2021-01-20: qty 3
  Filled 2021-01-20 (×3): qty 8
  Filled 2021-01-20: qty 3
  Filled 2021-01-20: qty 8
  Filled 2021-01-20: qty 3
  Filled 2021-01-20 (×4): qty 8
  Filled 2021-01-20: qty 3
  Filled 2021-01-20 (×3): qty 8

## 2021-01-20 MED ORDER — OXYCODONE-ACETAMINOPHEN 5-325 MG PO TABS
1.0000 | ORAL_TABLET | ORAL | Status: DC | PRN
  Administered 2021-01-20 – 2021-01-22 (×5): 1 via ORAL
  Filled 2021-01-20 (×6): qty 1

## 2021-01-20 MED ORDER — POTASSIUM CHLORIDE 20 MEQ PO PACK
40.0000 meq | PACK | Freq: Once | ORAL | Status: AC
  Administered 2021-01-20: 40 meq via ORAL
  Filled 2021-01-20: qty 2

## 2021-01-20 MED ORDER — METOPROLOL TARTRATE 5 MG/5ML IV SOLN
10.0000 mg | Freq: Once | INTRAVENOUS | Status: AC
  Administered 2021-01-20: 10 mg via INTRAVENOUS
  Filled 2021-01-20: qty 10

## 2021-01-20 NOTE — Progress Notes (Signed)
   01/20/21 0809  Assess: MEWS Score  Pulse Rate (!) 144  Resp (!) 24  SpO2 98 %  O2 Device Nasal Cannula  O2 Flow Rate (L/min) 2 L/min  Assess: MEWS Score  MEWS Temp 0  MEWS Systolic 1  MEWS Pulse 3  MEWS RR 1  MEWS LOC 0  MEWS Score 5  MEWS Score Color Red  Assess: if the MEWS score is Yellow or Red  Were vital signs taken at a resting state? Yes  Focused Assessment No change from prior assessment  Does the patient meet 2 or more of the SIRS criteria? Yes  Does the patient have a confirmed or suspected source of infection? Yes  Provider and Rapid Response Notified? No (CN aware previously RED)  MEWS guidelines implemented *See Row Information* No, previously red, continue vital signs every 4 hours  Treat  MEWS Interventions Administered scheduled meds/treatments  Escalate  MEWS: Escalate Red: discuss with charge nurse/RN and provider, consider discussing with RRT  Notify: Charge Nurse/RN  Name of Charge Nurse/RN Notified Linard Millers RN  Date Charge Nurse/RN Notified 01/20/21  Time Charge Nurse/RN Notified 0818  Notify: Provider  Provider Name/Title Zhang MD  Date Provider Notified 01/20/21  Time Provider Notified [phone]  Notification Type  (secure chat)  Notification Reason Other (Comment) (cardizem not effective for HR control)  Document  Patient Outcome Other (Comment) (awaiting new orders or information from MD Roosevelt Locks)  Progress note created (see row info) Yes  Assess: SIRS CRITERIA  SIRS Temperature  0  SIRS Pulse 1  SIRS Respirations  1  SIRS WBC 1  SIRS Score Sum  3

## 2021-01-20 NOTE — Progress Notes (Signed)
Referring Physician(s): Fredirick Maudlin  Supervising Physician: Aletta Edouard  Patient Status:  [facility] - In-pt  Chief Complaint:  Recurrent acute cholecystitis  Brief History:  Gina Chambers is a 80 year old female known to our service with history of acute cholecystitis with cholelithiasis.  Gina Chambers is s/p 104F percutaneous cholecystostomy tube placement in IR 09/26/20.   Gina Chambers is not a surgical candidate due to multiple respiratory co-morbidities.   Gina Chambers was seen by IR in follow up on 7/14, 8/3 and 8/16 for cholangiogram and spyglass procedure consult.   Gina Chambers declined spyglass procedure and wished to have her cholecystostomy tube removed despite risk of recurrence.   Unfortunately Gina Chambers DID develop recurrent cholecystitis and underwent another perc chole on 01/16/21 by Dr. Kathlene Cote.  Subjective:  Doing ok. Lying in bed. 2 family members at bedside.  Allergies: Ropinirole, Azelastine, Bupropion, Calcium, Carbidopa-levodopa, Cefuroxime, Cephalexin, Clinoril [sulindac], Codeine, Duloxetine, Duloxetine hcl, Ezetimibe, Fenofibrate, Furosemide, Glipizide, Lorazepam, Lortab [hydrocodone-acetaminophen], Metaproterenol, Morphine and related, Nalbuphine, Naproxen, Norfloxacin, Rofecoxib, Sodium, Statins, Sulfa antibiotics, Sulfasalazine, Tramadol, Trazodone and nefazodone, Doxycycline, Iodine, Ketoprofen, Piroxicam, and Tolmetin  Medications: Prior to Admission medications   Medication Sig Start Date End Date Taking? Authorizing Provider  aspirin EC 81 MG tablet Take 81 mg by mouth at bedtime.   Yes [provider]  bumetanide (BUMEX) 0.5 MG tablet Take 1 mg by mouth 2 (two) times daily.   Yes [provider]  Chromium Picolinate 200 MCG CAPS Take 200 mcg by mouth daily.   Yes [provider]  clobetasol cream (TEMOVATE) 5.63 % Apply 1 application topically 2 (two) times daily.   Yes [provider]  diphenhydrAMINE (BENADRYL) 25 mg capsule Take 25 mg by mouth  every 6 (six) hours as needed. 04/27/19  Yes [provider]  fluticasone (FLOVENT HFA) 110 MCG/ACT inhaler Inhale 2 puffs into the lungs 2 (two) times daily.   Yes [provider]  gabapentin (NEURONTIN) 100 MG capsule Take 100 mg by mouth 4 (four) times daily.   Yes [provider]  insulin glargine (LANTUS) 100 UNIT/ML injection Inject 10-20 Units into the skin See admin instructions. Inject 20u under the skin every morning and inject 10u under the skin ever night at bedtime   Yes [provider]  insulin regular (NOVOLIN R,HUMULIN R) 250 units/2.14mL (100 units/mL) injection Inject 10 Units into the skin 3 (three) times daily before meals.   Yes [provider]  Ipratropium-Albuterol (COMBIVENT) 20-100 MCG/ACT AERS respimat Inhale 1 puff into the lungs 4 (four) times daily as needed for wheezing or shortness of breath.   Yes [provider]  lansoprazole (PREVACID) 30 MG capsule Take 30 mg by mouth 2 (two) times daily.   Yes [provider]  levalbuterol Penne Lash) 0.63 MG/3ML nebulizer solution Inhale 0.63 mg into the lungs every 6 (six) hours as needed. 04/27/19  Yes [provider]  levothyroxine (SYNTHROID, LEVOTHROID) 125 MCG tablet Take 125 mcg by mouth daily.   Yes [provider]  Magnesium 500 MG TABS Take 500 mg by mouth 4 (four) times daily.    Yes [provider]  metoprolol succinate (TOPROL-XL) 50 MG 24 hr tablet Take 50 mg by mouth daily. Take with or immediately following a meal.   Yes [provider]  mirabegron ER (MYRBETRIQ) 25 MG TB24 tablet Take 25 mg by mouth daily.   Yes [provider]  montelukast (SINGULAIR) 10 MG tablet Take 10 mg by mouth at bedtime.   Yes  [provider]  Multiple Vitamins-Minerals (CENTRUM SILVER PO) Take 1 tablet by mouth daily.   Yes [provider]  Multiple Vitamins-Minerals (PRESERVISION AREDS 2 PO) Take 1 tablet by mouth 2 (two)  times daily.   Yes [provider]  rivaroxaban (XARELTO) 20 MG TABS tablet Take 20 mg by mouth daily.   Yes [provider]  Ubiquinol 300 MG CAPS Take 1 capsule by mouth daily.   Yes [provider]  vitamin E 1000 UNIT capsule Take 1 capsule by mouth daily.   Yes [provider]  zinc gluconate 50 MG tablet Take 50 mg by mouth daily.   Yes [provider]     Vital Signs: BP (!) 137/49   Pulse (!) 54   Temp 97.7 F (36.5 C)   Resp 18   Ht 5\' 3"  (1.6 m)   Wt 88.5 kg   SpO2 100%   BMI 34.56 kg/m   Physical Exam Vitals reviewed.  Constitutional:      Appearance: Normal appearance.  Cardiovascular:     Rate and Rhythm: Normal rate.  Pulmonary:     Effort: Pulmonary effort is normal. No respiratory distress.  Abdominal:     Palpations: Abdomen is soft.     Comments: RUQ perc chole in place. Bilious drainage in bag ~170 mL output recorded. Drain bandages appeared bloody, I removed them and RN will place clean bandages. RN confirms drain flushes easily.  Neurological:     General: No focal deficit present.     Mental Status: Gina Chambers is alert and oriented to person, place, and time.  Psychiatric:        Mood and Affect: Mood normal.        Behavior: Behavior normal.    Imaging: CT ABDOMEN PELVIS WO CONTRAST  Result Date: 01/19/2021 CLINICAL DATA:  Abdominal distention. Cholecystitis status post percutaneous cholecystostomy tube placement. Continued leukocytosis, pain. EXAM: CT ABDOMEN AND PELVIS WITHOUT CONTRAST TECHNIQUE: Multidetector CT imaging of the abdomen and pelvis was performed following the standard protocol without IV contrast. COMPARISON:  01/15/2021 FINDINGS: Lower chest: New moderate right pleural effusion with compressive atelectasis/infiltrate in the right lower lobe. Hepatobiliary: Post percutaneous cholecystostomy tube placement. The gallbladder is decompressed. No biliary ductal dilatation. Nodular contours of the  liver compatible with cirrhosis. Pancreas: No focal abnormality or ductal dilatation. Spleen: No focal abnormality.  Normal size. Adrenals/Urinary Tract: Perinephric stranding bilaterally, increasing since prior study. No hydronephrosis. Adrenal glands and urinary bladder grossly unremarkable. Stomach/Bowel: Postoperative changes in the stomach. No bowel obstruction or acute finding. Vascular/Lymphatic: Aortic atherosclerosis. No evidence of aneurysm or adenopathy. Reproductive: Uterus and adnexa unremarkable.  No mass. Other: Trace free fluid in the cul-de-sac. Increasing perihepatic ascites. Musculoskeletal: No acute bony abnormality. IMPRESSION: Interval percutaneous cholecystostomy tube placement with decompression of the gallbladder. Changes of cirrhosis.  Increasing perihepatic ascites. Moderate right pleural effusion with right lower lobe atelectasis or infiltrate. Electronically Signed   By: Rolm Baptise M.D.   On: 01/19/2021 10:30   US Venous Img Lower Bilateral (DVT)  Result Date: 01/20/2021 CLINICAL DATA:  Positive D-dimer.  History of DVT EXAM: BILATERAL LOWER EXTREMITY VENOUS DOPPLER ULTRASOUND TECHNIQUE: Gray-scale sonography with compression, as well as color and duplex ultrasound, were performed to evaluate the deep venous system(s) from the level of the common femoral vein through the popliteal and proximal calf veins. COMPARISON:  05/29/2016 right lower extremity Doppler ultrasound. FINDINGS: VENOUS Right: Normal compressibility of the common femoral, superficial femoral, and popliteal veins, as  well as the visualized calf veins. Visualized portions of profunda femoral vein and great saphenous vein unremarkable. No filling defects to suggest DVT on grayscale or color Doppler imaging. Doppler waveforms show normal direction of venous flow, normal respiratory plasticity and response to augmentation. Left: Peripheral mid level echoes with incomplete compressibility at the left popliteal vein. The  other left lower extremity deep veins showed normal color filling and compressibility. Present respiratory phasicity. IMPRESSION: 1. Nonocclusive clot in the left popliteal vein that is likely nonacute. 2. Negative for right lower extremity DVT. Electronically Signed   By: Jorje Guild M.D.   On: 01/20/2021 06:28   IR Perc Cholecystostomy  Result Date: 01/16/2021 CLINICAL DATA:  Recurrent acute cholecystitis and status post prior percutaneous cholecystostomy tube placement on 09/26/2020. The tube was removed on 12/06/2020 after the patient insisted it be removed. Gina Chambers has developed recurrent acute cholecystitis and sepsis and is not a candidate currently for cholecystectomy. EXAM: PERCUTANEOUS CHOLECYSTOSTOMY COMPARISON:  CT of the abdomen and pelvis on 01/15/2021 ANESTHESIA/SEDATION: Moderate (conscious) sedation was employed during this procedure. A total of Versed 1.0 mg and Fentanyl 100 mcg was administered intravenously. Moderate Sedation Time: 25 minutes. The patient's level of consciousness and vital signs were monitored continuously by radiology nursing throughout the procedure under my direct supervision. CONTRAST:  25mL OMNIPAQUE IOHEXOL 350 MG/ML SOLN MEDICATIONS: No additional medications were administered. FLUOROSCOPY TIME:  30 seconds.  8.1 mGy. PROCEDURE: The procedure, risks, benefits, and alternatives were explained to the patient. Questions regarding the procedure were encouraged and answered. The patient understands and consents to the procedure. A time-out was performed prior to initiating the procedure. The right abdominal wall was prepped with chlorhexidine in a sterile fashion, and a sterile drape was applied covering the operative field. A sterile gown and sterile gloves were used for the procedure. Local anesthesia was provided with 1% Lidocaine. Ultrasound image documentation was performed. Fluoroscopy during the procedure and fluoro spot radiograph confirms appropriate catheter  position. Ultrasound was utilized to localize the gallbladder. Under direct ultrasound guidance, an 18 gauge trocar needle was advanced into the gallbladder lumen. Aspiration was performed and a bile sample sent for culture studies. A small amount of diluted contrast material was injected. A guide wire was then advanced into the gallbladder. Percutaneous tract dilatation was then performed over a guide wire to 10-French. A 10-French pigtail drainage catheter was then advanced into the gallbladder lumen under fluoroscopy. Catheter was formed and injected with contrast material to confirm position. The catheter was flushed and connected to a gravity drainage bag. It was secured at the skin with a Prolene retention suture and Stat-Lock device. COMPLICATIONS: None FINDINGS: After needle puncture of the gallbladder, a bile sample was aspirated and sent for culture. Bile return was dark in color with debris. The cholecystostomy tube was advanced into the gallbladder lumen and formed. It is now draining bile. This tube will be left to gravity drainage. IMPRESSION: Percutaneous cholecystostomy with placement of 10-French drainage catheter into the gallbladder lumen. This was left to gravity drainage. Electronically Signed   By: Aletta Edouard M.D.   On: 01/16/2021 16:17   ECHOCARDIOGRAM COMPLETE  Result Date: 01/20/2021    ECHOCARDIOGRAM REPORT   Patient Name:   Gina Chambers Date of Exam: 01/20/2021 Medical Rec #:  [id number]     Height:       63.0 in Accession #:    [id number]    Weight:       195.1 lb Date of  Birth:  02-13-1941    BSA:          1.913 m Patient Age:    55 years      BP:           96/56 mmHg Patient Gender: F             HR:           144 bpm. Exam Location:  [facility] Procedure: 2D Echo, Cardiac Doppler and Color Doppler Indications:     Atrial Flutter I48.92  History:         Patient has prior history of Echocardiogram examinations, most                  recent 09/26/2020. CHF; Risk Factors:Diabetes. DVT.                    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  Sonographer:     Sherrie Sport Referring Phys:  [id number] BRENDA MORRISON Diagnosing Phys: Serafina Royals MD IMPRESSIONS  1. Left ventricular ejection fraction, by estimation, is 65 to 70%. The left ventricle has normal function. The left ventricle has no regional wall motion abnormalities. Left ventricular diastolic parameters were normal.  2. Right ventricular systolic function is normal. The right ventricular size is normal.  3. Left atrial size was moderately dilated.  4. The mitral valve has been repaired/replaced. Trivial mitral valve regurgitation. 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 Echo findings are consistent with normal structure and function of the mitral valve prosthesis.  5. The aortic valve is normal in structure. Aortic valve regurgitation is trivial. FINDINGS  Left Ventricle: Left ventricular ejection fraction, by estimation, is 65 to 70%. The left ventricle has normal function. The left ventricle has no regional wall motion abnormalities. The left ventricular internal cavity size was small. There is no left ventricular hypertrophy. Left ventricular diastolic parameters were normal. Right Ventricle: The right ventricular size is normal. No increase in right ventricular wall thickness. Right ventricular systolic function is normal. Left Atrium: Left atrial size was moderately dilated. Right Atrium: Right atrial size was normal in size. Pericardium: There is no evidence of pericardial effusion. Mitral Valve: The mitral valve has been repaired/replaced. Trivial mitral valve regurgitation. 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 Echo findings are consistent with normal structure and function of the mitral valve prosthesis. MV peak gradient, 13.0 mmHg. The mean mitral valve gradient is 6.0 mmHg. Tricuspid Valve: The tricuspid valve is normal in  structure. Tricuspid valve regurgitation is trivial. Aortic Valve: The aortic valve is normal in structure. Aortic valve regurgitation is trivial. Aortic valve mean gradient measures 17.3 mmHg. Aortic valve peak gradient measures 30.8 mmHg. Aortic valve area, by VTI measures 1.48 cm. Pulmonic Valve: The pulmonic valve was normal in structure. Pulmonic valve regurgitation is not visualized. Aorta: The aortic root and ascending aorta are structurally normal, with no evidence of dilitation. IAS/Shunts: No atrial level shunt detected by color flow Doppler.  LEFT VENTRICLE PLAX 2D LVIDd:         3.50 cm LVIDs:         2.50 cm LV PW:         1.10 cm LV IVS:        1.40 cm LVOT diam:     2.00 cm LV SV:  71 LV SV Index:   37 LVOT Area:     3.14 cm  RIGHT VENTRICLE RV Basal diam:  3.50 cm RV S prime:     14.80 cm/s TAPSE (M-mode): 3.5 cm LEFT ATRIUM             Index       RIGHT ATRIUM           Index LA diam:        2.80 cm 1.46 cm/m  RA Area:     15.60 cm LA Vol (A2C):   90.8 ml 47.45 ml/m RA Volume:   42.60 ml  22.26 ml/m LA Vol (A4C):   78.1 ml 40.82 ml/m LA Biplane Vol: 84.1 ml 43.95 ml/m  AORTIC VALVE                    PULMONIC VALVE AV Area (Vmax):    1.78 cm     PV Vmax:        0.30 m/s AV Area (Vmean):   1.99 cm     PV Peak grad:   0.4 mmHg AV Area (VTI):     1.48 cm     RVOT Peak grad: 3 mmHg AV Vmax:           277.67 cm/s AV Vmean:          191.333 cm/s AV VTI:            0.478 m AV Peak Grad:      30.8 mmHg AV Mean Grad:      17.3 mmHg LVOT Vmax:         157.00 cm/s LVOT Vmean:        121.000 cm/s LVOT VTI:          0.225 m LVOT/AV VTI ratio: 0.47  AORTA Ao Root diam: 2.00 cm MITRAL VALVE                TRICUSPID VALVE MV Area (PHT): 4.74 cm     TR Peak grad:   39.4 mmHg MV Area VTI:   1.76 cm     TR Vmax:        314.00 cm/s MV Peak grad:  13.0 mmHg MV Mean grad:  6.0 mmHg     SHUNTS MV Vmax:       1.80 m/s     Systemic VTI:  0.22 m MV Vmean:      110.7 cm/s   Systemic Diam: 2.00 cm MV Decel  Time: 160 msec MV E velocity: 180.00 cm/s MV A velocity: 88.60 cm/s MV E/A ratio:  2.03 Serafina Royals MD Electronically signed by Serafina Royals MD Signature Date/Time: 01/20/2021/12:39:30 PM    Final     Labs:  CBC: Recent Labs    01/18/21 0757 01/19/21 0534 01/19/21 2231 01/20/21 0908  WBC 22.2* 21.2* 22.3* 22.3*  HGB 11.5* 12.0 11.8* 12.4  HCT 35.0* 37.4 33.5* 37.4  PLT 122* 140* 146* 155    COAGS: Recent Labs    09/25/20 0529 01/15/21 1907 01/16/21 0225  INR 1.3* 1.1  --   APTT  --  136* 36    BMP: Recent Labs    01/18/21 0431 01/19/21 0534 01/19/21 2231 01/20/21 0908  NA 127* 132* 128* 130*  K 4.3 3.7 4.1 3.4*  CL 97* 98 93* 93*  CO2 24 25 26 28   GLUCOSE 124* 124* 133* 176*  BUN 26* 20 22 19   CALCIUM 8.6* 8.5* 8.1* 8.0*  CREATININE 1.00  0.80 0.86 0.76  GFRNONAA 58* >60 >60 >60    LIVER FUNCTION TESTS: Recent Labs    01/18/21 0431 01/19/21 0534 01/19/21 2231 01/20/21 0908  BILITOT 1.0 1.2 1.7* 1.3*  AST 15 14* 24 18  ALT 13 12 10 13   ALKPHOS 92 76 80 82  PROT 5.6* 5.5* 5.6* 5.9*  ALBUMIN 2.5* 2.3* 2.3* 2.5*    Assessment and Plan:  Recurrent acute cholecystitis.  Strongly recommend NOT removing perc chole.   Gina Chambers will need routine exchange every 8-10 weeks.  Continue routine drain care. Ok to change bandages on drain if soiled.  Electronically Signed: Murrell Redden, PA-C 01/20/2021, 3:24 PM    I spent a total of 15 Minutes at the the patient's bedside AND on the patient's hospital floor or unit, greater than 50% of which was counseling/coordinating care for f/u perc chole.

## 2021-01-20 NOTE — Progress Notes (Signed)
Pt admitted post cholecystoscopy drain placement. Pt HR elevated to the 140's orders were placed for EKG, Metoprolol and one time IV dose of Cardizem but HR continued to stay in the 140"s. Pt transferred to 2A for Cardizem IV drip

## 2021-01-20 NOTE — Progress Notes (Addendum)
At beginning of shift, paitent heart rate sustained 140s. Two additional doses of digoxin given to patient, while patient was on cardizem drip at 15 mL/hr. Around 1pm, patient heart rate between 70-90bpm. MD notifed and ordered to titrate drip down. Currently, drip at 55mL/hr. Heart rate 84, PO cardizem given @ 1:25pm.   Daughter requested palliative consult, unavailable at this time. MD Roosevelt Locks to bedside and discussed care plan for patient with daughter. Patient remains with active care plan at this time. DNR status still remains. Pt's progress will be monitored closely for change in plan. Pain medication changed from IV dilaudid to PO percocet. Patient denies any needs at this time.

## 2021-01-20 NOTE — Consult Note (Addendum)
[facility] Cardiology Consultation Note  Patient ID: Gina Chambers, MRN: [id number], DOB/AGE: 10/07/1940 80 y.o. Admit date: 01/15/2021   Date of Consult: 01/20/2021 Primary Physician: Kirk Ruths, MD Primary Cardiologist: Dr. Saralyn Pilar  Chief Complaint:  Chief Complaint  Patient presents with  . Abdominal Pain   Reason for Consult: atrial flutter  HPI: 80 y.o. female with a past medical history of aortic and coronary atherosclerosis, bypass grafting, chronic DVT, HFpEF, history of aortic valve replacement with bioprosthetic valve, history of mitral valve replacement with bioprosthetic valve, OSA on CPAP, Type 2 diabetes, hypercholesterolemia, cholecystitis.  Patient currently admit for abdominal pain and cholecystitis with a PERC drain in place.  Overnight last night she appeared to have issues with tachycardia, SVT and an episode of atrial flutter.  Patient had been treated with a Cardizem bolus and IV metoprolol.  She currently states that she continues to have generalized abdominal pain but currently denies any problems with chest discomfort, palpitations or fluttering sensation in chest, shortness of breath.  She is currently anticoagulated on Xarelto 20 mg daily for treatment of DVT.  Today patient continues to have an elevated heart rate of approximately 145 bpm.   Past Medical History:  Diagnosis Date  . Asthma   . Asthmatic bronchitis , chronic ([place])   . CHF (congestive heart failure) ([place])   . Diabetes mellitus without complication ([place])   . 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 last one approx 2013  . Family history of adverse reaction to anesthesia    Mother - temporary paralysis of 1 side  . GERD (gastroesophageal reflux disease)   . Hypertension   . Neuropathy    feet  . PONV (postoperative nausea and vomiting)    Pt reports violent vomiting with ANY pain meds given with anesthesia.  . Sleep apnea    CPAP  . Thyroid disease   . Vertigo     daily      Surgical History:  Past Surgical History:  Procedure Laterality Date  . CARDIAC SURGERY    . CATARACT EXTRACTION W/PHACO Right 03/11/2017   Procedure: CATARACT EXTRACTION PHACO AND INTRAOCULAR LENS PLACEMENT ([place])  RIGHT DIABETIC;  Surgeon: Eulogio Bear, MD;  Location: [place];  Service: Ophthalmology;  Laterality: Right;  Diabetic - insulin sleep apnea  . CATARACT EXTRACTION W/PHACO Left 04/02/2017   Procedure: CATARACT EXTRACTION PHACO AND INTRAOCULAR LENS PLACEMENT ([place]) LEFT DIABETES;  Surgeon: Eulogio Bear, MD;  Location: [place];  Service: Ophthalmology;  Laterality: Left;  Diabetic - insulin  . COLONOSCOPY WITH PROPOFOL N/A 11/09/2016   Procedure: COLONOSCOPY WITH PROPOFOL;  Surgeon: Jonathon Bellows, MD;  Location: [facility] ENDOSCOPY;  Service: Gastroenterology;  Laterality: N/A;  . HERNIA REPAIR    . IR CHOLANGIOGRAM EXISTING TUBE  12/06/2020  . IR PERC CHOLECYSTOSTOMY  01/16/2021  . IR RADIOLOGIST EVAL & MGMT  11/23/2020  . VERTICAL BANDED GASTROPLASTY       Home Meds: Prior to Admission medications   Medication Sig Start Date End Date Taking? Authorizing Provider  aspirin EC 81 MG tablet Take 81 mg by mouth at bedtime.   Yes [provider]  bumetanide (BUMEX) 0.5 MG tablet Take 1 mg by mouth 2 (two) times daily.   Yes [provider]  Chromium Picolinate 200 MCG CAPS Take 200 mcg by mouth daily.   Yes [provider]  clobetasol cream (TEMOVATE) 8.33 % Apply 1 application topically 2 (two) times  daily.   Yes [provider]  diphenhydrAMINE (BENADRYL) 25 mg capsule Take 25 mg by mouth every 6 (six) hours as needed. 04/27/19  Yes [provider]  fluticasone (FLOVENT HFA) 110 MCG/ACT inhaler Inhale 2 puffs into the lungs 2 (two) times daily.   Yes [provider]  gabapentin (NEURONTIN) 100 MG capsule Take 100 mg by mouth 4 (four) times daily.   Yes [provider]  insulin  glargine (LANTUS) 100 UNIT/ML injection Inject 10-20 Units into the skin See admin instructions. Inject 20u under the skin every morning and inject 10u under the skin ever night at bedtime   Yes [provider]  insulin regular (NOVOLIN R,HUMULIN R) 250 units/2.60mL (100 units/mL) injection Inject 10 Units into the skin 3 (three) times daily before meals.   Yes [provider]  Ipratropium-Albuterol (COMBIVENT) 20-100 MCG/ACT AERS respimat Inhale 1 puff into the lungs 4 (four) times daily as needed for wheezing or shortness of breath.   Yes [provider]  lansoprazole (PREVACID) 30 MG capsule Take 30 mg by mouth 2 (two) times daily.   Yes [provider]  levalbuterol Penne Lash) 0.63 MG/3ML nebulizer solution Inhale 0.63 mg into the lungs every 6 (six) hours as needed. 04/27/19  Yes [provider]  levothyroxine (SYNTHROID, LEVOTHROID) 125 MCG tablet Take 125 mcg by mouth daily.   Yes [provider]  Magnesium 500 MG TABS Take 500 mg by mouth 4 (four) times daily.    Yes [provider]  metoprolol succinate (TOPROL-XL) 50 MG 24 hr tablet Take 50 mg by mouth daily. Take with or immediately following a meal.   Yes [provider]  mirabegron ER (MYRBETRIQ) 25 MG TB24 tablet Take 25 mg by mouth daily.   Yes [provider]  montelukast (SINGULAIR) 10 MG tablet Take 10 mg by mouth at bedtime.   Yes [provider]  Multiple Vitamins-Minerals (CENTRUM SILVER PO) Take 1 tablet by mouth daily.   Yes [provider]  Multiple Vitamins-Minerals (PRESERVISION AREDS 2 PO) Take 1 tablet by mouth 2 (two) times daily.   Yes [provider]  rivaroxaban (XARELTO) 20 MG TABS tablet Take 20 mg by mouth daily.   Yes [provider]  Ubiquinol 300 MG CAPS Take 1 capsule by mouth daily.   Yes [provider]  vitamin E 1000 UNIT capsule Take 1 capsule by mouth daily.   Yes [provider]   zinc gluconate 50 MG tablet Take 50 mg by mouth daily.   Yes [provider]    Inpatient Medications:  . aspirin EC  81 mg Oral QHS  . bumetanide  1 mg Oral BID  . digoxin  0.25 mg Intravenous Once  . fluticasone  2 puff Inhalation BID  . gabapentin  100 mg Oral QID  . insulin aspart  0-5 Units Subcutaneous QHS  . insulin aspart  0-9 Units Subcutaneous TID WC  . insulin glargine-yfgn  10 Units Subcutaneous Daily   And  . insulin glargine-yfgn  5 Units Subcutaneous QHS  . ipratropium-albuterol  3 mL Nebulization Q6H  . levothyroxine  125 mcg Oral Q0600  . metoprolol succinate  50 mg Oral Daily  . montelukast  10 mg Oral QHS  . multivitamin with minerals   Oral Daily  . pantoprazole  20 mg Oral Daily  . rivaroxaban  20 mg Oral Q supper  . sodium chloride flush  5 mL Intracatheter Q8H   . diltiazem (CARDIZEM)  infusion 15 mg/hr (01/20/21 0329)  . piperacillin-tazobactam (ZOSYN)  IV 3.375 g (01/20/21 0730)    Allergies:  Allergies  Allergen Reactions  . Ropinirole Nausea And Vomiting  . Azelastine Other (See Comments)    Closes airways  Closes airways    . Bupropion     GI issues   . Calcium     Chest pain   . Carbidopa-Levodopa Other (See Comments)    Patient reports chest pain, back pain and left side pain  . Cefuroxime     Swelling   . Cephalexin   . Clinoril [Sulindac]     GI  . Codeine Hives  . Duloxetine     Bleeding   . Duloxetine Hcl Other (See Comments)    Bleeding  . Ezetimibe     Joint pain   . Fenofibrate     Leg cramps  . Furosemide     Fluid retention   . Glipizide     Bloating   . Lorazepam     SI  . Lortab [Hydrocodone-Acetaminophen] Hives  . Metaproterenol     Palpitations   . Morphine And Related   . Nalbuphine     Rapid heart rate  flushing   . Naproxen     Numbness   . Norfloxacin     Urinary retention   . Rofecoxib   . Sodium   . Statins     Muscle pain   . Sulfa Antibiotics     Unknown   .  Sulfasalazine Other (See Comments)    Unknown  . Tramadol   . Trazodone And Nefazodone   . Doxycycline Rash  . Iodine Rash  . Ketoprofen Rash  . Piroxicam Rash  . Tolmetin Rash    Social History   Socioeconomic History  . Marital status: Single    Spouse name: Not on file  . Number of children: Not on file  . Years of education: Not on file  . Highest education level: Not on file  Occupational History  . Not on file  Tobacco Use  . Smoking status: Never  . Smokeless tobacco: Never  Vaping Use  . Vaping Use: Never used  Substance and Sexual Activity  . Alcohol use: No  . Drug use: No  . Sexual activity: Not on file  Other Topics Concern  . Not on file  Social History Narrative  . Not on file   Social Determinants of Health   Financial Resource Strain: Not on file  Food Insecurity: Not on file  Transportation Needs: Not on file  Physical Activity: Not on file  Stress: Not on file  Social Connections: Not on file  Intimate Partner Violence: Not on file     Family History  Problem Relation Age of Onset  . Breast cancer Mother        93's  . Breast cancer Maternal Grandmother   . Breast cancer Paternal Grandmother      Review of Systems Positive for fatigue, abdominal pain Negative for: General:  chills, fever, night sweats or weight changes.  Cardiovascular: PND orthopnea syncope dizziness  Dermatological skin lesions rashes Respiratory: Cough congestion Urologic: Frequent urination urination at night and hematuria Abdominal: negative for nausea, vomiting, diarrhea, bright red blood per rectum, melena, or hematemesis Neurologic: negative for visual changes, and/or hearing changes  All other systems reviewed and are otherwise negative except as noted above.  Labs: No results for input(s): CKTOTAL, CKMB, TROPONINI in the last 72 hours. Lab Results  Component Value Date   WBC 22.3 (H) 01/19/2021   HGB 11.8 (L) 01/19/2021   HCT 33.5 (L) 01/19/2021    MCV 86.6 01/19/2021   PLT 146 (L) 01/19/2021    Recent Labs  Lab 01/19/21 2231  NA 128*  K 4.1  CL 93*  CO2 26  BUN 22  CREATININE 0.86  CALCIUM 8.1*  PROT 5.6*  BILITOT 1.7*  ALKPHOS 80  ALT 10  AST 24  GLUCOSE 133*   No results found for: CHOL, HDL, LDLCALC, TRIG Lab Results  Component Value Date   DDIMER 4.02 (H) 01/19/2021    Radiology/Studies:  CT ABDOMEN PELVIS WO CONTRAST  Result Date: 01/19/2021 CLINICAL DATA:  Abdominal distention. Cholecystitis status post percutaneous cholecystostomy tube placement. Continued leukocytosis, pain. EXAM: CT ABDOMEN AND PELVIS WITHOUT CONTRAST TECHNIQUE: Multidetector CT imaging of the abdomen and pelvis was performed following the standard protocol without IV contrast. COMPARISON:  01/15/2021 FINDINGS: Lower chest: New moderate right pleural effusion with compressive atelectasis/infiltrate in the right lower lobe. Hepatobiliary: Post percutaneous cholecystostomy tube placement. The gallbladder is decompressed. No biliary ductal dilatation. Nodular contours of the liver compatible with cirrhosis. Pancreas: No focal abnormality or ductal dilatation. Spleen: No focal abnormality.  Normal size. Adrenals/Urinary Tract: Perinephric stranding bilaterally, increasing since prior study. No hydronephrosis. Adrenal glands and urinary bladder grossly unremarkable. Stomach/Bowel: Postoperative changes in the stomach. No bowel obstruction or acute finding. Vascular/Lymphatic: Aortic atherosclerosis. No evidence of aneurysm or adenopathy. Reproductive: Uterus and adnexa unremarkable.  No mass. Other: Trace free fluid in the cul-de-sac. Increasing perihepatic ascites. Musculoskeletal: No acute bony abnormality. IMPRESSION: Interval percutaneous cholecystostomy tube placement with decompression of the gallbladder. Changes of cirrhosis.  Increasing perihepatic ascites. Moderate right pleural effusion with right lower lobe atelectasis or infiltrate. Electronically  Signed   By: Rolm Baptise M.D.   On: 01/19/2021 10:30   CT ABDOMEN PELVIS WO CONTRAST  Result Date: 01/15/2021 CLINICAL DATA:  Abdominal distension and right upper quadrant abdominal pain. History of cholecystostomy tube removal on 12/06/2020 EXAM: CT ABDOMEN AND PELVIS WITHOUT CONTRAST TECHNIQUE: Multidetector CT imaging of the abdomen and pelvis was performed following the standard protocol without IV contrast. COMPARISON:  CT 09/23/2020, ultrasound 01/15/2021 FINDINGS: Lower chest: Bibasilar dependent atelectasis. Heart size is normal. No pericardial effusion. Hepatobiliary: Gallbladder is moderately distended with multiple hyperdense gallstones. Gallbladder wall appears diffusely thickened with pericholecystic inflammatory fat stranding and small volume of adjacent fluid. Unenhanced liver is within normal limits. Small volume perihepatic ascites. Pancreas: Unremarkable. No pancreatic ductal dilatation or surrounding inflammatory changes. Spleen: Unremarkable. Adrenals/Urinary Tract: Slightly nodular configuration of the left adrenal gland is unchanged. Unremarkable right adrenal gland. Bilateral kidneys are within normal limits. No renal stone or hydronephrosis. Urinary bladder is unremarkable. Stomach/Bowel: Postsurgical changes again noted at the stomach. No evidence of bowel wall thickening, distention, or inflammatory changes. Vascular/Lymphatic: Scattered aortoiliac atherosclerotic calcifications without aneurysm. No abdominopelvic lymphadenopathy. Reproductive: Uterus and bilateral adnexa are unremarkable. Other: No organized abdominopelvic fluid collection. No pneumoperitoneum. No abdominal wall hernia. Musculoskeletal: No new or acute bony findings. IMPRESSION: 1. Appearance of the gallbladder remains suspicious for acute cholecystitis. 2. Small volume perihepatic ascites. Aortic Atherosclerosis (ICD10-I70.0). Electronically Signed   By: Davina Poke D.O.   On: 01/15/2021 15:10   DG Chest 2  View  Result Date: 01/15/2021 CLINICAL DATA:  Pt arrived via [facility] with c/o upper abd pain, started a juice cleanse last Wednesday, states it was apple juice, Pt c/o N/V and abd pain. Last BM yesterday was  loose, history of asthma, CHF, diabetes, GERD, hypertension, neuropathy EXAM: CHEST - 2 VIEW COMPARISON:  12/24/2018 FINDINGS: Stable changes from prior cardiac surgery and valve replacements. Cardiac silhouette is normal in size. No mediastinal or hilar masses or evidence of adenopathy. Dependent opacity at both lung bases consistent with atelectasis. Remainder of the lungs is clear. No convincing pleural effusion and no pneumothorax. Skeletal structures are intact. IMPRESSION: No acute cardiopulmonary disease. Electronically Signed   By: Lajean Manes M.D.   On: 01/15/2021 13:37   DG Shoulder Right  Result Date: 01/15/2021 CLINICAL DATA:  Right shoulder pain EXAM: RIGHT SHOULDER - 2+ VIEW COMPARISON:  None. FINDINGS: No fracture or malalignment. Moderate AC joint degenerative change with mild glenohumeral degenerative change. Small amount of calcific tendinopathy. IMPRESSION: 1. Degenerative changes without acute osseous abnormality. 2. Calcific tendinopathy Electronically Signed   By: Donavan Foil M.D.   On: 01/15/2021 18:08   IR Perc Cholecystostomy  Result Date: 01/16/2021 CLINICAL DATA:  Recurrent acute cholecystitis and status post prior percutaneous cholecystostomy tube placement on 09/26/2020. The tube was removed on 12/06/2020 after the patient insisted it be removed. She has developed recurrent acute cholecystitis and sepsis and is not a candidate currently for cholecystectomy. EXAM: PERCUTANEOUS CHOLECYSTOSTOMY COMPARISON:  CT of the abdomen and pelvis on 01/15/2021 ANESTHESIA/SEDATION: Moderate (conscious) sedation was employed during this procedure. A total of Versed 1.0 mg and Fentanyl 100 mcg was administered intravenously. Moderate Sedation Time: 25 minutes. The patient's level of  consciousness and vital signs were monitored continuously by radiology nursing throughout the procedure under my direct supervision. CONTRAST:  49mL OMNIPAQUE IOHEXOL 350 MG/ML SOLN MEDICATIONS: No additional medications were administered. FLUOROSCOPY TIME:  30 seconds.  8.1 mGy. PROCEDURE: The procedure, risks, benefits, and alternatives were explained to the patient. Questions regarding the procedure were encouraged and answered. The patient understands and consents to the procedure. A time-out was performed prior to initiating the procedure. The right abdominal wall was prepped with chlorhexidine in a sterile fashion, and a sterile drape was applied covering the operative field. A sterile gown and sterile gloves were used for the procedure. Local anesthesia was provided with 1% Lidocaine. Ultrasound image documentation was performed. Fluoroscopy during the procedure and fluoro spot radiograph confirms appropriate catheter position. Ultrasound was utilized to localize the gallbladder. Under direct ultrasound guidance, an 18 gauge trocar needle was advanced into the gallbladder lumen. Aspiration was performed and a bile sample sent for culture studies. A small amount of diluted contrast material was injected. A guide wire was then advanced into the gallbladder. Percutaneous tract dilatation was then performed over a guide wire to 10-French. A 10-French pigtail drainage catheter was then advanced into the gallbladder lumen under fluoroscopy. Catheter was formed and injected with contrast material to confirm position. The catheter was flushed and connected to a gravity drainage bag. It was secured at the skin with a Prolene retention suture and Stat-Lock device. COMPLICATIONS: None FINDINGS: After needle puncture of the gallbladder, a bile sample was aspirated and sent for culture. Bile return was dark in color with debris. The cholecystostomy tube was advanced into the gallbladder lumen and formed. It is now draining  bile. This tube will be left to gravity drainage. IMPRESSION: Percutaneous cholecystostomy with placement of 10-French drainage catheter into the gallbladder lumen. This was left to gravity drainage. Electronically Signed   By: Aletta Edouard M.D.   On: 01/16/2021 16:17   US ABDOMEN LIMITED RUQ (LIVER/GB)  Result Date: 01/15/2021 CLINICAL DATA:  Right  upper quadrant pain since this morning. History of a gallbladder drainage in June 2022. Drain removed in August 2022. EXAM: ULTRASOUND ABDOMEN LIMITED RIGHT UPPER QUADRANT COMPARISON:  Ultrasound, 09/26/2020. FINDINGS: Gallbladder: Gallbladder is distended. There are dependent stones and sludge. Wall thickened to between 4 and 8 mm. Small amount of pericholecystic fluid. Common bile duct: Diameter: 6 mm Liver: No focal lesion identified. Within normal limits in parenchymal echogenicity. Portal vein is patent on color Doppler imaging with normal direction of blood flow towards the liver. Other: Trace perihepatic fluid. IMPRESSION: 1. Distended gallbladder with wall thickening and dependent stones and sludge as well as a small amount of pericholecystic fluid. Findings are similar to the prior ultrasound and support acute cholecystitis in the proper clinical setting. Electronically Signed   By: Lajean Manes M.D.   On: 01/15/2021 14:13    EKG: Atrial flutter  Weights: Filed Weights   01/16/21 0452 01/19/21 0551 01/20/21 0500  Weight: 80 kg 88.7 kg 88.5 kg     Physical Exam: Blood pressure (!) 96/56, pulse (!) 144, temperature 97.8 F (36.6 C), resp. rate (!) 24, height 5\' 3"  (1.6 m), weight 88.5 kg, SpO2 98 %. Body mass index is 34.56 kg/m. General: Well developed, well nourished, in no acute distress. Head eyes ears nose throat: Normocephalic, atraumatic, sclera non-icteric, no xanthomas, nares are without discharge. No apparent thyromegaly and/or mass  Lungs: Normal respiratory effort.  no wheezes, no rales, no rhonchi.  Heart: atrial flutter rate  145 bpm with normal S1 S2. no murmur gallop, no rub, PMI is normal size and placement, carotid upstroke normal without bruit, jugular venous pressure is normal Abdomen: Soft, non-tender, non-distended with normoactive bowel sounds. No hepatomegaly. No rebound/guarding. No obvious abdominal masses. Abdominal aorta is normal size without bruit Extremities: No edema. no cyanosis, no clubbing, no ulcers  Peripheral : 2+ bilateral upper extremity pulses, 2+ bilateral femoral pulses, 2+ bilateral dorsal pedal pulse Neuro: Alert and oriented. No facial asymmetry. No focal deficit. Moves all extremities spontaneously. Musculoskeletal: Normal muscle tone without kyphosis Psych:  Responds to questions appropriately with a normal affect.    Assessment: 80 year old female with an extensive past cardiac history of aortic atherosclerosis, coronary atherosclerosis of vein bypass graft, chronic DVT on Xarelto, HFpEF, history of aortic and mitral valve replacements with bioprosthetic valves currently admitted with acute cholecystitis and sepsis with episodes of atrial flutter/SVT over the last several hours likely secondary to acute illness.  Plan: -Continue Cardizem infusion and daily metoprolol for heart rate control with atrial flutter -add oral diltiazem 120 mg BID for better heart rate control and possible conversion to normal sinus rhythm while she recovers from her acute illness -Continue anticoagulation with Xarelto  -Continue nightly use of CPAP for treatment obstructive sleep apnea -Continue supportive care measures and management of acute cholecystitis and sepsis.   Signed, Jettie Booze PA-C [facility] 01/20/2021, 9:04 AM  The patient has been interviewed and examined. I agree with assessment and plan above. Serafina Royals MD [facility]

## 2021-01-20 NOTE — Progress Notes (Signed)
*  PRELIMINARY RESULTS* Echocardiogram 2D Echocardiogram has been performed.  Sherrie Sport 01/20/2021, 11:14 AM

## 2021-01-20 NOTE — Care Management Important Message (Signed)
Important Message  Patient Details  Name: Gina Chambers MRN: [id number] Date of Birth: 04-10-1941   Medicare Important Message Given:  Yes     Dannette Barbara 01/20/2021, 4:06 PM

## 2021-01-20 NOTE — Progress Notes (Addendum)
[facility] SURGICAL PROGRESS NOTE (cpt [id number])  Hospital Day(s): 5.  Interval History:  Patient seen and examined Overnight, she appears to have had issues with tachycardia, SVT, as high as 152 bpm. ECG appeared to show 2:1 atrial flutter. She was given Cardizem boluses x2 along with IV metoprolol. LE [place] pending. This morning, patient reports she continues to have generalized abdominal discomfort, but "not as bad as before". Still with decreased appetite. Labs overnight show relatively unchanged leukocytosis to 22.3K. Renal function remains normal; sCr - 0.86; UO - 300 ccs + unmeasured. She does have some hyperbilirubinemia to 1.7. Percutaneous cholecystostomy tube placed with IR on 09/26; output 120 ccs and bilious. Cx from this growing enterococcus gallinarum; susceptibilities pending. She continues on Zosyn. On carb modified diet; but again not eating well  Review of Systems:  Constitutional: denies fever, chills, + decreased appetite  HEENT: denies cough or congestion  Respiratory: denies any shortness of breath  Cardiovascular: denies chest pain or palpitations  Gastrointestinal: + abdominal pain, + nausea, denied emesis Genitourinary: denies burning with urination or urinary frequency    Vital signs in last 24 hours: [min-max] current  Temp:  [97.8 F (36.6 C)-98.5 F (36.9 C)] 97.9 F (36.6 C) (09/30 0530) Pulse Rate:  [81-145] 145 (09/30 0530) Resp:  [16-23] 22 (09/30 0530) BP: (95-125)/(49-73) 116/60 (09/30 0530) SpO2:  [92 %-100 %] 94 % (09/30 0530)     Height: 5\' 3"  (160 cm) Weight: 88.7 kg BMI (Calculated): 34.65   Intake/Output last 2 shifts:  09/29 0701 - 09/30 0700 In: -  Out: 22 [Urine:300; Drains:120]   Physical Exam:  Constitutional: alert, cooperative and no distress  HENT: normocephalic without obvious abnormality  Eyes: PERRL, EOM's grossly intact and symmetric  Respiratory: breathing non-labored at rest; on [place] Cardiovascular: Sinus  tachycardia to 143 Gastrointestinal: soft, obese, tenderness primarily over drain site in RUQ, she is distended and tympanic, no rebound/guarding. Cholecystostomy tube in the RUQ with bilious output, some serosanguinous drainage around the tube Musculoskeletal: no edema or wounds, motor and sensation grossly intact, NT   Labs:  CBC Latest Ref Rng & Units 01/19/2021 01/19/2021 01/18/2021  WBC 4.0 - 10.5 K/uL 22.3(H) 21.2(H) 22.2(H)  Hemoglobin 12.0 - 15.0 g/dL 11.8(L) 12.0 11.5(L)  Hematocrit 36.0 - 46.0 % 33.5(L) 37.4 35.0(L)  Platelets 150 - 400 K/uL 146(L) 140(L) 122(L)   CMP Latest Ref Rng & Units 01/19/2021 01/19/2021 01/18/2021  Glucose 70 - 99 mg/dL 133(H) 124(H) 124(H)  BUN 8 - 23 mg/dL 22 20 26(H)  Creatinine 0.44 - 1.00 mg/dL 0.86 0.80 1.00  Sodium 135 - 145 mmol/L 128(L) 132(L) 127(L)  Potassium 3.5 - 5.1 mmol/L 4.1 3.7 4.3  Chloride 98 - 111 mmol/L 93(L) 98 97(L)  CO2 22 - 32 mmol/L 26 25 24   Calcium 8.9 - 10.3 mg/dL 8.1(L) 8.5(L) 8.6(L)  Total Protein 6.5 - 8.1 g/dL 5.6(L) 5.5(L) 5.6(L)  Total Bilirubin 0.3 - 1.2 mg/dL 1.7(H) 1.2 1.0  Alkaline Phos 38 - 126 U/L 80 76 92  AST 15 - 41 U/L 24 14(L) 15  ALT 0 - 44 U/L 10 12 13      Imaging studies: No new pertinent imaging studies   Assessment/Plan: (ICD-10's: K81.0) 80 y.o. female with leukocytosis, admitted with recurrent cholecystitis s/p percutaneous cholecystostomy tube placement on 09/98, complicated by numerous pertinent comorbidities.   - Okay to continue diet as tolerated from surgical perspective as long as no interventions planned on other services  - Continue percutaneous cholecystostomy tube;  monitor and record output; flush daily with 5 ccs NS. She will need to maintain this for at least 6-8 weeks - Continue IV Abx (Zosyn); follow up Cx; growing enterococcus gallinarum; susceptibilities pending; recommend completely 14 days total (PO + IV)             - Monitor leukocytosis; stable but remains elevated              - Monitor renal function; improved             - Monitor abdominal examination              - Pain control prn; antiemetics prn             - Mobilization encouraged; engage therapies if needed   - Palliative care consultation is reasonable  - Further management per primary service  - General surgery will follow peripherally for now, please call with questions/concerns  All of the above findings and recommendations were discussed with the patient, patient's family (Daughter on phone), and the medical team, and all of patient's and family's questions were answered to their expressed satisfaction.  -- Edison Simon, PA-C [facility] 01/20/2021, 7:37 AM [phone] M-F: 7am - 4pm

## 2021-01-20 NOTE — Progress Notes (Signed)
   01/20/21 0210  Assess: MEWS Score  Temp 98.3 F (36.8 C)  BP 100/72  ECG Heart Rate (!) 143  Resp 20  Level of Consciousness Alert  SpO2 95 %  O2 Device CPAP  Assess: MEWS Score  MEWS Temp 0  MEWS Systolic 1  MEWS Pulse 3  MEWS RR 0  MEWS LOC 0  MEWS Score 4  MEWS Score Color Red  Assess: if the MEWS score is Yellow or Red  Were vital signs taken at a resting state? Yes  Focused Assessment No change from prior assessment  Does the patient meet 2 or more of the SIRS criteria? No  MEWS guidelines implemented *See Row Information* Yes  Treat  MEWS Interventions Administered scheduled meds/treatments  Pain Scale 0-10  Pain Score 0  Take Vital Signs  Increase Vital Sign Frequency  Red: Q 1hr X 4 then Q 4hr X 4, if remains red, continue Q 4hrs  Escalate  MEWS: Escalate Red: discuss with charge nurse/RN and provider, consider discussing with RRT  Notify: Charge Nurse/RN  Name of Charge Nurse/RN Notified Hilbert Corrigan RN  Date Charge Nurse/RN Notified 01/20/21  Time Charge Nurse/RN Notified 0210  Document  Progress note created (see row info) Yes  Assess: SIRS CRITERIA  SIRS Temperature  0  SIRS Pulse 1  SIRS Respirations  0  SIRS WBC 1  SIRS Score Sum  2  Patient transferred from med surg unit because of new onset of Afib RVR.  Cardizem bolus and gtt ordered.  Sharion Settler NP is aware of change and initiated transfer.  Patients HR remains in the 140's.  Cardizem gtt will be started now.

## 2021-01-20 NOTE — Progress Notes (Signed)
   01/20/21 2225  Assess: MEWS Score  Temp 97.6 F (36.4 C)  BP 121/66  ECG Heart Rate (!) 137  Resp 18  Level of Consciousness Alert  SpO2 100 %  O2 Device Nasal Cannula  O2 Flow Rate (L/min) 2 L/min  Assess: MEWS Score  MEWS Temp 0  MEWS Systolic 0  MEWS Pulse 3  MEWS RR 0  MEWS LOC 0  MEWS Score 3  MEWS Score Color Yellow  Assess: if the MEWS score is Yellow or Red  Were vital signs taken at a resting state? Yes  Focused Assessment No change from prior assessment  Does the patient meet 2 or more of the SIRS criteria? No  Does the patient have a confirmed or suspected source of infection? Yes  Provider and Rapid Response Notified? Yes  MEWS guidelines implemented *See Row Information* No, other (Comment) (previously yellow in last 24 hours, provider came to see pt and HR already decreased)  Treat  MEWS Interventions Administered scheduled meds/treatments;Escalated (See documentation below)  Escalate  MEWS: Escalate Yellow: discuss with charge nurse/RN and consider discussing with provider and RRT  Notify: Charge Nurse/RN  Name of Charge Nurse/RN Notified Ria Comment RN  Date Charge Nurse/RN Notified 01/20/21  Time Charge Nurse/RN Notified 2230  Notify: Provider  Provider Name/Title Rachael Fee  Date Provider Notified 01/20/21  Time Provider Notified 2228  Notification Type Page  Notification Reason Change in status  Provider response See new orders;En route  Date of Provider Response 01/20/21  Time of Provider Response 2231  Assess: SIRS CRITERIA  SIRS Temperature  0  SIRS Pulse 1  SIRS Respirations  0  SIRS WBC 0  SIRS Score Sum  1

## 2021-01-20 NOTE — Progress Notes (Signed)
PROGRESS NOTE    Gina Chambers  RFF:[id number] DOB: 02-17-1941 DOA: 01/15/2021 PCP: Kirk Ruths, MD   Chief complaint.  Abdominal pain. Brief Narrative:  80 y.o. female with medical history significant of hypertension, diabetes mellitus, COPD, asthma, GERD, hypothyroidism, OSA on CPAP, DVT on Xarelto, dCHF, vertigo, GI bleeding, COVID-19 infection, cholecystitis, who presents with abdominal pain.  She is diagnosed with cholecystitis with cholelithiasis.  Cholecystostomy tube was placed on 9/26, antibiotics were continued on Zosyn. Patient developed atrial flutter with rapid ventricular response 9/30, seen by cardiology, given diltiazem, Toprol and digoxin.   Assessment & Plan:   Principal Problem:   Acute cholecystitis Active Problems:   Leukocytosis   Generalized weakness   Acquired hypothyroidism   Essential hypertension   Type 2 diabetes mellitus without complication (HCC)   Hyponatremia   COPD (chronic obstructive pulmonary disease) (HCC)   DVT (deep venous thrombosis) (HCC)   OSA on CPAP   Chronic diastolic CHF (congestive heart failure) (HCC)   Sepsis (HCC)   Right shoulder pain  Sepsis secondary to acute cholecystitis. Acute cholecystitis with cholelithiasis. Patient condition had improved, she is status post cholecystostomy tube on 9/26. Per general surgery, will keep the cholecystostomy tube for 6 to 8 weeks. Continue antibiotics with Zosyn. Plan to transfer to Augmentin when patient medical condition is more stable.  Paroxysmal atrial flutter with rapid major response. Chronic diastolic congestive heart failure. Essential hypertension Patient developed atrial flutter this morning with a rapid ventricle response.  Cardiology consult is obtained.  Patient is currently receiving diltiazem drip, received a 0.5 mg IV digoxin. Continue to monitor heart rate. Patient is also on Xarelto for DVT.  DVT. As above.  COPD. Obstruct sleep apnea. Condition is  stable, continue CPAP while asleep.  Hyponatremia. Hypokalemia. Sodium level 130, stable.  Continue replete potassium.  Type 2 diabetes. Continue current regimen.      DVT prophylaxis: Xarelto Code Status: DNR Family Communication:  Disposition Plan:    Status is: Inpatient  Remains inpatient appropriate because:IV treatments appropriate due to intensity of illness or inability to take PO and Inpatient level of care appropriate due to severity of illness  Dispo: The patient is from: Home              Anticipated d/c is to: Home              Patient currently is not medically stable to d/c.   Difficult to place patient No        I/O last 3 completed shifts: In: 5 [Other:5] Out: 3570 [Urine:3450; Drains:265] Total I/O In: 5 [Other:5] Out: 150 [Drains:150]     Consultants:  Cardiology, General surgery  Procedures: Cholecystostomy.  Antimicrobials: Zosyn.  Subjective Patient developed atrial flutter earlier this morning with a heart rate went up to 140-150.  On diltiazem drip, also received metoprolol and digoxin.  She does not feel worsening short of breath, she has no palpitation. Cholecystotomy tube is still in place and draining.  Patient has no abdominal pain or nausea vomiting.  No diarrhea constipation. No fever chills pain No dysuria hematuria.     Objective: Vitals:   01/20/21 0700 01/20/21 0800 01/20/21 0809 01/20/21 1153  BP: 95/63 (!) 96/56  126/77  Pulse: (!) 142 (!) 142 (!) 144 70  Resp: (!) 24 14 (!) 24 18  Temp:  97.8 F (36.6 C)  97.7 F (36.5 C)  TempSrc:      SpO2: 96% 98% 98% 99%  Weight:  Height:        Intake/Output Summary (Last 24 hours) at 01/20/2021 1202 Last data filed at 01/20/2021 1100 Gross per 24 hour  Intake 5 ml  Output 270 ml  Net -265 ml   Filed Weights   01/16/21 0452 01/19/21 0551 01/20/21 0500  Weight: 80 kg 88.7 kg 88.5 kg    Examination:  General exam: Appears calm and comfortable   Respiratory system: Clear to auscultation. Respiratory effort normal. Cardiovascular system: Irregular and tachycardiac.  No JVD, murmurs, rubs, gallops or clicks. No pedal edema. Gastrointestinal system: Abdomen is nondistended, soft and nontender. No organomegaly or masses felt. Normal bowel sounds heard. Central nervous system: Alert and oriented x2. No focal neurological deficits. Extremities: Symmetric 5 x 5 power. Skin: No rashes, lesions or ulcers Psychiatry: Mood & affect appropriate.     Data Reviewed: I have personally reviewed following labs and imaging studies  CBC: Recent Labs  Lab 01/17/21 0434 01/18/21 0757 01/19/21 0534 01/19/21 2231 01/20/21 0908  WBC 21.3* 22.2* 21.2* 22.3* 22.3*  NEUTROABS  --   --   --  13.0*  --   HGB 11.6* 11.5* 12.0 11.8* 12.4  HCT 35.6* 35.0* 37.4 33.5* 37.4  MCV 88.6 88.4 89.3 86.6 86.8  PLT 107* 122* 140* 146* 009   Basic Metabolic Panel: Recent Labs  Lab 01/17/21 0434 01/18/21 0431 01/19/21 0534 01/19/21 2231 01/20/21 0908  NA 133* 127* 132* 128* 130*  K 4.3 4.3 3.7 4.1 3.4*  CL 102 97* 98 93* 93*  CO2 23 24 25 26 28   GLUCOSE 138* 124* 124* 133* 176*  BUN 32* 26* 20 22 19   CREATININE 1.58* 1.00 0.80 0.86 0.76  CALCIUM 8.3* 8.6* 8.5* 8.1* 8.0*  MG  --   --   --  1.7  --    GFR: Estimated Creatinine Clearance: 61.1 mL/min (by C-G formula based on SCr of 0.76 mg/dL). Liver Function Tests: Recent Labs  Lab 01/17/21 0434 01/18/21 0431 01/19/21 0534 01/19/21 2231 01/20/21 0908  AST 20 15 14* 24 18  ALT 17 13 12 10 13   ALKPHOS 73 92 76 80 82  BILITOT 1.2 1.0 1.2 1.7* 1.3*  PROT 5.6* 5.6* 5.5* 5.6* 5.9*  ALBUMIN 2.6* 2.5* 2.3* 2.3* 2.5*   Recent Labs  Lab 01/15/21 1251  LIPASE 28   No results for input(s): AMMONIA in the last 168 hours. Coagulation Profile: Recent Labs  Lab 01/15/21 1907  INR 1.1   Cardiac Enzymes: No results for input(s): CKTOTAL, CKMB, CKMBINDEX, TROPONINI in the last 168 hours. BNP  (last 3 results) No results for input(s): PROBNP in the last 8760 hours. HbA1C: No results for input(s): HGBA1C in the last 72 hours. CBG: Recent Labs  Lab 01/19/21 1734 01/19/21 2120 01/20/21 0603 01/20/21 0807 01/20/21 1151  GLUCAP 140* 121* 164* 159* 169*   Lipid Profile: No results for input(s): CHOL, HDL, LDLCALC, TRIG, CHOLHDL, LDLDIRECT in the last 72 hours. Thyroid Function Tests: Recent Labs    01/19/21 2231  TSH 2.426   Anemia Panel: No results for input(s): VITAMINB12, FOLATE, FERRITIN, TIBC, IRON, RETICCTPCT in the last 72 hours. Sepsis Labs: Recent Labs  Lab 01/15/21 1907 01/15/21 2131  PROCALCITON 0.18  --   LATICACIDVEN 2.3* 1.3    Recent Results (from the past 240 hour(s))  Resp Panel by RT-PCR (Flu A&B, Covid) Nasopharyngeal Swab     Status: None   Collection Time: 01/15/21  2:41 PM   Specimen: Nasopharyngeal Swab; Nasopharyngeal(NP) swabs in vial  transport medium  Result Value Ref Range Status   SARS Coronavirus 2 by RT PCR NEGATIVE NEGATIVE Final    Comment: (NOTE) SARS-CoV-2 target nucleic acids are NOT DETECTED.  The SARS-CoV-2 RNA is generally detectable in upper respiratory specimens during the acute phase of infection. The lowest concentration of SARS-CoV-2 viral copies this assay can detect is 138 copies/mL. A negative result does not preclude SARS-Cov-2 infection and should not be used as the sole basis for treatment or other patient management decisions. A negative result may occur with  improper specimen collection/handling, submission of specimen other than nasopharyngeal swab, presence of viral mutation(s) within the areas targeted by this assay, and inadequate number of viral copies(<138 copies/mL). A negative result must be combined with clinical observations, patient history, and epidemiological information. The expected result is Negative.  Fact Sheet for Patients:  EntrepreneurPulse.com.au  Fact Sheet for  Healthcare Providers:  IncredibleEmployment.be  This test is no t yet approved or cleared by the [place] FDA and  has been authorized for detection and/or diagnosis of SARS-CoV-2 by FDA under an Emergency Use Authorization (EUA). This EUA will remain  in effect (meaning this test can be used) for the duration of the COVID-19 declaration under Section 564(b)(1) of the Act, 21 U.S.C.section 360bbb-3(b)(1), unless the authorization is terminated  or revoked sooner.       Influenza A by PCR NEGATIVE NEGATIVE Final   Influenza B by PCR NEGATIVE NEGATIVE Final    Comment: (NOTE) The Xpert Xpress SARS-CoV-2/FLU/RSV plus assay is intended as an aid in the diagnosis of influenza from Nasopharyngeal swab specimens and should not be used as a sole basis for treatment. Nasal washings and aspirates are unacceptable for Xpert Xpress SARS-CoV-2/FLU/RSV testing.  Fact Sheet for Patients: EntrepreneurPulse.com.au  Fact Sheet for Healthcare Providers: IncredibleEmployment.be  This test is not yet approved or cleared by the [place] FDA and has been authorized for detection and/or diagnosis of SARS-CoV-2 by FDA under an Emergency Use Authorization (EUA). This EUA will remain in effect (meaning this test can be used) for the duration of the COVID-19 declaration under Section 564(b)(1) of the Act, 21 U.S.C. section 360bbb-3(b)(1), unless the authorization is terminated or revoked.  Performed at [facility], [address] [place], [place] [address]   Culture, blood (x 2)     Status: None   Collection Time: 01/15/21  7:08 PM   Specimen: BLOOD  Result Value Ref Range Status   Specimen Description BLOOD BLOOD LEFT HAND  Final   Special Requests   Final    BOTTLES DRAWN AEROBIC ONLY Blood Culture adequate volume   Culture   Final    NO GROWTH 5 DAYS Performed at [facility], [address], [place],  [place] [address]    Report Status 01/20/2021 FINAL  Final  Culture, blood (x 2)     Status: None   Collection Time: 01/15/21  8:05 PM   Specimen: BLOOD  Result Value Ref Range Status   Specimen Description BLOOD BLOOD LEFT HAND  Final   Special Requests   Final    BOTTLES DRAWN AEROBIC ONLY Blood Culture adequate volume   Culture   Final    NO GROWTH 5 DAYS Performed at [facility], [address] [place], [place] [address]    Report Status 01/20/2021 FINAL  Final  Aerobic/Anaerobic Culture w Gram Stain (surgical/deep wound)     Status: None (Preliminary result)   Collection Time: 01/16/21  3:40 PM  Specimen: Gallbladder; Bile  Result Value Ref Range Status   Specimen Description   Final    GALL BLADDER Performed at [facility], [address] [place], [place] [address]    Special Requests   Final    Normal Performed at [facility], [address] [place], [place] [address]    Gram Stain   Final    NO SQUAMOUS EPITHELIAL CELLS SEEN FEW WBC SEEN FEW GRAM POSITIVE COCCI Performed at [facility], [place] [address] [place], [place] [address]    Culture   Final    FEW ENTEROCOCCUS GALLINARUM RARE ESCHERICHIA COLI NO ANAEROBES ISOLATED; CULTURE IN PROGRESS FOR 5 DAYS    Report Status PENDING  Incomplete   Organism ID, Bacteria ENTEROCOCCUS GALLINARUM  Final   Organism ID, Bacteria ESCHERICHIA COLI  Final      Susceptibility   Escherichia coli - MIC*    AMPICILLIN <=2 SENSITIVE Sensitive     CEFAZOLIN <=4 SENSITIVE Sensitive     CEFEPIME <=0.12 SENSITIVE Sensitive     CEFTAZIDIME <=1 SENSITIVE Sensitive     CEFTRIAXONE <=0.25 SENSITIVE Sensitive     CIPROFLOXACIN <=0.25 SENSITIVE Sensitive     GENTAMICIN <=1 SENSITIVE Sensitive     IMIPENEM <=0.25 SENSITIVE Sensitive     TRIMETH/SULFA <=20 SENSITIVE Sensitive     AMPICILLIN/SULBACTAM <=2 SENSITIVE Sensitive     PIP/TAZO <=4 SENSITIVE Sensitive     * RARE ESCHERICHIA COLI   Enterococcus  gallinarum - MIC*    AMPICILLIN <=2 SENSITIVE Sensitive     VANCOMYCIN RESISTANT Resistant     GENTAMICIN SYNERGY SENSITIVE Sensitive     LINEZOLID 1 SENSITIVE Sensitive     * FEW ENTEROCOCCUS GALLINARUM         Radiology Studies: CT ABDOMEN PELVIS WO CONTRAST  Result Date: 01/19/2021 CLINICAL DATA:  Abdominal distention. Cholecystitis status post percutaneous cholecystostomy tube placement. Continued leukocytosis, pain. EXAM: CT ABDOMEN AND PELVIS WITHOUT CONTRAST TECHNIQUE: Multidetector CT imaging of the abdomen and pelvis was performed following the standard protocol without IV contrast. COMPARISON:  01/15/2021 FINDINGS: Lower chest: New moderate right pleural effusion with compressive atelectasis/infiltrate in the right lower lobe. Hepatobiliary: Post percutaneous cholecystostomy tube placement. The gallbladder is decompressed. No biliary ductal dilatation. Nodular contours of the liver compatible with cirrhosis. Pancreas: No focal abnormality or ductal dilatation. Spleen: No focal abnormality.  Normal size. Adrenals/Urinary Tract: Perinephric stranding bilaterally, increasing since prior study. No hydronephrosis. Adrenal glands and urinary bladder grossly unremarkable. Stomach/Bowel: Postoperative changes in the stomach. No bowel obstruction or acute finding. Vascular/Lymphatic: Aortic atherosclerosis. No evidence of aneurysm or adenopathy. Reproductive: Uterus and adnexa unremarkable.  No mass. Other: Trace free fluid in the cul-de-sac. Increasing perihepatic ascites. Musculoskeletal: No acute bony abnormality. IMPRESSION: Interval percutaneous cholecystostomy tube placement with decompression of the gallbladder. Changes of cirrhosis.  Increasing perihepatic ascites. Moderate right pleural effusion with right lower lobe atelectasis or infiltrate. Electronically Signed   By: Rolm Baptise M.D.   On: 01/19/2021 10:30   US Venous Img Lower Bilateral (DVT)  Result Date: 01/20/2021 CLINICAL DATA:   Positive D-dimer.  History of DVT EXAM: BILATERAL LOWER EXTREMITY VENOUS DOPPLER ULTRASOUND TECHNIQUE: Gray-scale sonography with compression, as well as color and duplex ultrasound, were performed to evaluate the deep venous system(s) from the level of the common femoral vein through the popliteal and proximal calf veins. COMPARISON:  05/29/2016 right lower extremity Doppler ultrasound. FINDINGS: VENOUS Right: Normal compressibility of the common femoral, superficial femoral, and popliteal  veins, as well as the visualized calf veins. Visualized portions of profunda femoral vein and great saphenous vein unremarkable. No filling defects to suggest DVT on grayscale or color Doppler imaging. Doppler waveforms show normal direction of venous flow, normal respiratory plasticity and response to augmentation. Left: Peripheral mid level echoes with incomplete compressibility at the left popliteal vein. The other left lower extremity deep veins showed normal color filling and compressibility. Present respiratory phasicity. IMPRESSION: 1. Nonocclusive clot in the left popliteal vein that is likely nonacute. 2. Negative for right lower extremity DVT. Electronically Signed   By: Jorje Guild M.D.   On: 01/20/2021 06:28        Scheduled Meds:  aspirin EC  81 mg Oral QHS   bumetanide  1 mg Oral BID   digoxin  0.25 mg Intravenous Once   diltiazem  120 mg Oral BID   fluticasone  2 puff Inhalation BID   gabapentin  100 mg Oral QID   insulin aspart  0-5 Units Subcutaneous QHS   insulin aspart  0-9 Units Subcutaneous TID WC   insulin glargine-yfgn  10 Units Subcutaneous Daily   And   insulin glargine-yfgn  5 Units Subcutaneous QHS   ipratropium-albuterol  3 mL Nebulization Q6H   levothyroxine  125 mcg Oral Q0600   metoprolol succinate  50 mg Oral Daily   montelukast  10 mg Oral QHS   multivitamin with minerals   Oral Daily   pantoprazole  20 mg Oral Daily   potassium chloride  40 mEq Oral Once    rivaroxaban  20 mg Oral Q supper   sodium chloride flush  5 mL Intracatheter Q8H   Continuous Infusions:  diltiazem (CARDIZEM) infusion 15 mg/hr (01/20/21 0918)   piperacillin-tazobactam (ZOSYN)  IV 3.375 g (01/20/21 0730)     LOS: 5 days    Time spent: 36 minutes    Sharen Hones, MD [facility]   To contact the attending provider between 7A-7P or the covering provider during after hours 7P-7A, please log into the web site [email or web address] and access using universal [place] password for that web site. If you do not have the password, please call the hospital operator.  01/20/2021, 12:02 PM

## 2021-01-21 DIAGNOSIS — I4892 Unspecified atrial flutter: Secondary | ICD-10-CM | POA: Diagnosis not present

## 2021-01-21 DIAGNOSIS — K810 Acute cholecystitis: Secondary | ICD-10-CM | POA: Diagnosis not present

## 2021-01-21 DIAGNOSIS — I5032 Chronic diastolic (congestive) heart failure: Secondary | ICD-10-CM | POA: Diagnosis not present

## 2021-01-21 LAB — COMPREHENSIVE METABOLIC PANEL
ALT: 12 U/L (ref 0–44)
AST: 17 U/L (ref 15–41)
Albumin: 2.3 g/dL — ABNORMAL LOW (ref 3.5–5.0)
Alkaline Phosphatase: 90 U/L (ref 38–126)
Anion gap: 9 (ref 5–15)
BUN: 22 mg/dL (ref 8–23)
CO2: 28 mmol/L (ref 22–32)
Calcium: 8.3 mg/dL — ABNORMAL LOW (ref 8.9–10.3)
Chloride: 92 mmol/L — ABNORMAL LOW (ref 98–111)
Creatinine, Ser: 0.87 mg/dL (ref 0.44–1.00)
GFR, Estimated: >60 mL/min (ref >60)
Glucose, Bld: 140 mg/dL — ABNORMAL HIGH (ref 70–99)
Potassium: 3.9 mmol/L (ref 3.5–5.1)
Sodium: 129 mmol/L — ABNORMAL LOW (ref 135–145)
Total Bilirubin: 1 mg/dL (ref 0.3–1.2)
Total Protein: 5.6 g/dL — ABNORMAL LOW (ref 6.5–8.1)

## 2021-01-21 LAB — T4: T4, Total: 6.3 ug/dL (ref 4.5–12.0)

## 2021-01-21 LAB — AEROBIC/ANAEROBIC CULTURE W GRAM STAIN (SURGICAL/DEEP WOUND)
Gram Stain: NONE SEEN
Special Requests: NORMAL

## 2021-01-21 LAB — CBC WITH DIFFERENTIAL/PLATELET
Abs Immature Granulocytes: 0.87 10*3/uL — ABNORMAL HIGH (ref 0.00–0.07)
Basophils Absolute: 0.1 10*3/uL (ref 0.0–0.1)
Basophils Relative: 0 %
Eosinophils Absolute: 0.2 10*3/uL (ref 0.0–0.5)
Eosinophils Relative: 1 %
HCT: 33.6 % — ABNORMAL LOW (ref 36.0–46.0)
Hemoglobin: 11.3 g/dL — ABNORMAL LOW (ref 12.0–15.0)
Immature Granulocytes: 4 %
Lymphocytes Relative: 27 %
Lymphs Abs: 6.3 10*3/uL — ABNORMAL HIGH (ref 0.7–4.0)
MCH: 29.4 pg (ref 26.0–34.0)
MCHC: 33.6 g/dL (ref 30.0–36.0)
MCV: 87.3 fL (ref 80.0–100.0)
Monocytes Absolute: 2.2 10*3/uL — ABNORMAL HIGH (ref 0.1–1.0)
Monocytes Relative: 9 %
Neutro Abs: 13.6 10*3/uL — ABNORMAL HIGH (ref 1.7–7.7)
Neutrophils Relative %: 59 %
Platelets: 167 10*3/uL (ref 150–400)
RBC: 3.85 MIL/uL — ABNORMAL LOW (ref 3.87–5.11)
RDW: 14.8 % (ref 11.5–15.5)
Smear Review: NORMAL
WBC: 22.9 10*3/uL — ABNORMAL HIGH (ref 4.0–10.5)
nRBC: 0 % (ref 0.0–0.2)

## 2021-01-21 LAB — GLUCOSE, CAPILLARY
Glucose-Capillary: 149 mg/dL — ABNORMAL HIGH (ref 70–99)
Glucose-Capillary: 261 mg/dL — ABNORMAL HIGH (ref 70–99)
Glucose-Capillary: 218 mg/dL — ABNORMAL HIGH (ref 70–99)
Glucose-Capillary: 149 mg/dL — ABNORMAL HIGH (ref 70–99)

## 2021-01-21 LAB — MAGNESIUM: Magnesium: 2 mg/dL (ref 1.7–2.4)

## 2021-01-21 MED ORDER — METOPROLOL TARTRATE 5 MG/5ML IV SOLN: 5.0000 mg | Freq: Four times a day (QID) | INTRAVENOUS | Status: DC | PRN

## 2021-01-21 MED ORDER — SPIRONOLACTONE 25 MG PO TABS
25.0000 mg | ORAL_TABLET | Freq: Every day | ORAL | Status: DC
  Administered 2021-01-21 – 2021-01-31 (×11): 25 mg via ORAL
  Filled 2021-01-21 (×11): qty 1

## 2021-01-21 MED ORDER — TORSEMIDE 20 MG PO TABS
40.0000 mg | ORAL_TABLET | Freq: Two times a day (BID) | ORAL | Status: DC
  Administered 2021-01-21 – 2021-01-31 (×21): 40 mg via ORAL
  Filled 2021-01-21 (×21): qty 2

## 2021-01-21 MED ORDER — FUROSEMIDE 40 MG PO TABS
40.0000 mg | ORAL_TABLET | Freq: Two times a day (BID) | ORAL | Status: DC
  Filled 2021-01-21: qty 1

## 2021-01-21 MED ORDER — FUROSEMIDE 40 MG PO TABS: 40.0000 mg | ORAL_TABLET | Freq: Every day | ORAL | Status: DC

## 2021-01-21 NOTE — Progress Notes (Signed)
PROGRESS NOTE    Gina Chambers  GGE:[id number] DOB: 06/29/1940 DOA: 01/15/2021 PCP: Kirk Ruths, MD   Chief complaint.  Abdominal pain. Brief Narrative:  80 y.o. female with medical history significant of hypertension, diabetes mellitus, COPD, asthma, GERD, hypothyroidism, OSA on CPAP, DVT on Xarelto, dCHF, vertigo, GI bleeding, COVID-19 infection, cholecystitis, who presents with abdominal pain.  She is diagnosed with cholecystitis with cholelithiasis.  Cholecystostomy tube was placed on 9/26, antibiotics were continued on Zosyn. Patient developed atrial flutter with rapid ventricular response 9/30, seen by cardiology, given diltiazem, Toprol and digoxin.   Assessment & Plan:   Principal Problem:   Acute cholecystitis Active Problems:   Leukocytosis   Generalized weakness   Acquired hypothyroidism   Essential hypertension   Type 2 diabetes mellitus without complication (HCC)   Hyponatremia   COPD (chronic obstructive pulmonary disease) (HCC)   DVT (deep venous thrombosis) (HCC)   OSA on CPAP   Chronic diastolic CHF (congestive heart failure) (HCC)   Sepsis (HCC)   Right shoulder pain   Paroxysmal atrial flutter (HCC)    Sepsis secondary to acute cholecystitis. Acute cholecystitis with cholelithiasis. Persistent leukocytosis Patient is status post cholecystostomy tube placement.  Planning to keep the tube for 6 to 8 weeks. Antibiotic switched to Unasyn. Probably will change to Augmentin at time of discharge for full course of 14 days. Patient still has significant leukocytosis, but feels like infection and control.  Will obtain a procalcitonin level tomorrow.  Paroxysmal atrial flutter with rapid major response. Chronic diastolic congestive heart failure. Essential hypertension Patient still has rapid ventricular rate today, appreciate cardiology consult. Diltiazem IV drip was restarted last night, heart rate is still fast.  Continue metoprolol orally.  Also  added metoprolol 5 mg IV as needed. Continue Xarelto.  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 Hypokalemia. Patient currently on bumetanide 1 mg twice a day, I will change to furosemide as it may help to increase sodium level, added Aldactone 25 mg daily.  DVT. Continue Xarelto.  Type 2 diabetes. Continue current regimen.  COPD. Obstructive sleep apnea. Conditions are stable, continue CPAP for sleep.   Goal of care discussion: Had a long discussion with the patient, she feels that her condition is improving, she is not ready to give up.  At this point, we will continue treatment. Also updated daughter.       DVT prophylaxis: Xarelto Code Status: DNR Family Communication: Updated daughter and patient. Disposition Plan:    Status is: Inpatient  Remains inpatient appropriate because:IV treatments appropriate due to intensity of illness or inability to take PO and Inpatient level of care appropriate due to severity of illness  Dispo: The patient is from: Home              Anticipated d/c is to: Home              Patient currently is not medically stable to d/c.   Difficult to place patient No        I/O last 3 completed shifts: In: 936.9 [P.O.:480; I.V.:241.9; Other:15; IV Piggyback:200] Out: 1180 [Urine:850; Drains:330] No intake/output data recorded.     Consultants:  Cardiology, general surgery  Procedures:   Antimicrobials: Unasyn  Subjective: Patient feels better today, she is no longer confused.  She slept well last night. She states that she started eating yesterday, no nausea vomiting.  She still has some abdominal distention. She does not feel short of breath, no  cough. No fever or chills. No dysuria hematuria.  Objective: Vitals:   01/21/21 0200 01/21/21 0400 01/21/21 0500 01/21/21 0930  BP: (!) 98/50 (!) 118/43  119/75  Pulse: 75 (!) 52  98  Resp: 15 17  20   Temp:    97.7 F (36.5 C)   TempSrc:      SpO2: 99% 98%  97%  Weight:   91.7 kg   Height:        Intake/Output Summary (Last 24 hours) at 01/21/2021 1036 Last data filed at 01/21/2021 0600 Gross per 24 hour  Intake 936.85 ml  Output 1180 ml  Net -243.15 ml   Filed Weights   01/19/21 0551 01/20/21 0500 01/21/21 0500  Weight: 88.7 kg 88.5 kg 91.7 kg    Examination:  General exam: Appears calm and comfortable  Respiratory system: Clear to auscultation. Respiratory effort normal. Cardiovascular system: S1 & S2 heard, RRR. No JVD, murmurs, rubs, gallops or clicks.  Gastrointestinal system: Abdomen is distended, soft and nontender. No organomegaly or masses felt. Normal bowel sounds heard. Central nervous system: Alert and oriented x3. No focal neurological deficits. Extremities: 2+ leg edema Skin: No rashes, lesions or ulcers Psychiatry: Mood & affect appropriate.     Data Reviewed: I have personally reviewed following labs and imaging studies  CBC: Recent Labs  Lab 01/18/21 0757 01/19/21 0534 01/19/21 2231 01/20/21 0908 01/21/21 0530  WBC 22.2* 21.2* 22.3* 22.3* 22.9*  NEUTROABS  --   --  13.0*  --  13.6*  HGB 11.5* 12.0 11.8* 12.4 11.3*  HCT 35.0* 37.4 33.5* 37.4 33.6*  MCV 88.4 89.3 86.6 86.8 87.3  PLT 122* 140* 146* 155 505   Basic Metabolic Panel: Recent Labs  Lab 01/18/21 0431 01/19/21 0534 01/19/21 2231 01/20/21 0908 01/21/21 0530  NA 127* 132* 128* 130* 129*  K 4.3 3.7 4.1 3.4* 3.9  CL 97* 98 93* 93* 92*  CO2 24 25 26 28 28   GLUCOSE 124* 124* 133* 176* 140*  BUN 26* 20 22 19 22   CREATININE 1.00 0.80 0.86 0.76 0.87  CALCIUM 8.6* 8.5* 8.1* 8.0* 8.3*  MG  --   --  1.7  --  2.0   GFR: Estimated Creatinine Clearance: 57.3 mL/min (by C-G formula based on SCr of 0.87 mg/dL). Liver Function Tests: Recent Labs  Lab 01/18/21 0431 01/19/21 0534 01/19/21 2231 01/20/21 0908 01/21/21 0530  AST 15 14* 24 18 17   ALT 13 12 10 13 12   ALKPHOS 92 76 80 82 90  BILITOT 1.0 1.2 1.7*  1.3* 1.0  PROT 5.6* 5.5* 5.6* 5.9* 5.6*  ALBUMIN 2.5* 2.3* 2.3* 2.5* 2.3*   Recent Labs  Lab 01/15/21 1251  LIPASE 28   No results for input(s): AMMONIA in the last 168 hours. Coagulation Profile: Recent Labs  Lab 01/15/21 1907  INR 1.1   Cardiac Enzymes: No results for input(s): CKTOTAL, CKMB, CKMBINDEX, TROPONINI in the last 168 hours. BNP (last 3 results) No results for input(s): PROBNP in the last 8760 hours. HbA1C: No results for input(s): HGBA1C in the last 72 hours. CBG: Recent Labs  Lab 01/20/21 0807 01/20/21 1151 01/20/21 1650 01/20/21 2020 01/21/21 0748  GLUCAP 159* 169* 161* 164* 149*   Lipid Profile: No results for input(s): CHOL, HDL, LDLCALC, TRIG, CHOLHDL, LDLDIRECT in the last 72 hours. Thyroid Function Tests: Recent Labs    01/19/21 2231  TSH 2.426  T4TOTAL 6.3   Anemia Panel: No results for input(s): VITAMINB12, FOLATE, FERRITIN, TIBC, IRON, RETICCTPCT in  the last 72 hours. Sepsis Labs: Recent Labs  Lab 01/15/21 1907 01/15/21 2131  PROCALCITON 0.18  --   LATICACIDVEN 2.3* 1.3    Recent Results (from the past 240 hour(s))  Resp Panel by RT-PCR (Flu A&B, Covid) Nasopharyngeal Swab     Status: None   Collection Time: 01/15/21  2:41 PM   Specimen: Nasopharyngeal Swab; Nasopharyngeal(NP) swabs in vial transport medium  Result Value Ref Range Status   SARS Coronavirus 2 by RT PCR NEGATIVE NEGATIVE Final    Comment: (NOTE) SARS-CoV-2 target nucleic acids are NOT DETECTED.  The SARS-CoV-2 RNA is generally detectable in upper respiratory specimens during the acute phase of infection. The lowest concentration of SARS-CoV-2 viral copies this assay can detect is 138 copies/mL. A negative result does not preclude SARS-Cov-2 infection and should not be used as the sole basis for treatment or other patient management decisions. A negative result may occur with  improper specimen collection/handling, submission of specimen other than  nasopharyngeal swab, presence of viral mutation(s) within the areas targeted by this assay, and inadequate number of viral copies(<138 copies/mL). A negative result must be combined with clinical observations, patient history, and epidemiological information. The expected result is Negative.  Fact Sheet for Patients:  EntrepreneurPulse.com.au  Fact Sheet for Healthcare Providers:  IncredibleEmployment.be  This test is no t yet approved or cleared by the [place] FDA and  has been authorized for detection and/or diagnosis of SARS-CoV-2 by FDA under an Emergency Use Authorization (EUA). This EUA will remain  in effect (meaning this test can be used) for the duration of the COVID-19 declaration under Section 564(b)(1) of the Act, 21 U.S.C.section 360bbb-3(b)(1), unless the authorization is terminated  or revoked sooner.       Influenza A by PCR NEGATIVE NEGATIVE Final   Influenza B by PCR NEGATIVE NEGATIVE Final    Comment: (NOTE) The Xpert Xpress SARS-CoV-2/FLU/RSV plus assay is intended as an aid in the diagnosis of influenza from Nasopharyngeal swab specimens and should not be used as a sole basis for treatment. Nasal washings and aspirates are unacceptable for Xpert Xpress SARS-CoV-2/FLU/RSV testing.  Fact Sheet for Patients: EntrepreneurPulse.com.au  Fact Sheet for Healthcare Providers: IncredibleEmployment.be  This test is not yet approved or cleared by the [place] FDA and has been authorized for detection and/or diagnosis of SARS-CoV-2 by FDA under an Emergency Use Authorization (EUA). This EUA will remain in effect (meaning this test can be used) for the duration of the COVID-19 declaration under Section 564(b)(1) of the Act, 21 U.S.C. section 360bbb-3(b)(1), unless the authorization is terminated or revoked.  Performed at [facility], [address] [place], [place]  [address]   Culture, blood (x 2)     Status: None   Collection Time: 01/15/21  7:08 PM   Specimen: BLOOD  Result Value Ref Range Status   Specimen Description BLOOD BLOOD LEFT HAND  Final   Special Requests   Final    BOTTLES DRAWN AEROBIC ONLY Blood Culture adequate volume   Culture   Final    NO GROWTH 5 DAYS Performed at [facility], [address] [place], [place] [address]    Report Status 01/20/2021 FINAL  Final  Culture, blood (x 2)     Status: None   Collection Time: 01/15/21  8:05 PM   Specimen: BLOOD  Result Value Ref Range Status   Specimen Description BLOOD BLOOD LEFT HAND  Final   Special Requests   Final  BOTTLES DRAWN AEROBIC ONLY Blood Culture adequate volume   Culture   Final    NO GROWTH 5 DAYS Performed at [facility], [address] [place], [place] [address]    Report Status 01/20/2021 FINAL  Final  Aerobic/Anaerobic Culture w Gram Stain (surgical/deep wound)     Status: None (Preliminary result)   Collection Time: 01/16/21  3:40 PM   Specimen: Gallbladder; Bile  Result Value Ref Range Status   Specimen Description   Final    GALL BLADDER Performed at [facility], [address] [place], [place] [address]    Special Requests   Final    Normal Performed at [facility], [address] [place], [place] [address]    Gram Stain   Final    NO SQUAMOUS EPITHELIAL CELLS SEEN FEW WBC SEEN FEW GRAM POSITIVE COCCI Performed at [facility], [place] [address] [place],  [address]    Culture   Final    FEW ENTEROCOCCUS GALLINARUM RARE ESCHERICHIA COLI NO ANAEROBES ISOLATED; CULTURE IN PROGRESS FOR 5 DAYS    Report Status PENDING  Incomplete   Organism ID, Bacteria ENTEROCOCCUS GALLINARUM  Final   Organism ID, Bacteria ESCHERICHIA COLI  Final      Susceptibility   Escherichia coli - MIC*    AMPICILLIN <=2 SENSITIVE Sensitive     CEFAZOLIN <=4 SENSITIVE Sensitive     CEFEPIME <=0.12 SENSITIVE  Sensitive     CEFTAZIDIME <=1 SENSITIVE Sensitive     CEFTRIAXONE <=0.25 SENSITIVE Sensitive     CIPROFLOXACIN <=0.25 SENSITIVE Sensitive     GENTAMICIN <=1 SENSITIVE Sensitive     IMIPENEM <=0.25 SENSITIVE Sensitive     TRIMETH/SULFA <=20 SENSITIVE Sensitive     AMPICILLIN/SULBACTAM <=2 SENSITIVE Sensitive     PIP/TAZO <=4 SENSITIVE Sensitive     * RARE ESCHERICHIA COLI   Enterococcus gallinarum - MIC*    AMPICILLIN <=2 SENSITIVE Sensitive     VANCOMYCIN RESISTANT Resistant     GENTAMICIN SYNERGY SENSITIVE Sensitive     LINEZOLID 1 SENSITIVE Sensitive     * FEW ENTEROCOCCUS GALLINARUM         Radiology Studies: US Venous Img Lower Bilateral (DVT)  Result Date: 01/20/2021 CLINICAL DATA:  Positive D-dimer.  History of DVT EXAM: BILATERAL LOWER EXTREMITY VENOUS DOPPLER ULTRASOUND TECHNIQUE: Gray-scale sonography with compression, as well as color and duplex ultrasound, were performed to evaluate the deep venous system(s) from the level of the common femoral vein through the popliteal and proximal calf veins. COMPARISON:  05/29/2016 right lower extremity Doppler ultrasound. FINDINGS: VENOUS Right: Normal compressibility of the common femoral, superficial femoral, and popliteal veins, as well as the visualized calf veins. Visualized portions of profunda femoral vein and great saphenous vein unremarkable. No filling defects to suggest DVT on grayscale or color Doppler imaging. Doppler waveforms show normal direction of venous flow, normal respiratory plasticity and response to augmentation. Left: Peripheral mid level echoes with incomplete compressibility at the left popliteal vein. The other left lower extremity deep veins showed normal color filling and compressibility. Present respiratory phasicity. IMPRESSION: 1. Nonocclusive clot in the left popliteal vein that is likely nonacute. 2. Negative for right lower extremity DVT. Electronically Signed   By: Jorje Guild M.D.   On: 01/20/2021  06:28   ECHOCARDIOGRAM COMPLETE  Result Date: 01/20/2021    ECHOCARDIOGRAM REPORT   Patient Name:   JENNICA TAGLIAFERRI Date of Exam: 01/20/2021 Medical Rec #:  [id number]  Height:       63.0 in Accession #:    [id number]    Weight:       195.1 lb Date of Birth:  04-17-1941    BSA:          1.913 m Patient Age:    9 years      BP:           96/56 mmHg Patient Gender: F             HR:           144 bpm. Exam Location:  [facility] Procedure: 2D Echo, Cardiac Doppler and Color Doppler Indications:     Atrial Flutter I48.92  History:         Patient has prior history of Echocardiogram examinations, most                  recent 09/26/2020. CHF; Risk Factors:Diabetes. DVT.                   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  Sonographer:     Sherrie Sport Referring Phys:  [id number] BRENDA MORRISON Diagnosing Phys: Serafina Royals MD IMPRESSIONS  1. Left ventricular ejection fraction, by estimation, is 65 to 70%. The left ventricle has normal function. The left ventricle has no regional wall motion abnormalities. Left ventricular diastolic parameters were normal.  2. Right ventricular systolic function is normal. The right ventricular size is normal.  3. Left atrial size was moderately dilated.  4. The mitral valve has been repaired/replaced. Trivial mitral valve regurgitation. 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 Echo findings are consistent with normal structure and function of the mitral valve prosthesis.  5. The aortic valve is normal in structure. Aortic valve regurgitation is trivial. FINDINGS  Left Ventricle: Left ventricular ejection fraction, by estimation, is 65 to 70%. The left ventricle has normal function. The left ventricle has no regional wall motion abnormalities. The left ventricular internal cavity size was small. There is no left ventricular hypertrophy. Left ventricular diastolic parameters were normal. Right Ventricle: The right  ventricular size is normal. No increase in right ventricular wall thickness. Right ventricular systolic function is normal. Left Atrium: Left atrial size was moderately dilated. Right Atrium: Right atrial size was normal in size. Pericardium: There is no evidence of pericardial effusion. Mitral Valve: The mitral valve has been repaired/replaced. Trivial mitral valve regurgitation. 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 Echo findings are consistent with normal structure and function of the mitral valve prosthesis. MV peak gradient, 13.0 mmHg. The mean mitral valve gradient is 6.0 mmHg. Tricuspid Valve: The tricuspid valve is normal in structure. Tricuspid valve regurgitation is trivial. Aortic Valve: The aortic valve is normal in structure. Aortic valve regurgitation is trivial. Aortic valve mean gradient measures 17.3 mmHg. Aortic valve peak gradient measures 30.8 mmHg. Aortic valve area, by VTI measures 1.48 cm. Pulmonic Valve: The pulmonic valve was normal in structure. Pulmonic valve regurgitation is not visualized. Aorta: The aortic root and ascending aorta are structurally normal, with no evidence of dilitation. IAS/Shunts: No atrial level shunt detected by color flow Doppler.  LEFT VENTRICLE PLAX 2D LVIDd:         3.50 cm LVIDs:         2.50 cm LV PW:  1.10 cm LV IVS:        1.40 cm LVOT diam:     2.00 cm LV SV:         71 LV SV Index:   37 LVOT Area:     3.14 cm  RIGHT VENTRICLE RV Basal diam:  3.50 cm RV S prime:     14.80 cm/s TAPSE (M-mode): 3.5 cm LEFT ATRIUM             Index       RIGHT ATRIUM           Index LA diam:        2.80 cm 1.46 cm/m  RA Area:     15.60 cm LA Vol (A2C):   90.8 ml 47.45 ml/m RA Volume:   42.60 ml  22.26 ml/m LA Vol (A4C):   78.1 ml 40.82 ml/m LA Biplane Vol: 84.1 ml 43.95 ml/m  AORTIC VALVE                    PULMONIC VALVE AV Area (Vmax):    1.78 cm     PV Vmax:        0.30 m/s AV Area (Vmean):   1.99 cm     PV Peak grad:   0.4 mmHg AV  Area (VTI):     1.48 cm     RVOT Peak grad: 3 mmHg AV Vmax:           277.67 cm/s AV Vmean:          191.333 cm/s AV VTI:            0.478 m AV Peak Grad:      30.8 mmHg AV Mean Grad:      17.3 mmHg LVOT Vmax:         157.00 cm/s LVOT Vmean:        121.000 cm/s LVOT VTI:          0.225 m LVOT/AV VTI ratio: 0.47  AORTA Ao Root diam: 2.00 cm MITRAL VALVE                TRICUSPID VALVE MV Area (PHT): 4.74 cm     TR Peak grad:   39.4 mmHg MV Area VTI:   1.76 cm     TR Vmax:        314.00 cm/s MV Peak grad:  13.0 mmHg MV Mean grad:  6.0 mmHg     SHUNTS MV Vmax:       1.80 m/s     Systemic VTI:  0.22 m MV Vmean:      110.7 cm/s   Systemic Diam: 2.00 cm MV Decel Time: 160 msec MV E velocity: 180.00 cm/s MV A velocity: 88.60 cm/s MV E/A ratio:  2.03 Serafina Royals MD Electronically signed by Serafina Royals MD Signature Date/Time: 01/20/2021/12:39:30 PM    Final         Scheduled Meds:  aspirin EC  81 mg Oral QHS   bumetanide  1 mg Oral BID   diltiazem  120 mg Oral BID   fluticasone  2 puff Inhalation BID   gabapentin  100 mg Oral QID   insulin aspart  0-5 Units Subcutaneous QHS   insulin aspart  0-9 Units Subcutaneous TID WC   insulin glargine-yfgn  10 Units Subcutaneous Daily   And   insulin glargine-yfgn  5 Units Subcutaneous QHS   levothyroxine  125 mcg Oral Q0600   metoprolol succinate  50  mg Oral Daily   montelukast  10 mg Oral QHS   multivitamin with minerals   Oral Daily   pantoprazole  20 mg Oral Daily   rivaroxaban  20 mg Oral Q supper   sodium chloride flush  5 mL Intracatheter Q8H   spironolactone  25 mg Oral Daily   Continuous Infusions:  sodium chloride     ampicillin-sulbactam (UNASYN) IV 3 g (01/21/21 0949)   diltiazem (CARDIZEM) infusion 5 mg/hr (01/21/21 0505)     LOS: 6 days    Time spent: 19 minutes    Sharen Hones, MD [facility]   To contact the attending provider between 7A-7P or the covering provider during after hours 7P-7A, please log into the web  site [email or web address] and access using universal [place] password for that web site. If you do not have the password, please call the hospital operator.  01/21/2021, 10:36 AM

## 2021-01-21 NOTE — Progress Notes (Signed)
[facility] Encounter Note  Patient: Gina Chambers / Admit Date: 01/15/2021 / Date of Encounter: 01/21/2021, 6:24 AM   Subjective: Overall patient slightly better than yesterday although still quite ill with further infection likely driving or atrial flutter with rapid ventricular rate.  Rate is variable at 80 to 120 bpm with additional medication management.  No current evidence of congestive heart failure or acute coronary syndrome  Echocardiogram showing normal LV systolic function with normally functioning prosthetic valves and ejection fraction of 60% Review of Systems: Positive for: Weakness fatigue Negative for: Vision change, hearing change, syncope, dizziness, nausea, vomiting,diarrhea, bloody stool, stomach pain, cough, congestion, diaphoresis, urinary frequency, urinary pain,skin lesions, skin rashes Others previously listed  Objective: Telemetry: Atrial flutter with variable heart rate Physical Exam: Blood pressure (!) 118/43, pulse (!) 52, temperature 97.6 F (36.4 C), temperature source Oral, resp. rate 17, height 5\' 3"  (1.6 m), weight 91.7 kg, SpO2 98 %. Body mass index is 35.81 kg/m. General: Well developed, well nourished, in no acute distress. Head: Normocephalic, atraumatic, sclera non-icteric, no xanthomas, nares are without discharge. Neck: No apparent masses Lungs: Normal respirations with no wheezes, no rhonchi, no rales , no crackles   Heart: Irregular trace rate and rhythm, normal S1 S2, no murmur, no rub, no gallop, PMI is normal size and placement, carotid upstroke normal without bruit, jugular venous pressure normal Abdomen: Soft, non-tender, non-distended with normoactive bowel sounds. No hepatosplenomegaly. Abdominal aorta is normal size without bruit Extremities: Trace edema, no clubbing, no cyanosis, no ulcers,  Peripheral: 2+ radial, 2+ femoral, 2+ dorsal pedal pulses Neuro: Alert and oriented. Moves all extremities  spontaneously. Psych:  Responds to questions appropriately with a normal affect.   Intake/Output Summary (Last 24 hours) at 01/21/2021 0624 Last data filed at 01/21/2021 0505 Gross per 24 hour  Intake 832.34 ml  Output 1180 ml  Net -347.66 ml    Inpatient Medications:  . aspirin EC  81 mg Oral QHS  . bumetanide  1 mg Oral BID  . diltiazem  120 mg Oral BID  . fluticasone  2 puff Inhalation BID  . gabapentin  100 mg Oral QID  . insulin aspart  0-5 Units Subcutaneous QHS  . insulin aspart  0-9 Units Subcutaneous TID WC  . insulin glargine-yfgn  10 Units Subcutaneous Daily   And  . insulin glargine-yfgn  5 Units Subcutaneous QHS  . ipratropium-albuterol  3 mL Nebulization Q6H  . levothyroxine  125 mcg Oral Q0600  . metoprolol succinate  50 mg Oral Daily  . montelukast  10 mg Oral QHS  . multivitamin with minerals   Oral Daily  . pantoprazole  20 mg Oral Daily  . rivaroxaban  20 mg Oral Q supper  . sodium chloride flush  5 mL Intracatheter Q8H   Infusions:  . sodium chloride    . ampicillin-sulbactam (UNASYN) IV 3 g (01/21/21 0500)  . diltiazem (CARDIZEM) infusion 5 mg/hr (01/21/21 0505)    Labs: Recent Labs    01/19/21 2231 01/20/21 0908  NA 128* 130*  K 4.1 3.4*  CL 93* 93*  CO2 26 28  GLUCOSE 133* 176*  BUN 22 19  CREATININE 0.86 0.76  CALCIUM 8.1* 8.0*  MG 1.7  --    Recent Labs    01/19/21 2231 01/20/21 0908  AST 24 18  ALT 10 13  ALKPHOS 80 82  BILITOT 1.7* 1.3*  PROT 5.6* 5.9*  ALBUMIN 2.3* 2.5*   Recent Labs  01/19/21 2231 01/20/21 0908 01/21/21 0530  WBC 22.3* 22.3* 22.9*  NEUTROABS 13.0*  --  PENDING  HGB 11.8* 12.4 11.3*  HCT 33.5* 37.4 33.6*  MCV 86.6 86.8 87.3  PLT 146* 155 167   No results for input(s): CKTOTAL, CKMB, TROPONINI in the last 72 hours. Invalid input(s): POCBNP No results for input(s): HGBA1C in the last 72 hours.   Weights: Filed Weights   01/19/21 0551 01/20/21 0500 01/21/21 0500  Weight: 88.7 kg 88.5 kg 91.7  kg     Radiology/Studies:  CT ABDOMEN PELVIS WO CONTRAST  Result Date: 01/19/2021 CLINICAL DATA:  Abdominal distention. Cholecystitis status post percutaneous cholecystostomy tube placement. Continued leukocytosis, pain. EXAM: CT ABDOMEN AND PELVIS WITHOUT CONTRAST TECHNIQUE: Multidetector CT imaging of the abdomen and pelvis was performed following the standard protocol without IV contrast. COMPARISON:  01/15/2021 FINDINGS: Lower chest: New moderate right pleural effusion with compressive atelectasis/infiltrate in the right lower lobe. Hepatobiliary: Post percutaneous cholecystostomy tube placement. The gallbladder is decompressed. No biliary ductal dilatation. Nodular contours of the liver compatible with cirrhosis. Pancreas: No focal abnormality or ductal dilatation. Spleen: No focal abnormality.  Normal size. Adrenals/Urinary Tract: Perinephric stranding bilaterally, increasing since prior study. No hydronephrosis. Adrenal glands and urinary bladder grossly unremarkable. Stomach/Bowel: Postoperative changes in the stomach. No bowel obstruction or acute finding. Vascular/Lymphatic: Aortic atherosclerosis. No evidence of aneurysm or adenopathy. Reproductive: Uterus and adnexa unremarkable.  No mass. Other: Trace free fluid in the cul-de-sac. Increasing perihepatic ascites. Musculoskeletal: No acute bony abnormality. IMPRESSION: Interval percutaneous cholecystostomy tube placement with decompression of the gallbladder. Changes of cirrhosis.  Increasing perihepatic ascites. Moderate right pleural effusion with right lower lobe atelectasis or infiltrate. Electronically Signed   By: Rolm Baptise M.D.   On: 01/19/2021 10:30   CT ABDOMEN PELVIS WO CONTRAST  Result Date: 01/15/2021 CLINICAL DATA:  Abdominal distension and right upper quadrant abdominal pain. History of cholecystostomy tube removal on 12/06/2020 EXAM: CT ABDOMEN AND PELVIS WITHOUT CONTRAST TECHNIQUE: Multidetector CT imaging of the abdomen and  pelvis was performed following the standard protocol without IV contrast. COMPARISON:  CT 09/23/2020, ultrasound 01/15/2021 FINDINGS: Lower chest: Bibasilar dependent atelectasis. Heart size is normal. No pericardial effusion. Hepatobiliary: Gallbladder is moderately distended with multiple hyperdense gallstones. Gallbladder wall appears diffusely thickened with pericholecystic inflammatory fat stranding and small volume of adjacent fluid. Unenhanced liver is within normal limits. Small volume perihepatic ascites. Pancreas: Unremarkable. No pancreatic ductal dilatation or surrounding inflammatory changes. Spleen: Unremarkable. Adrenals/Urinary Tract: Slightly nodular configuration of the left adrenal gland is unchanged. Unremarkable right adrenal gland. Bilateral kidneys are within normal limits. No renal stone or hydronephrosis. Urinary bladder is unremarkable. Stomach/Bowel: Postsurgical changes again noted at the stomach. No evidence of bowel wall thickening, distention, or inflammatory changes. Vascular/Lymphatic: Scattered aortoiliac atherosclerotic calcifications without aneurysm. No abdominopelvic lymphadenopathy. Reproductive: Uterus and bilateral adnexa are unremarkable. Other: No organized abdominopelvic fluid collection. No pneumoperitoneum. No abdominal wall hernia. Musculoskeletal: No new or acute bony findings. IMPRESSION: 1. Appearance of the gallbladder remains suspicious for acute cholecystitis. 2. Small volume perihepatic ascites. Aortic Atherosclerosis (ICD10-I70.0). Electronically Signed   By: Davina Poke D.O.   On: 01/15/2021 15:10   DG Chest 2 View  Result Date: 01/15/2021 CLINICAL DATA:  Pt arrived via [facility] with c/o upper abd pain, started a juice cleanse last Wednesday, states it was apple juice, Pt c/o N/V and abd pain. Last BM yesterday was loose, history of asthma, CHF, diabetes, GERD, hypertension, neuropathy EXAM: CHEST - 2 VIEW COMPARISON:  12/24/2018 FINDINGS: Stable changes  from prior cardiac surgery and valve replacements. Cardiac silhouette is normal in size. No mediastinal or hilar masses or evidence of adenopathy. Dependent opacity at both lung bases consistent with atelectasis. Remainder of the lungs is clear. No convincing pleural effusion and no pneumothorax. Skeletal structures are intact. IMPRESSION: No acute cardiopulmonary disease. Electronically Signed   By: Lajean Manes M.D.   On: 01/15/2021 13:37   DG Shoulder Right  Result Date: 01/15/2021 CLINICAL DATA:  Right shoulder pain EXAM: RIGHT SHOULDER - 2+ VIEW COMPARISON:  None. FINDINGS: No fracture or malalignment. Moderate AC joint degenerative change with mild glenohumeral degenerative change. Small amount of calcific tendinopathy. IMPRESSION: 1. Degenerative changes without acute osseous abnormality. 2. Calcific tendinopathy Electronically Signed   By: Donavan Foil M.D.   On: 01/15/2021 18:08   US Venous Img Lower Bilateral (DVT)  Result Date: 01/20/2021 CLINICAL DATA:  Positive D-dimer.  History of DVT EXAM: BILATERAL LOWER EXTREMITY VENOUS DOPPLER ULTRASOUND TECHNIQUE: Gray-scale sonography with compression, as well as color and duplex ultrasound, were performed to evaluate the deep venous system(s) from the level of the common femoral vein through the popliteal and proximal calf veins. COMPARISON:  05/29/2016 right lower extremity Doppler ultrasound. FINDINGS: VENOUS Right: Normal compressibility of the common femoral, superficial femoral, and popliteal veins, as well as the visualized calf veins. Visualized portions of profunda femoral vein and great saphenous vein unremarkable. No filling defects to suggest DVT on grayscale or color Doppler imaging. Doppler waveforms show normal direction of venous flow, normal respiratory plasticity and response to augmentation. Left: Peripheral mid level echoes with incomplete compressibility at the left popliteal vein. The other left lower extremity deep veins showed  normal color filling and compressibility. Present respiratory phasicity. IMPRESSION: 1. Nonocclusive clot in the left popliteal vein that is likely nonacute. 2. Negative for right lower extremity DVT. Electronically Signed   By: Jorje Guild M.D.   On: 01/20/2021 06:28   IR Perc Cholecystostomy  Result Date: 01/16/2021 CLINICAL DATA:  Recurrent acute cholecystitis and status post prior percutaneous cholecystostomy tube placement on 09/26/2020. The tube was removed on 12/06/2020 after the patient insisted it be removed. She has developed recurrent acute cholecystitis and sepsis and is not a candidate currently for cholecystectomy. EXAM: PERCUTANEOUS CHOLECYSTOSTOMY COMPARISON:  CT of the abdomen and pelvis on 01/15/2021 ANESTHESIA/SEDATION: Moderate (conscious) sedation was employed during this procedure. A total of Versed 1.0 mg and Fentanyl 100 mcg was administered intravenously. Moderate Sedation Time: 25 minutes. The patient's level of consciousness and vital signs were monitored continuously by radiology nursing throughout the procedure under my direct supervision. CONTRAST:  8mL OMNIPAQUE IOHEXOL 350 MG/ML SOLN MEDICATIONS: No additional medications were administered. FLUOROSCOPY TIME:  30 seconds.  8.1 mGy. PROCEDURE: The procedure, risks, benefits, and alternatives were explained to the patient. Questions regarding the procedure were encouraged and answered. The patient understands and consents to the procedure. A time-out was performed prior to initiating the procedure. The right abdominal wall was prepped with chlorhexidine in a sterile fashion, and a sterile drape was applied covering the operative field. A sterile gown and sterile gloves were used for the procedure. Local anesthesia was provided with 1% Lidocaine. Ultrasound image documentation was performed. Fluoroscopy during the procedure and fluoro spot radiograph confirms appropriate catheter position. Ultrasound was utilized to localize the  gallbladder. Under direct ultrasound guidance, an 18 gauge trocar needle was advanced into the gallbladder lumen. Aspiration was performed and a bile sample sent for culture studies.  A small amount of diluted contrast material was injected. A guide wire was then advanced into the gallbladder. Percutaneous tract dilatation was then performed over a guide wire to 10-French. A 10-French pigtail drainage catheter was then advanced into the gallbladder lumen under fluoroscopy. Catheter was formed and injected with contrast material to confirm position. The catheter was flushed and connected to a gravity drainage bag. It was secured at the skin with a Prolene retention suture and Stat-Lock device. COMPLICATIONS: None FINDINGS: After needle puncture of the gallbladder, a bile sample was aspirated and sent for culture. Bile return was dark in color with debris. The cholecystostomy tube was advanced into the gallbladder lumen and formed. It is now draining bile. This tube will be left to gravity drainage. IMPRESSION: Percutaneous cholecystostomy with placement of 10-French drainage catheter into the gallbladder lumen. This was left to gravity drainage. Electronically Signed   By: Aletta Edouard M.D.   On: 01/16/2021 16:17   ECHOCARDIOGRAM COMPLETE  Result Date: 01/20/2021    ECHOCARDIOGRAM REPORT   Patient Name:   Gina Chambers Date of Exam: 01/20/2021 Medical Rec #:  [id number]     Height:       63.0 in Accession #:    [id number]    Weight:       195.1 lb Date of Birth:  [birth year]    BSA:          1.913 m Patient Age:    80 years      BP:           96/56 mmHg Patient Gender: F             HR:           144 bpm. Exam Location:  [facility] Procedure: 2D Echo, Cardiac Doppler and Color Doppler Indications:     Atrial Flutter I48.92  History:         Patient has prior history of Echocardiogram examinations, most                  recent 09/26/2020. CHF; Risk Factors:Diabetes. DVT.                   Mitral Valve: bioprosthetic valve  valve is present in the                  mitral position.  Sonographer:     Sherrie Sport Referring Phys:  [id number] BRENDA MORRISON Diagnosing Phys: Serafina Royals MD IMPRESSIONS  1. Left ventricular ejection fraction, by estimation, is 65 to 70%. The left ventricle has normal function. The left ventricle has no regional wall motion abnormalities. Left ventricular diastolic parameters were normal.  2. Right ventricular systolic function is normal. The right ventricular size is normal.  3. Left atrial size was moderately dilated.  4. The mitral valve has been repaired/replaced. Trivial mitral valve regurgitation. 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 Echo findings are consistent with normal structure and function of the mitral valve prosthesis.  5. The aortic valve is normal in structure. Aortic valve regurgitation is trivial. FINDINGS  Left Ventricle: Left ventricular ejection fraction, by estimation, is 65 to 70%. The left ventricle has normal function. The left ventricle has no regional wall motion abnormalities. The left ventricular internal cavity size was small. There is no left ventricular hypertrophy. Left ventricular diastolic parameters were normal. Right Ventricle: The right ventricular size is normal. No increase in right ventricular wall thickness. Right ventricular systolic function is  normal. Left Atrium: Left atrial size was moderately dilated. Right Atrium: Right atrial size was normal in size. Pericardium: There is no evidence of pericardial effusion. Mitral Valve: The mitral valve has been repaired/replaced. Trivial mitral valve regurgitation. 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 Echo findings are consistent with normal structure and function of the mitral valve prosthesis. MV peak gradient, 13.0 mmHg. The mean mitral valve gradient is 6.0 mmHg. Tricuspid Valve: The tricuspid valve is normal in structure. Tricuspid valve regurgitation is trivial.  Aortic Valve: The aortic valve is normal in structure. Aortic valve regurgitation is trivial. Aortic valve mean gradient measures 17.3 mmHg. Aortic valve peak gradient measures 30.8 mmHg. Aortic valve area, by VTI measures 1.48 cm. Pulmonic Valve: The pulmonic valve was normal in structure. Pulmonic valve regurgitation is not visualized. Aorta: The aortic root and ascending aorta are structurally normal, with no evidence of dilitation. IAS/Shunts: No atrial level shunt detected by color flow Doppler.  LEFT VENTRICLE PLAX 2D LVIDd:         3.50 cm LVIDs:         2.50 cm LV PW:         1.10 cm LV IVS:        1.40 cm LVOT diam:     2.00 cm LV SV:         71 LV SV Index:   37 LVOT Area:     3.14 cm  RIGHT VENTRICLE RV Basal diam:  3.50 cm RV S prime:     14.80 cm/s TAPSE (M-mode): 3.5 cm LEFT ATRIUM             Index       RIGHT ATRIUM           Index LA diam:        2.80 cm 1.46 cm/m  RA Area:     15.60 cm LA Vol (A2C):   90.8 ml 47.45 ml/m RA Volume:   42.60 ml  22.26 ml/m LA Vol (A4C):   78.1 ml 40.82 ml/m LA Biplane Vol: 84.1 ml 43.95 ml/m  AORTIC VALVE                    PULMONIC VALVE AV Area (Vmax):    1.78 cm     PV Vmax:        0.30 m/s AV Area (Vmean):   1.99 cm     PV Peak grad:   0.4 mmHg AV Area (VTI):     1.48 cm     RVOT Peak grad: 3 mmHg AV Vmax:           277.67 cm/s AV Vmean:          191.333 cm/s AV VTI:            0.478 m AV Peak Grad:      30.8 mmHg AV Mean Grad:      17.3 mmHg LVOT Vmax:         157.00 cm/s LVOT Vmean:        121.000 cm/s LVOT VTI:          0.225 m LVOT/AV VTI ratio: 0.47  AORTA Ao Root diam: 2.00 cm MITRAL VALVE                TRICUSPID VALVE MV Area (PHT): 4.74 cm     TR Peak grad:   39.4 mmHg MV Area VTI:   1.76 cm     TR  Vmax:        314.00 cm/s MV Peak grad:  13.0 mmHg MV Mean grad:  6.0 mmHg     SHUNTS MV Vmax:       1.80 m/s     Systemic VTI:  0.22 m MV Vmean:      110.7 cm/s   Systemic Diam: 2.00 cm MV Decel Time: 160 msec MV E velocity: 180.00 cm/s MV A  velocity: 88.60 cm/s MV E/A ratio:  2.03 Serafina Royals MD Electronically signed by Serafina Royals MD Signature Date/Time: 01/20/2021/12:39:30 PM    Final    US ABDOMEN LIMITED RUQ (LIVER/GB)  Result Date: 01/15/2021 CLINICAL DATA:  Right upper quadrant pain since this morning. History of a gallbladder drainage in June 2022. Drain removed in August 2022. EXAM: ULTRASOUND ABDOMEN LIMITED RIGHT UPPER QUADRANT COMPARISON:  Ultrasound, 09/26/2020. FINDINGS: Gallbladder: Gallbladder is distended. There are dependent stones and sludge. Wall thickened to between 4 and 8 mm. Small amount of pericholecystic fluid. Common bile duct: Diameter: 6 mm Liver: No focal lesion identified. Within normal limits in parenchymal echogenicity. Portal vein is patent on color Doppler imaging with normal direction of blood flow towards the liver. Other: Trace perihepatic fluid. IMPRESSION: 1. Distended gallbladder with wall thickening and dependent stones and sludge as well as a small amount of pericholecystic fluid. Findings are similar to the prior ultrasound and support acute cholecystitis in the proper clinical setting. Electronically Signed   By: Lajean Manes M.D.   On: 01/15/2021 14:13     Assessment and Recommendation  80 y.o. 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  Continue heart rate control of atrial flutter with diltiazem drip and diltiazem orally at this time for goal heart rate between 80 and 110 bpm 2.  Further treatment and supportive care current illness and infection 3.  Begin cardiac rehabilitation 4.  Continue anticoagulation for further risk reduction stroke with atrial flutter 5.  No further cardiac diagnostics necessary  at this time 6.  Further adjustments of medication management after above  Signed, Serafina Royals M.D. FACC

## 2021-01-21 NOTE — Progress Notes (Signed)
Patient's HR steadily climbed up to 140's during shift after being transitioned to PO today. Discussed with NP on call and cardizem drip restarted. He came down after that, will continue to monitor.

## 2021-01-22 DIAGNOSIS — I4892 Unspecified atrial flutter: Secondary | ICD-10-CM | POA: Diagnosis not present

## 2021-01-22 DIAGNOSIS — K810 Acute cholecystitis: Secondary | ICD-10-CM | POA: Diagnosis not present

## 2021-01-22 DIAGNOSIS — I5032 Chronic diastolic (congestive) heart failure: Secondary | ICD-10-CM | POA: Diagnosis not present

## 2021-01-22 LAB — CBC WITH DIFFERENTIAL/PLATELET
Abs Immature Granulocytes: 1.12 10*3/uL — ABNORMAL HIGH (ref 0.00–0.07)
Basophils Absolute: 0.1 10*3/uL (ref 0.0–0.1)
Basophils Relative: 0 %
Eosinophils Absolute: 0.2 10*3/uL (ref 0.0–0.5)
Eosinophils Relative: 1 %
HCT: 33.5 % — ABNORMAL LOW (ref 36.0–46.0)
Hemoglobin: 11.3 g/dL — ABNORMAL LOW (ref 12.0–15.0)
Immature Granulocytes: 5 %
Lymphocytes Relative: 25 %
Lymphs Abs: 5.2 10*3/uL — ABNORMAL HIGH (ref 0.7–4.0)
MCH: 29.2 pg (ref 26.0–34.0)
MCHC: 33.7 g/dL (ref 30.0–36.0)
MCV: 86.6 fL (ref 80.0–100.0)
Monocytes Absolute: 1.7 10*3/uL — ABNORMAL HIGH (ref 0.1–1.0)
Monocytes Relative: 8 %
Neutro Abs: 12.8 10*3/uL — ABNORMAL HIGH (ref 1.7–7.7)
Neutrophils Relative %: 61 %
Platelets: 198 10*3/uL (ref 150–400)
RBC: 3.87 MIL/uL (ref 3.87–5.11)
RDW: 15.2 % (ref 11.5–15.5)
Smear Review: NORMAL
WBC: 21.1 10*3/uL — ABNORMAL HIGH (ref 4.0–10.5)
nRBC: 0 % (ref 0.0–0.2)

## 2021-01-22 LAB — BASIC METABOLIC PANEL
Anion gap: 12 (ref 5–15)
BUN: 22 mg/dL (ref 8–23)
CO2: 28 mmol/L (ref 22–32)
Calcium: 8.4 mg/dL — ABNORMAL LOW (ref 8.9–10.3)
Chloride: 91 mmol/L — ABNORMAL LOW (ref 98–111)
Creatinine, Ser: 0.9 mg/dL (ref 0.44–1.00)
GFR, Estimated: >60 mL/min (ref >60)
Glucose, Bld: 143 mg/dL — ABNORMAL HIGH (ref 70–99)
Potassium: 4 mmol/L (ref 3.5–5.1)
Sodium: 131 mmol/L — ABNORMAL LOW (ref 135–145)

## 2021-01-22 LAB — GLUCOSE, CAPILLARY
Glucose-Capillary: 163 mg/dL — ABNORMAL HIGH (ref 70–99)
Glucose-Capillary: 186 mg/dL — ABNORMAL HIGH (ref 70–99)
Glucose-Capillary: 165 mg/dL — ABNORMAL HIGH (ref 70–99)
Glucose-Capillary: 199 mg/dL — ABNORMAL HIGH (ref 70–99)

## 2021-01-22 LAB — MAGNESIUM: Magnesium: 2 mg/dL (ref 1.7–2.4)

## 2021-01-22 LAB — PROCALCITONIN: Procalcitonin: 0.66 ng/mL

## 2021-01-22 MED ORDER — LACTULOSE 10 GM/15ML PO SOLN
20.0000 g | Freq: Once | ORAL | Status: AC
  Administered 2021-01-22: 20 g via ORAL
  Filled 2021-01-22: qty 30

## 2021-01-22 NOTE — Evaluation (Signed)
Occupational Therapy Evaluation Patient Details Name: Gina Chambers MRN: [id number] DOB: 07-Jan-1941 Today's Date: 01/22/2021   History of Present Illness 80 y.o. female with medical history significant of hypertension, diabetes mellitus, COPD, asthma, GERD, hypothyroidism, OSA on CPAP, DVT on Xarelto, dCHF, vertigo, GI bleeding, COVID-19 infection, cholecystitis, who presents with abdominal pain. S/p cholecystostomy tube on 9/26.   Clinical Impression   Pt seen for OT evaluation this date. Upon arrival to room, pt resting in bed. Pt agreeable to OT eval. Prior to admission, pt was independent with ADLs, IADLs, and functional mobility, living with daughter on the main level of 2-story home. At baseline, pt cares for pets, runs errands, and drives. Pt was using RW following recent COVID-19 infection. Pt currently presents with decreased strength, balance, and activity tolerance. Due to these functional impairments, pt requires MIN A for bed mobility, MOD A for sit<>stand transfers, and MIN GUARD-MIN A for standing grooming tasks. Upon initial static standing, HR 90-110s. Following 3 mins of standing grooming tasks, HR 130s (MAX HR briefly 145) and SpO2 >92%; RN informed. Pt appeared fatigued following standing grooming tasks and returned to bed (with HR returning to resting HR 70s). Throughout session, pt verbalized concern regarding going home with current level of assistance required. Pt would benefit from additional skilled OT services to maximize return to PLOF and minimize risk of future falls, injury, caregiver burden, and readmission. Upon discharge, recommend SNF.    Recommendations for follow up therapy are one component of a multi-disciplinary discharge planning process, led by the attending physician.  Recommendations may be updated based on patient status, additional functional criteria and insurance authorization.   Follow Up Recommendations  SNF    Equipment Recommendations  3 in 1  bedside commode;Tub/shower seat       Precautions / Restrictions Precautions Precautions: Fall Precaution Comments: monitor HR Restrictions Weight Bearing Restrictions: No      Mobility Bed Mobility Overal bed mobility: Needs Assistance Bed Mobility: Supine to Sit;Sit to Supine     Supine to sit: Min guard;HOB elevated Sit to supine: Min assist   General bed mobility comments: MIN A for LE support sit>supine. Require increase time/effort    Transfers Overall transfer level: Needs assistance Equipment used: Rolling walker (2 wheeled) Transfers: Sit to/from Stand Sit to Stand: Mod assist              Balance Overall balance assessment: Needs assistance Sitting-balance support: No upper extremity supported;Feet supported Sitting balance-Leahy Scale: Fair Sitting balance - Comments: Fair static sitting balance at EOB   Standing balance support: Bilateral upper extremity supported;During functional activity Standing balance-Leahy Scale: Fair Standing balance comment: Pt reliant on UE support via forearms on sink counter during grooming tasks or b/l UE on RW during functional mobility                           ADL either performed or assessed with clinical judgement   ADL Overall ADL's : Needs assistance/impaired     Grooming: Wash/dry hands;Oral care;Wash/dry face;Min guard;Standing Grooming Details (indicate cue type and reason): MIN GUARD d/t decreased standing balance and fatigue with OOB mobility. x1 LOB with pt able to regain balance following CGA                             Functional mobility during ADLs: Min guard;Rolling walker (to walk ~95ft forward/backward)  Vision Baseline Vision/History: 1 Wears glasses Patient Visual Report: No change from baseline              Pertinent Vitals/Pain Pain Assessment: Faces Faces Pain Scale: Hurts a little bit Pain Location: incision/tube site Pain Descriptors / Indicators:  Discomfort Pain Intervention(s): Limited activity within patient's tolerance;Monitored during session;Repositioned     Hand Dominance     Extremity/Trunk Assessment Upper Extremity Assessment Upper Extremity Assessment: Generalized weakness (grossly 4-/5 in all movements)   Lower Extremity Assessment Lower Extremity Assessment: Generalized weakness       Communication Communication Communication: No difficulties   Cognition Arousal/Alertness: Awake/alert Behavior During Therapy: WFL for tasks assessed/performed;Anxious Overall Cognitive Status: Within Functional Limits for tasks assessed                                 General Comments: Pt agreeable and putting forth good effort throughout, however verbalizing concern regarding discharging home before feeling medically and physically ready. RN informed   General Comments  Resting HR 70s, HR during static standing 90-110s, HR 130s (with MAX HR 145) following 3 mins of standing grooming tasks, HR returning to 71 at end of session. RN informed. SpO2>92% throughout while pt on RA    Exercises Other Exercises Other Exercises: Educated pt on role of OT, POC, and discharge recommendations        Home Living Family/patient expects to be discharged to:: Private residence Living Arrangements: Children Available Help at Discharge: Family;Available PRN/intermittently Type of Home: House Home Access: Stairs to enter [employer] of Steps: 1 Entrance Stairs-Rails: None Home Layout: Two level;Able to live on main level with bedroom/bathroom               Home Equipment: Walker - 2 wheels          Prior Functioning/Environment Level of Independence: Independent        Comments: Independent with ADLs, IADLs, and functional mobility. At baseline, pt cares for pets, runs errands, and drives. Pt was using RW following recent COVID infections        OT Problem List: Decreased strength;Decreased  activity tolerance;Impaired balance (sitting and/or standing);Decreased knowledge of use of DME or AE;Cardiopulmonary status limiting activity;Pain      OT Treatment/Interventions: Self-care/ADL training;Therapeutic exercise;Energy conservation;DME and/or AE instruction;Therapeutic activities;Patient/family education;Balance training    OT Goals(Current goals can be found in the care plan section) Acute Rehab OT Goals Patient Stated Goal: to return home after improving independence OT Goal Formulation: With patient Time For Goal Achievement: 02/05/21 Potential to Achieve Goals: Fair ADL Goals Pt Will Perform Grooming: with set-up;standing Pt Will Perform Lower Body Dressing: with min guard assist;with adaptive equipment;sit to/from stand Pt Will Transfer to Toilet: with modified independence;ambulating;bedside commode  OT Frequency: Min 2X/week    AM-PAC OT "6 Clicks" Daily Activity     Outcome Measure Help from another person eating meals?: A Little Help from another person taking care of personal grooming?: A Little Help from another person toileting, which includes using toliet, bedpan, or urinal?: A Lot Help from another person bathing (including washing, rinsing, drying)?: A Lot Help from another person to put on and taking off regular upper body clothing?: A Little Help from another person to put on and taking off regular lower body clothing?: A Lot 6 Click Score: 15   End of Session Equipment Utilized During Treatment: Gait belt;Rolling walker Nurse Communication: Mobility status;Other (comment) (vitals during  mobility)  Activity Tolerance: Patient tolerated treatment well Patient left: in bed;with call bell/phone within reach;with bed alarm set  OT Visit Diagnosis: Unsteadiness on feet (R26.81);Muscle weakness (generalized) (M62.81);Pain Pain - Right/Left: Left Pain - part of body:  (abdomen)                Time: 1520-1550 OT Time Calculation (min): 30 min Charges:  OT  General Charges $OT Visit: 1 Visit OT Evaluation $OT Eval Moderate Complexity: 1 Mod OT Treatments $Self Care/Home Management : 8-22 mins $Therapeutic Activity: 8-22 mins  Fredirick Maudlin, OTR/L [place]

## 2021-01-22 NOTE — Progress Notes (Signed)
PROGRESS NOTE    Gina Chambers  SJG:[id number] DOB: 1941-03-24 DOA: 01/15/2021 PCP: Kirk Ruths, MD    Brief Narrative:   80 y.o. female with medical history significant of hypertension, diabetes mellitus, COPD, asthma, GERD, hypothyroidism, OSA on CPAP, DVT on Xarelto, dCHF, vertigo, GI bleeding, COVID-19 infection, cholecystitis, who presents with abdominal pain.  She is diagnosed with cholecystitis with cholelithiasis.  Cholecystostomy tube was placed on 9/26, antibiotics were continued on Zosyn. Patient developed atrial flutter with rapid ventricular response 9/30, seen by cardiology, given diltiazem, Toprol and digoxin.  Assessment & Plan:   Principal Problem:   Acute cholecystitis Active Problems:   Leukocytosis   Generalized weakness   Acquired hypothyroidism   Essential hypertension   Type 2 diabetes mellitus without complication (HCC)   Hyponatremia   COPD (chronic obstructive pulmonary disease) (HCC)   DVT (deep venous thrombosis) (HCC)   OSA on CPAP   Chronic diastolic CHF (congestive heart failure) (HCC)   Sepsis (HCC)   Right shoulder pain   Paroxysmal atrial flutter (HCC)  Sepsis secondary to acute cholecystitis. Acute cholecystitis with cholelithiasis. Persistent leukocytosis Patient is status post cholecystectomy tube placement, planning to keep tube for 6 to 8 weeks. Patient still on Unasyn. Patient had persistent leukocytosis, but appears to be improving today.  Recheck a procalcitonin level on is 0.68.  I will continue Unasyn.  May change to Augmentin at time of discharge.  Paroxysmal atrial flutter with rapid major response. Chronic diastolic congestive heart failure. Essential hypertension Patient initially required diltiazem drip, currently transition to oral diltiazem.  Heart rate seem to be better, still approaching 140s with exertion.  Patient is followed by our cardiology. Continue Xarelto.  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 Hypokalemia. Continue diuretics with torsemide and Aldactone.  Sodium level still stable.  Patient is also on fluid restriction.    DVT. Continue Xarelto.  Type 2 diabetes. Continue current regimen.  COPD. Obstructive sleep apnea. Conditions are stable, continue CPAP for sleep.  Patient has been seen by PT/OT, may discharge to home with home care in 1 to 2 days..  DVT prophylaxis: Xarelto Code Status: DNR Family Communication: Updated daughter and son-in-law Disposition Plan:      Status is: Inpatient   Remains inpatient appropriate because:IV treatments appropriate due to intensity of illness or inability to take PO and Inpatient level of care appropriate due to severity of illness   Dispo: The patient is from: Home              Anticipated d/c is to: Home              Patient currently is not medically stable to d/c.              Difficult to place patient No           I/O last 3 completed shifts: In: 1951.9 [P.O.:1500; I.V.:241.9; Other:10; IV Piggyback:200] Out: 1055 [Urine:750; Drains:305] Total I/O In: 245 [P.O.:240; Other:5] Out: 38 [Urine:850; Drains:450]   Consultants:  Cardiology, general surgery   Procedures:    Antimicrobials: Unasyn    Subjective: Spoke to nurse, patient was drinking large amount of liquid, placed on fluid restriction.  No abdominal pain or nausea vomiting.  Does complaining constipation, I spoke with nurse and the patient, patient had not had a bowel movement for the last 4 days.  Lactulose is given. No dysuria hematuria. No short of breath or cough. No fever or  chills  Objective: Vitals:   01/22/21 0008 01/22/21 0508 01/22/21 0818 01/22/21 1200  BP:  (!) 128/58 110/65 (!) 134/55  Pulse: 96  (!) 108 69  Resp:  19  17  Temp: 98.4 F (36.9 C) 98.3 F (36.8 C)  98.1 F (36.7 C)  TempSrc: Axillary Oral    SpO2: 95% 95%  93%  Weight:      Height:        Intake/Output  Summary (Last 24 hours) at 01/22/2021 1218 Last data filed at 01/22/2021 1100 Gross per 24 hour  Intake 1025 ml  Output 2175 ml  Net -1150 ml   Filed Weights   01/19/21 0551 01/20/21 0500 01/21/21 0500  Weight: 88.7 kg 88.5 kg 91.7 kg    Examination:  General exam: Appears calm and comfortable  Respiratory system: Clear to auscultation. Respiratory effort normal. Cardiovascular system: Irregularly irregular tachycardic. No JVD, murmurs, rubs, gallops or clicks. No pedal edema. Gastrointestinal system: Abdomen is nondistended, soft and nontender. No organomegaly or masses felt. Normal bowel sounds heard. Central nervous system: Alert and oriented x3. No focal neurological deficits. Extremities: Symmetric 5 x 5 power. Skin: No rashes, lesions or ulcers Psychiatry: Judgement and insight appear normal. Mood & affect appropriate.     Data Reviewed: I have personally reviewed following labs and imaging studies  CBC: Recent Labs  Lab 01/19/21 0534 01/19/21 2231 01/20/21 0908 01/21/21 0530 01/22/21 0609  WBC 21.2* 22.3* 22.3* 22.9* 21.1*  NEUTROABS  --  13.0*  --  13.6* 12.8*  HGB 12.0 11.8* 12.4 11.3* 11.3*  HCT 37.4 33.5* 37.4 33.6* 33.5*  MCV 89.3 86.6 86.8 87.3 86.6  PLT 140* 146* 155 167 419   Basic Metabolic Panel: Recent Labs  Lab 01/19/21 0534 01/19/21 2231 01/20/21 0908 01/21/21 0530 01/22/21 0609  NA 132* 128* 130* 129* 131*  K 3.7 4.1 3.4* 3.9 4.0  CL 98 93* 93* 92* 91*  CO2 25 26 28 28 28   GLUCOSE 124* 133* 176* 140* 143*  BUN 20 22 19 22 22   CREATININE 0.80 0.86 0.76 0.87 0.90  CALCIUM 8.5* 8.1* 8.0* 8.3* 8.4*  MG  --  1.7  --  2.0 2.0   GFR: Estimated Creatinine Clearance: 55.4 mL/min (by C-G formula based on SCr of 0.9 mg/dL). Liver Function Tests: Recent Labs  Lab 01/18/21 0431 01/19/21 0534 01/19/21 2231 01/20/21 0908 01/21/21 0530  AST 15 14* 24 18 17   ALT 13 12 10 13 12   ALKPHOS 92 76 80 82 90  BILITOT 1.0 1.2 1.7* 1.3* 1.0  PROT 5.6*  5.5* 5.6* 5.9* 5.6*  ALBUMIN 2.5* 2.3* 2.3* 2.5* 2.3*   Recent Labs  Lab 01/15/21 1251  LIPASE 28   No results for input(s): AMMONIA in the last 168 hours. Coagulation Profile: Recent Labs  Lab 01/15/21 1907  INR 1.1   Cardiac Enzymes: No results for input(s): CKTOTAL, CKMB, CKMBINDEX, TROPONINI in the last 168 hours. BNP (last 3 results) No results for input(s): PROBNP in the last 8760 hours. HbA1C: No results for input(s): HGBA1C in the last 72 hours. CBG: Recent Labs  Lab 01/21/21 1144 01/21/21 1657 01/21/21 2156 01/22/21 0755 01/22/21 1140  GLUCAP 261* 218* 149* 163* 186*   Lipid Profile: No results for input(s): CHOL, HDL, LDLCALC, TRIG, CHOLHDL, LDLDIRECT in the last 72 hours. Thyroid Function Tests: Recent Labs    01/19/21 2231  TSH 2.426  T4TOTAL 6.3   Anemia Panel: No results for input(s): VITAMINB12, FOLATE, FERRITIN, TIBC, IRON, RETICCTPCT  in the last 72 hours. Sepsis Labs: Recent Labs  Lab 01/15/21 1907 01/15/21 2131 01/22/21 0609  PROCALCITON 0.18  --  0.66  LATICACIDVEN 2.3* 1.3  --     Recent Results (from the past 240 hour(s))  Resp Panel by RT-PCR (Flu A&B, Covid) Nasopharyngeal Swab     Status: None   Collection Time: 01/15/21  2:41 PM   Specimen: Nasopharyngeal Swab; Nasopharyngeal(NP) swabs in vial transport medium  Result Value Ref Range Status   SARS Coronavirus 2 by RT PCR NEGATIVE NEGATIVE Final    Comment: (NOTE) SARS-CoV-2 target nucleic acids are NOT DETECTED.  The SARS-CoV-2 RNA is generally detectable in upper respiratory specimens during the acute phase of infection. The lowest concentration of SARS-CoV-2 viral copies this assay can detect is 138 copies/mL. A negative result does not preclude SARS-Cov-2 infection and should not be used as the sole basis for treatment or other patient management decisions. A negative result may occur with  improper specimen collection/handling, submission of specimen other than  nasopharyngeal swab, presence of viral mutation(s) within the areas targeted by this assay, and inadequate number of viral copies(<138 copies/mL). A negative result must be combined with clinical observations, patient history, and epidemiological information. The expected result is Negative.  Fact Sheet for Patients:  EntrepreneurPulse.com.au  Fact Sheet for Healthcare Providers:  IncredibleEmployment.be  This test is no t yet approved or cleared by the [place] FDA and  has been authorized for detection and/or diagnosis of SARS-CoV-2 by FDA under an Emergency Use Authorization (EUA). This EUA will remain  in effect (meaning this test can be used) for the duration of the COVID-19 declaration under Section 564(b)(1) of the Act, 21 U.S.C.section 360bbb-3(b)(1), unless the authorization is terminated  or revoked sooner.       Influenza A by PCR NEGATIVE NEGATIVE Final   Influenza B by PCR NEGATIVE NEGATIVE Final    Comment: (NOTE) The Xpert Xpress SARS-CoV-2/FLU/RSV plus assay is intended as an aid in the diagnosis of influenza from Nasopharyngeal swab specimens and should not be used as a sole basis for treatment. Nasal washings and aspirates are unacceptable for Xpert Xpress SARS-CoV-2/FLU/RSV testing.  Fact Sheet for Patients: EntrepreneurPulse.com.au  Fact Sheet for Healthcare Providers: IncredibleEmployment.be  This test is not yet approved or cleared by the [place] FDA and has been authorized for detection and/or diagnosis of SARS-CoV-2 by FDA under an Emergency Use Authorization (EUA). This EUA will remain in effect (meaning this test can be used) for the duration of the COVID-19 declaration under Section 564(b)(1) of the Act, 21 U.S.C. section 360bbb-3(b)(1), unless the authorization is terminated or revoked.  Performed at [facility], [address] [place], [place]  [address]   Culture, blood (x 2)     Status: None   Collection Time: 01/15/21  7:08 PM   Specimen: BLOOD  Result Value Ref Range Status   Specimen Description BLOOD BLOOD LEFT HAND  Final   Special Requests   Final    BOTTLES DRAWN AEROBIC ONLY Blood Culture adequate volume   Culture   Final    NO GROWTH 5 DAYS Performed at [facility], [address] [place], [place] [address]    Report Status 01/20/2021 FINAL  Final  Culture, blood (x 2)     Status: None   Collection Time: 01/15/21  8:05 PM   Specimen: BLOOD  Result Value Ref Range Status   Specimen Description BLOOD BLOOD LEFT HAND  Final   Special  Requests   Final    BOTTLES DRAWN AEROBIC ONLY Blood Culture adequate volume   Culture   Final    NO GROWTH 5 DAYS Performed at [facility], [address] [place], [place] [address]    Report Status 01/20/2021 FINAL  Final  Aerobic/Anaerobic Culture w Gram Stain (surgical/deep wound)     Status: None   Collection Time: 01/16/21  3:40 PM   Specimen: Gallbladder; Bile  Result Value Ref Range Status   Specimen Description   Final    GALL BLADDER Performed at [facility], [address] [place], [place] [address]    Special Requests   Final    Normal Performed at [facility], [address] [place], [place] [address]    Gram Stain   Final    NO SQUAMOUS EPITHELIAL CELLS SEEN FEW WBC SEEN FEW GRAM POSITIVE COCCI    Culture   Final    FEW ENTEROCOCCUS GALLINARUM RARE ESCHERICHIA COLI NO ANAEROBES ISOLATED Performed at [facility], [place] [address], [place], [place] [address]    Report Status 01/21/2021 FINAL  Final   Organism ID, Bacteria ENTEROCOCCUS GALLINARUM  Final   Organism ID, Bacteria ESCHERICHIA COLI  Final      Susceptibility   Escherichia coli - MIC*    AMPICILLIN <=2 SENSITIVE Sensitive     CEFAZOLIN <=4 SENSITIVE Sensitive     CEFEPIME <=0.12 SENSITIVE Sensitive     CEFTAZIDIME <=1 SENSITIVE Sensitive      CEFTRIAXONE <=0.25 SENSITIVE Sensitive     CIPROFLOXACIN <=0.25 SENSITIVE Sensitive     GENTAMICIN <=1 SENSITIVE Sensitive     IMIPENEM <=0.25 SENSITIVE Sensitive     TRIMETH/SULFA <=20 SENSITIVE Sensitive     AMPICILLIN/SULBACTAM <=2 SENSITIVE Sensitive     PIP/TAZO <=4 SENSITIVE Sensitive     * RARE ESCHERICHIA COLI   Enterococcus gallinarum - MIC*    AMPICILLIN <=2 SENSITIVE Sensitive     VANCOMYCIN RESISTANT Resistant     GENTAMICIN SYNERGY SENSITIVE Sensitive     LINEZOLID 1 SENSITIVE Sensitive     * FEW ENTEROCOCCUS GALLINARUM         Radiology Studies: No results found.      Scheduled Meds:  aspirin EC  81 mg Oral QHS   diltiazem  120 mg Oral BID   fluticasone  2 puff Inhalation BID   gabapentin  100 mg Oral QID   insulin aspart  0-5 Units Subcutaneous QHS   insulin aspart  0-9 Units Subcutaneous TID WC   insulin glargine-yfgn  10 Units Subcutaneous Daily   And   insulin glargine-yfgn  5 Units Subcutaneous QHS   lactulose  20 g Oral Once   levothyroxine  125 mcg Oral Q0600   metoprolol succinate  50 mg Oral Daily   montelukast  10 mg Oral QHS   multivitamin with minerals   Oral Daily   pantoprazole  20 mg Oral Daily   rivaroxaban  20 mg Oral Q supper   sodium chloride flush  5 mL Intracatheter Q8H   spironolactone  25 mg Oral Daily   torsemide  40 mg Oral BID   Continuous Infusions:  sodium chloride     ampicillin-sulbactam (UNASYN) IV Stopped (01/22/21 1051)     LOS: 7 days    Time spent: 32 minutes    Sharen Hones, MD [facility]   To contact the attending provider between 7A-7P or the covering provider during after hours 7P-7A, please log into  the web site [email or web address] and access using universal [place] password for that web site. If you do not have the password, please call the hospital operator.  01/22/2021, 12:18 PM

## 2021-01-22 NOTE — Progress Notes (Signed)
[facility] Cardiology [facility] Encounter Note  Patient: Gina Chambers / Admit Date: 01/15/2021 / Date of Encounter: 01/22/2021, 7:29 AM   Subjective: Patient overall slowly getting better from current issues infection and abdominal discomfort.  Patient's heart rate control is now much better since she is converted into normal sinus rhythm with current medical regimen.  Telemetry shows normal sinus rhythm  Echocardiogram showing normal LV systolic function with normally functioning prosthetic valves and ejection fraction of 60% Review of Systems: Positive for: Weakness fatigue Negative for: Vision change, hearing change, syncope, dizziness, nausea, vomiting,diarrhea, bloody stool, stomach pain, cough, congestion, diaphoresis, urinary frequency, urinary pain,skin lesions, skin rashes Others previously listed  Objective: Telemetry: Atrial flutter with variable heart rate Physical Exam: Blood pressure (!) 128/58, pulse 96, temperature 98.3 F (36.8 C), temperature source Oral, resp. rate 19, height 5\' 3"  (1.6 m), weight 91.7 kg, SpO2 95 %. Body mass index is 35.81 kg/m. General: Well developed, well nourished, in no acute distress. Head: Normocephalic, atraumatic, sclera non-icteric, no xanthomas, nares are without discharge. Neck: No apparent masses Lungs: Normal respirations with no wheezes, no rhonchi, no rales , no crackles   Heart: Irregular trace rate and rhythm, normal S1 S2, no murmur, no rub, no gallop, PMI is normal size and placement, carotid upstroke normal without bruit, jugular venous pressure normal Abdomen: Soft, non-tender, non-distended with normoactive bowel sounds. No hepatosplenomegaly. Abdominal aorta is normal size without bruit Extremities: Trace edema, no clubbing, no cyanosis, no ulcers,  Peripheral: 2+ radial, 2+ femoral, 2+ dorsal pedal pulses Neuro: Alert and oriented. Moves all extremities spontaneously. Psych:  Responds to questions appropriately with a  normal affect.   Intake/Output Summary (Last 24 hours) at 01/22/2021 0729 Last data filed at 01/22/2021 0009 Gross per 24 hour  Intake 1260 ml  Output 875 ml  Net 385 ml     Inpatient Medications:  . aspirin EC  81 mg Oral QHS  . diltiazem  120 mg Oral BID  . fluticasone  2 puff Inhalation BID  . gabapentin  100 mg Oral QID  . insulin aspart  0-5 Units Subcutaneous QHS  . insulin aspart  0-9 Units Subcutaneous TID WC  . insulin glargine-yfgn  10 Units Subcutaneous Daily   And  . insulin glargine-yfgn  5 Units Subcutaneous QHS  . levothyroxine  125 mcg Oral Q0600  . metoprolol succinate  50 mg Oral Daily  . montelukast  10 mg Oral QHS  . multivitamin with minerals   Oral Daily  . pantoprazole  20 mg Oral Daily  . rivaroxaban  20 mg Oral Q supper  . sodium chloride flush  5 mL Intracatheter Q8H  . spironolactone  25 mg Oral Daily  . torsemide  40 mg Oral BID   Infusions:  . sodium chloride    . ampicillin-sulbactam (UNASYN) IV 3 g (01/22/21 0513)    Labs: Recent Labs    01/19/21 2231 01/20/21 0908 01/21/21 0530  NA 128* 130* 129*  K 4.1 3.4* 3.9  CL 93* 93* 92*  CO2 26 28 28   GLUCOSE 133* 176* 140*  BUN 22 19 22   CREATININE 0.86 0.76 0.87  CALCIUM 8.1* 8.0* 8.3*  MG 1.7  --  2.0    Recent Labs    01/20/21 0908 01/21/21 0530  AST 18 17  ALT 13 12  ALKPHOS 82 90  BILITOT 1.3* 1.0  PROT 5.9* 5.6*  ALBUMIN 2.5* 2.3*    Recent Labs    01/19/21 2231 01/20/21 0908  01/21/21 0530  WBC 22.3* 22.3* 22.9*  NEUTROABS 13.0*  --  13.6*  HGB 11.8* 12.4 11.3*  HCT 33.5* 37.4 33.6*  MCV 86.6 86.8 87.3  PLT 146* 155 167    No results for input(s): CKTOTAL, CKMB, TROPONINI in the last 72 hours. Invalid input(s): POCBNP No results for input(s): HGBA1C in the last 72 hours.   Weights: Filed Weights   01/19/21 0551 01/20/21 0500 01/21/21 0500  Weight: 88.7 kg 88.5 kg 91.7 kg     Radiology/Studies:  CT ABDOMEN PELVIS WO CONTRAST  Result Date:  01/19/2021 CLINICAL DATA:  Abdominal distention. Cholecystitis status post percutaneous cholecystostomy tube placement. Continued leukocytosis, pain. EXAM: CT ABDOMEN AND PELVIS WITHOUT CONTRAST TECHNIQUE: Multidetector CT imaging of the abdomen and pelvis was performed following the standard protocol without IV contrast. COMPARISON:  01/15/2021 FINDINGS: Lower chest: New moderate right pleural effusion with compressive atelectasis/infiltrate in the right lower lobe. Hepatobiliary: Post percutaneous cholecystostomy tube placement. The gallbladder is decompressed. No biliary ductal dilatation. Nodular contours of the liver compatible with cirrhosis. Pancreas: No focal abnormality or ductal dilatation. Spleen: No focal abnormality.  Normal size. Adrenals/Urinary Tract: Perinephric stranding bilaterally, increasing since prior study. No hydronephrosis. Adrenal glands and urinary bladder grossly unremarkable. Stomach/Bowel: Postoperative changes in the stomach. No bowel obstruction or acute finding. Vascular/Lymphatic: Aortic atherosclerosis. No evidence of aneurysm or adenopathy. Reproductive: Uterus and adnexa unremarkable.  No mass. Other: Trace free fluid in the cul-de-sac. Increasing perihepatic ascites. Musculoskeletal: No acute bony abnormality. IMPRESSION: Interval percutaneous cholecystostomy tube placement with decompression of the gallbladder. Changes of cirrhosis.  Increasing perihepatic ascites. Moderate right pleural effusion with right lower lobe atelectasis or infiltrate. Electronically Signed   By: Rolm Baptise M.D.   On: 01/19/2021 10:30   CT ABDOMEN PELVIS WO CONTRAST  Result Date: 01/15/2021 CLINICAL DATA:  Abdominal distension and right upper quadrant abdominal pain. History of cholecystostomy tube removal on 12/06/2020 EXAM: CT ABDOMEN AND PELVIS WITHOUT CONTRAST TECHNIQUE: Multidetector CT imaging of the abdomen and pelvis was performed following the standard protocol without IV contrast.  COMPARISON:  CT 09/23/2020, ultrasound 01/15/2021 FINDINGS: Lower chest: Bibasilar dependent atelectasis. Heart size is normal. No pericardial effusion. Hepatobiliary: Gallbladder is moderately distended with multiple hyperdense gallstones. Gallbladder wall appears diffusely thickened with pericholecystic inflammatory fat stranding and small volume of adjacent fluid. Unenhanced liver is within normal limits. Small volume perihepatic ascites. Pancreas: Unremarkable. No pancreatic ductal dilatation or surrounding inflammatory changes. Spleen: Unremarkable. Adrenals/Urinary Tract: Slightly nodular configuration of the left adrenal gland is unchanged. Unremarkable right adrenal gland. Bilateral kidneys are within normal limits. No renal stone or hydronephrosis. Urinary bladder is unremarkable. Stomach/Bowel: Postsurgical changes again noted at the stomach. No evidence of bowel wall thickening, distention, or inflammatory changes. Vascular/Lymphatic: Scattered aortoiliac atherosclerotic calcifications without aneurysm. No abdominopelvic lymphadenopathy. Reproductive: Uterus and bilateral adnexa are unremarkable. Other: No organized abdominopelvic fluid collection. No pneumoperitoneum. No abdominal wall hernia. Musculoskeletal: No new or acute bony findings. IMPRESSION: 1. Appearance of the gallbladder remains suspicious for acute cholecystitis. 2. Small volume perihepatic ascites. Aortic Atherosclerosis (ICD10-I70.0). Electronically Signed   By: Davina Poke D.O.   On: 01/15/2021 15:10   DG Chest 2 View  Result Date: 01/15/2021 CLINICAL DATA:  Pt arrived via [facility] with c/o upper abd pain, started a juice cleanse last Wednesday, states it was apple juice, Pt c/o N/V and abd pain. Last BM yesterday was loose, history of asthma, CHF, diabetes, GERD, hypertension, neuropathy EXAM: CHEST - 2 VIEW COMPARISON:  12/24/2018 FINDINGS: Stable  changes from prior cardiac surgery and valve replacements. Cardiac silhouette is  normal in size. No mediastinal or hilar masses or evidence of adenopathy. Dependent opacity at both lung bases consistent with atelectasis. Remainder of the lungs is clear. No convincing pleural effusion and no pneumothorax. Skeletal structures are intact. IMPRESSION: No acute cardiopulmonary disease. Electronically Signed   By: Lajean Manes M.D.   On: 01/15/2021 13:37   DG Shoulder Right  Result Date: 01/15/2021 CLINICAL DATA:  Right shoulder pain EXAM: RIGHT SHOULDER - 2+ VIEW COMPARISON:  None. FINDINGS: No fracture or malalignment. Moderate AC joint degenerative change with mild glenohumeral degenerative change. Small amount of calcific tendinopathy. IMPRESSION: 1. Degenerative changes without acute osseous abnormality. 2. Calcific tendinopathy Electronically Signed   By: Donavan Foil M.D.   On: 01/15/2021 18:08   US Venous Img Lower Bilateral (DVT)  Result Date: 01/20/2021 CLINICAL DATA:  Positive D-dimer.  History of DVT EXAM: BILATERAL LOWER EXTREMITY VENOUS DOPPLER ULTRASOUND TECHNIQUE: Gray-scale sonography with compression, as well as color and duplex ultrasound, were performed to evaluate the deep venous system(s) from the level of the common femoral vein through the popliteal and proximal calf veins. COMPARISON:  05/29/2016 right lower extremity Doppler ultrasound. FINDINGS: VENOUS Right: Normal compressibility of the common femoral, superficial femoral, and popliteal veins, as well as the visualized calf veins. Visualized portions of profunda femoral vein and great saphenous vein unremarkable. No filling defects to suggest DVT on grayscale or color Doppler imaging. Doppler waveforms show normal direction of venous flow, normal respiratory plasticity and response to augmentation. Left: Peripheral mid level echoes with incomplete compressibility at the left popliteal vein. The other left lower extremity deep veins showed normal color filling and compressibility. Present respiratory phasicity.  IMPRESSION: 1. Nonocclusive clot in the left popliteal vein that is likely nonacute. 2. Negative for right lower extremity DVT. Electronically Signed   By: Jorje Guild M.D.   On: 01/20/2021 06:28   IR Perc Cholecystostomy  Result Date: 01/16/2021 CLINICAL DATA:  Recurrent acute cholecystitis and status post prior percutaneous cholecystostomy tube placement on 09/26/2020. The tube was removed on 12/06/2020 after the patient insisted it be removed. She has developed recurrent acute cholecystitis and sepsis and is not a candidate currently for cholecystectomy. EXAM: PERCUTANEOUS CHOLECYSTOSTOMY COMPARISON:  CT of the abdomen and pelvis on 01/15/2021 ANESTHESIA/SEDATION: Moderate (conscious) sedation was employed during this procedure. A total of Versed 1.0 mg and Fentanyl 100 mcg was administered intravenously. Moderate Sedation Time: 25 minutes. The patient's level of consciousness and vital signs were monitored continuously by radiology nursing throughout the procedure under my direct supervision. CONTRAST:  51mL OMNIPAQUE IOHEXOL 350 MG/ML SOLN MEDICATIONS: No additional medications were administered. FLUOROSCOPY TIME:  30 seconds.  8.1 mGy. PROCEDURE: The procedure, risks, benefits, and alternatives were explained to the patient. Questions regarding the procedure were encouraged and answered. The patient understands and consents to the procedure. A time-out was performed prior to initiating the procedure. The right abdominal wall was prepped with chlorhexidine in a sterile fashion, and a sterile drape was applied covering the operative field. A sterile gown and sterile gloves were used for the procedure. Local anesthesia was provided with 1% Lidocaine. Ultrasound image documentation was performed. Fluoroscopy during the procedure and fluoro spot radiograph confirms appropriate catheter position. Ultrasound was utilized to localize the gallbladder. Under direct ultrasound guidance, an 18 gauge trocar needle  was advanced into the gallbladder lumen. Aspiration was performed and a bile sample sent for culture studies. A small amount  of diluted contrast material was injected. A guide wire was then advanced into the gallbladder. Percutaneous tract dilatation was then performed over a guide wire to 10-French. A 10-French pigtail drainage catheter was then advanced into the gallbladder lumen under fluoroscopy. Catheter was formed and injected with contrast material to confirm position. The catheter was flushed and connected to a gravity drainage bag. It was secured at the skin with a Prolene retention suture and Stat-Lock device. COMPLICATIONS: None FINDINGS: After needle puncture of the gallbladder, a bile sample was aspirated and sent for culture. Bile return was dark in color with debris. The cholecystostomy tube was advanced into the gallbladder lumen and formed. It is now draining bile. This tube will be left to gravity drainage. IMPRESSION: Percutaneous cholecystostomy with placement of 10-French drainage catheter into the gallbladder lumen. This was left to gravity drainage. Electronically Signed   By: Aletta Edouard M.D.   On: 01/16/2021 16:17   ECHOCARDIOGRAM COMPLETE  Result Date: 01/20/2021    ECHOCARDIOGRAM REPORT   Patient Name:   Gina Chambers Date of Exam: 01/20/2021 Medical Rec #:  [id number]     Height:       63.0 in Accession #:    [id number]    Weight:       195.1 lb Date of Birth:  03-22-1941    BSA:          1.913 m Patient Age:    80 years      BP:           96/56 mmHg Patient Gender: F             HR:           144 bpm. Exam Location:  [facility] Procedure: 2D Echo, Cardiac Doppler and Color Doppler Indications:     Atrial Flutter I48.92  History:         Patient has prior history of Echocardiogram examinations, most                  recent 09/26/2020. CHF; Risk Factors:Diabetes. DVT.                   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  Sonographer:      Sherrie Sport Referring Phys:  [id number] BRENDA MORRISON Diagnosing Phys: Serafina Royals MD IMPRESSIONS  1. Left ventricular ejection fraction, by estimation, is 65 to 70%. The left ventricle has normal function. The left ventricle has no regional wall motion abnormalities. Left ventricular diastolic parameters were normal.  2. Right ventricular systolic function is normal. The right ventricular size is normal.  3. Left atrial size was moderately dilated.  4. The mitral valve has been repaired/replaced. Trivial mitral valve regurgitation. 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 Echo findings are consistent with normal structure and function of the mitral valve prosthesis.  5. The aortic valve is normal in structure. Aortic valve regurgitation is trivial. FINDINGS  Left Ventricle: Left ventricular ejection fraction, by estimation, is 65 to 70%. The left ventricle has normal function. The left ventricle has no regional wall motion abnormalities. The left ventricular internal cavity size was small. There is no left ventricular hypertrophy. Left ventricular diastolic parameters were normal. Right Ventricle: The right ventricular size is normal. No increase in right ventricular wall thickness. Right ventricular systolic function is normal. Left Atrium:  Left atrial size was moderately dilated. Right Atrium: Right atrial size was normal in size. Pericardium: There is no evidence of pericardial effusion. Mitral Valve: The mitral valve has been repaired/replaced. Trivial mitral valve regurgitation. 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 Echo findings are consistent with normal structure and function of the mitral valve prosthesis. MV peak gradient, 13.0 mmHg. The mean mitral valve gradient is 6.0 mmHg. Tricuspid Valve: The tricuspid valve is normal in structure. Tricuspid valve regurgitation is trivial. Aortic Valve: The aortic valve is normal in structure. Aortic valve  regurgitation is trivial. Aortic valve mean gradient measures 17.3 mmHg. Aortic valve peak gradient measures 30.8 mmHg. Aortic valve area, by VTI measures 1.48 cm. Pulmonic Valve: The pulmonic valve was normal in structure. Pulmonic valve regurgitation is not visualized. Aorta: The aortic root and ascending aorta are structurally normal, with no evidence of dilitation. IAS/Shunts: No atrial level shunt detected by color flow Doppler.  LEFT VENTRICLE PLAX 2D LVIDd:         3.50 cm LVIDs:         2.50 cm LV PW:         1.10 cm LV IVS:        1.40 cm LVOT diam:     2.00 cm LV SV:         71 LV SV Index:   37 LVOT Area:     3.14 cm  RIGHT VENTRICLE RV Basal diam:  3.50 cm RV S prime:     14.80 cm/s TAPSE (M-mode): 3.5 cm LEFT ATRIUM             Index       RIGHT ATRIUM           Index LA diam:        2.80 cm 1.46 cm/m  RA Area:     15.60 cm LA Vol (A2C):   90.8 ml 47.45 ml/m RA Volume:   42.60 ml  22.26 ml/m LA Vol (A4C):   78.1 ml 40.82 ml/m LA Biplane Vol: 84.1 ml 43.95 ml/m  AORTIC VALVE                    PULMONIC VALVE AV Area (Vmax):    1.78 cm     PV Vmax:        0.30 m/s AV Area (Vmean):   1.99 cm     PV Peak grad:   0.4 mmHg AV Area (VTI):     1.48 cm     RVOT Peak grad: 3 mmHg AV Vmax:           277.67 cm/s AV Vmean:          191.333 cm/s AV VTI:            0.478 m AV Peak Grad:      30.8 mmHg AV Mean Grad:      17.3 mmHg LVOT Vmax:         157.00 cm/s LVOT Vmean:        121.000 cm/s LVOT VTI:          0.225 m LVOT/AV VTI ratio: 0.47  AORTA Ao Root diam: 2.00 cm MITRAL VALVE                TRICUSPID VALVE MV Area (PHT): 4.74 cm     TR Peak grad:   39.4 mmHg MV Area VTI:   1.76 cm     TR Vmax:  314.00 cm/s MV Peak grad:  13.0 mmHg MV Mean grad:  6.0 mmHg     SHUNTS MV Vmax:       1.80 m/s     Systemic VTI:  0.22 m MV Vmean:      110.7 cm/s   Systemic Diam: 2.00 cm MV Decel Time: 160 msec MV E velocity: 180.00 cm/s MV A velocity: 88.60 cm/s MV E/A ratio:  2.03 Serafina Royals MD Electronically  signed by Serafina Royals MD Signature Date/Time: 01/20/2021/12:39:30 PM    Final    US ABDOMEN LIMITED RUQ (LIVER/GB)  Result Date: 01/15/2021 CLINICAL DATA:  Right upper quadrant pain since this morning. History of a gallbladder drainage in June 2022. Drain removed in August 2022. EXAM: ULTRASOUND ABDOMEN LIMITED RIGHT UPPER QUADRANT COMPARISON:  Ultrasound, 09/26/2020. FINDINGS: Gallbladder: Gallbladder is distended. There are dependent stones and sludge. Wall thickened to between 4 and 8 mm. Small amount of pericholecystic fluid. Common bile duct: Diameter: 6 mm Liver: No focal lesion identified. Within normal limits in parenchymal echogenicity. Portal vein is patent on color Doppler imaging with normal direction of blood flow towards the liver. Other: Trace perihepatic fluid. IMPRESSION: 1. Distended gallbladder with wall thickening and dependent stones and sludge as well as a small amount of pericholecystic fluid. Findings are similar to the prior ultrasound and support acute cholecystitis in the proper clinical setting. Electronically Signed   By: Lajean Manes M.D.   On: 01/15/2021 14:13     Assessment and Recommendation  80 y.o. 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  Continuation of management of atrial flutter with combination of oral metoprolol beta-blocker as currently given without change.  Will discontinue diltiazem drip 2.  Further treatment and supportive care current illness and infection 3.  Begin cardiac rehabilitation 4.  Continue anticoagulation for further risk reduction stroke with atrial flutter without change today 5.  No further  cardiac diagnostics necessary at this time 6.  Further adjustments of medication management after above  Signed, Serafina Royals M.D. FACC

## 2021-01-22 NOTE — Progress Notes (Signed)
Physical Therapy Treatment Patient Details Name: Gina Chambers MRN: [id number] DOB: [birth year] Today's Date: 01/22/2021   History of Present Illness 80 y.o. female with medical history significant of hypertension, diabetes mellitus, COPD, asthma, GERD, hypothyroidism, OSA on CPAP, DVT on Xarelto, dCHF, vertigo, GI bleeding, COVID-19 infection, cholecystitis, who presents with abdominal pain. S/p cholecystostomy tube on 9/26.    PT Comments    Patient agreeable to PT. She reports she feels much weaker than her baseline. Patient needs minimal assistance for bed mobility and moderate assistance for standing. In standing position, heart rate immediately goes from 106bpm to 145bpm and sustained for ~ 15 seconds. With seated rest breaks, heart rate decreased back down close to 100bpm. With second standing bout, heart rate again increased and sustained at 145bpm. Patient feels fatigued, dizziness, and mild shortness of breath with activity.   Patient is currently needing increased assistance with basic mobility. She is hopeful to be discharged home. PT will continue to follow to maximize independence and decrease caregiver burden. We might need to consider SNF placement if independence and activity tolerance not improved.    Recommendations for follow up therapy are one component of a multi-disciplinary discharge planning process, led by the attending physician.  Recommendations may be updated based on patient status, additional functional criteria and insurance authorization.  Follow Up Recommendations  Home health PT;Supervision - Intermittent     Equipment Recommendations  None recommended by PT    Recommendations for Other Services       Precautions / Restrictions Precautions Precautions: Fall Precaution Comments: monitor HR Restrictions Weight Bearing Restrictions: No     Mobility  Bed Mobility Overal bed mobility: Needs Assistance Bed Mobility: Supine to Sit;Sit to Supine      Supine to sit: Min assist Sit to supine: Min assist   General bed mobility comments: assistance for trunk suppor to sit upright. intermittent assistance for LE support to return to bed. verbal cues for technique. increased time and effort required for all mobility efforts.    Transfers Overall transfer level: Needs assistance Equipment used: Rolling walker (2 wheeled) Transfers: Sit to/from Stand Sit to Stand: Mod assist         General transfer comment: 2 bouts of standing performed. verbal cues for hand placement for safety.  Ambulation/Gait             General Gait Details: ambulation deferred as standing heart rate immediately goes up to 145bpm and sustined for ~ 15 seconds. with seated rest break, HR decreased but again increased to 145bpm with second standing bout and sustined for 10 seconds. patient feels fatigued and with mild increased work of breathing also noted. Sp02 88-90's with activity on room air   Stairs             Wheelchair Mobility    Modified Rankin (Stroke Patients Only)       Balance Overall balance assessment: Needs assistance Sitting-balance support: No upper extremity supported Sitting balance-Leahy Scale: Good     Standing balance support: Bilateral upper extremity supported Standing balance-Leahy Scale: Fair Standing balance comment: patient relying on rolling walker for support in standing                            Cognition Arousal/Alertness: Awake/alert Behavior During Therapy: Restless Overall Cognitive Status: Within Functional Limits for tasks assessed  Exercises General Exercises - Lower Extremity Ankle Circles/Pumps: AROM;Strengthening;Both;10 reps;Seated Long Arc Quad: AAROM;Strengthening;Both;5 reps;Seated Other Exercises Other Exercises: verbal and visual cues for exercise technique. encouraged patient to get OOB to chair daily with staff  assistance for upright conditioning.    General Comments        Pertinent Vitals/Pain Pain Assessment: Faces Faces Pain Scale: Hurts a little bit Pain Location: incision/tube site Pain Descriptors / Indicators: Discomfort Pain Intervention(s): Patient requesting pain meds-RN notified    Home Living                      Prior Function            PT Goals (current goals can now be found in the care plan section) Acute Rehab PT Goals Patient Stated Goal: increase independence PT Goal Formulation: With patient Time For Goal Achievement: 02/02/21 Potential to Achieve Goals: Fair Progress towards PT goals: Progressing toward goals    Frequency    Min 2X/week      PT Plan Current plan remains appropriate    Co-evaluation              AM-PAC PT "6 Clicks" Mobility   Outcome Measure  Help needed turning from your back to your side while in a flat bed without using bedrails?: A Little Help needed moving from lying on your back to sitting on the side of a flat bed without using bedrails?: A Little Help needed moving to and from a bed to a chair (including a wheelchair)?: A Lot Help needed standing up from a chair using your arms (e.g., wheelchair or bedside chair)?: A Lot Help needed to walk in hospital room?: A Little Help needed climbing 3-5 steps with a railing? : A Little 6 Click Score: 16    End of Session   Activity Tolerance: Patient limited by fatigue Patient left: in bed;with call bell/phone within reach;with bed alarm set   PT Visit Diagnosis: Muscle weakness (generalized) (M62.81);Difficulty in walking, not elsewhere classified (R26.2)     Time: 7782-4235 PT Time Calculation (min) (ACUTE ONLY): 45 min  Charges:  $Therapeutic Activity: 38-52 mins                     Gina Chambers, PT, MPT    Gina Chambers 01/22/2021, 11:42 AM

## 2021-01-23 DIAGNOSIS — R531 Weakness: Secondary | ICD-10-CM

## 2021-01-23 DIAGNOSIS — J449 Chronic obstructive pulmonary disease, unspecified: Secondary | ICD-10-CM | POA: Diagnosis not present

## 2021-01-23 DIAGNOSIS — K810 Acute cholecystitis: Secondary | ICD-10-CM | POA: Diagnosis not present

## 2021-01-23 DIAGNOSIS — E039 Hypothyroidism, unspecified: Secondary | ICD-10-CM | POA: Diagnosis not present

## 2021-01-23 LAB — GLUCOSE, CAPILLARY
Glucose-Capillary: 134 mg/dL — ABNORMAL HIGH (ref 70–99)
Glucose-Capillary: 256 mg/dL — ABNORMAL HIGH (ref 70–99)
Glucose-Capillary: 171 mg/dL — ABNORMAL HIGH (ref 70–99)
Glucose-Capillary: 157 mg/dL — ABNORMAL HIGH (ref 70–99)

## 2021-01-23 MED ORDER — OXYCODONE-ACETAMINOPHEN 5-325 MG PO TABS: 1.0000 | ORAL_TABLET | Freq: Two times a day (BID) | ORAL | Status: DC | PRN

## 2021-01-23 MED ORDER — DILTIAZEM HCL ER COATED BEADS 180 MG PO CP24
180.0000 mg | ORAL_CAPSULE | Freq: Two times a day (BID) | ORAL | Status: DC
  Administered 2021-01-23 – 2021-01-31 (×17): 180 mg via ORAL
  Filled 2021-01-23 (×17): qty 1

## 2021-01-23 MED ORDER — OXYCODONE-ACETAMINOPHEN 5-325 MG PO TABS: 1.0000 | ORAL_TABLET | Freq: Three times a day (TID) | ORAL | Status: DC | PRN

## 2021-01-23 MED ORDER — IBUPROFEN 400 MG PO TABS
600.0000 mg | ORAL_TABLET | Freq: Four times a day (QID) | ORAL | Status: DC | PRN
  Administered 2021-01-23 – 2021-01-30 (×8): 600 mg via ORAL
  Filled 2021-01-23 (×10): qty 2

## 2021-01-23 MED ORDER — FLEET ENEMA 7-19 GM/118ML RE ENEM
1.0000 | ENEMA | RECTAL | Status: AC
  Administered 2021-01-23: 1 via RECTAL

## 2021-01-23 MED ORDER — AMOXICILLIN-POT CLAVULANATE 875-125 MG PO TABS
1.0000 | ORAL_TABLET | Freq: Two times a day (BID) | ORAL | Status: AC
  Administered 2021-01-23 – 2021-01-29 (×13): 1 via ORAL
  Filled 2021-01-23 (×13): qty 1

## 2021-01-23 MED ORDER — IBUPROFEN 400 MG PO TABS: 600.0000 mg | ORAL_TABLET | Freq: Four times a day (QID) | ORAL | Status: DC | PRN

## 2021-01-23 NOTE — Assessment & Plan Note (Signed)
Due to sepsis.  Improving slowly

## 2021-01-23 NOTE — Assessment & Plan Note (Signed)
Patient's heart rate at rest in 110s to 130s.  Cardiology following.  We will increase Cardizem CD to 180 mg twice a day.  Continue metoprolol and Xarelto

## 2021-01-23 NOTE — Progress Notes (Signed)
Physical Therapy Treatment Patient Details Name: SHATONYA PASSON MRN: [id number] DOB: [birth year] Today's Date: 01/23/2021   History of Present Illness 80 y.o. female with medical history significant of hypertension, diabetes mellitus, COPD, asthma, GERD, hypothyroidism, OSA on CPAP, DVT on Xarelto, dCHF, vertigo, GI bleeding, COVID-19 infection, cholecystitis, who presents with abdominal pain. S/p cholecystostomy tube on 9/26.    PT Comments    Patient is cooperative and agreeable to PT. Patient continues to require physical assistance with all mobility. Four standing bouts performed with standing tolerance of ~ 1 minute with each stand using rolling walker for support. Heart rate also continues to increase with exertional activity and is high as 150bpm in standing position, sustaining at 146-149bpm. With seated rest breaks, heart rate does decrease down to 110-118bpm seated at edge of bed. She is deconditioned with limited overall activity tolerance.  At this time, the patient is no longer at her baseline level of functional mobility. She is unsafe to return home.  Discharge recommendations are updated to reflect recommendation for SNF for short term rehab. PT will continue to follow to maximize independence and facilitate return to prior level of function. I discussed my recommendation for SNF with the patient and she is agreeable.     Recommendations for follow up therapy are one component of a multi-disciplinary discharge planning process, led by the attending physician.  Recommendations may be updated based on patient status, additional functional criteria and insurance authorization.  Follow Up Recommendations  SNF     Equipment Recommendations  Rolling walker with 5" wheels    Recommendations for Other Services       Precautions / Restrictions Precautions Precautions: Fall Precaution Comments: monitor HR Restrictions Weight Bearing Restrictions: No     Mobility  Bed  Mobility Overal bed mobility: Needs Assistance Bed Mobility: Supine to Sit;Sit to Supine     Supine to sit: Mod assist Sit to supine: Min assist   General bed mobility comments: assistance for trunk support to sit upright. patient complains of mild pain around site of drain with bed mobility efforts. Min A required for return to bed for LE support. increased time and effort requried with bed mobility. heart rate increased and sustained at 146 with sitting upright. decreased into 110's after 4-5 minutes. patient reports feeling mild dizziness initially that subsided with increased sitting time.    Transfers Overall transfer level: Needs assistance Equipment used: Rolling walker (2 wheeled) Transfers: Sit to/from Stand Sit to Stand: Min guard         General transfer comment: verbal cues for hand placement and technique for standing. patient needs Min guard assistance and management of lines/leads/drain. 4 bouts of standing performed. patient reports feeling mild dizziness and LE weakness in standing position. heart increases as high as 150 and sustined at 147-149 with standing activity. further ambulation deferred due to high heart rate and generalized weakness. seated rest breaks required between bouts of standing where heart rate decreased down to 110-118bpm.  Ambulation/Gait             General Gait Details: unable to progress to walking due to elevated heart rate sustianed with standing and generalized fatigue and minimal activity tolerance. standing tolerance ~ 1 minute with each standing bout performed x 4 bouts   Stairs             Wheelchair Mobility    Modified Rankin (Stroke Patients Only)       Balance  Cognition Arousal/Alertness: Awake/alert Behavior During Therapy: WFL for tasks assessed/performed Overall Cognitive Status: Within Functional Limits for tasks assessed                                  General Comments: patient able to follow commands without difficulty      Exercises      General Comments        Pertinent Vitals/Pain Pain Assessment: Faces Faces Pain Scale: Hurts little more Pain Location: stomach cramping Pain Descriptors / Indicators: Cramping Pain Intervention(s): Limited activity within patient's tolerance    Home Living                      Prior Function            PT Goals (current goals can now be found in the care plan section) Acute Rehab PT Goals Patient Stated Goal: to regain independence PT Goal Formulation: With patient Time For Goal Achievement: 02/02/21 Potential to Achieve Goals: Fair Progress towards PT goals: Progressing toward goals    Frequency    Min 2X/week      PT Plan Discharge plan needs to be updated    Co-evaluation              AM-PAC PT "6 Clicks" Mobility   Outcome Measure  Help needed turning from your back to your side while in a flat bed without using bedrails?: A Little Help needed moving from lying on your back to sitting on the side of a flat bed without using bedrails?: A Little Help needed moving to and from a bed to a chair (including a wheelchair)?: A Lot Help needed standing up from a chair using your arms (e.g., wheelchair or bedside chair)?: A Little Help needed to walk in hospital room?: A Lot Help needed climbing 3-5 steps with a railing? : Total 6 Click Score: 14    End of Session   Activity Tolerance: Patient limited by fatigue Patient left: in bed;with call bell/phone within reach;with bed alarm set Nurse Communication: Mobility status PT Visit Diagnosis: Muscle weakness (generalized) (M62.81);Difficulty in walking, not elsewhere classified (R26.2)     Time: 6378-5885 PT Time Calculation (min) (ACUTE ONLY): 61 min  Charges:  $Therapeutic Activity: 53-67 mins                     Minna Merritts, PT, MPT    Percell Locus 01/23/2021, 12:03  PM

## 2021-01-23 NOTE — Progress Notes (Signed)
  Progress Note    Gina Chambers   MBT:[id number]  DOB: 1940/09/16  DOA: 01/15/2021     8 Date of Service: 01/23/2021     Subjective:  Improvement noted in Leukocytosis.  She is somewhat more awake and eating and before.  She is not in any bowel movement yet although passing gas  Hospital Problems * Acute cholecystitis S/p IR guided cholecystostomy tube on 9/26.  Tube will need to stay in place for 6 to 8 weeks per surgery.  Change IV Unasyn to oral Augmentin.  Paroxysmal atrial flutter (HCC) Patient's heart rate at rest in 110s to 130s.  Cardiology following.  We will increase Cardizem CD to 180 mg twice a day.  Continue metoprolol and Xarelto  Generalized weakness Multifactorial.  PT and OT recommend SNF now.  TOC aware and working on placement  Leukocytosis Due to sepsis.  Improving slowly     Objective Vital signs were reviewed and unremarkable except tachycardia Vitals:   01/23/21 0740 01/23/21 0818 01/23/21 1100 01/23/21 1500  BP:      Pulse:  (!) 105  (!) 105  Resp:      Temp:   98.7 F (37.1 C) 98.7 F (37.1 C)  TempSrc:   Axillary Axillary  SpO2:      Weight: 91.6 kg     Height:       91.6 kg  Exam Physical Exam   General exam: No acute distress.  Looks chronically ill Respiratory system: Clear to auscultation. Respiratory effort normal. Cardiovascular system:  Irregularly irregular heart sounds, tachycardic Gastrointestinal system: Obese, nondistended, cholecystostomy tube in place Central nervous system: Alert and oriented. No focal neurological deficits. Extremities: Symmetric 5 x 5 power. Skin: No rashes, lesions or ulcers Psychiatry: Judgement and insight appear normal. Mood & affect appropriate.   Labs / Other Information My review of labs, imaging, notes and other tests shows no new significant findings.     Time spent: 35 minutes [facility] 01/23/2021, 4:34 PM

## 2021-01-23 NOTE — Progress Notes (Signed)
[facility] Cardiology [facility] Encounter Note  Patient: Gina Chambers / Admit Date: 01/15/2021 / Date of Encounter: 01/23/2021, 7:28 AM   Subjective: Patient overall slowly getting better from current issues infection and abdominal discomfort.  Patient's heart rate control is now much better since she is converted into normal sinus rhythm with current medical regimen.  Telemetry shows normal sinus rhythm on current medical regimen for which she tolerates well  Echocardiogram showing normal LV systolic function with normally functioning prosthetic valves and ejection fraction of 60% Review of Systems: Positive for: Weakness fatigue Negative for: Vision change, hearing change, syncope, dizziness, nausea, vomiting,diarrhea, bloody stool, stomach pain, cough, congestion, diaphoresis, urinary frequency, urinary pain,skin lesions, skin rashes Others previously listed  Objective: Telemetry: Atrial flutter with variable heart rate Physical Exam: Blood pressure (!) 117/46, pulse 72, temperature 99.2 F (37.3 C), temperature source Axillary, resp. rate 19, height 5\' 3"  (1.6 m), weight 91.7 kg, SpO2 97 %. Body mass index is 35.81 kg/m. General: Well developed, well nourished, in no acute distress. Head: Normocephalic, atraumatic, sclera non-icteric, no xanthomas, nares are without discharge. Neck: No apparent masses Lungs: Normal respirations with no wheezes, no rhonchi, no rales , no crackles   Heart: Irregular trace rate and rhythm, normal S1 S2, no murmur, no rub, no gallop, PMI is normal size and placement, carotid upstroke normal without bruit, jugular venous pressure normal Abdomen: Soft, non-tender, non-distended with normoactive bowel sounds. No hepatosplenomegaly. Abdominal aorta is normal size without bruit Extremities: Trace edema, no clubbing, no cyanosis, no ulcers,  Peripheral: 2+ radial, 2+ femoral, 2+ dorsal pedal pulses Neuro: Alert and oriented. Moves all extremities  spontaneously. Psych:  Responds to questions appropriately with a normal affect.   Intake/Output Summary (Last 24 hours) at 01/23/2021 0728 Last data filed at 01/23/2021 0508 Gross per 24 hour  Intake 590 ml  Output 2450 ml  Net -1860 ml     Inpatient Medications:  . aspirin EC  81 mg Oral QHS  . diltiazem  120 mg Oral BID  . fluticasone  2 puff Inhalation BID  . gabapentin  100 mg Oral QID  . insulin aspart  0-5 Units Subcutaneous QHS  . insulin aspart  0-9 Units Subcutaneous TID WC  . insulin glargine-yfgn  10 Units Subcutaneous Daily   And  . insulin glargine-yfgn  5 Units Subcutaneous QHS  . levothyroxine  125 mcg Oral Q0600  . metoprolol succinate  50 mg Oral Daily  . montelukast  10 mg Oral QHS  . multivitamin with minerals   Oral Daily  . pantoprazole  20 mg Oral Daily  . rivaroxaban  20 mg Oral Q supper  . sodium chloride flush  5 mL Intracatheter Q8H  . spironolactone  25 mg Oral Daily  . torsemide  40 mg Oral BID   Infusions:  . sodium chloride    . ampicillin-sulbactam (UNASYN) IV 3 g (01/23/21 0538)    Labs: Recent Labs    01/21/21 0530 01/22/21 0609  NA 129* 131*  K 3.9 4.0  CL 92* 91*  CO2 28 28  GLUCOSE 140* 143*  BUN 22 22  CREATININE 0.87 0.90  CALCIUM 8.3* 8.4*  MG 2.0 2.0    Recent Labs    01/20/21 0908 01/21/21 0530  AST 18 17  ALT 13 12  ALKPHOS 82 90  BILITOT 1.3* 1.0  PROT 5.9* 5.6*  ALBUMIN 2.5* 2.3*    Recent Labs    01/21/21 0530 01/22/21 0609  WBC 22.9* 21.1*  NEUTROABS 13.6* 12.8*  HGB 11.3* 11.3*  HCT 33.6* 33.5*  MCV 87.3 86.6  PLT 167 198    No results for input(s): CKTOTAL, CKMB, TROPONINI in the last 72 hours. Invalid input(s): POCBNP No results for input(s): HGBA1C in the last 72 hours.   Weights: Filed Weights   01/19/21 0551 01/20/21 0500 01/21/21 0500  Weight: 88.7 kg 88.5 kg 91.7 kg     Radiology/Studies:  CT ABDOMEN PELVIS WO CONTRAST  Result Date: 01/19/2021 CLINICAL DATA:  Abdominal  distention. Cholecystitis status post percutaneous cholecystostomy tube placement. Continued leukocytosis, pain. EXAM: CT ABDOMEN AND PELVIS WITHOUT CONTRAST TECHNIQUE: Multidetector CT imaging of the abdomen and pelvis was performed following the standard protocol without IV contrast. COMPARISON:  01/15/2021 FINDINGS: Lower chest: New moderate right pleural effusion with compressive atelectasis/infiltrate in the right lower lobe. Hepatobiliary: Post percutaneous cholecystostomy tube placement. The gallbladder is decompressed. No biliary ductal dilatation. Nodular contours of the liver compatible with cirrhosis. Pancreas: No focal abnormality or ductal dilatation. Spleen: No focal abnormality.  Normal size. Adrenals/Urinary Tract: Perinephric stranding bilaterally, increasing since prior study. No hydronephrosis. Adrenal glands and urinary bladder grossly unremarkable. Stomach/Bowel: Postoperative changes in the stomach. No bowel obstruction or acute finding. Vascular/Lymphatic: Aortic atherosclerosis. No evidence of aneurysm or adenopathy. Reproductive: Uterus and adnexa unremarkable.  No mass. Other: Trace free fluid in the cul-de-sac. Increasing perihepatic ascites. Musculoskeletal: No acute bony abnormality. IMPRESSION: Interval percutaneous cholecystostomy tube placement with decompression of the gallbladder. Changes of cirrhosis.  Increasing perihepatic ascites. Moderate right pleural effusion with right lower lobe atelectasis or infiltrate. Electronically Signed   By: Rolm Baptise M.D.   On: 01/19/2021 10:30   CT ABDOMEN PELVIS WO CONTRAST  Result Date: 01/15/2021 CLINICAL DATA:  Abdominal distension and right upper quadrant abdominal pain. History of cholecystostomy tube removal on 12/06/2020 EXAM: CT ABDOMEN AND PELVIS WITHOUT CONTRAST TECHNIQUE: Multidetector CT imaging of the abdomen and pelvis was performed following the standard protocol without IV contrast. COMPARISON:  CT 09/23/2020, ultrasound  01/15/2021 FINDINGS: Lower chest: Bibasilar dependent atelectasis. Heart size is normal. No pericardial effusion. Hepatobiliary: Gallbladder is moderately distended with multiple hyperdense gallstones. Gallbladder wall appears diffusely thickened with pericholecystic inflammatory fat stranding and small volume of adjacent fluid. Unenhanced liver is within normal limits. Small volume perihepatic ascites. Pancreas: Unremarkable. No pancreatic ductal dilatation or surrounding inflammatory changes. Spleen: Unremarkable. Adrenals/Urinary Tract: Slightly nodular configuration of the left adrenal gland is unchanged. Unremarkable right adrenal gland. Bilateral kidneys are within normal limits. No renal stone or hydronephrosis. Urinary bladder is unremarkable. Stomach/Bowel: Postsurgical changes again noted at the stomach. No evidence of bowel wall thickening, distention, or inflammatory changes. Vascular/Lymphatic: Scattered aortoiliac atherosclerotic calcifications without aneurysm. No abdominopelvic lymphadenopathy. Reproductive: Uterus and bilateral adnexa are unremarkable. Other: No organized abdominopelvic fluid collection. No pneumoperitoneum. No abdominal wall hernia. Musculoskeletal: No new or acute bony findings. IMPRESSION: 1. Appearance of the gallbladder remains suspicious for acute cholecystitis. 2. Small volume perihepatic ascites. Aortic Atherosclerosis (ICD10-I70.0). Electronically Signed   By: Davina Poke D.O.   On: 01/15/2021 15:10   DG Chest 2 View  Result Date: 01/15/2021 CLINICAL DATA:  Pt arrived via [facility] with c/o upper abd pain, started a juice cleanse last Wednesday, states it was apple juice, Pt c/o N/V and abd pain. 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 Cardiac silhouette is normal in size. No  mediastinal or hilar  masses or evidence of adenopathy. Dependent opacity at both lung bases consistent with atelectasis. Remainder of the lungs is clear. No convincing pleural effusion and no pneumothorax. Skeletal structures are intact. IMPRESSION: No acute cardiopulmonary disease. Electronically Signed   By: Lajean Manes M.D.   On: 01/15/2021 13:37   DG Shoulder Right  Result Date: 01/15/2021 CLINICAL DATA:  Right shoulder pain EXAM: RIGHT SHOULDER - 2+ VIEW COMPARISON:  None. FINDINGS: No fracture or malalignment. Moderate AC joint degenerative change with mild glenohumeral degenerative change. Small amount of calcific tendinopathy. IMPRESSION: 1. Degenerative changes without acute osseous abnormality. 2. Calcific tendinopathy Electronically Signed   By: Donavan Foil M.D.   On: 01/15/2021 18:08   US Venous Img Lower Bilateral (DVT)  Result Date: 01/20/2021 CLINICAL DATA:  Positive D-dimer.  History of DVT EXAM: BILATERAL LOWER EXTREMITY VENOUS DOPPLER ULTRASOUND TECHNIQUE: Gray-scale sonography with compression, as well as color and duplex ultrasound, were performed to evaluate the deep venous system(s) from the level of the common femoral vein through the popliteal and proximal calf veins. COMPARISON:  05/29/2016 right lower extremity Doppler ultrasound. FINDINGS: VENOUS Right: Normal compressibility of the common femoral, superficial femoral, and popliteal veins, as well as the visualized calf veins. Visualized portions of profunda femoral vein and great saphenous vein unremarkable. No filling defects to suggest DVT on grayscale or color Doppler imaging. Doppler waveforms show normal direction of venous flow, normal respiratory plasticity and response to augmentation. Left: Peripheral mid level echoes with incomplete compressibility at the left popliteal vein. The other left lower extremity deep veins showed normal color filling and compressibility. Present respiratory phasicity. IMPRESSION: 1. Nonocclusive clot in the  left popliteal vein that is likely nonacute. 2. Negative for right lower extremity DVT. Electronically Signed   By: Jorje Guild M.D.   On: 01/20/2021 06:28   IR Perc Cholecystostomy  Result Date: 01/16/2021 CLINICAL DATA:  Recurrent acute cholecystitis and status post prior percutaneous cholecystostomy tube placement on 09/26/2020. The tube was removed on 12/06/2020 after the patient insisted it be removed. She has developed recurrent acute cholecystitis and sepsis and is not a candidate currently for cholecystectomy. EXAM: PERCUTANEOUS CHOLECYSTOSTOMY COMPARISON:  CT of the abdomen and pelvis on 01/15/2021 ANESTHESIA/SEDATION: Moderate (conscious) sedation was employed during this procedure. A total of Versed 1.0 mg and Fentanyl 100 mcg was administered intravenously. Moderate Sedation Time: 25 minutes. The patient's level of consciousness and vital signs were monitored continuously by radiology nursing throughout the procedure under my direct supervision. CONTRAST:  37mL OMNIPAQUE IOHEXOL 350 MG/ML SOLN MEDICATIONS: No additional medications were administered. FLUOROSCOPY TIME:  30 seconds.  8.1 mGy. PROCEDURE: The procedure, risks, benefits, and alternatives were explained to the patient. Questions regarding the procedure were encouraged and answered. The patient understands and consents to the procedure. A time-out was performed prior to initiating the procedure. The right abdominal wall was prepped with chlorhexidine in a sterile fashion, and a sterile drape was applied covering the operative field. A sterile gown and sterile gloves were used for the procedure. Local anesthesia was provided with 1% Lidocaine. Ultrasound image documentation was performed. Fluoroscopy during the procedure and fluoro spot radiograph confirms appropriate catheter position. Ultrasound was utilized to localize the gallbladder. Under direct ultrasound guidance, an 18 gauge trocar needle was advanced into the gallbladder lumen.  Aspiration was performed and a bile sample sent for culture studies. A small amount of diluted contrast material was injected. A guide wire was then advanced into the gallbladder.  Percutaneous tract dilatation was then performed over a guide wire to 10-French. A 10-French pigtail drainage catheter was then advanced into the gallbladder lumen under fluoroscopy. Catheter was formed and injected with contrast material to confirm position. The catheter was flushed and connected to a gravity drainage bag. It was secured at the skin with a Prolene retention suture and Stat-Lock device. COMPLICATIONS: None FINDINGS: After needle puncture of the gallbladder, a bile sample was aspirated and sent for culture. Bile return was dark in color with debris. The cholecystostomy tube was advanced into the gallbladder lumen and formed. It is now draining bile. This tube will be left to gravity drainage. IMPRESSION: Percutaneous cholecystostomy with placement of 10-French drainage catheter into the gallbladder lumen. This was left to gravity drainage. Electronically Signed   By: Aletta Edouard M.D.   On: 01/16/2021 16:17   ECHOCARDIOGRAM COMPLETE  Result Date: 01/20/2021    ECHOCARDIOGRAM REPORT   Patient Name:   SUDIE BANDEL Date of Exam: 01/20/2021 Medical Rec #:  [id number]     Height:       63.0 in Accession #:    [id number]    Weight:       195.1 lb Date of Birth:  January 08, 1941    BSA:          1.913 m Patient Age:    48 years      BP:           96/56 mmHg Patient Gender: F             HR:           144 bpm. Exam Location:  [facility] Procedure: 2D Echo, Cardiac Doppler and Color Doppler Indications:     Atrial Flutter I48.92  History:         Patient has prior history of Echocardiogram examinations, most                  recent 09/26/2020. CHF; Risk Factors:Diabetes. DVT.                   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  Sonographer:     Sherrie Sport Referring Phys:  [id number]  BRENDA MORRISON Diagnosing Phys: Serafina Royals MD IMPRESSIONS  1. Left ventricular ejection fraction, by estimation, is 65 to 70%. The left ventricle has normal function. The left ventricle has no regional wall motion abnormalities. Left ventricular diastolic parameters were normal.  2. Right ventricular systolic function is normal. The right ventricular size is normal.  3. Left atrial size was moderately dilated.  4. The mitral valve has been repaired/replaced. Trivial mitral valve regurgitation. 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 Echo findings are consistent with normal structure and function of the mitral valve prosthesis.  5. The aortic valve is normal in structure. Aortic valve regurgitation is trivial. FINDINGS  Left Ventricle: Left ventricular ejection fraction, by estimation, is 65 to 70%. The left ventricle has normal function. The left ventricle has no regional wall motion abnormalities. The left ventricular internal cavity size was small. There is no left ventricular hypertrophy. Left ventricular diastolic parameters were normal. Right Ventricle: The right ventricular size is normal. No increase in right ventricular wall thickness. Right ventricular systolic function is normal. Left Atrium: Left atrial size was moderately dilated. Right Atrium: Right atrial size was normal in size.  Pericardium: There is no evidence of pericardial effusion. Mitral Valve: The mitral valve has been repaired/replaced. Trivial mitral valve regurgitation. 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 Echo findings are consistent with normal structure and function of the mitral valve prosthesis. MV peak gradient, 13.0 mmHg. The mean mitral valve gradient is 6.0 mmHg. Tricuspid Valve: The tricuspid valve is normal in structure. Tricuspid valve regurgitation is trivial. Aortic Valve: The aortic valve is normal in structure. Aortic valve regurgitation is trivial. Aortic valve mean  gradient measures 17.3 mmHg. Aortic valve peak gradient measures 30.8 mmHg. Aortic valve area, by VTI measures 1.48 cm. Pulmonic Valve: The pulmonic valve was normal in structure. Pulmonic valve regurgitation is not visualized. Aorta: The aortic root and ascending aorta are structurally normal, with no evidence of dilitation. IAS/Shunts: No atrial level shunt detected by color flow Doppler.  LEFT VENTRICLE PLAX 2D LVIDd:         3.50 cm LVIDs:         2.50 cm LV PW:         1.10 cm LV IVS:        1.40 cm LVOT diam:     2.00 cm LV SV:         71 LV SV Index:   37 LVOT Area:     3.14 cm  RIGHT VENTRICLE RV Basal diam:  3.50 cm RV S prime:     14.80 cm/s TAPSE (M-mode): 3.5 cm LEFT ATRIUM             Index       RIGHT ATRIUM           Index LA diam:        2.80 cm 1.46 cm/m  RA Area:     15.60 cm LA Vol (A2C):   90.8 ml 47.45 ml/m RA Volume:   42.60 ml  22.26 ml/m LA Vol (A4C):   78.1 ml 40.82 ml/m LA Biplane Vol: 84.1 ml 43.95 ml/m  AORTIC VALVE                    PULMONIC VALVE AV Area (Vmax):    1.78 cm     PV Vmax:        0.30 m/s AV Area (Vmean):   1.99 cm     PV Peak grad:   0.4 mmHg AV Area (VTI):     1.48 cm     RVOT Peak grad: 3 mmHg AV Vmax:           277.67 cm/s AV Vmean:          191.333 cm/s AV VTI:            0.478 m AV Peak Grad:      30.8 mmHg AV Mean Grad:      17.3 mmHg LVOT Vmax:         157.00 cm/s LVOT Vmean:        121.000 cm/s LVOT VTI:          0.225 m LVOT/AV VTI ratio: 0.47  AORTA Ao Root diam: 2.00 cm MITRAL VALVE                TRICUSPID VALVE MV Area (PHT): 4.74 cm     TR Peak grad:   39.4 mmHg MV Area VTI:   1.76 cm     TR Vmax:        314.00 cm/s MV Peak grad:  13.0 mmHg MV Mean  grad:  6.0 mmHg     SHUNTS MV Vmax:       1.80 m/s     Systemic VTI:  0.22 m MV Vmean:      110.7 cm/s   Systemic Diam: 2.00 cm MV Decel Time: 160 msec MV E velocity: 180.00 cm/s MV A velocity: 88.60 cm/s MV E/A ratio:  2.03 Serafina Royals MD Electronically signed by Serafina Royals MD Signature  Date/Time: 01/20/2021/12:39:30 PM    Final    US ABDOMEN LIMITED RUQ (LIVER/GB)  Result Date: 01/15/2021 CLINICAL DATA:  Right upper quadrant pain since this morning. History of a gallbladder drainage in June 2022. Drain removed in August 2022. EXAM: ULTRASOUND ABDOMEN LIMITED RIGHT UPPER QUADRANT COMPARISON:  Ultrasound, 09/26/2020. FINDINGS: Gallbladder: Gallbladder is distended. There are dependent stones and sludge. Wall thickened to between 4 and 8 mm. Small amount of pericholecystic fluid. Common bile duct: Diameter: 6 mm Liver: No focal lesion identified. Within normal limits in parenchymal echogenicity. Portal vein is patent on color Doppler imaging with normal direction of blood flow towards the liver. Other: Trace perihepatic fluid. IMPRESSION: 1. Distended gallbladder with wall thickening and dependent stones and sludge as well as a small amount of pericholecystic fluid. Findings are similar to the prior ultrasound and support acute cholecystitis in the proper clinical setting. Electronically Signed   By: Lajean Manes M.D.   On: 01/15/2021 14:13     Assessment and Recommendation  80 y.o. 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  Continuation of management of atrial flutter with combination of oral metoprolol beta-blocker as currently given without change.   2.  Further treatment and supportive care current illness and infection 3.  Begin cardiac rehabilitation 4.  Continue anticoagulation for further risk reduction stroke with atrial flutter without change today 5.  No further cardiac diagnostics necessary at this time 6.  Further adjustments of  medication management after above  Signed, Serafina Royals M.D. FACC

## 2021-01-23 NOTE — Progress Notes (Signed)
[facility]) Hospital Liaison Note  Requested by Donnetta Hutching, RN [facility] Manager to meet with patient's family in order to provide information related to hospice services.   Met with daughter Peggyann Shoals. Provided information related to hospice services. Daughter voiced understanding of information provided. Contact number given to daughter in the event she has further questions.   Daughter states that she is awaiting final decision from care team before making decision related to hospice services.   Please do not hesitate to call with any hospice related questions or concerns.   Thank you for the opportunity to participate in this patient's care.  Bobbie "Loren Racer, RN, BSN [facility] Facility Liaison [phone]

## 2021-01-23 NOTE — Assessment & Plan Note (Signed)
Multifactorial.  PT and OT recommend SNF now.  TOC aware and working on placement

## 2021-01-23 NOTE — Progress Notes (Signed)
[facility]) Hospital Liaison Note  Notified by [facility] manager Donnetta Hutching, RN and Dr. Max Sane that plan is for patient to be placed at skilled nursing facility. Received request for [facility] Palliative services at skilled nursing facility after discharge.  [facility] hospital liaison will follow patient for discharge disposition.  Please call with any hospice or outpatient palliative care related questions.  Thank you for the opportunity to participate in this patient's care.  Bobbie "Loren Racer, [place], [place] [phone]

## 2021-01-23 NOTE — Assessment & Plan Note (Signed)
S/p IR guided cholecystostomy tube on 9/26.  Tube will need to stay in place for 6 to 8 weeks per surgery.  Change IV Unasyn to oral Augmentin.

## 2021-01-23 NOTE — NC FL2 (Signed)
[place] LEVEL OF CARE SCREENING TOOL     IDENTIFICATION  Patient Name: Gina Chambers Birthdate: 1940-07-21 Sex: female Admission Date (Current Location): 01/15/2021  [facility] and [place] Number:  Engineering geologist and Address:  [facility], [address], [place], [place] [address]      Provider Number: [id number]  Attending Physician Name and Address:  Max Sane, MD  Relative Name and Phone Number:  Peggyann Shoals (Daughter)   [phone]    Current Level of Care: Hospital Recommended Level of Care: [place] Prior Approval Number:    Date Approved/Denied:   PASRR Number: [id number]  Discharge Plan: SNF    Current Diagnoses: Patient Active Problem List   Diagnosis Date Noted   Paroxysmal atrial flutter ([place]) 01/20/2021   Acute cholecystitis 01/15/2021   Right shoulder pain 01/15/2021   COPD (chronic obstructive pulmonary disease) (HCC)    DVT (deep venous thrombosis) (HCC)    OSA on CPAP    Chronic diastolic CHF (congestive heart failure) ([place])    Sepsis ([place])    Inguinal abscess    Anticoagulated    COVID-19 virus infection 09/24/2020   Type 2 diabetes mellitus without complication ([place]) 32/95/1884   Hyponatremia 09/24/2020   Cholecystitis 09/23/2020   Chest pain 12/24/2018   Hypoglycemia 11/07/2017   GIB (gastrointestinal bleeding) 11/07/2016   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) ([place]) 06/19/2016   COPD exacerbation ([place]) 05/12/2016   Community acquired pneumonia 05/12/2016   Leukocytosis 05/12/2016   Generalized weakness 05/12/2016   Acquired hypothyroidism 12/15/2015   Essential hypertension 12/15/2015    Orientation RESPIRATION BLADDER Height & Weight     Self, Time, Situation, Place  Normal External catheter Weight: 91.6 kg Height:  5\' 3"  (160 cm)  BEHAVIORAL SYMPTOMS/MOOD NEUROLOGICAL BOWEL NUTRITION STATUS        Diet  AMBULATORY STATUS COMMUNICATION OF  NEEDS Skin   Limited Assist Verbally                         Personal Care Assistance Level of Assistance  Bathing, Feeding, Dressing Bathing Assistance: Limited assistance Feeding assistance: Limited assistance Dressing Assistance: Limited assistance     Functional Limitations Info  Sight, Hearing, Speech Sight Info: Impaired Hearing Info: Adequate Speech Info: Adequate    SPECIAL CARE FACTORS FREQUENCY  PT (By licensed PT), OT (By licensed OT)     PT Frequency: 5X WEEK OT Frequency: 5X WEEK            Contractures Contractures Info: Not present    Additional Factors Info  Code Status, Allergies Code Status Info: DNR Allergies Info: Multiple see list.           Current Medications (01/23/2021):  This is the current hospital active medication list Current Facility-Administered Medications  Medication Dose Route Frequency Provider Last Rate Last Admin   0.9 %  sodium chloride infusion   Intravenous PRN Sharen Hones, MD       acetaminophen (TYLENOL) tablet 650 mg  650 mg Oral Q6H PRN Ivor Costa, MD   650 mg at 01/19/21 2020   amoxicillin-clavulanate (AUGMENTIN) 875-125 MG per tablet 1 tablet  1 tablet Oral Q12H Max Sane, MD       Ampicillin-Sulbactam (UNASYN) 3 g in sodium chloride 0.9 % 100 mL IVPB  3 g Intravenous Q6H Manuella Ghazi, Vipul, MD 200 mL/hr at 01/23/21 1636 3 g at 01/23/21 1636   aspirin EC tablet 81  mg  81 mg Oral QHS Ivor Costa, MD   81 mg at 01/22/21 2309   dextromethorphan-guaiFENesin ([place] DM) 30-600 MG per 12 hr tablet 1 tablet  1 tablet Oral BID PRN Ivor Costa, MD       diltiazem (CARDIZEM CD) 24 hr capsule 180 mg  180 mg Oral BID Max Sane, MD       fluticasone (FLOVENT HFA) 110 MCG/ACT inhaler 2 puff  2 puff Inhalation BID Dallie Piles, RPH   2 puff at 01/23/21 0744   gabapentin (NEURONTIN) capsule 100 mg  100 mg Oral QID Ivor Costa, MD   100 mg at 01/23/21 1630   hydrALAZINE (APRESOLINE) injection 5 mg  5 mg Intravenous Q2H PRN Ivor Costa, MD       ibuprofen (ADVIL) tablet 600 mg  600 mg Oral Q6H PRN Max Sane, MD   600 mg at 01/23/21 1641   insulin aspart (novoLOG) injection 0-5 Units  0-5 Units Subcutaneous QHS Ivor Costa, MD       insulin aspart (novoLOG) injection 0-9 Units  0-9 Units Subcutaneous TID WC Ivor Costa, MD   2 Units at 01/23/21 1632   insulin glargine-yfgn (SEMGLEE) injection 10 Units  10 Units Subcutaneous Daily Ivor Costa, MD   10 Units at 01/23/21 9390   And   insulin glargine-yfgn (SEMGLEE) injection 5 Units  5 Units Subcutaneous QHS Ivor Costa, MD   5 Units at 01/22/21 2147   levalbuterol (XOPENEX) nebulizer solution 0.63 mg  0.63 mg Nebulization Q6H PRN Ivor Costa, MD   0.63 mg at 01/22/21 2324   levothyroxine (SYNTHROID) tablet 125 mcg  125 mcg Oral Q0600 Ivor Costa, MD   125 mcg at 01/23/21 0742   metoprolol succinate (TOPROL-XL) 24 hr tablet 50 mg  50 mg Oral Daily Ivor Costa, MD   50 mg at 01/23/21 0818   metoprolol tartrate (LOPRESSOR) injection 5 mg  5 mg Intravenous Q6H PRN Sharen Hones, MD       montelukast (SINGULAIR) tablet 10 mg  10 mg Oral QHS Ivor Costa, MD   10 mg at 01/22/21 2310   multivitamin with minerals tablet   Oral Daily Ivor Costa, MD   1 tablet at 01/23/21 0819   ondansetron (ZOFRAN) injection 4 mg  4 mg Intravenous Q8H PRN Ivor Costa, MD   4 mg at 01/18/21 0921   ondansetron (ZOFRAN) injection    PRN Aletta Edouard, MD   4 mg at 01/19/21 0855   pantoprazole (PROTONIX) EC tablet 20 mg  20 mg Oral Daily Ivor Costa, MD   20 mg at 01/23/21 3009   rivaroxaban (XARELTO) tablet 20 mg  20 mg Oral Q supper Dallie Piles, RPH   20 mg at 01/23/21 1631   sodium chloride flush (NS) 0.9 % injection 5 mL  5 mL Intracatheter Q8H Aletta Edouard, MD   5 mL at 01/23/21 1309   spironolactone (ALDACTONE) tablet 25 mg  25 mg Oral Daily Sharen Hones, MD   25 mg at 01/23/21 0818   torsemide (DEMADEX) tablet 40 mg  40 mg Oral BID Sharen Hones, MD   40 mg at 01/23/21 1630     Discharge  Medications: Please see discharge summary for a list of discharge medications.  Relevant Imaging Results:  Relevant Lab Results:   Additional Information SS# [id number]. [place]  Kerin Salen, RN

## 2021-01-24 DIAGNOSIS — I5032 Chronic diastolic (congestive) heart failure: Secondary | ICD-10-CM | POA: Diagnosis not present

## 2021-01-24 DIAGNOSIS — K810 Acute cholecystitis: Secondary | ICD-10-CM | POA: Diagnosis not present

## 2021-01-24 DIAGNOSIS — E039 Hypothyroidism, unspecified: Secondary | ICD-10-CM | POA: Diagnosis not present

## 2021-01-24 LAB — BASIC METABOLIC PANEL
Anion gap: 8 (ref 5–15)
BUN: 16 mg/dL (ref 8–23)
CO2: 33 mmol/L — ABNORMAL HIGH (ref 22–32)
Calcium: 8.2 mg/dL — ABNORMAL LOW (ref 8.9–10.3)
Chloride: 96 mmol/L — ABNORMAL LOW (ref 98–111)
Creatinine, Ser: 0.77 mg/dL (ref 0.44–1.00)
GFR, Estimated: >60 mL/min (ref >60)
Glucose, Bld: 136 mg/dL — ABNORMAL HIGH (ref 70–99)
Potassium: 3.1 mmol/L — ABNORMAL LOW (ref 3.5–5.1)
Sodium: 137 mmol/L (ref 135–145)

## 2021-01-24 LAB — CBC
HCT: 33.9 % — ABNORMAL LOW (ref 36.0–46.0)
Hemoglobin: 11.1 g/dL — ABNORMAL LOW (ref 12.0–15.0)
MCH: 29.1 pg (ref 26.0–34.0)
MCHC: 32.7 g/dL (ref 30.0–36.0)
MCV: 88.7 fL (ref 80.0–100.0)
Platelets: 213 10*3/uL (ref 150–400)
RBC: 3.82 MIL/uL — ABNORMAL LOW (ref 3.87–5.11)
RDW: 15.7 % — ABNORMAL HIGH (ref 11.5–15.5)
WBC: 22.6 10*3/uL — ABNORMAL HIGH (ref 4.0–10.5)
nRBC: 0 % (ref 0.0–0.2)

## 2021-01-24 LAB — GLUCOSE, CAPILLARY
Glucose-Capillary: 154 mg/dL — ABNORMAL HIGH (ref 70–99)
Glucose-Capillary: 217 mg/dL — ABNORMAL HIGH (ref 70–99)
Glucose-Capillary: 186 mg/dL — ABNORMAL HIGH (ref 70–99)
Glucose-Capillary: 224 mg/dL — ABNORMAL HIGH (ref 70–99)

## 2021-01-24 MED ORDER — POTASSIUM CHLORIDE CRYS ER 20 MEQ PO TBCR
40.0000 meq | EXTENDED_RELEASE_TABLET | Freq: Once | ORAL | Status: AC
  Administered 2021-01-24: 40 meq via ORAL
  Filled 2021-01-24: qty 2

## 2021-01-24 NOTE — Progress Notes (Signed)
Physical Therapy Treatment Patient Details Name: BATOOL MAJID MRN: [id number] DOB: 1940-05-03 Today's Date: 01/24/2021   History of Present Illness 80 y.o. female with medical history significant of hypertension, diabetes mellitus, COPD, asthma, GERD, hypothyroidism, OSA on CPAP, DVT on Xarelto, dCHF, vertigo, GI bleeding, COVID-19 infection, cholecystitis, who presents with abdominal pain. S/p cholecystostomy tube on 9/26.    PT Comments    Patient tired but was agreeable to PT. Heart rate much improved from previous days and was in acceptable range for ambulation efforts. Resting heart rate in the 80's-90's. Patient progressed to walking 38ft with rolling walker with Min guard assistance. Heart rate briefly up to 125 with walking, however was mostly in the 110's. Patient has mild shortness of breath with exertion with Sp02 in the 90's on room air. She remains deconditioned and would benefit from continued PT to maximize independence. SNF recommended at discharge.    Recommendations for follow up therapy are one component of a multi-disciplinary discharge planning process, led by the attending physician.  Recommendations may be updated based on patient status, additional functional criteria and insurance authorization.  Follow Up Recommendations  SNF     Equipment Recommendations  Rolling walker with 5" wheels    Recommendations for Other Services       Precautions / Restrictions Precautions Precautions: Fall Precaution Comments: monitor HR Restrictions Weight Bearing Restrictions: No     Mobility  Bed Mobility Overal bed mobility: Needs Assistance Bed Mobility: Supine to Sit;Sit to Supine     Supine to sit: Min assist;HOB elevated Sit to supine: Mod assist   General bed mobility comments: assistance for trunk support to sit upright with some right flank pain reported with rolling to the left. encouraged more of a logroll technique for pain management. increased assistance  required for return to bed for BLE support and patient fatigued after mobilizing.    Transfers Overall transfer level: Needs assistance Equipment used: Rolling walker (2 wheeled) Transfers: Sit to/from Stand Sit to Stand: Min guard         General transfer comment: 2 bouts of standing performed. Min guard for safety. heart rate 110's with standing activity  Ambulation/Gait Ambulation/Gait assistance: Min guard Gait Distance (Feet): 24 Feet Assistive device: Rolling walker (2 wheeled) Gait Pattern/deviations: Step-through pattern;Decreased stride length Gait velocity: decreased   General Gait Details: patient ambulated in the room with rolling walker and cues for safety. patient short of breath with exertion, however heart rate remains in the 110's with walking activity today. briefly noted heart rate as high as 125, however mostly in the 110's. activity tolerance limited by fatigue   Stairs             Wheelchair Mobility    Modified [place] (Stroke Patients Only)       Balance Overall balance assessment: Needs assistance Sitting-balance support: No upper extremity supported;Feet supported Sitting balance-Leahy Scale: Fair Sitting balance - Comments: Fair static sitting balance at EOB during seated grooming tasks   Standing balance support: Bilateral upper extremity supported;During functional activity Standing balance-Leahy Scale: Fair Standing balance comment: patient relying on rolling walker for support in standing                            Cognition Arousal/Alertness: Awake/alert Behavior During Therapy: WFL for tasks assessed/performed Overall Cognitive Status: Within Functional Limits for tasks assessed  General Comments: Pt agreeable to session. Pt tearful sitting EOB regarding medical course; extensive therapeutic listening provided by this author.      Exercises Other Exercises Other  Exercises: Seated therapy exercises consisting of AROM of scapular retraction, shoulder flexion, elbow flex/ext, and long arc quad    General Comments General comments (skin integrity, edema, etc.): Patient reports feeling dizziness and LE weakness in standing position, with HR increase to 126 (MAX). Further mobility deferred in setting of dizziness and LE weakness. Seated BP 139/55. At end of session, HR 99 (HR 83 at start of session) and BP 127/66      Pertinent Vitals/Pain Pain Assessment: Faces Faces Pain Scale: Hurts little more Pain Location: right flank with rolling Pain Descriptors / Indicators: Sore Pain Intervention(s): Limited activity within patient's tolerance    Home Living                      Prior Function            PT Goals (current goals can now be found in the care plan section) Acute Rehab PT Goals Patient Stated Goal: to walk again, build strength PT Goal Formulation: With patient Time For Goal Achievement: 02/02/21 Potential to Achieve Goals: Fair Progress towards PT goals: Progressing toward goals    Frequency    Min 2X/week      PT Plan Current plan remains appropriate    Co-evaluation              AM-PAC PT "6 Clicks" Mobility   Outcome Measure  Help needed turning from your back to your side while in a flat bed without using bedrails?: A Little Help needed moving from lying on your back to sitting on the side of a flat bed without using bedrails?: A Little Help needed moving to and from a bed to a chair (including a wheelchair)?: A Lot Help needed standing up from a chair using your arms (e.g., wheelchair or bedside chair)?: A Little Help needed to walk in hospital room?: A Lot Help needed climbing 3-5 steps with a railing? : Total 6 Click Score: 14    End of Session Equipment Utilized During Treatment:  (telemetry) Activity Tolerance: Patient tolerated treatment well Patient left: in bed;with call bell/phone within  reach;with bed alarm set Nurse Communication: Mobility status PT Visit Diagnosis: Muscle weakness (generalized) (M62.81);Difficulty in walking, not elsewhere classified (R26.2)     Time: 2751-7001 PT Time Calculation (min) (ACUTE ONLY): 48 min  Charges:  $Therapeutic Activity: 38-52 mins                     Minna Merritts, PT, MPT    Percell Locus 01/24/2021, 3:19 PM

## 2021-01-24 NOTE — Assessment & Plan Note (Signed)
Patient's heart rate at rest in 110s to 130s.  Cardiology following.  continue Cardizem CD 180 mg twice a day.  Continue metoprolol (increased to 50 mg today) and Xarelto

## 2021-01-24 NOTE — Plan of Care (Signed)
  Problem: Education: Goal: Knowledge of General Education information will improve Description: Including pain rating scale, medication(s)/side effects and non-pharmacologic comfort measures 01/24/2021 1254 by Cristela Blue, RN Outcome: Progressing 01/24/2021 1254 by Cristela Blue, RN Outcome: Progressing   Problem: Health Behavior/Discharge Planning: Goal: Ability to manage health-related needs will improve 01/24/2021 1254 by Cristela Blue, RN Outcome: Progressing 01/24/2021 1254 by Cristela Blue, RN Outcome: Progressing   Problem: Clinical Measurements: Goal: Ability to maintain clinical measurements within normal limits will improve 01/24/2021 1254 by Cristela Blue, RN Outcome: Progressing 01/24/2021 1254 by Cristela Blue, RN Outcome: Progressing Goal: Will remain free from infection 01/24/2021 1254 by Cristela Blue, RN Outcome: Progressing 01/24/2021 1254 by Cristela Blue, RN Outcome: Progressing Goal: Diagnostic test results will improve 01/24/2021 1254 by Cristela Blue, RN Outcome: Progressing 01/24/2021 1254 by Cristela Blue, RN Outcome: Progressing Goal: Respiratory complications will improve 01/24/2021 1254 by Cristela Blue, RN Outcome: Progressing 01/24/2021 1254 by Cristela Blue, RN Outcome: Progressing Goal: Cardiovascular complication will be avoided 01/24/2021 1254 by Cristela Blue, RN Outcome: Progressing 01/24/2021 1254 by Cristela Blue, RN Outcome: Progressing   Problem: Activity: Goal: Risk for activity intolerance will decrease 01/24/2021 1254 by Cristela Blue, RN Outcome: Progressing 01/24/2021 1254 by Cristela Blue, RN Outcome: Progressing   Problem: Nutrition: Goal: Adequate nutrition will be maintained 01/24/2021 1254 by Cristela Blue, RN Outcome: Progressing 01/24/2021 1254 by Cristela Blue, RN Outcome: Progressing   Problem: Coping: Goal: Level of anxiety will decrease 01/24/2021 1254 by Cristela Blue, RN Outcome:  Progressing 01/24/2021 1254 by Cristela Blue, RN Outcome: Progressing   Problem: Elimination: Goal: Will not experience complications related to bowel motility 01/24/2021 1254 by Cristela Blue, RN Outcome: Progressing 01/24/2021 1254 by Cristela Blue, RN Outcome: Progressing Goal: Will not experience complications related to urinary retention 01/24/2021 1254 by Cristela Blue, RN Outcome: Progressing 01/24/2021 1254 by Cristela Blue, RN Outcome: Progressing   Problem: Pain Managment: Goal: General experience of comfort will improve 01/24/2021 1254 by Cristela Blue, RN Outcome: Progressing 01/24/2021 1254 by Cristela Blue, RN Outcome: Progressing   Problem: Safety: Goal: Ability to remain free from injury will improve 01/24/2021 1254 by Cristela Blue, RN Outcome: Progressing 01/24/2021 1254 by Cristela Blue, RN Outcome: Progressing   Problem: Skin Integrity: Goal: Risk for impaired skin integrity will decrease 01/24/2021 1254 by Cristela Blue, RN Outcome: Progressing 01/24/2021 1254 by Cristela Blue, RN Outcome: Progressing

## 2021-01-24 NOTE — Assessment & Plan Note (Signed)
Continue Synthroid °

## 2021-01-24 NOTE — Progress Notes (Signed)
[facility] Cardiology [facility] Encounter Note  Patient: Gina Chambers / Admit Date: 01/15/2021 / Date of Encounter: 01/24/2021, 12:30 PM   Subjective: Patient overall slowly getting better from current issues infection and abdominal discomfort.  Heart rate control slightly less than perfect at this time with more variable heart rates and 110 to 120 bpm with more ambulation and improvements of above.   No evidence of congestive heart failure or acute coronary syndrome symptoms at this time  Echocardiogram showing normal LV systolic function with normally functioning prosthetic valves and ejection fraction of 60%  Review of Systems: Positive for: Weakness fatigue Negative for: Vision change, hearing change, syncope, dizziness, nausea, vomiting,diarrhea, bloody stool, stomach pain, cough, congestion, diaphoresis, urinary frequency, urinary pain,skin lesions, skin rashes Others previously listed  Objective: Telemetry: Atrial flutter with variable heart rate Physical Exam: Blood pressure (!) 141/59, pulse (!) 52, temperature 97.7 F (36.5 C), temperature source Oral, resp. rate 18, height 5\' 3"  (1.6 m), weight 91.6 kg, SpO2 99 %. Body mass index is 35.77 kg/m. General: Well developed, well nourished, in no acute distress. Head: Normocephalic, atraumatic, sclera non-icteric, no xanthomas, nares are without discharge. Neck: No apparent masses Lungs: Normal respirations with no wheezes, no rhonchi, no rales , no crackles   Heart: Irregular trace rate and rhythm, normal S1 S2, no murmur, no rub, no gallop, PMI is normal size and placement, carotid upstroke normal without bruit, jugular venous pressure normal Abdomen: Soft, non-tender, non-distended with normoactive bowel sounds. No hepatosplenomegaly. Abdominal aorta is normal size without bruit Extremities: Trace edema, no clubbing, no cyanosis, no ulcers,  Peripheral: 2+ radial, 2+ femoral, 2+ dorsal pedal pulses Neuro: Alert and oriented.  Moves all extremities spontaneously. Psych:  Responds to questions appropriately with a normal affect.   Intake/Output Summary (Last 24 hours) at 01/24/2021 1230 Last data filed at 01/24/2021 0300 Gross per 24 hour  Intake 926.93 ml  Output 2120 ml  Net -1193.07 ml     Inpatient Medications:  . amoxicillin-clavulanate  1 tablet Oral Q12H  . aspirin EC  81 mg Oral QHS  . diltiazem  180 mg Oral BID  . fluticasone  2 puff Inhalation BID  . gabapentin  100 mg Oral QID  . insulin aspart  0-5 Units Subcutaneous QHS  . insulin aspart  0-9 Units Subcutaneous TID WC  . insulin glargine-yfgn  10 Units Subcutaneous Daily   And  . insulin glargine-yfgn  5 Units Subcutaneous QHS  . levothyroxine  125 mcg Oral Q0600  . metoprolol succinate  50 mg Oral Daily  . montelukast  10 mg Oral QHS  . multivitamin with minerals   Oral Daily  . pantoprazole  20 mg Oral Daily  . rivaroxaban  20 mg Oral Q supper  . sodium chloride flush  5 mL Intracatheter Q8H  . spironolactone  25 mg Oral Daily  . torsemide  40 mg Oral BID   Infusions:  . sodium chloride      Labs: Recent Labs    01/22/21 0609 01/24/21 0452  NA 131* 137  K 4.0 3.1*  CL 91* 96*  CO2 28 33*  GLUCOSE 143* 136*  BUN 22 16  CREATININE 0.90 0.77  CALCIUM 8.4* 8.2*  MG 2.0  --     No results for input(s): AST, ALT, ALKPHOS, BILITOT, PROT, ALBUMIN in the last 72 hours.  Recent Labs    01/22/21 0609 01/24/21 0452  WBC 21.1* 22.6*  NEUTROABS 12.8*  --   HGB 11.3*  11.1*  HCT 33.5* 33.9*  MCV 86.6 88.7  PLT 198 213    No results for input(s): CKTOTAL, CKMB, TROPONINI in the last 72 hours. Invalid input(s): POCBNP No results for input(s): HGBA1C in the last 72 hours.   Weights: Filed Weights   01/20/21 0500 01/21/21 0500 01/23/21 0740  Weight: 88.5 kg 91.7 kg 91.6 kg     Radiology/Studies:  CT ABDOMEN PELVIS WO CONTRAST  Result Date: 01/19/2021 CLINICAL DATA:  Abdominal distention. Cholecystitis status post  percutaneous cholecystostomy tube placement. Continued leukocytosis, pain. EXAM: CT ABDOMEN AND PELVIS WITHOUT CONTRAST TECHNIQUE: Multidetector CT imaging of the abdomen and pelvis was performed following the standard protocol without IV contrast. COMPARISON:  01/15/2021 FINDINGS: Lower chest: New moderate right pleural effusion with compressive atelectasis/infiltrate in the right lower lobe. Hepatobiliary: Post percutaneous cholecystostomy tube placement. The gallbladder is decompressed. No biliary ductal dilatation. Nodular contours of the liver compatible with cirrhosis. Pancreas: No focal abnormality or ductal dilatation. Spleen: No focal abnormality.  Normal size. Adrenals/Urinary Tract: Perinephric stranding bilaterally, increasing since prior study. No hydronephrosis. Adrenal glands and urinary bladder grossly unremarkable. Stomach/Bowel: Postoperative changes in the stomach. No bowel obstruction or acute finding. Vascular/Lymphatic: Aortic atherosclerosis. No evidence of aneurysm or adenopathy. Reproductive: Uterus and adnexa unremarkable.  No mass. Other: Trace free fluid in the cul-de-sac. Increasing perihepatic ascites. Musculoskeletal: No acute bony abnormality. IMPRESSION: Interval percutaneous cholecystostomy tube placement with decompression of the gallbladder. Changes of cirrhosis.  Increasing perihepatic ascites. Moderate right pleural effusion with right lower lobe atelectasis or infiltrate. Electronically Signed   By: Rolm Baptise M.D.   On: 01/19/2021 10:30   CT ABDOMEN PELVIS WO CONTRAST  Result Date: 01/15/2021 CLINICAL DATA:  Abdominal distension and right upper quadrant abdominal pain. History of cholecystostomy tube removal on 12/06/2020 EXAM: CT ABDOMEN AND PELVIS WITHOUT CONTRAST TECHNIQUE: Multidetector CT imaging of the abdomen and pelvis was performed following the standard protocol without IV contrast. COMPARISON:  CT 09/23/2020, ultrasound 01/15/2021 FINDINGS: Lower chest:  Bibasilar dependent atelectasis. Heart size is normal. No pericardial effusion. Hepatobiliary: Gallbladder is moderately distended with multiple hyperdense gallstones. Gallbladder wall appears diffusely thickened with pericholecystic inflammatory fat stranding and small volume of adjacent fluid. Unenhanced liver is within normal limits. Small volume perihepatic ascites. Pancreas: Unremarkable. No pancreatic ductal dilatation or surrounding inflammatory changes. Spleen: Unremarkable. Adrenals/Urinary Tract: Slightly nodular configuration of the left adrenal gland is unchanged. Unremarkable right adrenal gland. Bilateral kidneys are within normal limits. No renal stone or hydronephrosis. Urinary bladder is unremarkable. Stomach/Bowel: Postsurgical changes again noted at the stomach. No evidence of bowel wall thickening, distention, or inflammatory changes. Vascular/Lymphatic: Scattered aortoiliac atherosclerotic calcifications without aneurysm. No abdominopelvic lymphadenopathy. Reproductive: Uterus and bilateral adnexa are unremarkable. Other: No organized abdominopelvic fluid collection. No pneumoperitoneum. No abdominal wall hernia. Musculoskeletal: No new or acute bony findings. IMPRESSION: 1. Appearance of the gallbladder remains suspicious for acute cholecystitis. 2. Small volume perihepatic ascites. Aortic Atherosclerosis (ICD10-I70.0). Electronically Signed   By: Davina Poke D.O.   On: 01/15/2021 15:10   DG Chest 2 View  Result Date: 01/15/2021 CLINICAL DATA:  Pt arrived via [facility] with c/o upper abd pain, started a juice cleanse last Wednesday, states it was apple juice, Pt c/o N/V and abd pain. 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 Cardiac silhouette is normal in size. No mediastinal or hilar masses or evidence  of adenopathy.  Dependent opacity at both lung bases consistent with atelectasis. Remainder of the lungs is clear. No convincing pleural effusion and no pneumothorax. Skeletal structures are intact. IMPRESSION: No acute cardiopulmonary disease. Electronically Signed   By: Lajean Manes M.D.   On: 01/15/2021 13:37   DG Shoulder Right  Result Date: 01/15/2021 CLINICAL DATA:  Right shoulder pain EXAM: RIGHT SHOULDER - 2+ VIEW COMPARISON:  None. FINDINGS: No fracture or malalignment. Moderate AC joint degenerative change with mild glenohumeral degenerative change. Small amount of calcific tendinopathy. IMPRESSION: 1. Degenerative changes without acute osseous abnormality. 2. Calcific tendinopathy Electronically Signed   By: Donavan Foil M.D.   On: 01/15/2021 18:08   US Venous Img Lower Bilateral (DVT)  Result Date: 01/20/2021 CLINICAL DATA:  Positive D-dimer.  History of DVT EXAM: BILATERAL LOWER EXTREMITY VENOUS DOPPLER ULTRASOUND TECHNIQUE: Gray-scale sonography with compression, as well as color and duplex ultrasound, were performed to evaluate the deep venous system(s) from the level of the common femoral vein through the popliteal and proximal calf veins. COMPARISON:  05/29/2016 right lower extremity Doppler ultrasound. FINDINGS: VENOUS Right: Normal compressibility of the common femoral, superficial femoral, and popliteal veins, as well as the visualized calf veins. Visualized portions of profunda femoral vein and great saphenous vein unremarkable. No filling defects to suggest DVT on grayscale or color Doppler imaging. Doppler waveforms show normal direction of venous flow, normal respiratory plasticity and response to augmentation. Left: Peripheral mid level echoes with incomplete compressibility at the left popliteal vein. The other left lower extremity deep veins showed normal color filling and compressibility. Present respiratory phasicity. IMPRESSION: 1. Nonocclusive clot in the left popliteal vein that is likely  nonacute. 2. Negative for right lower extremity DVT. Electronically Signed   By: Jorje Guild M.D.   On: 01/20/2021 06:28   IR Perc Cholecystostomy  Result Date: 01/16/2021 CLINICAL DATA:  Recurrent acute cholecystitis and status post prior percutaneous cholecystostomy tube placement on 09/26/2020. The tube was removed on 12/06/2020 after the patient insisted it be removed. She has developed recurrent acute cholecystitis and sepsis and is not a candidate currently for cholecystectomy. EXAM: PERCUTANEOUS CHOLECYSTOSTOMY COMPARISON:  CT of the abdomen and pelvis on 01/15/2021 ANESTHESIA/SEDATION: Moderate (conscious) sedation was employed during this procedure. A total of Versed 1.0 mg and Fentanyl 100 mcg was administered intravenously. Moderate Sedation Time: 25 minutes. The patient's level of consciousness and vital signs were monitored continuously by radiology nursing throughout the procedure under my direct supervision. CONTRAST:  76mL OMNIPAQUE IOHEXOL 350 MG/ML SOLN MEDICATIONS: No additional medications were administered. FLUOROSCOPY TIME:  30 seconds.  8.1 mGy. PROCEDURE: The procedure, risks, benefits, and alternatives were explained to the patient. Questions regarding the procedure were encouraged and answered. The patient understands and consents to the procedure. A time-out was performed prior to initiating the procedure. The right abdominal wall was prepped with chlorhexidine in a sterile fashion, and a sterile drape was applied covering the operative field. A sterile gown and sterile gloves were used for the procedure. Local anesthesia was provided with 1% Lidocaine. Ultrasound image documentation was performed. Fluoroscopy during the procedure and fluoro spot radiograph confirms appropriate catheter position. Ultrasound was utilized to localize the gallbladder. Under direct ultrasound guidance, an 18 gauge trocar needle was advanced into the gallbladder lumen. Aspiration was performed and a bile  sample sent for culture studies. A small amount of diluted contrast material was injected. A guide wire was then advanced into the gallbladder. Percutaneous tract dilatation was then performed  over a guide wire to 10-French. A 10-French pigtail drainage catheter was then advanced into the gallbladder lumen under fluoroscopy. Catheter was formed and injected with contrast material to confirm position. The catheter was flushed and connected to a gravity drainage bag. It was secured at the skin with a Prolene retention suture and Stat-Lock device. COMPLICATIONS: None FINDINGS: After needle puncture of the gallbladder, a bile sample was aspirated and sent for culture. Bile return was dark in color with debris. The cholecystostomy tube was advanced into the gallbladder lumen and formed. It is now draining bile. This tube will be left to gravity drainage. IMPRESSION: Percutaneous cholecystostomy with placement of 10-French drainage catheter into the gallbladder lumen. This was left to gravity drainage. Electronically Signed   By: Aletta Edouard M.D.   On: 01/16/2021 16:17   ECHOCARDIOGRAM COMPLETE  Result Date: 01/20/2021    ECHOCARDIOGRAM REPORT   Patient Name:   LISSIE HINESLEY Date of Exam: 01/20/2021 Medical Rec #:  [id number]     Height:       63.0 in Accession #:    [id number]    Weight:       195.1 lb Date of Birth:  02-24-1941    BSA:          1.913 m Patient Age:    16 years      BP:           96/56 mmHg Patient Gender: F             HR:           144 bpm. Exam Location:  [facility] Procedure: 2D Echo, Cardiac Doppler and Color Doppler Indications:     Atrial Flutter I48.92  History:         Patient has prior history of Echocardiogram examinations, most                  recent 09/26/2020. CHF; Risk Factors:Diabetes. DVT.                   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  Sonographer:     Sherrie Sport Referring Phys:  [id number] BRENDA MORRISON Diagnosing Phys:  Serafina Royals MD IMPRESSIONS  1. Left ventricular ejection fraction, by estimation, is 65 to 70%. The left ventricle has normal function. The left ventricle has no regional wall motion abnormalities. Left ventricular diastolic parameters were normal.  2. Right ventricular systolic function is normal. The right ventricular size is normal.  3. Left atrial size was moderately dilated.  4. The mitral valve has been repaired/replaced. Trivial mitral valve regurgitation. 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 Echo findings are consistent with normal structure and function of the mitral valve prosthesis.  5. The aortic valve is normal in structure. Aortic valve regurgitation is trivial. FINDINGS  Left Ventricle: Left ventricular ejection fraction, by estimation, is 65 to 70%. The left ventricle has normal function. The left ventricle has no regional wall motion abnormalities. The left ventricular internal cavity size was small. There is no left ventricular hypertrophy. Left ventricular diastolic parameters were normal. Right Ventricle: The right ventricular size is normal. No increase in right ventricular wall thickness. Right ventricular systolic function is normal. Left Atrium: Left atrial size was moderately dilated. Right Atrium: Right atrial size was normal in size. Pericardium: There is no evidence of  pericardial effusion. Mitral Valve: The mitral valve has been repaired/replaced. Trivial mitral valve regurgitation. 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 Echo findings are consistent with normal structure and function of the mitral valve prosthesis. MV peak gradient, 13.0 mmHg. The mean mitral valve gradient is 6.0 mmHg. Tricuspid Valve: The tricuspid valve is normal in structure. Tricuspid valve regurgitation is trivial. Aortic Valve: The aortic valve is normal in structure. Aortic valve regurgitation is trivial. Aortic valve mean gradient measures 17.3 mmHg. Aortic  valve peak gradient measures 30.8 mmHg. Aortic valve area, by VTI measures 1.48 cm. Pulmonic Valve: The pulmonic valve was normal in structure. Pulmonic valve regurgitation is not visualized. Aorta: The aortic root and ascending aorta are structurally normal, with no evidence of dilitation. IAS/Shunts: No atrial level shunt detected by color flow Doppler.  LEFT VENTRICLE PLAX 2D LVIDd:         3.50 cm LVIDs:         2.50 cm LV PW:         1.10 cm LV IVS:        1.40 cm LVOT diam:     2.00 cm LV SV:         71 LV SV Index:   37 LVOT Area:     3.14 cm  RIGHT VENTRICLE RV Basal diam:  3.50 cm RV S prime:     14.80 cm/s TAPSE (M-mode): 3.5 cm LEFT ATRIUM             Index       RIGHT ATRIUM           Index LA diam:        2.80 cm 1.46 cm/m  RA Area:     15.60 cm LA Vol (A2C):   90.8 ml 47.45 ml/m RA Volume:   42.60 ml  22.26 ml/m LA Vol (A4C):   78.1 ml 40.82 ml/m LA Biplane Vol: 84.1 ml 43.95 ml/m  AORTIC VALVE                    PULMONIC VALVE AV Area (Vmax):    1.78 cm     PV Vmax:        0.30 m/s AV Area (Vmean):   1.99 cm     PV Peak grad:   0.4 mmHg AV Area (VTI):     1.48 cm     RVOT Peak grad: 3 mmHg AV Vmax:           277.67 cm/s AV Vmean:          191.333 cm/s AV VTI:            0.478 m AV Peak Grad:      30.8 mmHg AV Mean Grad:      17.3 mmHg LVOT Vmax:         157.00 cm/s LVOT Vmean:        121.000 cm/s LVOT VTI:          0.225 m LVOT/AV VTI ratio: 0.47  AORTA Ao Root diam: 2.00 cm MITRAL VALVE                TRICUSPID VALVE MV Area (PHT): 4.74 cm     TR Peak grad:   39.4 mmHg MV Area VTI:   1.76 cm     TR Vmax:        314.00 cm/s MV Peak grad:  13.0 mmHg MV Mean grad:  6.0 mmHg  SHUNTS MV Vmax:       1.80 m/s     Systemic VTI:  0.22 m MV Vmean:      110.7 cm/s   Systemic Diam: 2.00 cm MV Decel Time: 160 msec MV E velocity: 180.00 cm/s MV A velocity: 88.60 cm/s MV E/A ratio:  2.03 Serafina Royals MD Electronically signed by Serafina Royals MD Signature Date/Time: 01/20/2021/12:39:30 PM    Final     US ABDOMEN LIMITED RUQ (LIVER/GB)  Result Date: 01/15/2021 CLINICAL DATA:  Right upper quadrant pain since this morning. History of a gallbladder drainage in June 2022. Drain removed in August 2022. EXAM: ULTRASOUND ABDOMEN LIMITED RIGHT UPPER QUADRANT COMPARISON:  Ultrasound, 09/26/2020. FINDINGS: Gallbladder: Gallbladder is distended. There are dependent stones and sludge. Wall thickened to between 4 and 8 mm. Small amount of pericholecystic fluid. Common bile duct: Diameter: 6 mm Liver: No focal lesion identified. Within normal limits in parenchymal echogenicity. Portal vein is patent on color Doppler imaging with normal direction of blood flow towards the liver. Other: Trace perihepatic fluid. IMPRESSION: 1. Distended gallbladder with wall thickening and dependent stones and sludge as well as a small amount of pericholecystic fluid. Findings are similar to the prior ultrasound and support acute cholecystitis in the proper clinical setting. Electronically Signed   By: Lajean Manes M.D.   On: 01/15/2021 14:13     Assessment and Recommendation  80 y.o. 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  Continuation of management of atrial flutter with combination of oral Cardizem as well as beta-blocker combination with slight increase in dosage today     2.  Further treatment and supportive care current illness and infection 3.  Begin cardiac rehabilitation 4.  Continue anticoagulation for further risk reduction stroke with atrial flutter without change today with Xarelto 5.  No further cardiac diagnostics necessary at this time 6.  Further adjustments of  medication management after above  Signed, Serafina Royals M.D. FACC

## 2021-01-24 NOTE — Assessment & Plan Note (Signed)
Due to sepsis. Slight worsening today. monitor

## 2021-01-24 NOTE — Assessment & Plan Note (Signed)
S/p IR guided cholecystostomy tube on 9/26.  Tube will need to stay in place for 6 to 8 weeks per surgery.  on oral Augmentin.

## 2021-01-24 NOTE — Progress Notes (Signed)
Occupational Therapy Treatment Patient Details Name: Gina Chambers MRN: [id number] DOB: 09/18/1940 Today's Date: 01/24/2021   History of present illness 80 y.o. female with medical history significant of hypertension, diabetes mellitus, COPD, asthma, GERD, hypothyroidism, OSA on CPAP, DVT on Xarelto, dCHF, vertigo, GI bleeding, COVID-19 infection, cholecystitis, who presents with abdominal pain. S/p cholecystostomy tube on 9/26.   OT comments  Pt seen for OT treatment on this date. Upon arrival to room, pt awake and seated upright in bed (HR 83). Pt agreeable to OT tx. With HOB elevated, pt performed supine>sit transfer with MIN A + increased time/effort. Pt tearful sitting EOB regarding medical course; extensive therapeutic listening provided by this author. Pt verbalized goal of participating in standing grooming tasks this date, however pt reporting dizziness and LE weakness in standing position, with HR increase to 126 (MAX). Further mobility deferred in setting of dizziness and LE weakness. With SUPERVISION/SET-UP assist, pt able to wash/dry face with washcloth seated EOB. Pt also engaged in seated therapy exercises consisting of AROM of scapular retraction, shoulder flexion, elbow flex/ext, and long arc quad. With MOD A, pt returned to supine and left in no acute distress (HR 99, BP 127/66) with lunch. Pt continues to benefit from skilled OT services to maximize return to PLOF and minimize risk of future falls, injury, caregiver burden, and readmission. Will continue to follow POC. Discharge recommendation remains appropriate.     Recommendations for follow up therapy are one component of a multi-disciplinary discharge planning process, led by the attending physician.  Recommendations may be updated based on patient status, additional functional criteria and insurance authorization.    Follow Up Recommendations  SNF    Equipment Recommendations  3 in 1 bedside commode;Tub/shower seat        Precautions / Restrictions Precautions Precautions: Fall Precaution Comments: monitor HR Restrictions Weight Bearing Restrictions: No       Mobility Bed Mobility Overal bed mobility: Needs Assistance Bed Mobility: Supine to Sit;Sit to Supine     Supine to sit: Min assist;HOB elevated Sit to supine: Mod assist   General bed mobility comments: With increased time/effort and use of bedrails, pt required MIN A for trunk support upon sitting upright. MOD A for LE support during sit>supine.    Transfers Overall transfer level: Needs assistance Equipment used: Rolling walker (2 wheeled) Transfers: Sit to/from Stand Sit to Stand: Min guard              Balance Overall balance assessment: Needs assistance Sitting-balance support: No upper extremity supported;Feet supported Sitting balance-Leahy Scale: Fair Sitting balance - Comments: Fair static sitting balance at EOB during seated grooming tasks   Standing balance support: Bilateral upper extremity supported;During functional activity Standing balance-Leahy Scale: Fair Standing balance comment: Pt reliant on UE support on RW for static standing                           ADL either performed or assessed with clinical judgement   ADL Overall ADL's : Needs assistance/impaired Eating/Feeding: Minimal assistance Eating/Feeding Details (indicate cue type and reason): MIN A to open containers in setting of fatigue at end of session Grooming: Wash/dry face;Supervision/safety;Set up;Sitting Grooming Details (indicate cue type and reason): Pt attempted to complete standing grooming tasks at sinkside, however c/o of dizziness upon reaching sink and requesting to sit. Following set-up assist, pt able to complete seated EOB  Cognition Arousal/Alertness: Awake/alert Behavior During Therapy: WFL for tasks assessed/performed Overall Cognitive Status: Within Functional  Limits for tasks assessed                                 General Comments: Pt agreeable to session. Pt tearful sitting EOB regarding medical course; extensive therapeutic listening provided by this author.        Exercises Other Exercises Other Exercises: Seated therapy exercises consisting of AROM of scapular retraction, shoulder flexion, elbow flex/ext, and long arc quad      General Comments Patient reports feeling dizziness and LE weakness in standing position, with HR increase to 126 (MAX). Further mobility deferred in setting of dizziness and LE weakness. Seated BP 139/55. At end of session, HR 99 (HR 83 at start of session) and BP 127/66    Pertinent Vitals/ Pain       Pain Assessment: Faces Faces Pain Scale: Hurts little more Pain Location: abdomen Pain Descriptors / Indicators: Aching Pain Intervention(s): Limited activity within patient's tolerance;Monitored during session;Repositioned         Frequency  Min 2X/week        Progress Toward Goals  OT Goals(current goals can now be found in the care plan section)  Progress towards OT goals: Progressing toward goals  Acute Rehab OT Goals Patient Stated Goal: to regain independence OT Goal Formulation: With patient Time For Goal Achievement: 02/05/21 Potential to Achieve Goals: [place] Discharge plan remains appropriate;Frequency remains appropriate       AM-PAC OT "6 Clicks" Daily Activity     Outcome Measure   Help from another person eating meals?: A Little Help from another person taking care of personal grooming?: A Little Help from another person toileting, which includes using toliet, bedpan, or urinal?: A Lot Help from another person bathing (including washing, rinsing, drying)?: A Lot Help from another person to put on and taking off regular upper body clothing?: A Little Help from another person to put on and taking off regular lower body clothing?: A Lot 6 Click Score: 15    End  of Session Equipment Utilized During Treatment: Rolling walker  OT Visit Diagnosis: Unsteadiness on feet (R26.81);Muscle weakness (generalized) (M62.81);Pain Pain - Right/Left: Left Pain - part of body:  (abdomen)   Activity Tolerance Patient tolerated treatment well   Patient Left in bed;with call bell/phone within reach;with bed alarm set   Nurse Communication Mobility status        Time: 2353-6144 OT Time Calculation (min): 41 min  Charges: OT General Charges $OT Visit: 1 Visit OT Treatments $Self Care/Home Management : 38-52 mins  Fredirick Maudlin, OTR/L [place]

## 2021-01-24 NOTE — Progress Notes (Signed)
  Progress Note    Gina Chambers   QZE:[id number]  DOB: 10-28-1940  DOA: 01/15/2021     9 Date of Service: 01/24/2021   80 y.o.femalewith medical history significant ofhypertension, diabetes mellitus, COPD, asthma, GERD, hypothyroidism, OSA on CPAP, DVT on Xarelto,dCHF, vertigo, GI bleeding, COVID-19 infection, cholecystitis admitted for worsening abdominal pain.She was diagnosed with acute cholecystitis with cholelithiasis. Cholecystostomy tube was placed on 9/26, was on IV Abx and switched to PO Augmentin on 10/3  Patient developed atrial flutter with rapid ventricular response 9/30, seen by cardiology, given diltiazem, Toprol and digoxin. Her rate controlling remains a challenge and we've been slowly increasing rate controlling meds dose.  Waiting on SNF placement  Subjective:  Improvement noted in pain and sob. she is still feeling very weak  Hospital Problems * Acute cholecystitis S/p IR guided cholecystostomy tube on 9/26.  Tube will need to stay in place for 6 to 8 weeks per surgery.  on oral Augmentin.  Paroxysmal atrial flutter (HCC) Patient's heart rate at rest in 110s to 130s.  Cardiology following.  continue Cardizem CD 180 mg twice a day.  Continue metoprolol (increased to 50 mg today) and Xarelto  Acquired hypothyroidism Continue Synthroid  Generalized weakness Multifactorial.  PT and OT recommend SNF now.  TOC aware and working on placement  Leukocytosis Due to sepsis. Slight worsening today. monitor     Objective Vital signs were reviewed and unremarkable except for: Pulse: tachycardic   Exam Physical Exam   General exam:No acute distress Respiratory system: Clear to auscultation. Respiratory effort normal. Cardiovascular system: Irregularly irregular heart sounds, tachycardic Gastrointestinal system:Obese, nondistended, cholecystostomy tube in place Central nervous system:Alert and oriented. No focal neurological deficits. Extremities:  Symmetric 5 x 5 power. Skin: No rashes, lesions or ulcers Psychiatry:Judgement and insight appear normal. Mood &affect appropriate.   Labs / Other Information My review of labs, imaging, notes and other tests is significant for leukocytosis   Daughter updated at bedside  Time spent: 35 mins [facility] 01/24/2021, 9:48 PM

## 2021-01-24 NOTE — Care Management Important Message (Signed)
Important Message  Patient Details  Name: Gina Chambers MRN: [id number] Date of Birth: 02/25/1941   Medicare Important Message Given:  Yes     Dannette Barbara 01/24/2021, 2:42 PM

## 2021-01-24 NOTE — Assessment & Plan Note (Signed)
Multifactorial.  PT and OT recommend SNF now.  TOC aware and working on placement

## 2021-01-25 DIAGNOSIS — K810 Acute cholecystitis: Secondary | ICD-10-CM | POA: Diagnosis not present

## 2021-01-25 LAB — CBC
HCT: 34.1 % — ABNORMAL LOW (ref 36.0–46.0)
Hemoglobin: 11.3 g/dL — ABNORMAL LOW (ref 12.0–15.0)
MCH: 28.5 pg (ref 26.0–34.0)
MCHC: 33.1 g/dL (ref 30.0–36.0)
MCV: 85.9 fL (ref 80.0–100.0)
Platelets: 252 10*3/uL (ref 150–400)
RBC: 3.97 MIL/uL (ref 3.87–5.11)
RDW: 15.3 % (ref 11.5–15.5)
WBC: 24.3 10*3/uL — ABNORMAL HIGH (ref 4.0–10.5)
nRBC: 0 % (ref 0.0–0.2)

## 2021-01-25 LAB — GLUCOSE, CAPILLARY
Glucose-Capillary: 160 mg/dL — ABNORMAL HIGH (ref 70–99)
Glucose-Capillary: 170 mg/dL — ABNORMAL HIGH (ref 70–99)
Glucose-Capillary: 221 mg/dL — ABNORMAL HIGH (ref 70–99)

## 2021-01-25 LAB — BASIC METABOLIC PANEL
Anion gap: 10 (ref 5–15)
BUN: 15 mg/dL (ref 8–23)
CO2: 31 mmol/L (ref 22–32)
Calcium: 8.3 mg/dL — ABNORMAL LOW (ref 8.9–10.3)
Chloride: 93 mmol/L — ABNORMAL LOW (ref 98–111)
Creatinine, Ser: 0.77 mg/dL (ref 0.44–1.00)
GFR, Estimated: >60 mL/min (ref >60)
Glucose, Bld: 158 mg/dL — ABNORMAL HIGH (ref 70–99)
Potassium: 3.6 mmol/L (ref 3.5–5.1)
Sodium: 134 mmol/L — ABNORMAL LOW (ref 135–145)

## 2021-01-25 NOTE — Progress Notes (Signed)
[facility] Encounter Note  Patient: Gina Chambers / Admit Date: 01/15/2021 / Date of Encounter: 01/25/2021, 10:32 AM   Subjective: Patient has shown mild improvement with current abdominal discomfort and infection.  Patient's heart rate control is better today with heart rates in the 80s to 90s.  Patient currently has no evidence of congestive heart failure or acute coronary syndrome.  Echocardiogram had showed normal LV systolic function with normally functioning prosthetic valves and ejection fraction of 60%.  Review of Systems: Positive for: Fatigue, weakness Negative for: Vision change, hearing change, syncope, dizziness, nausea, vomiting,diarrhea, bloody stool, stomach pain, cough, congestion, diaphoresis, urinary frequency, urinary pain,skin lesions, skin rashes Others previously listed  Objective: Telemetry: Atrial flutter with variable heart rate Physical Exam: Blood pressure (!) 129/49, pulse 90, temperature 98.5 F (36.9 C), resp. rate 17, height 5\' 3"  (1.6 m), weight 93 kg, SpO2 100 %. Body mass index is 36.32 kg/m. General: Well developed, well nourished, in no acute distress. Head: Normocephalic, atraumatic, sclera non-icteric, no xanthomas, nares are without discharge. Neck: No apparent masses Lungs: Normal respirations with no wheezes, no rhonchi, no rales , no crackles   Heart: irregular rate and rhythm, normal S1 S2, no murmur, no rub, no gallop, PMI is normal size and placement, carotid upstroke normal without bruit, jugular venous pressure normal Abdomen: Soft, non-tender, non-distended with normoactive bowel sounds. No hepatosplenomegaly. Abdominal aorta is normal size without bruit Extremities: Trace edema, no clubbing, no cyanosis, no ulcers,  Peripheral: 2+ radial, 2+ femoral, 2+ dorsal pedal pulses Neuro: Alert and oriented. Moves all extremities spontaneously. Psych:  Responds to questions appropriately with a normal  affect.   Intake/Output Summary (Last 24 hours) at 01/25/2021 1032 Last data filed at 01/25/2021 0500 Gross per 24 hour  Intake 255 ml  Output 2225 ml  Net -1970 ml    Inpatient Medications:  . amoxicillin-clavulanate  1 tablet Oral Q12H  . aspirin EC  81 mg Oral QHS  . diltiazem  180 mg Oral BID  . fluticasone  2 puff Inhalation BID  . gabapentin  100 mg Oral QID  . insulin aspart  0-5 Units Subcutaneous QHS  . insulin aspart  0-9 Units Subcutaneous TID WC  . insulin glargine-yfgn  10 Units Subcutaneous Daily   And  . insulin glargine-yfgn  5 Units Subcutaneous QHS  . levothyroxine  125 mcg Oral Q0600  . metoprolol succinate  50 mg Oral Daily  . montelukast  10 mg Oral QHS  . multivitamin with minerals   Oral Daily  . pantoprazole  20 mg Oral Daily  . rivaroxaban  20 mg Oral Q supper  . sodium chloride flush  5 mL Intracatheter Q8H  . spironolactone  25 mg Oral Daily  . torsemide  40 mg Oral BID   Infusions:  . sodium chloride      Labs: Recent Labs    01/24/21 0452 01/25/21 0622  NA 137 134*  K 3.1* 3.6  CL 96* 93*  CO2 33* 31  GLUCOSE 136* 158*  BUN 16 15  CREATININE 0.77 0.77  CALCIUM 8.2* 8.3*   No results for input(s): AST, ALT, ALKPHOS, BILITOT, PROT, ALBUMIN in the last 72 hours. Recent Labs    01/24/21 0452 01/25/21 0622  WBC 22.6* 24.3*  HGB 11.1* 11.3*  HCT 33.9* 34.1*  MCV 88.7 85.9  PLT 213 252   No results for input(s): CKTOTAL, CKMB, TROPONINI in the last 72 hours. Invalid input(s): POCBNP No results for input(s):  HGBA1C in the last 72 hours.   Weights: Filed Weights   01/21/21 0500 01/23/21 0740 01/25/21 0500  Weight: 91.7 kg 91.6 kg 93 kg     Radiology/Studies:  CT ABDOMEN PELVIS WO CONTRAST  Result Date: 01/19/2021 CLINICAL DATA:  Abdominal distention. Cholecystitis status post percutaneous cholecystostomy tube placement. Continued leukocytosis, pain. EXAM: CT ABDOMEN AND PELVIS WITHOUT CONTRAST TECHNIQUE: Multidetector CT  imaging of the abdomen and pelvis was performed following the standard protocol without IV contrast. COMPARISON:  01/15/2021 FINDINGS: Lower chest: New moderate right pleural effusion with compressive atelectasis/infiltrate in the right lower lobe. Hepatobiliary: Post percutaneous cholecystostomy tube placement. The gallbladder is decompressed. No biliary ductal dilatation. Nodular contours of the liver compatible with cirrhosis. Pancreas: No focal abnormality or ductal dilatation. Spleen: No focal abnormality.  Normal size. Adrenals/Urinary Tract: Perinephric stranding bilaterally, increasing since prior study. No hydronephrosis. Adrenal glands and urinary bladder grossly unremarkable. Stomach/Bowel: Postoperative changes in the stomach. No bowel obstruction or acute finding. Vascular/Lymphatic: Aortic atherosclerosis. No evidence of aneurysm or adenopathy. Reproductive: Uterus and adnexa unremarkable.  No mass. Other: Trace free fluid in the cul-de-sac. Increasing perihepatic ascites. Musculoskeletal: No acute bony abnormality. IMPRESSION: Interval percutaneous cholecystostomy tube placement with decompression of the gallbladder. Changes of cirrhosis.  Increasing perihepatic ascites. Moderate right pleural effusion with right lower lobe atelectasis or infiltrate. Electronically Signed   By: Rolm Baptise M.D.   On: 01/19/2021 10:30   CT ABDOMEN PELVIS WO CONTRAST  Result Date: 01/15/2021 CLINICAL DATA:  Abdominal distension and right upper quadrant abdominal pain. History of cholecystostomy tube removal on 12/06/2020 EXAM: CT ABDOMEN AND PELVIS WITHOUT CONTRAST TECHNIQUE: Multidetector CT imaging of the abdomen and pelvis was performed following the standard protocol without IV contrast. COMPARISON:  CT 09/23/2020, ultrasound 01/15/2021 FINDINGS: Lower chest: Bibasilar dependent atelectasis. Heart size is normal. No pericardial effusion. Hepatobiliary: Gallbladder is moderately distended with multiple  hyperdense gallstones. Gallbladder wall appears diffusely thickened with pericholecystic inflammatory fat stranding and small volume of adjacent fluid. Unenhanced liver is within normal limits. Small volume perihepatic ascites. Pancreas: Unremarkable. No pancreatic ductal dilatation or surrounding inflammatory changes. Spleen: Unremarkable. Adrenals/Urinary Tract: Slightly nodular configuration of the left adrenal gland is unchanged. Unremarkable right adrenal gland. Bilateral kidneys are within normal limits. No renal stone or hydronephrosis. Urinary bladder is unremarkable. Stomach/Bowel: Postsurgical changes again noted at the stomach. No evidence of bowel wall thickening, distention, or inflammatory changes. Vascular/Lymphatic: Scattered aortoiliac atherosclerotic calcifications without aneurysm. No abdominopelvic lymphadenopathy. Reproductive: Uterus and bilateral adnexa are unremarkable. Other: No organized abdominopelvic fluid collection. No pneumoperitoneum. No abdominal wall hernia. Musculoskeletal: No new or acute bony findings. IMPRESSION: 1. Appearance of the gallbladder remains suspicious for acute cholecystitis. 2. Small volume perihepatic ascites. Aortic Atherosclerosis (ICD10-I70.0). Electronically Signed   By: Davina Poke D.O.   On: 01/15/2021 15:10   DG Chest 2 View  Result Date: 01/15/2021 CLINICAL DATA:  Pt arrived via [facility] with c/o upper abd pain, started a juice cleanse last Wednesday, states it was apple juice, Pt c/o N/V and abd pain. 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 Cardiac silhouette is normal in size. No mediastinal or hilar masses or evidence of adenopathy. Dependent opacity at both lung bases consistent with atelectasis. Remainder of the lungs is clear. No convincing pleural effusion and no  pneumothorax. Skeletal structures are intact. IMPRESSION: No acute cardiopulmonary disease. Electronically  Signed   By: Lajean Manes M.D.   On: 01/15/2021 13:37   DG Shoulder Right  Result Date: 01/15/2021 CLINICAL DATA:  Right shoulder pain EXAM: RIGHT SHOULDER - 2+ VIEW COMPARISON:  None. FINDINGS: No fracture or malalignment. Moderate AC joint degenerative change with mild glenohumeral degenerative change. Small amount of calcific tendinopathy. IMPRESSION: 1. Degenerative changes without acute osseous abnormality. 2. Calcific tendinopathy Electronically Signed   By: Donavan Foil M.D.   On: 01/15/2021 18:08   US Venous Img Lower Bilateral (DVT)  Result Date: 01/20/2021 CLINICAL DATA:  Positive D-dimer.  History of DVT EXAM: BILATERAL LOWER EXTREMITY VENOUS DOPPLER ULTRASOUND TECHNIQUE: Gray-scale sonography with compression, as well as color and duplex ultrasound, were performed to evaluate the deep venous system(s) from the level of the common femoral vein through the popliteal and proximal calf veins. COMPARISON:  05/29/2016 right lower extremity Doppler ultrasound. FINDINGS: VENOUS Right: Normal compressibility of the common femoral, superficial femoral, and popliteal veins, as well as the visualized calf veins. Visualized portions of profunda femoral vein and great saphenous vein unremarkable. No filling defects to suggest DVT on grayscale or color Doppler imaging. Doppler waveforms show normal direction of venous flow, normal respiratory plasticity and response to augmentation. Left: Peripheral mid level echoes with incomplete compressibility at the left popliteal vein. The other left lower extremity deep veins showed normal color filling and compressibility. Present respiratory phasicity. IMPRESSION: 1. Nonocclusive clot in the left popliteal vein that is likely nonacute. 2. Negative for right lower extremity DVT. Electronically Signed   By: Jorje Guild M.D.   On: 01/20/2021 06:28   IR Perc  Cholecystostomy  Result Date: 01/16/2021 CLINICAL DATA:  Recurrent acute cholecystitis and status post prior percutaneous cholecystostomy tube placement on 09/26/2020. The tube was removed on 12/06/2020 after the patient insisted it be removed. She has developed recurrent acute cholecystitis and sepsis and is not a candidate currently for cholecystectomy. EXAM: PERCUTANEOUS CHOLECYSTOSTOMY COMPARISON:  CT of the abdomen and pelvis on 01/15/2021 ANESTHESIA/SEDATION: Moderate (conscious) sedation was employed during this procedure. A total of Versed 1.0 mg and Fentanyl 100 mcg was administered intravenously. Moderate Sedation Time: 25 minutes. The patient's level of consciousness and vital signs were monitored continuously by radiology nursing throughout the procedure under my direct supervision. CONTRAST:  71mL OMNIPAQUE IOHEXOL 350 MG/ML SOLN MEDICATIONS: No additional medications were administered. FLUOROSCOPY TIME:  30 seconds.  8.1 mGy. PROCEDURE: The procedure, risks, benefits, and alternatives were explained to the patient. Questions regarding the procedure were encouraged and answered. The patient understands and consents to the procedure. A time-out was performed prior to initiating the procedure. The right abdominal wall was prepped with chlorhexidine in a sterile fashion, and a sterile drape was applied covering the operative field. A sterile gown and sterile gloves were used for the procedure. Local anesthesia was provided with 1% Lidocaine. Ultrasound image documentation was performed. Fluoroscopy during the procedure and fluoro spot radiograph confirms appropriate catheter position. Ultrasound was utilized to localize the gallbladder. Under direct ultrasound guidance, an 18 gauge trocar needle was advanced into the gallbladder lumen. Aspiration was performed and a bile sample sent for culture studies. A small amount of diluted contrast material was injected. A guide wire was then advanced into the  gallbladder. Percutaneous tract dilatation was then performed over a guide wire to 10-French. A 10-French pigtail drainage catheter was then advanced into the gallbladder lumen under fluoroscopy. Catheter was formed and injected with contrast material to confirm position. The catheter was flushed  and connected to a gravity drainage bag. It was secured at the skin with a Prolene retention suture and Stat-Lock device. COMPLICATIONS: None FINDINGS: After needle puncture of the gallbladder, a bile sample was aspirated and sent for culture. Bile return was dark in color with debris. The cholecystostomy tube was advanced into the gallbladder lumen and formed. It is now draining bile. This tube will be left to gravity drainage. IMPRESSION: Percutaneous cholecystostomy with placement of 10-French drainage catheter into the gallbladder lumen. This was left to gravity drainage. Electronically Signed   By: Aletta Edouard M.D.   On: 01/16/2021 16:17   ECHOCARDIOGRAM COMPLETE  Result Date: 01/20/2021    ECHOCARDIOGRAM REPORT   Patient Name:   Gina Chambers Date of Exam: 01/20/2021 Medical Rec #:  [id number]     Height:       63.0 in Accession #:    [id number]    Weight:       195.1 lb Date of Birth:  November 10, 1940    BSA:          1.913 m Patient Age:    17 years      BP:           96/56 mmHg Patient Gender: F             HR:           144 bpm. Exam Location:  [facility] Procedure: 2D Echo, Cardiac Doppler and Color Doppler Indications:     Atrial Flutter I48.92  History:         Patient has prior history of Echocardiogram examinations, most                  recent 09/26/2020. CHF; Risk Factors:Diabetes. DVT.                   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  Sonographer:     Sherrie Sport Referring Phys:  [id number] BRENDA MORRISON Diagnosing Phys: Serafina Royals MD IMPRESSIONS  1. Left ventricular ejection fraction, by estimation, is 65 to 70%. The left ventricle has normal  function. The left ventricle has no regional wall motion abnormalities. Left ventricular diastolic parameters were normal.  2. Right ventricular systolic function is normal. The right ventricular size is normal.  3. Left atrial size was moderately dilated.  4. The mitral valve has been repaired/replaced. Trivial mitral valve regurgitation. 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 Echo findings are consistent with normal structure and function of the mitral valve prosthesis.  5. The aortic valve is normal in structure. Aortic valve regurgitation is trivial. FINDINGS  Left Ventricle: Left ventricular ejection fraction, by estimation, is 65 to 70%. The left ventricle has normal function. The left ventricle has no regional wall motion abnormalities. The left ventricular internal cavity size was small. There is no left ventricular hypertrophy. Left ventricular diastolic parameters were normal. Right Ventricle: The right ventricular size is normal. No increase in right ventricular wall thickness. Right ventricular systolic function is normal. Left Atrium: Left atrial size was moderately dilated. Right Atrium: Right atrial size was normal in size. Pericardium: There is no evidence of pericardial effusion. Mitral Valve: The mitral valve has been repaired/replaced. Trivial mitral valve regurgitation. 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 Echo findings are consistent with normal structure and function of the  mitral valve prosthesis. MV peak gradient, 13.0 mmHg. The mean mitral valve gradient is 6.0 mmHg. Tricuspid Valve: The tricuspid valve is normal in structure. Tricuspid valve regurgitation is trivial. Aortic Valve: The aortic valve is normal in structure. Aortic valve regurgitation is trivial. Aortic valve mean gradient measures 17.3 mmHg. Aortic valve peak gradient measures 30.8 mmHg. Aortic valve area, by VTI measures 1.48 cm. Pulmonic Valve: The pulmonic valve was normal  in structure. Pulmonic valve regurgitation is not visualized. Aorta: The aortic root and ascending aorta are structurally normal, with no evidence of dilitation. IAS/Shunts: No atrial level shunt detected by color flow Doppler.  LEFT VENTRICLE PLAX 2D LVIDd:         3.50 cm LVIDs:         2.50 cm LV PW:         1.10 cm LV IVS:        1.40 cm LVOT diam:     2.00 cm LV SV:         71 LV SV Index:   37 LVOT Area:     3.14 cm  RIGHT VENTRICLE RV Basal diam:  3.50 cm RV S prime:     14.80 cm/s TAPSE (M-mode): 3.5 cm LEFT ATRIUM             Index       RIGHT ATRIUM           Index LA diam:        2.80 cm 1.46 cm/m  RA Area:     15.60 cm LA Vol (A2C):   90.8 ml 47.45 ml/m RA Volume:   42.60 ml  22.26 ml/m LA Vol (A4C):   78.1 ml 40.82 ml/m LA Biplane Vol: 84.1 ml 43.95 ml/m  AORTIC VALVE                    PULMONIC VALVE AV Area (Vmax):    1.78 cm     PV Vmax:        0.30 m/s AV Area (Vmean):   1.99 cm     PV Peak grad:   0.4 mmHg AV Area (VTI):     1.48 cm     RVOT Peak grad: 3 mmHg AV Vmax:           277.67 cm/s AV Vmean:          191.333 cm/s AV VTI:            0.478 m AV Peak Grad:      30.8 mmHg AV Mean Grad:      17.3 mmHg LVOT Vmax:         157.00 cm/s LVOT Vmean:        121.000 cm/s LVOT VTI:          0.225 m LVOT/AV VTI ratio: 0.47  AORTA Ao Root diam: 2.00 cm MITRAL VALVE                TRICUSPID VALVE MV Area (PHT): 4.74 cm     TR Peak grad:   39.4 mmHg MV Area VTI:   1.76 cm     TR Vmax:        314.00 cm/s MV Peak grad:  13.0 mmHg MV Mean grad:  6.0 mmHg     SHUNTS MV Vmax:       1.80 m/s     Systemic VTI:  0.22 m MV Vmean:      110.7 cm/s   Systemic Diam:  2.00 cm MV Decel Time: 160 msec MV E velocity: 180.00 cm/s MV A velocity: 88.60 cm/s MV E/A ratio:  2.03 Serafina Royals MD Electronically signed by Serafina Royals MD Signature Date/Time: 01/20/2021/12:39:30 PM    Final    US ABDOMEN LIMITED RUQ (LIVER/GB)  Result Date: 01/15/2021 CLINICAL DATA:  Right upper quadrant pain since this morning.  History of a gallbladder drainage in June 2022. Drain removed in August 2022. EXAM: ULTRASOUND ABDOMEN LIMITED RIGHT UPPER QUADRANT COMPARISON:  Ultrasound, 09/26/2020. FINDINGS: Gallbladder: Gallbladder is distended. There are dependent stones and sludge. Wall thickened to between 4 and 8 mm. Small amount of pericholecystic fluid. Common bile duct: Diameter: 6 mm Liver: No focal lesion identified. Within normal limits in parenchymal echogenicity. Portal vein is patent on color Doppler imaging with normal direction of blood flow towards the liver. Other: Trace perihepatic fluid. IMPRESSION: 1. Distended gallbladder with wall thickening and dependent stones and sludge as well as a small amount of pericholecystic fluid. Findings are similar to the prior ultrasound and support acute cholecystitis in the proper clinical setting. Electronically Signed   By: Lajean Manes M.D.   On: 01/15/2021 14:13     Assessment and Recommendation  80 y.o. 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  Slowly improving.  Plan: -Continue management of atrial flutter with oral Cardizem and beta-blockers, currently well rate controlled -Continue supportive care and treatment for acute illness and infection -Begin cardiac rehabilitation -Continue Xarelto for further stroke risk reduction with atrial flutter -No further cardiac diagnostic testing necessary at this time  Signed, Jettie Booze, PA-C

## 2021-01-25 NOTE — TOC Progression Note (Signed)
Transition of Care [facility]) - Progression Note    Patient Details  Name: Gina Chambers MRN: [id number] Date of Birth: [birth year]  Transition of Care [facility]) CM/SW [place], RN Phone Number: 01/25/2021, 9:13 AM  Clinical Narrative:  Late entry, spoke with patient and daughter about discharge plans. On the morning of 10/3 daughter was given Private Duty NA list to call for assistance with round the clock care, understands this will not be covered by [employer]. Daughter then requested more information about hospice home care, call Sonia Baller from Hospice to speak with daughter and mother. Sonia Baller reports that she spoke with daughter in detail about Hospice at home, however daughter indecisive at this time. Following rounds I spoke with daughter, PT in the room, daughter says PT may request inpatient rehab. Due to decline in condition so she will be receptive to the recommendation. I did inform daughter I will review recommendation and proceed with plan.         Expected Discharge Plan and Services                                                 Social Determinants of Health (SDOH) Interventions    Readmission Risk Interventions No flowsheet data found.

## 2021-01-25 NOTE — Progress Notes (Signed)
PROGRESS NOTE  Gina Chambers YCX:[id number] DOB: October 08, 1940 DOA: 01/15/2021 PCP: Kirk Ruths, MD  Brief History   80 y.o. female with medical history significant of hypertension, diabetes mellitus, COPD, asthma, GERD, hypothyroidism, OSA on CPAP, DVT on Xarelto, dCHF, vertigo, GI bleeding, COVID-19 infection, cholecystitis admitted for worsening abdominal pain.  She was diagnosed with acute cholecystitis with cholelithiasis.  Cholecystostomy tube was placed on 9/26, was on IV Abx and switched to PO Augmentin on 10/3   Patient developed atrial flutter with rapid ventricular response 9/30, seen by cardiology, given diltiazem, Toprol and digoxin. Her rate controlling remains a challenge and we've been slowly increasing rate controlling meds dose.   Waiting on SNF placement  Consultants  Cardiology Palliative Care General surgery Interventional radiology  Procedures  Cholecystostomy tube placement 01/16/2021  Antibiotics   Anti-infectives (From admission, onward)    Start     Dose/Rate Route Frequency Ordered Stop   01/23/21 2200  amoxicillin-clavulanate (AUGMENTIN) 875-125 MG per tablet 1 tablet        1 tablet Oral Every 12 hours 01/23/21 1146 01/30/21 0959   01/20/21 1600  Ampicillin-Sulbactam (UNASYN) 3 g in sodium chloride 0.9 % 100 mL IVPB        3 g 200 mL/hr over 30 Minutes Intravenous Every 6 hours 01/20/21 1457 01/23/21 1706   01/15/21 2200  piperacillin-tazobactam (ZOSYN) IVPB 3.375 g  Status:  Discontinued        3.375 g 12.5 mL/hr over 240 Minutes Intravenous Every 8 hours 01/15/21 1626 01/20/21 1457   01/15/21 1545  piperacillin-tazobactam (ZOSYN) IVPB 3.375 g        3.375 g 100 mL/hr over 30 Minutes Intravenous  Once 01/15/21 1543 01/15/21 1715      Subjective  The patient is resting comfortably. No new complaints.   Objective   Vitals:  Vitals:   01/25/21 1620 01/25/21 1832  BP: (!) 118/50 (!) 114/43  Pulse: 66 62  Resp: 17 19  Temp: 97.7 F  (36.5 C) 97.8 F (36.6 C)  SpO2: 99% 98%    Exam:  Constitutional:  Awake, alert, and oriented x 3. No acute distress. Respiratory:  CTA bilaterally, no w/r/r.  Respiratory effort normal. No retractions or accessory muscle use Cardiovascular:  RRR, no m/r/g No LE extremity edema   Normal pedal pulses Abdomen:  Abdomen appears normal; no tenderness or masses No hernias No HSM Musculoskeletal:  Digits/nails BUE: no clubbing, cyanosis, petechiae, infection exam of joints, bones, muscles of at least one of following: head/neck, RUE, LUE, RLE, LLE   Skin:  No rashes, lesions, ulcers palpation of skin: no induration or nodules Neurologic:  CN 2-12 intact Sensation all 4 extremities intact Psychiatric:  Mental status: Depressed Mood, affect appropriate Orientation to person, place, time   I have personally reviewed the following:   Today's Data  Vitals  Lab Data  CBC, BMP  Imaging  CT abdomen and pelvis  Cardiology Data  EKG Echocardiogram  Scheduled Meds:  amoxicillin-clavulanate  1 tablet Oral Q12H   aspirin EC  81 mg Oral QHS   diltiazem  180 mg Oral BID   fluticasone  2 puff Inhalation BID   gabapentin  100 mg Oral QID   insulin aspart  0-5 Units Subcutaneous QHS   insulin aspart  0-9 Units Subcutaneous TID WC   insulin glargine-yfgn  10 Units Subcutaneous Daily   And   insulin glargine-yfgn  5 Units Subcutaneous QHS   levothyroxine  125 mcg Oral Q0600  metoprolol succinate  50 mg Oral Daily   montelukast  10 mg Oral QHS   multivitamin with minerals   Oral Daily   pantoprazole  20 mg Oral Daily   rivaroxaban  20 mg Oral Q supper   sodium chloride flush  5 mL Intracatheter Q8H   spironolactone  25 mg Oral Daily   torsemide  40 mg Oral BID   Continuous Infusions:  sodium chloride      Principal Problem:   Acute cholecystitis Active Problems:   Leukocytosis   Generalized weakness   Acquired hypothyroidism   Essential hypertension   Type 2  diabetes mellitus without complication (HCC)   Hyponatremia   COPD (chronic obstructive pulmonary disease) (HCC)   DVT (deep venous thrombosis) (HCC)   OSA on CPAP   Chronic diastolic CHF (congestive heart failure) (HCC)   Sepsis (HCC)   Right shoulder pain   Paroxysmal atrial flutter ([place])   LOS: 10 days   [facility]ospital Problems * Acute cholecystitis S/p IR guided cholecystostomy tube on 9/26.  Tube will need to stay in place for 6 to 8 weeks per surgery.  On oral Augmentin.   Paroxysmal atrial flutter (HCC) Patient's heart rate at rest in 110s to 130s.  Cardiology following.  continue Cardizem CD 180 mg twice a day.  Continue metoprolol (increased to 50 mg today) and Xarelto   Acquired hypothyroidism Continue Synthroid   Generalized weakness Multifactorial.  PT and OT recommend SNF now.  TOC aware and working on placement   Leukocytosis Due to sepsis. WBC continues to increase. Monitor    I have seen and examined this patient myself. I have spent 34 minutes in her evaluation and care.   Danny Zimny, DO [facility] Direct contact: see [email or web address]  7PM-7AM contact night coverage as above 01/25/2021, 7:58 PM  LOS: 10 days

## 2021-01-25 NOTE — TOC Progression Note (Signed)
Transition of Care [facility]) - Progression Note    Patient Details  Name: ADIBA FARGNOLI MRN: [id number] Date of Birth: 28-Jul-1940  Transition of Care [facility]) CM/SW [place], [place] Phone Number: 01/25/2021, 12:48 PM  Clinical Narrative:        CSW met with and spoke with patient at bedside, requested CSW Reach out to her daughter as she did not know what was going on.  CSW called patient's daughter Eugene Garnet. She reports at this time preference is for patient to go to hospice home if she qualifies.   CSW has reached out to [employer] with [facility] to inform of the above. [facility] will evaluate for hospice home.       Expected Discharge Plan and Services                                                 Social Determinants of Health (SDOH) Interventions    Readmission Risk Interventions No flowsheet data found.

## 2021-01-25 NOTE — Progress Notes (Signed)
Palliative:  PMT continues to follow. Chart review reveals family interest in speaking to hospice liaison - pending approval by [facility] physician for hospice home. I have reached out to hospice liaison to let us know if goals remain unclear or if any needs PMT can assist with arise. No needs identified at this time.   Please call with any needs for PMT.  Juel Burrow, DNP, AGNP-C Palliative Medicine Team Team Phone # [phone]  Pager # [phone]  NO CHARGE

## 2021-01-25 NOTE — Progress Notes (Signed)
[facility]) Hospital Liaison Note   Received request from Transitions of Care Manager Pricilla Riffle, LCSW for family interest in [place]. Visited patient at bedside and spoke with daughter Peggyann Shoals by phone to confirm interest and explain services. Patient chart and information under review by [facility] physician. Hospice Home eligibility pending at this time.    Unfortunately, Hospice Home is not able to offer a room today. Family and [facility] Manager aware hospital liaison will follow up tomorrow or sooner if patient is eligible and if a room becomes available.    Please do not hesitate to call with any hospice related questions.    Thank you for the opportunity to participate in this patient's care.   Bobbie "Loren Racer, RN, BSN [facility] Liaison [phone]

## 2021-01-26 DIAGNOSIS — K810 Acute cholecystitis: Secondary | ICD-10-CM | POA: Diagnosis not present

## 2021-01-26 LAB — CBC WITH DIFFERENTIAL/PLATELET
Abs Immature Granulocytes: 0.49 10*3/uL — ABNORMAL HIGH (ref 0.00–0.07)
Basophils Absolute: 0.1 10*3/uL (ref 0.0–0.1)
Basophils Relative: 1 %
Eosinophils Absolute: 0.2 10*3/uL (ref 0.0–0.5)
Eosinophils Relative: 1 %
HCT: 34.9 % — ABNORMAL LOW (ref 36.0–46.0)
Hemoglobin: 11.5 g/dL — ABNORMAL LOW (ref 12.0–15.0)
Immature Granulocytes: 3 %
Lymphocytes Relative: 33 %
Lymphs Abs: 6.4 10*3/uL — ABNORMAL HIGH (ref 0.7–4.0)
MCH: 28.7 pg (ref 26.0–34.0)
MCHC: 33 g/dL (ref 30.0–36.0)
MCV: 87 fL (ref 80.0–100.0)
Monocytes Absolute: 1.3 10*3/uL — ABNORMAL HIGH (ref 0.1–1.0)
Monocytes Relative: 7 %
Neutro Abs: 11 10*3/uL — ABNORMAL HIGH (ref 1.7–7.7)
Neutrophils Relative %: 55 %
Platelets: 252 10*3/uL (ref 150–400)
RBC: 4.01 MIL/uL (ref 3.87–5.11)
RDW: 15.2 % (ref 11.5–15.5)
WBC: 19.5 10*3/uL — ABNORMAL HIGH (ref 4.0–10.5)
nRBC: 0 % (ref 0.0–0.2)

## 2021-01-26 LAB — GLUCOSE, CAPILLARY
Glucose-Capillary: 127 mg/dL — ABNORMAL HIGH (ref 70–99)
Glucose-Capillary: 146 mg/dL — ABNORMAL HIGH (ref 70–99)
Glucose-Capillary: 188 mg/dL — ABNORMAL HIGH (ref 70–99)
Glucose-Capillary: 202 mg/dL — ABNORMAL HIGH (ref 70–99)

## 2021-01-26 NOTE — Progress Notes (Signed)
PROGRESS NOTE  Gina Chambers ESP:[id number] DOB: 19-Dec-1940 DOA: 01/15/2021 PCP: Kirk Ruths, MD  Brief History   80 y.o. female with medical history significant of hypertension, diabetes mellitus, COPD, asthma, GERD, hypothyroidism, OSA on CPAP, DVT on Xarelto, dCHF, vertigo, GI bleeding, COVID-19 infection, cholecystitis admitted for worsening abdominal pain.  She was diagnosed with acute cholecystitis with cholelithiasis.  Cholecystostomy tube was placed on 9/26, was on IV Abx and switched to PO Augmentin on 10/3   Patient developed atrial flutter with rapid ventricular response 9/30, seen by cardiology, given diltiazem, Toprol and digoxin. Heart rate is much better controlled. Cardiology has recommended cardiac rehab. No further cardiac testing is anticipated.   Waiting on SNF placement  Consultants  Cardiology Palliative Care General surgery Interventional radiology  Procedures  Cholecystostomy tube placement 01/16/2021  Antibiotics   Anti-infectives (From admission, onward)    Start     Dose/Rate Route Frequency Ordered Stop   01/23/21 2200  amoxicillin-clavulanate (AUGMENTIN) 875-125 MG per tablet 1 tablet        1 tablet Oral Every 12 hours 01/23/21 1146 01/30/21 0959   01/20/21 1600  Ampicillin-Sulbactam (UNASYN) 3 g in sodium chloride 0.9 % 100 mL IVPB        3 g 200 mL/hr over 30 Minutes Intravenous Every 6 hours 01/20/21 1457 01/23/21 1706   01/15/21 2200  piperacillin-tazobactam (ZOSYN) IVPB 3.375 g  Status:  Discontinued        3.375 g 12.5 mL/hr over 240 Minutes Intravenous Every 8 hours 01/15/21 1626 01/20/21 1457   01/15/21 1545  piperacillin-tazobactam (ZOSYN) IVPB 3.375 g        3.375 g 100 mL/hr over 30 Minutes Intravenous  Once 01/15/21 1543 01/15/21 1715      Subjective  The patient is resting comfortably. No new complaints.  Objective   Vitals:  Vitals:   01/26/21 1211 01/26/21 1557  BP: (!) 104/47 126/71  Pulse: (!) 44 84  Resp: 14 18   Temp: 98 F (36.7 C) 98 F (36.7 C)  SpO2: 99% 99%    Exam:  Constitutional:  Awake, alert, and oriented x 3. No acute distress. Respiratory:  CTA bilaterally, no w/r/r.  Respiratory effort normal. No retractions or accessory muscle use Cardiovascular:  RRR, no m/r/g No LE extremity edema   Normal pedal pulses Abdomen:  Abdomen appears normal; no tenderness or masses No hernias No HSM Musculoskeletal:  Digits/nails BUE: no clubbing, cyanosis, petechiae, infection exam of joints, bones, muscles of at least one of following: head/neck, RUE, LUE, RLE, LLE   Skin:  No rashes, lesions, ulcers palpation of skin: no induration or nodules Neurologic:  CN 2-12 intact Sensation all 4 extremities intact Psychiatric:  Mental status: Depressed Mood, affect appropriate Orientation to person, place, time   I have personally reviewed the following:   Today's Data  Vitals  Lab Data  CBC - improved WBC  Imaging  CT abdomen and pelvis  Cardiology Data  EKG Echocardiogram  Scheduled Meds:  amoxicillin-clavulanate  1 tablet Oral Q12H   aspirin EC  81 mg Oral QHS   diltiazem  180 mg Oral BID   fluticasone  2 puff Inhalation BID   gabapentin  100 mg Oral QID   insulin aspart  0-5 Units Subcutaneous QHS   insulin aspart  0-9 Units Subcutaneous TID WC   insulin glargine-yfgn  10 Units Subcutaneous Daily   And   insulin glargine-yfgn  5 Units Subcutaneous QHS   levothyroxine  125 mcg  Oral Q0600   metoprolol succinate  50 mg Oral Daily   montelukast  10 mg Oral QHS   multivitamin with minerals   Oral Daily   pantoprazole  20 mg Oral Daily   rivaroxaban  20 mg Oral Q supper   sodium chloride flush  5 mL Intracatheter Q8H   spironolactone  25 mg Oral Daily   torsemide  40 mg Oral BID   Continuous Infusions:  sodium chloride      Principal Problem:   Acute cholecystitis Active Problems:   Leukocytosis   Generalized weakness   Acquired hypothyroidism   Essential  hypertension   Type 2 diabetes mellitus without complication (HCC)   Hyponatremia   COPD (chronic obstructive pulmonary disease) (HCC)   DVT (deep venous thrombosis) (HCC)   OSA on CPAP   Chronic diastolic CHF (congestive heart failure) (HCC)   Sepsis (HCC)   Right shoulder pain   Paroxysmal atrial flutter ([place])   LOS: 11 days   [facility] Problems * Acute cholecystitis S/p IR guided cholecystostomy tube on 9/26.  Tube will need to stay in place for 6 to 8 weeks per surgery.  On oral Augmentin. WBC decreasing.   Paroxysmal atrial flutter (HCC) Patient's heart rate at rest in 110s to 130s.  Cardiology following.  continue Cardizem CD 180 mg twice a day.  Continue metoprolol (increased to 50 mg today) and Xarelto. Cardiology has recommended cardiac rehab for patient upon discharge.    Acquired hypothyroidism Continue Synthroid   Generalized weakness Multifactorial.  PT and OT recommend SNF now.  TOC aware and working on placement   Leukocytosis Decreasing.     I have seen and examined this patient myself. I have spent 32 minutes in her evaluation and care.   Aurther Harlin, DO [facility] Direct contact: see [email or web address]  7PM-7AM contact night coverage as above 01/26/2021, 6:40 PM  LOS: 10 days

## 2021-01-26 NOTE — TOC Progression Note (Signed)
Transition of Care [facility]) - Progression Note    Patient Details  Name: Gina Chambers MRN: [id number] Date of Birth: 22-Apr-1941  Transition of Care [facility]) CM/SW Contact  Eileen Stanford, LCSW Phone Number: 01/26/2021, 3:41 PM  Clinical Narrative:   Waiting on hospice home bed. No bed availability today.          Expected Discharge Plan and Services                                                 Social Determinants of Health (SDOH) Interventions    Readmission Risk Interventions No flowsheet data found.

## 2021-01-26 NOTE — Progress Notes (Signed)
[facility] Encounter Note  Patient: Gina Chambers / Admit Date: 01/15/2021 / Date of Encounter: 01/26/2021, 12:04 PM   Subjective: Patient has shown mild improvement with current abdominal discomfort and infection.  Patient's heart rate control is better today with heart rates in the 80s to 90s.  Patient currently has no evidence of congestive heart failure or acute coronary syndrome.  Echocardiogram had showed normal LV systolic function with normally functioning prosthetic valves and ejection fraction of 60%.  Patient appears better today.  Patient states that she has more energy and she was able to get more sleep last night.  She explained that she does not feel her racing heart as prominently as she had before and has been able to rest better.  Review of Systems: Positive for: Fatigue Negative for: Vision change, hearing change, syncope, dizziness, nausea, vomiting,diarrhea, bloody stool, stomach pain, cough, congestion, diaphoresis, urinary frequency, urinary pain,skin lesions, skin rashes Others previously listed  Objective: Telemetry: Atrial flutter with variable heart rate Physical Exam: Blood pressure (!) 137/91, pulse 69, temperature 97.7 F (36.5 C), resp. rate 18, height 5\' 3"  (1.6 m), weight 87.5 kg, SpO2 99 %. Body mass index is 34.17 kg/m. General: Well developed, well nourished, in no acute distress. Head: Normocephalic, atraumatic, sclera non-icteric, no xanthomas, nares are without discharge. Neck: No apparent masses Lungs: Normal respirations with no wheezes, no rhonchi, no rales , no crackles   Heart: irregular rate and rhythm, normal S1 S2, no murmur, no rub, no gallop, PMI is normal size and placement, carotid upstroke normal without bruit, jugular venous pressure normal Abdomen: Soft, non-tender, non-distended with normoactive bowel sounds. No hepatosplenomegaly. Abdominal aorta is normal size without bruit Extremities: Trace edema, no clubbing,  no cyanosis, no ulcers,  Peripheral: 2+ radial, 2+ femoral, 2+ dorsal pedal pulses Neuro: Alert and oriented. Moves all extremities spontaneously. Psych:  Responds to questions appropriately with a normal affect.   Intake/Output Summary (Last 24 hours) at 01/26/2021 1204 Last data filed at 01/26/2021 0940 Gross per 24 hour  Intake 610 ml  Output 2605 ml  Net -1995 ml    Inpatient Medications:  . amoxicillin-clavulanate  1 tablet Oral Q12H  . aspirin EC  81 mg Oral QHS  . diltiazem  180 mg Oral BID  . fluticasone  2 puff Inhalation BID  . gabapentin  100 mg Oral QID  . insulin aspart  0-5 Units Subcutaneous QHS  . insulin aspart  0-9 Units Subcutaneous TID WC  . insulin glargine-yfgn  10 Units Subcutaneous Daily   And  . insulin glargine-yfgn  5 Units Subcutaneous QHS  . levothyroxine  125 mcg Oral Q0600  . metoprolol succinate  50 mg Oral Daily  . montelukast  10 mg Oral QHS  . multivitamin with minerals   Oral Daily  . pantoprazole  20 mg Oral Daily  . rivaroxaban  20 mg Oral Q supper  . sodium chloride flush  5 mL Intracatheter Q8H  . spironolactone  25 mg Oral Daily  . torsemide  40 mg Oral BID   Infusions:  . sodium chloride      Labs: Recent Labs    01/24/21 0452 01/25/21 0622  NA 137 134*  K 3.1* 3.6  CL 96* 93*  CO2 33* 31  GLUCOSE 136* 158*  BUN 16 15  CREATININE 0.77 0.77  CALCIUM 8.2* 8.3*   No results for input(s): AST, ALT, ALKPHOS, BILITOT, PROT, ALBUMIN in the last 72 hours. Recent Labs  01/25/21 0622 01/26/21 0818  WBC 24.3* 19.5*  NEUTROABS  --  11.0*  HGB 11.3* 11.5*  HCT 34.1* 34.9*  MCV 85.9 87.0  PLT 252 252   No results for input(s): CKTOTAL, CKMB, TROPONINI in the last 72 hours. Invalid input(s): POCBNP No results for input(s): HGBA1C in the last 72 hours.   Weights: Filed Weights   01/23/21 0740 01/25/21 0500 01/26/21 0623  Weight: 91.6 kg 93 kg 87.5 kg     Radiology/Studies:  CT ABDOMEN PELVIS WO CONTRAST  Result  Date: 01/19/2021 CLINICAL DATA:  Abdominal distention. Cholecystitis status post percutaneous cholecystostomy tube placement. Continued leukocytosis, pain. EXAM: CT ABDOMEN AND PELVIS WITHOUT CONTRAST TECHNIQUE: Multidetector CT imaging of the abdomen and pelvis was performed following the standard protocol without IV contrast. COMPARISON:  01/15/2021 FINDINGS: Lower chest: New moderate right pleural effusion with compressive atelectasis/infiltrate in the right lower lobe. Hepatobiliary: Post percutaneous cholecystostomy tube placement. The gallbladder is decompressed. No biliary ductal dilatation. Nodular contours of the liver compatible with cirrhosis. Pancreas: No focal abnormality or ductal dilatation. Spleen: No focal abnormality.  Normal size. Adrenals/Urinary Tract: Perinephric stranding bilaterally, increasing since prior study. No hydronephrosis. Adrenal glands and urinary bladder grossly unremarkable. Stomach/Bowel: Postoperative changes in the stomach. No bowel obstruction or acute finding. Vascular/Lymphatic: Aortic atherosclerosis. No evidence of aneurysm or adenopathy. Reproductive: Uterus and adnexa unremarkable.  No mass. Other: Trace free fluid in the cul-de-sac. Increasing perihepatic ascites. Musculoskeletal: No acute bony abnormality. IMPRESSION: Interval percutaneous cholecystostomy tube placement with decompression of the gallbladder. Changes of cirrhosis.  Increasing perihepatic ascites. Moderate right pleural effusion with right lower lobe atelectasis or infiltrate. Electronically Signed   By: Rolm Baptise M.D.   On: 01/19/2021 10:30   CT ABDOMEN PELVIS WO CONTRAST  Result Date: 01/15/2021 CLINICAL DATA:  Abdominal distension and right upper quadrant abdominal pain. History of cholecystostomy tube removal on 12/06/2020 EXAM: CT ABDOMEN AND PELVIS WITHOUT CONTRAST TECHNIQUE: Multidetector CT imaging of the abdomen and pelvis was performed following the standard protocol without IV  contrast. COMPARISON:  CT 09/23/2020, ultrasound 01/15/2021 FINDINGS: Lower chest: Bibasilar dependent atelectasis. Heart size is normal. No pericardial effusion. Hepatobiliary: Gallbladder is moderately distended with multiple hyperdense gallstones. Gallbladder wall appears diffusely thickened with pericholecystic inflammatory fat stranding and small volume of adjacent fluid. Unenhanced liver is within normal limits. Small volume perihepatic ascites. Pancreas: Unremarkable. No pancreatic ductal dilatation or surrounding inflammatory changes. Spleen: Unremarkable. Adrenals/Urinary Tract: Slightly nodular configuration of the left adrenal gland is unchanged. Unremarkable right adrenal gland. Bilateral kidneys are within normal limits. No renal stone or hydronephrosis. Urinary bladder is unremarkable. Stomach/Bowel: Postsurgical changes again noted at the stomach. No evidence of bowel wall thickening, distention, or inflammatory changes. Vascular/Lymphatic: Scattered aortoiliac atherosclerotic calcifications without aneurysm. No abdominopelvic lymphadenopathy. Reproductive: Uterus and bilateral adnexa are unremarkable. Other: No organized abdominopelvic fluid collection. No pneumoperitoneum. No abdominal wall hernia. Musculoskeletal: No new or acute bony findings. IMPRESSION: 1. Appearance of the gallbladder remains suspicious for acute cholecystitis. 2. Small volume perihepatic ascites. Aortic Atherosclerosis (ICD10-I70.0). Electronically Signed   By: Davina Poke D.O.   On: 01/15/2021 15:10   DG Chest 2 View  Result Date: 01/15/2021 CLINICAL DATA:  Pt arrived via [facility] with c/o upper abd pain, started a juice cleanse last Wednesday, states it was apple juice, Pt c/o N/V and abd pain. Last BM yesterday was loose, history of asthma, CHF, diabetes, GERD, hypertension, neuropathy EXAM: CHEST - 2 VIEW COMPARISON:  12/24/2018 FINDINGS: Stable changes from prior cardiac surgery  and valve replacements. Cardiac  silhouette is normal in size. No mediastinal or hilar masses or evidence of adenopathy. Dependent opacity at both lung bases consistent with atelectasis. Remainder of the lungs is clear. No convincing pleural effusion and no pneumothorax. Skeletal structures are intact. IMPRESSION: No acute cardiopulmonary disease. Electronically Signed   By: Lajean Manes M.D.   On: 01/15/2021 13:37   DG Shoulder Right  Result Date: 01/15/2021 CLINICAL DATA:  Right shoulder pain EXAM: RIGHT SHOULDER - 2+ VIEW COMPARISON:  None. FINDINGS: No fracture or malalignment. Moderate AC joint degenerative change with mild glenohumeral degenerative change. Small amount of calcific tendinopathy. IMPRESSION: 1. Degenerative changes without acute osseous abnormality. 2. Calcific tendinopathy Electronically Signed   By: Donavan Foil M.D.   On: 01/15/2021 18:08   US Venous Img Lower Bilateral (DVT)  Result Date: 01/20/2021 CLINICAL DATA:  Positive D-dimer.  History of DVT EXAM: BILATERAL LOWER EXTREMITY VENOUS DOPPLER ULTRASOUND TECHNIQUE: Gray-scale sonography with compression, as well as color and duplex ultrasound, were performed to evaluate the deep venous system(s) from the level of the common femoral vein through the popliteal and proximal calf veins. COMPARISON:  05/29/2016 right lower extremity Doppler ultrasound. FINDINGS: VENOUS Right: Normal compressibility of the common femoral, superficial femoral, and popliteal veins, as well as the visualized calf veins. Visualized portions of profunda femoral vein and great saphenous vein unremarkable. No filling defects to suggest DVT on grayscale or color Doppler imaging. Doppler waveforms show normal direction of venous flow, normal respiratory plasticity and response to augmentation. Left: Peripheral mid level echoes with incomplete compressibility at the left popliteal vein. The other left lower extremity deep veins showed normal color filling and compressibility. Present respiratory  phasicity. IMPRESSION: 1. Nonocclusive clot in the left popliteal vein that is likely nonacute. 2. Negative for right lower extremity DVT. Electronically Signed   By: Jorje Guild M.D.   On: 01/20/2021 06:28   IR Perc Cholecystostomy  Result Date: 01/16/2021 CLINICAL DATA:  Recurrent acute cholecystitis and status post prior percutaneous cholecystostomy tube placement on 09/26/2020. The tube was removed on 12/06/2020 after the patient insisted it be removed. She has developed recurrent acute cholecystitis and sepsis and is not a candidate currently for cholecystectomy. EXAM: PERCUTANEOUS CHOLECYSTOSTOMY COMPARISON:  CT of the abdomen and pelvis on 01/15/2021 ANESTHESIA/SEDATION: Moderate (conscious) sedation was employed during this procedure. A total of Versed 1.0 mg and Fentanyl 100 mcg was administered intravenously. Moderate Sedation Time: 25 minutes. The patient's level of consciousness and vital signs were monitored continuously by radiology nursing throughout the procedure under my direct supervision. CONTRAST:  11mL OMNIPAQUE IOHEXOL 350 MG/ML SOLN MEDICATIONS: No additional medications were administered. FLUOROSCOPY TIME:  30 seconds.  8.1 mGy. PROCEDURE: The procedure, risks, benefits, and alternatives were explained to the patient. Questions regarding the procedure were encouraged and answered. The patient understands and consents to the procedure. A time-out was performed prior to initiating the procedure. The right abdominal wall was prepped with chlorhexidine in a sterile fashion, and a sterile drape was applied covering the operative field. A sterile gown and sterile gloves were used for the procedure. Local anesthesia was provided with 1% Lidocaine. Ultrasound image documentation was performed. Fluoroscopy during the procedure and fluoro spot radiograph confirms appropriate catheter position. Ultrasound was utilized to localize the gallbladder. Under direct ultrasound guidance, an 18 gauge  trocar needle was advanced into the gallbladder lumen. Aspiration was performed and a bile sample sent for culture studies. A small amount of diluted contrast material was  injected. A guide wire was then advanced into the gallbladder. Percutaneous tract dilatation was then performed over a guide wire to 10-French. A 10-French pigtail drainage catheter was then advanced into the gallbladder lumen under fluoroscopy. Catheter was formed and injected with contrast material to confirm position. The catheter was flushed and connected to a gravity drainage bag. It was secured at the skin with a Prolene retention suture and Stat-Lock device. COMPLICATIONS: None FINDINGS: After needle puncture of the gallbladder, a bile sample was aspirated and sent for culture. Bile return was dark in color with debris. The cholecystostomy tube was advanced into the gallbladder lumen and formed. It is now draining bile. This tube will be left to gravity drainage. IMPRESSION: Percutaneous cholecystostomy with placement of 10-French drainage catheter into the gallbladder lumen. This was left to gravity drainage. Electronically Signed   By: Aletta Edouard M.D.   On: 01/16/2021 16:17   ECHOCARDIOGRAM COMPLETE  Result Date: 01/20/2021    ECHOCARDIOGRAM REPORT   Patient Name:   Gina Chambers Date of Exam: 01/20/2021 Medical Rec #:  [id number]     Height:       63.0 in Accession #:    [id number]    Weight:       195.1 lb Date of Birth:  1940/11/19    BSA:          1.913 m Patient Age:    80 years      BP:           96/56 mmHg Patient Gender: F             HR:           144 bpm. Exam Location:  [facility] Procedure: 2D Echo, Cardiac Doppler and Color Doppler Indications:     Atrial Flutter I48.92  History:         Patient has prior history of Echocardiogram examinations, most                  recent 09/26/2020. CHF; Risk Factors:Diabetes. DVT.                   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   Sonographer:     Sherrie Sport Referring Phys:  [id number] BRENDA MORRISON Diagnosing Phys: Serafina Royals MD IMPRESSIONS  1. Left ventricular ejection fraction, by estimation, is 65 to 70%. The left ventricle has normal function. The left ventricle has no regional wall motion abnormalities. Left ventricular diastolic parameters were normal.  2. Right ventricular systolic function is normal. The right ventricular size is normal.  3. Left atrial size was moderately dilated.  4. The mitral valve has been repaired/replaced. Trivial mitral valve regurgitation. 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 Echo findings are consistent with normal structure and function of the mitral valve prosthesis.  5. The aortic valve is normal in structure. Aortic valve regurgitation is trivial. FINDINGS  Left Ventricle: Left ventricular ejection fraction, by estimation, is 65 to 70%. The left ventricle has normal function. The left ventricle has no regional wall motion abnormalities. The left ventricular internal cavity size was small. There is no left ventricular hypertrophy. Left ventricular diastolic parameters were normal. Right Ventricle: The right ventricular size is normal. No increase in right ventricular wall thickness. Right ventricular systolic function is normal. Left Atrium: Left atrial size was moderately  dilated. Right Atrium: Right atrial size was normal in size. Pericardium: There is no evidence of pericardial effusion. Mitral Valve: The mitral valve has been repaired/replaced. Trivial mitral valve regurgitation. 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 Echo findings are consistent with normal structure and function of the mitral valve prosthesis. MV peak gradient, 13.0 mmHg. The mean mitral valve gradient is 6.0 mmHg. Tricuspid Valve: The tricuspid valve is normal in structure. Tricuspid valve regurgitation is trivial. Aortic Valve: The aortic valve is normal in structure. Aortic  valve regurgitation is trivial. Aortic valve mean gradient measures 17.3 mmHg. Aortic valve peak gradient measures 30.8 mmHg. Aortic valve area, by VTI measures 1.48 cm. Pulmonic Valve: The pulmonic valve was normal in structure. Pulmonic valve regurgitation is not visualized. Aorta: The aortic root and ascending aorta are structurally normal, with no evidence of dilitation. IAS/Shunts: No atrial level shunt detected by color flow Doppler.  LEFT VENTRICLE PLAX 2D LVIDd:         3.50 cm LVIDs:         2.50 cm LV PW:         1.10 cm LV IVS:        1.40 cm LVOT diam:     2.00 cm LV SV:         71 LV SV Index:   37 LVOT Area:     3.14 cm  RIGHT VENTRICLE RV Basal diam:  3.50 cm RV S prime:     14.80 cm/s TAPSE (M-mode): 3.5 cm LEFT ATRIUM             Index       RIGHT ATRIUM           Index LA diam:        2.80 cm 1.46 cm/m  RA Area:     15.60 cm LA Vol (A2C):   90.8 ml 47.45 ml/m RA Volume:   42.60 ml  22.26 ml/m LA Vol (A4C):   78.1 ml 40.82 ml/m LA Biplane Vol: 84.1 ml 43.95 ml/m  AORTIC VALVE                    PULMONIC VALVE AV Area (Vmax):    1.78 cm     PV Vmax:        0.30 m/s AV Area (Vmean):   1.99 cm     PV Peak grad:   0.4 mmHg AV Area (VTI):     1.48 cm     RVOT Peak grad: 3 mmHg AV Vmax:           277.67 cm/s AV Vmean:          191.333 cm/s AV VTI:            0.478 m AV Peak Grad:      30.8 mmHg AV Mean Grad:      17.3 mmHg LVOT Vmax:         157.00 cm/s LVOT Vmean:        121.000 cm/s LVOT VTI:          0.225 m LVOT/AV VTI ratio: 0.47  AORTA Ao Root diam: 2.00 cm MITRAL VALVE                TRICUSPID VALVE MV Area (PHT): 4.74 cm     TR Peak grad:   39.4 mmHg MV Area VTI:   1.76 cm     TR Vmax:  314.00 cm/s MV Peak grad:  13.0 mmHg MV Mean grad:  6.0 mmHg     SHUNTS MV Vmax:       1.80 m/s     Systemic VTI:  0.22 m MV Vmean:      110.7 cm/s   Systemic Diam: 2.00 cm MV Decel Time: 160 msec MV E velocity: 180.00 cm/s MV A velocity: 88.60 cm/s MV E/A ratio:  2.03 Serafina Royals MD  Electronically signed by Serafina Royals MD Signature Date/Time: 01/20/2021/12:39:30 PM    Final    US ABDOMEN LIMITED RUQ (LIVER/GB)  Result Date: 01/15/2021 CLINICAL DATA:  Right upper quadrant pain since this morning. History of a gallbladder drainage in June 2022. Drain removed in August 2022. EXAM: ULTRASOUND ABDOMEN LIMITED RIGHT UPPER QUADRANT COMPARISON:  Ultrasound, 09/26/2020. FINDINGS: Gallbladder: Gallbladder is distended. There are dependent stones and sludge. Wall thickened to between 4 and 8 mm. Small amount of pericholecystic fluid. Common bile duct: Diameter: 6 mm Liver: No focal lesion identified. Within normal limits in parenchymal echogenicity. Portal vein is patent on color Doppler imaging with normal direction of blood flow towards the liver. Other: Trace perihepatic fluid. IMPRESSION: 1. Distended gallbladder with wall thickening and dependent stones and sludge as well as a small amount of pericholecystic fluid. Findings are similar to the prior ultrasound and support acute cholecystitis in the proper clinical setting. Electronically Signed   By: Lajean Manes M.D.   On: 01/15/2021 14:13     Assessment and Recommendation  80 y.o. 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  Slowly improving.  Plan: -Continue management of atrial flutter with oral Cardizem and beta-blockers, currently well rate controlled -Continue supportive care and treatment for acute illness and infection -Begin cardiac rehabilitation -Continue Xarelto for further stroke risk reduction with atrial flutter -No further cardiac diagnostic  testing necessary at this time  Signed, Jettie Booze, PA-C

## 2021-01-27 DIAGNOSIS — K810 Acute cholecystitis: Secondary | ICD-10-CM | POA: Diagnosis not present

## 2021-01-27 LAB — COMPREHENSIVE METABOLIC PANEL
ALT: 32 U/L (ref 0–44)
AST: 40 U/L (ref 15–41)
Albumin: 2.3 g/dL — ABNORMAL LOW (ref 3.5–5.0)
Alkaline Phosphatase: 132 U/L — ABNORMAL HIGH (ref 38–126)
Anion gap: 9 (ref 5–15)
BUN: 18 mg/dL (ref 8–23)
CO2: 33 mmol/L — ABNORMAL HIGH (ref 22–32)
Calcium: 8.6 mg/dL — ABNORMAL LOW (ref 8.9–10.3)
Chloride: 93 mmol/L — ABNORMAL LOW (ref 98–111)
Creatinine, Ser: 0.87 mg/dL (ref 0.44–1.00)
GFR, Estimated: >60 mL/min (ref >60)
Glucose, Bld: 162 mg/dL — ABNORMAL HIGH (ref 70–99)
Potassium: 3.9 mmol/L (ref 3.5–5.1)
Sodium: 135 mmol/L (ref 135–145)
Total Bilirubin: 0.7 mg/dL (ref 0.3–1.2)
Total Protein: 6 g/dL — ABNORMAL LOW (ref 6.5–8.1)

## 2021-01-27 LAB — CBC WITH DIFFERENTIAL/PLATELET
Abs Immature Granulocytes: 0.39 10*3/uL — ABNORMAL HIGH (ref 0.00–0.07)
Basophils Absolute: 0.1 10*3/uL (ref 0.0–0.1)
Basophils Relative: 1 %
Eosinophils Absolute: 0.3 10*3/uL (ref 0.0–0.5)
Eosinophils Relative: 1 %
HCT: 35 % — ABNORMAL LOW (ref 36.0–46.0)
Hemoglobin: 11.3 g/dL — ABNORMAL LOW (ref 12.0–15.0)
Immature Granulocytes: 2 %
Lymphocytes Relative: 30 %
Lymphs Abs: 5.8 10*3/uL — ABNORMAL HIGH (ref 0.7–4.0)
MCH: 28.3 pg (ref 26.0–34.0)
MCHC: 32.3 g/dL (ref 30.0–36.0)
MCV: 87.7 fL (ref 80.0–100.0)
Monocytes Absolute: 1.3 10*3/uL — ABNORMAL HIGH (ref 0.1–1.0)
Monocytes Relative: 7 %
Neutro Abs: 11.4 10*3/uL — ABNORMAL HIGH (ref 1.7–7.7)
Neutrophils Relative %: 59 %
Platelets: 259 10*3/uL (ref 150–400)
RBC: 3.99 MIL/uL (ref 3.87–5.11)
RDW: 15.3 % (ref 11.5–15.5)
Smear Review: NORMAL
WBC: 19.4 10*3/uL — ABNORMAL HIGH (ref 4.0–10.5)
nRBC: 0 % (ref 0.0–0.2)

## 2021-01-27 LAB — GLUCOSE, CAPILLARY
Glucose-Capillary: 151 mg/dL — ABNORMAL HIGH (ref 70–99)
Glucose-Capillary: 166 mg/dL — ABNORMAL HIGH (ref 70–99)
Glucose-Capillary: 183 mg/dL — ABNORMAL HIGH (ref 70–99)
Glucose-Capillary: 208 mg/dL — ABNORMAL HIGH (ref 70–99)

## 2021-01-27 NOTE — Progress Notes (Signed)
[facility] Encounter Note  Patient: Gina Chambers / Admit Date: 01/15/2021 / Date of Encounter: 01/27/2021, 9:35 AM   Subjective: Patient has shown mild improvement with current abdominal discomfort and infection.  Patient's heart rate control is better today with heart rates in the 80s to 90s.  Patient currently has no evidence of congestive heart failure or acute coronary syndrome.  Echocardiogram had showed normal LV systolic function with normally functioning prosthetic valves and ejection fraction of 60%.  10/6 Patient appears better today.  Patient states that she has more energy and she was able to get more sleep last night.  She explained that she does not feel her racing heart as prominently as she had before and has been able to rest better.  10/7 patient appears stable again today.  Heart rate is well controlled between 60 and 90 bpm on average.  She is currently waiting on SNF placement  Review of Systems: Positive for: Fatigue Negative for: Vision change, hearing change, syncope, dizziness, nausea, vomiting,diarrhea, bloody stool, stomach pain, cough, congestion, diaphoresis, urinary frequency, urinary pain,skin lesions, skin rashes Others previously listed  Objective: Telemetry: Atrial flutter with variable heart rate Physical Exam: Blood pressure (!) 129/52, pulse 65, temperature 97.6 F (36.4 C), resp. rate 16, height 5\' 3"  (1.6 m), weight 86.6 kg, SpO2 100 %. Body mass index is 33.82 kg/m. General: Well developed, well nourished, in no acute distress. Head: Normocephalic, atraumatic, sclera non-icteric, no xanthomas, nares are without discharge. Neck: No apparent masses Lungs: Normal respirations with no wheezes, no rhonchi, no rales , no crackles   Heart: irregular rate and rhythm, normal S1 S2, no murmur, no rub, no gallop, PMI is normal size and placement, carotid upstroke normal without bruit, jugular venous pressure normal Abdomen: Soft,  non-tender, non-distended with normoactive bowel sounds. No hepatosplenomegaly. Abdominal aorta is normal size without bruit Extremities: Trace edema, no clubbing, no cyanosis, no ulcers,  Peripheral: 2+ radial, 2+ femoral, 2+ dorsal pedal pulses Neuro: Alert and oriented. Moves all extremities spontaneously. Psych:  Responds to questions appropriately with a normal affect.   Intake/Output Summary (Last 24 hours) at 01/27/2021 0935 Last data filed at 01/27/2021 0754 Gross per 24 hour  Intake 960 ml  Output 1510 ml  Net -550 ml     Inpatient Medications:  . amoxicillin-clavulanate  1 tablet Oral Q12H  . aspirin EC  81 mg Oral QHS  . diltiazem  180 mg Oral BID  . fluticasone  2 puff Inhalation BID  . gabapentin  100 mg Oral QID  . insulin aspart  0-5 Units Subcutaneous QHS  . insulin aspart  0-9 Units Subcutaneous TID WC  . insulin glargine-yfgn  10 Units Subcutaneous Daily   And  . insulin glargine-yfgn  5 Units Subcutaneous QHS  . levothyroxine  125 mcg Oral Q0600  . metoprolol succinate  50 mg Oral Daily  . montelukast  10 mg Oral QHS  . multivitamin with minerals   Oral Daily  . pantoprazole  20 mg Oral Daily  . rivaroxaban  20 mg Oral Q supper  . sodium chloride flush  5 mL Intracatheter Q8H  . spironolactone  25 mg Oral Daily  . torsemide  40 mg Oral BID   Infusions:  . sodium chloride      Labs: Recent Labs    01/25/21 0622 01/27/21 0632  NA 134* 135  K 3.6 3.9  CL 93* 93*  CO2 31 33*  GLUCOSE 158* 162*  BUN 15  18  CREATININE 0.77 0.87  CALCIUM 8.3* 8.6*    Recent Labs    01/27/21 0632  AST 40  ALT 32  ALKPHOS 132*  BILITOT 0.7  PROT 6.0*  ALBUMIN 2.3*   Recent Labs    01/26/21 0818 01/27/21 0632  WBC 19.5* 19.4*  NEUTROABS 11.0* 11.4*  HGB 11.5* 11.3*  HCT 34.9* 35.0*  MCV 87.0 87.7  PLT 252 259    No results for input(s): CKTOTAL, CKMB, TROPONINI in the last 72 hours. Invalid input(s): POCBNP No results for input(s): HGBA1C in the  last 72 hours.   Weights: Filed Weights   01/25/21 0500 01/26/21 0623 01/27/21 0428  Weight: 93 kg 87.5 kg 86.6 kg     Radiology/Studies:  CT ABDOMEN PELVIS WO CONTRAST  Result Date: 01/19/2021 CLINICAL DATA:  Abdominal distention. Cholecystitis status post percutaneous cholecystostomy tube placement. Continued leukocytosis, pain. EXAM: CT ABDOMEN AND PELVIS WITHOUT CONTRAST TECHNIQUE: Multidetector CT imaging of the abdomen and pelvis was performed following the standard protocol without IV contrast. COMPARISON:  01/15/2021 FINDINGS: Lower chest: New moderate right pleural effusion with compressive atelectasis/infiltrate in the right lower lobe. Hepatobiliary: Post percutaneous cholecystostomy tube placement. The gallbladder is decompressed. No biliary ductal dilatation. Nodular contours of the liver compatible with cirrhosis. Pancreas: No focal abnormality or ductal dilatation. Spleen: No focal abnormality.  Normal size. Adrenals/Urinary Tract: Perinephric stranding bilaterally, increasing since prior study. No hydronephrosis. Adrenal glands and urinary bladder grossly unremarkable. Stomach/Bowel: Postoperative changes in the stomach. No bowel obstruction or acute finding. Vascular/Lymphatic: Aortic atherosclerosis. No evidence of aneurysm or adenopathy. Reproductive: Uterus and adnexa unremarkable.  No mass. Other: Trace free fluid in the cul-de-sac. Increasing perihepatic ascites. Musculoskeletal: No acute bony abnormality. IMPRESSION: Interval percutaneous cholecystostomy tube placement with decompression of the gallbladder. Changes of cirrhosis.  Increasing perihepatic ascites. Moderate right pleural effusion with right lower lobe atelectasis or infiltrate. Electronically Signed   By: Rolm Baptise M.D.   On: 01/19/2021 10:30   CT ABDOMEN PELVIS WO CONTRAST  Result Date: 01/15/2021 CLINICAL DATA:  Abdominal distension and right upper quadrant abdominal pain. History of cholecystostomy tube  removal on 12/06/2020 EXAM: CT ABDOMEN AND PELVIS WITHOUT CONTRAST TECHNIQUE: Multidetector CT imaging of the abdomen and pelvis was performed following the standard protocol without IV contrast. COMPARISON:  CT 09/23/2020, ultrasound 01/15/2021 FINDINGS: Lower chest: Bibasilar dependent atelectasis. Heart size is normal. No pericardial effusion. Hepatobiliary: Gallbladder is moderately distended with multiple hyperdense gallstones. Gallbladder wall appears diffusely thickened with pericholecystic inflammatory fat stranding and small volume of adjacent fluid. Unenhanced liver is within normal limits. Small volume perihepatic ascites. Pancreas: Unremarkable. No pancreatic ductal dilatation or surrounding inflammatory changes. Spleen: Unremarkable. Adrenals/Urinary Tract: Slightly nodular configuration of the left adrenal gland is unchanged. Unremarkable right adrenal gland. Bilateral kidneys are within normal limits. No renal stone or hydronephrosis. Urinary bladder is unremarkable. Stomach/Bowel: Postsurgical changes again noted at the stomach. No evidence of bowel wall thickening, distention, or inflammatory changes. Vascular/Lymphatic: Scattered aortoiliac atherosclerotic calcifications without aneurysm. No abdominopelvic lymphadenopathy. Reproductive: Uterus and bilateral adnexa are unremarkable. Other: No organized abdominopelvic fluid collection. No pneumoperitoneum. No abdominal wall hernia. Musculoskeletal: No new or acute bony findings. IMPRESSION: 1. Appearance of the gallbladder remains suspicious for acute cholecystitis. 2. Small volume perihepatic ascites. Aortic Atherosclerosis (ICD10-I70.0). Electronically Signed   By: Davina Poke D.O.   On: 01/15/2021 15:10   DG Chest 2 View  Result Date: 01/15/2021 CLINICAL DATA:  Pt arrived via [facility] with c/o upper abd pain, started a  juice cleanse last Wednesday, states it was apple juice, Pt c/o N/V and abd pain. Last BM yesterday was loose, history of  asthma, CHF, diabetes, GERD, hypertension, neuropathy EXAM: CHEST - 2 VIEW COMPARISON:  12/24/2018 FINDINGS: Stable changes from prior cardiac surgery and valve replacements. Cardiac silhouette is normal in size. No mediastinal or hilar masses or evidence of adenopathy. Dependent opacity at both lung bases consistent with atelectasis. Remainder of the lungs is clear. No convincing pleural effusion and no pneumothorax. Skeletal structures are intact. IMPRESSION: No acute cardiopulmonary disease. Electronically Signed   By: Lajean Manes M.D.   On: 01/15/2021 13:37   DG Shoulder Right  Result Date: 01/15/2021 CLINICAL DATA:  Right shoulder pain EXAM: RIGHT SHOULDER - 2+ VIEW COMPARISON:  None. FINDINGS: No fracture or malalignment. Moderate AC joint degenerative change with mild glenohumeral degenerative change. Small amount of calcific tendinopathy. IMPRESSION: 1. Degenerative changes without acute osseous abnormality. 2. Calcific tendinopathy Electronically Signed   By: Donavan Foil M.D.   On: 01/15/2021 18:08   US Venous Img Lower Bilateral (DVT)  Result Date: 01/20/2021 CLINICAL DATA:  Positive D-dimer.  History of DVT EXAM: BILATERAL LOWER EXTREMITY VENOUS DOPPLER ULTRASOUND TECHNIQUE: Gray-scale sonography with compression, as well as color and duplex ultrasound, were performed to evaluate the deep venous system(s) from the level of the common femoral vein through the popliteal and proximal calf veins. COMPARISON:  05/29/2016 right lower extremity Doppler ultrasound. FINDINGS: VENOUS Right: Normal compressibility of the common femoral, superficial femoral, and popliteal veins, as well as the visualized calf veins. Visualized portions of profunda femoral vein and great saphenous vein unremarkable. No filling defects to suggest DVT on grayscale or color Doppler imaging. Doppler waveforms show normal direction of venous flow, normal respiratory plasticity and response to augmentation. Left: Peripheral mid  level echoes with incomplete compressibility at the left popliteal vein. The other left lower extremity deep veins showed normal color filling and compressibility. Present respiratory phasicity. IMPRESSION: 1. Nonocclusive clot in the left popliteal vein that is likely nonacute. 2. Negative for right lower extremity DVT. Electronically Signed   By: Jorje Guild M.D.   On: 01/20/2021 06:28   IR Perc Cholecystostomy  Result Date: 01/16/2021 CLINICAL DATA:  Recurrent acute cholecystitis and status post prior percutaneous cholecystostomy tube placement on 09/26/2020. The tube was removed on 12/06/2020 after the patient insisted it be removed. She has developed recurrent acute cholecystitis and sepsis and is not a candidate currently for cholecystectomy. EXAM: PERCUTANEOUS CHOLECYSTOSTOMY COMPARISON:  CT of the abdomen and pelvis on 01/15/2021 ANESTHESIA/SEDATION: Moderate (conscious) sedation was employed during this procedure. A total of Versed 1.0 mg and Fentanyl 100 mcg was administered intravenously. Moderate Sedation Time: 25 minutes. The patient's level of consciousness and vital signs were monitored continuously by radiology nursing throughout the procedure under my direct supervision. CONTRAST:  72mL OMNIPAQUE IOHEXOL 350 MG/ML SOLN MEDICATIONS: No additional medications were administered. FLUOROSCOPY TIME:  30 seconds.  8.1 mGy. PROCEDURE: The procedure, risks, benefits, and alternatives were explained to the patient. Questions regarding the procedure were encouraged and answered. The patient understands and consents to the procedure. A time-out was performed prior to initiating the procedure. The right abdominal wall was prepped with chlorhexidine in a sterile fashion, and a sterile drape was applied covering the operative field. A sterile gown and sterile gloves were used for the procedure. Local anesthesia was provided with 1% Lidocaine. Ultrasound image documentation was performed. Fluoroscopy during  the procedure and fluoro spot radiograph confirms appropriate  catheter position. Ultrasound was utilized to localize the gallbladder. Under direct ultrasound guidance, an 18 gauge trocar needle was advanced into the gallbladder lumen. Aspiration was performed and a bile sample sent for culture studies. A small amount of diluted contrast material was injected. A guide wire was then advanced into the gallbladder. Percutaneous tract dilatation was then performed over a guide wire to 10-French. A 10-French pigtail drainage catheter was then advanced into the gallbladder lumen under fluoroscopy. Catheter was formed and injected with contrast material to confirm position. The catheter was flushed and connected to a gravity drainage bag. It was secured at the skin with a Prolene retention suture and Stat-Lock device. COMPLICATIONS: None FINDINGS: After needle puncture of the gallbladder, a bile sample was aspirated and sent for culture. Bile return was dark in color with debris. The cholecystostomy tube was advanced into the gallbladder lumen and formed. It is now draining bile. This tube will be left to gravity drainage. IMPRESSION: Percutaneous cholecystostomy with placement of 10-French drainage catheter into the gallbladder lumen. This was left to gravity drainage. Electronically Signed   By: Aletta Edouard M.D.   On: 01/16/2021 16:17   ECHOCARDIOGRAM COMPLETE  Result Date: 01/20/2021    ECHOCARDIOGRAM REPORT   Patient Name:   DEBBERA WOLKEN Date of Exam: 01/20/2021 Medical Rec #:  [id number]     Height:       63.0 in Accession #:    [id number]    Weight:       195.1 lb Date of Birth:  [birth year]    BSA:          1.913 m Patient Age:    14 years      BP:           96/56 mmHg Patient Gender: F             HR:           144 bpm. Exam Location:  [facility] Procedure: 2D Echo, Cardiac Doppler and Color Doppler Indications:     Atrial Flutter I48.92  History:         Patient has prior history of Echocardiogram examinations,  most                  recent 09/26/2020. CHF; Risk Factors:Diabetes. DVT.                   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  Sonographer:     Sherrie Sport Referring Phys:  [id number] BRENDA MORRISON Diagnosing Phys: Serafina Royals MD IMPRESSIONS  1. Left ventricular ejection fraction, by estimation, is 65 to 70%. The left ventricle has normal function. The left ventricle has no regional wall motion abnormalities. Left ventricular diastolic parameters were normal.  2. Right ventricular systolic function is normal. The right ventricular size is normal.  3. Left atrial size was moderately dilated.  4. The mitral valve has been repaired/replaced. Trivial mitral valve regurgitation. 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 Echo findings are consistent with normal structure and function of the mitral valve prosthesis.  5. The aortic valve is normal in structure. Aortic valve regurgitation is trivial. FINDINGS  Left Ventricle: Left ventricular ejection fraction, by estimation, is 65 to 70%. The left ventricle has normal function. The left ventricle has no regional wall motion abnormalities. The left ventricular internal cavity  size was small. There is no left ventricular hypertrophy. Left ventricular diastolic parameters were normal. Right Ventricle: The right ventricular size is normal. No increase in right ventricular wall thickness. Right ventricular systolic function is normal. Left Atrium: Left atrial size was moderately dilated. Right Atrium: Right atrial size was normal in size. Pericardium: There is no evidence of pericardial effusion. Mitral Valve: The mitral valve has been repaired/replaced. Trivial mitral valve regurgitation. 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 Echo findings are consistent with normal structure and function of the mitral valve prosthesis. MV peak gradient, 13.0 mmHg. The mean mitral valve  gradient is 6.0 mmHg. Tricuspid Valve: The tricuspid valve is normal in structure. Tricuspid valve regurgitation is trivial. Aortic Valve: The aortic valve is normal in structure. Aortic valve regurgitation is trivial. Aortic valve mean gradient measures 17.3 mmHg. Aortic valve peak gradient measures 30.8 mmHg. Aortic valve area, by VTI measures 1.48 cm. Pulmonic Valve: The pulmonic valve was normal in structure. Pulmonic valve regurgitation is not visualized. Aorta: The aortic root and ascending aorta are structurally normal, with no evidence of dilitation. IAS/Shunts: No atrial level shunt detected by color flow Doppler.  LEFT VENTRICLE PLAX 2D LVIDd:         3.50 cm LVIDs:         2.50 cm LV PW:         1.10 cm LV IVS:        1.40 cm LVOT diam:     2.00 cm LV SV:         71 LV SV Index:   37 LVOT Area:     3.14 cm  RIGHT VENTRICLE RV Basal diam:  3.50 cm RV S prime:     14.80 cm/s TAPSE (M-mode): 3.5 cm LEFT ATRIUM             Index       RIGHT ATRIUM           Index LA diam:        2.80 cm 1.46 cm/m  RA Area:     15.60 cm LA Vol (A2C):   90.8 ml 47.45 ml/m RA Volume:   42.60 ml  22.26 ml/m LA Vol (A4C):   78.1 ml 40.82 ml/m LA Biplane Vol: 84.1 ml 43.95 ml/m  AORTIC VALVE                    PULMONIC VALVE AV Area (Vmax):    1.78 cm     PV Vmax:        0.30 m/s AV Area (Vmean):   1.99 cm     PV Peak grad:   0.4 mmHg AV Area (VTI):     1.48 cm     RVOT Peak grad: 3 mmHg AV Vmax:           277.67 cm/s AV Vmean:          191.333 cm/s AV VTI:            0.478 m AV Peak Grad:      30.8 mmHg AV Mean Grad:      17.3 mmHg LVOT Vmax:         157.00 cm/s LVOT Vmean:        121.000 cm/s LVOT VTI:          0.225 m LVOT/AV VTI ratio: 0.47  AORTA Ao Root diam: 2.00 cm MITRAL VALVE  TRICUSPID VALVE MV Area (PHT): 4.74 cm     TR Peak grad:   39.4 mmHg MV Area VTI:   1.76 cm     TR Vmax:        314.00 cm/s MV Peak grad:  13.0 mmHg MV Mean grad:  6.0 mmHg     SHUNTS MV Vmax:       1.80 m/s     Systemic  VTI:  0.22 m MV Vmean:      110.7 cm/s   Systemic Diam: 2.00 cm MV Decel Time: 160 msec MV E velocity: 180.00 cm/s MV A velocity: 88.60 cm/s MV E/A ratio:  2.03 Serafina Royals MD Electronically signed by Serafina Royals MD Signature Date/Time: 01/20/2021/12:39:30 PM    Final    US ABDOMEN LIMITED RUQ (LIVER/GB)  Result Date: 01/15/2021 CLINICAL DATA:  Right upper quadrant pain since this morning. History of a gallbladder drainage in June 2022. Drain removed in August 2022. EXAM: ULTRASOUND ABDOMEN LIMITED RIGHT UPPER QUADRANT COMPARISON:  Ultrasound, 09/26/2020. FINDINGS: Gallbladder: Gallbladder is distended. There are dependent stones and sludge. Wall thickened to between 4 and 8 mm. Small amount of pericholecystic fluid. Common bile duct: Diameter: 6 mm Liver: No focal lesion identified. Within normal limits in parenchymal echogenicity. Portal vein is patent on color Doppler imaging with normal direction of blood flow towards the liver. Other: Trace perihepatic fluid. IMPRESSION: 1. Distended gallbladder with wall thickening and dependent stones and sludge as well as a small amount of pericholecystic fluid. Findings are similar to the prior ultrasound and support acute cholecystitis in the proper clinical setting. Electronically Signed   By: Lajean Manes M.D.   On: 01/15/2021 14:13     Assessment and Recommendation  80 y.o. 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  Slowly improving.  Plan: -Continue management of atrial flutter with oral Cardizem and beta-blockers, currently well rate controlled -Continue supportive care and  treatment for acute illness and infection -Begin cardiac rehabilitation -Continue Xarelto for further stroke risk reduction with atrial flutter -No further cardiac diagnostic testing necessary at this time -Patient is currently stable from a cardiovascular standpoint.  Cardiology will sign off at this time.  Please call with any questions, Dr. Clayborn Bigness will be on-call this weekend.  Signed, Jettie Booze, PA-C

## 2021-01-27 NOTE — Progress Notes (Signed)
Physical Therapy Treatment Patient Details Name: Gina Chambers MRN: [id number] DOB: 1941-02-17 Today's Date: 01/27/2021   History of Present Illness 80 y.o. female with medical history significant of hypertension, diabetes mellitus, COPD, asthma, GERD, hypothyroidism, OSA on CPAP, DVT on Xarelto, dCHF, vertigo, GI bleeding, COVID-19 infection, cholecystitis, who presents with abdominal pain. S/p cholecystostomy tube on 9/26.    PT Comments    Patient received at sink with OT assisting to wash her hair. Patient appears much improved. Able to perform sit to stand transfers with supervision. Ambulated around nurses station with min guard and RW. Ambulated to BR and back to recliner with min guard and no AD device. Slightly unsteady reaching for walls, counters for stability. Patient will continue to benefit from skilled PT while here to continue to build LE strength and functional independence.      Recommendations for follow up therapy are one component of a multi-disciplinary discharge planning process, led by the attending physician.  Recommendations may be updated based on patient status, additional functional criteria and insurance authorization.  Follow Up Recommendations  Home health PT     Equipment Recommendations  Rolling walker with 5" wheels    Recommendations for Other Services       Precautions / Restrictions Precautions Precautions: Fall Restrictions Weight Bearing Restrictions: No     Mobility  Bed Mobility               General bed mobility comments: patient received standing at sink with OT present in room. Returned to recliner at end of session    Transfers Overall transfer level: Modified independent Equipment used: Rolling walker (2 wheeled) Transfers: Sit to/from Stand Sit to Stand: Modified independent (Device/Increase time)         General transfer comment: Able to stand from low bed and low commode with supervision  only  Ambulation/Gait Ambulation/Gait assistance: Min guard Gait Distance (Feet): 200 Feet Assistive device: Rolling walker (2 wheeled) Gait Pattern/deviations: Step-through pattern Gait velocity: decreased   General Gait Details: Ambulated around nurses station with RW and min guard. No significant difficulties. Ambulated in room without AD and min guard to bathroom and back to recliner.   Stairs             Wheelchair Mobility    Modified Rankin (Stroke Patients Only)       Balance Overall balance assessment: Needs assistance Sitting-balance support: Feet supported Sitting balance-Leahy Scale: Good     Standing balance support: Bilateral upper extremity supported;During functional activity;Single extremity supported Standing balance-Leahy Scale: Fair Standing balance comment: patient relies on at lease one UE support with mobility in room                            Cognition Arousal/Alertness: Awake/alert Behavior During Therapy: [facility] for tasks assessed/performed Overall Cognitive Status: Within Functional Limits for tasks assessed                                        Exercises      General Comments        Pertinent Vitals/Pain Pain Assessment: No/denies pain Pain Intervention(s): Monitored during session    Home Living                      Prior Function  PT Goals (current goals can now be found in the care plan section) Acute Rehab PT Goals Patient Stated Goal: to walk again, build strength PT Goal Formulation: With patient Time For Goal Achievement: 02/02/21 Potential to Achieve Goals: Good Progress towards PT goals: Progressing toward goals    Frequency    Min 2X/week      PT Plan Discharge plan needs to be updated    Co-evaluation              AM-PAC PT "6 Clicks" Mobility   Outcome Measure  Help needed turning from your back to your side while in a flat bed without using  bedrails?: A Little Help needed moving from lying on your back to sitting on the side of a flat bed without using bedrails?: A Little Help needed moving to and from a bed to a chair (including a wheelchair)?: A Little Help needed standing up from a chair using your arms (e.g., wheelchair or bedside chair)?: A Little Help needed to walk in hospital room?: A Little Help needed climbing 3-5 steps with a railing? : A Little 6 Click Score: 18    End of Session Equipment Utilized During Treatment: Gait belt Activity Tolerance: Patient tolerated treatment well Patient left: in chair;with call bell/phone within reach Nurse Communication: Mobility status PT Visit Diagnosis: Muscle weakness (generalized) (M62.81)     Time: 0881-1031 PT Time Calculation (min) (ACUTE ONLY): 23 min  Charges:  $Gait Training: 23-37 mins                     Khalilah Hoke, PT, GCS 01/27/21,12:36 PM

## 2021-01-27 NOTE — Progress Notes (Signed)
PROGRESS NOTE  Gina Chambers TTS:[id number] DOB: 10-11-1940 DOA: 01/15/2021 PCP: Kirk Ruths, MD  Brief History   80 y.o. female with medical history significant of hypertension, diabetes mellitus, COPD, asthma, GERD, hypothyroidism, OSA on CPAP, DVT on Xarelto, dCHF, vertigo, GI bleeding, COVID-19 infection, cholecystitis admitted for worsening abdominal pain.  She was diagnosed with acute cholecystitis with cholelithiasis.  Cholecystostomy tube was placed on 9/26, was on IV Abx and switched to PO Augmentin on 10/3   Patient developed atrial flutter with rapid ventricular response 9/30, seen by cardiology, given diltiazem, Toprol and digoxin. Heart rate is much better controlled. Cardiology has recommended cardiac rehab. No further cardiac testing is anticipated.   Waiting on SNF placement. The patient is not interested in hospice.  Consultants  Cardiology Palliative Care General surgery Interventional radiology  Procedures  Cholecystostomy tube placement 01/16/2021  Antibiotics   Anti-infectives (From admission, onward)    Start     Dose/Rate Route Frequency Ordered Stop   01/23/21 2200  amoxicillin-clavulanate (AUGMENTIN) 875-125 MG per tablet 1 tablet        1 tablet Oral Every 12 hours 01/23/21 1146 01/30/21 0959   01/20/21 1600  Ampicillin-Sulbactam (UNASYN) 3 g in sodium chloride 0.9 % 100 mL IVPB        3 g 200 mL/hr over 30 Minutes Intravenous Every 6 hours 01/20/21 1457 01/23/21 1706   01/15/21 2200  piperacillin-tazobactam (ZOSYN) IVPB 3.375 g  Status:  Discontinued        3.375 g 12.5 mL/hr over 240 Minutes Intravenous Every 8 hours 01/15/21 1626 01/20/21 1457   01/15/21 1545  piperacillin-tazobactam (ZOSYN) IVPB 3.375 g        3.375 g 100 mL/hr over 30 Minutes Intravenous  Once 01/15/21 1543 01/15/21 1715      Subjective  The patient is resting comfortably. She wants to get up and be more active. She does not want hospice. She also wants to talk to  surgery about why she can't have her gallbladder removed.  Objective   Vitals:  Vitals:   01/27/21 1140 01/27/21 1541  BP: (!) 123/58 (!) 113/49  Pulse: 66 83  Resp: 16 16  Temp: 98.1 F (36.7 C) 97.9 F (36.6 C)  SpO2: 100% 99%    Exam:  Constitutional:  Awake, alert, and oriented x 3. No acute distress. Respiratory:  CTA bilaterally, no w/r/r.  Respiratory effort normal. No retractions or accessory muscle use Cardiovascular:  RRR, no m/r/g No LE extremity edema   Normal pedal pulses Abdomen:  Abdomen appears normal; no tenderness or masses No hernias No HSM Musculoskeletal:  Digits/nails BUE: no clubbing, cyanosis, petechiae, infection exam of joints, bones, muscles of at least one of following: head/neck, RUE, LUE, RLE, LLE   Skin:  No rashes, lesions, ulcers palpation of skin: no induration or nodules Neurologic:  CN 2-12 intact Sensation all 4 extremities intact Psychiatric:  Mental status: Depressed Mood, affect appropriate Orientation to person, place, time   I have personally reviewed the following:   Today's Data  Vitals  Lab Data  CBC - pending  Imaging  CT abdomen and pelvis  Cardiology Data  EKG Echocardiogram  Scheduled Meds:  amoxicillin-clavulanate  1 tablet Oral Q12H   aspirin EC  81 mg Oral QHS   diltiazem  180 mg Oral BID   fluticasone  2 puff Inhalation BID   gabapentin  100 mg Oral QID   insulin aspart  0-5 Units Subcutaneous QHS   insulin aspart  0-9 Units Subcutaneous TID WC   insulin glargine-yfgn  10 Units Subcutaneous Daily   And   insulin glargine-yfgn  5 Units Subcutaneous QHS   levothyroxine  125 mcg Oral Q0600   metoprolol succinate  50 mg Oral Daily   montelukast  10 mg Oral QHS   multivitamin with minerals   Oral Daily   pantoprazole  20 mg Oral Daily   rivaroxaban  20 mg Oral Q supper   sodium chloride flush  5 mL Intracatheter Q8H   spironolactone  25 mg Oral Daily   torsemide  40 mg Oral BID    Continuous Infusions:  sodium chloride      Principal Problem:   Acute cholecystitis Active Problems:   Leukocytosis   Generalized weakness   Acquired hypothyroidism   Essential hypertension   Type 2 diabetes mellitus without complication (HCC)   Hyponatremia   COPD (chronic obstructive pulmonary disease) (HCC)   DVT (deep venous thrombosis) (HCC)   OSA on CPAP   Chronic diastolic CHF (congestive heart failure) (HCC)   Sepsis (HCC)   Right shoulder pain   Paroxysmal atrial flutter ([place])   LOS: 12 days   A & P  Hospital Problems * Acute cholecystitis S/p IR guided cholecystostomy tube on 9/26.  Tube will need to stay in place for 6 to 8 weeks per surgery.  On oral Augmentin. WBC decreasing. Dr Allyne Gee discussed cholecystectomy with the patient in 6-8 weeks when her tube is removed, she will see him as outpatient and be evaluated for possible cholecystectomy.   Paroxysmal atrial flutter (HCC) Patient's heart rate at rest in 110s to 130s.  Cardiology following.  continue Cardizem CD 180 mg twice a day.  Continue metoprolol (increased to 50 mg today) and Xarelto. Cardiology has recommended cardiac rehab for patient upon discharge.    Acquired hypothyroidism Continue Synthroid   Generalized weakness Improving. Multifactorial.  PT and OT recommend Home with home health now.  TOC aware and working on arrangements.   Leukocytosis Decreasing. Monitor.    I have seen and examined this patient myself. I have spent 38 minutes in her evaluation and care.   Gina Ellithorpe, DO [facility] Direct contact: see [email or web address]  7PM-7AM contact night coverage as above 01/26/2021, 6:40 PM  LOS: 10 days

## 2021-01-27 NOTE — Progress Notes (Signed)
[facility]) Hospital Liaison Note  Patient is not eligible for Hospice Home. Patient is eligible for hospice at home or hospice in a facility. Notified patient, family and hospital care team.  Hospital liaison will continue to follow for discharge disposition.   Please do not hesitate to call with any hospice related questions or concerns.   Thank you,   Bobbie "Loren Racer, [place], BSN [facility] Liaison [phone]

## 2021-01-27 NOTE — Progress Notes (Signed)
Occupational Therapy Treatment Patient Details Name: Gina Chambers MRN: [id number] DOB: January 20, 1941 Today's Date: 01/27/2021   History of present illness 80 y.o. female with medical history significant of hypertension, diabetes mellitus, COPD, asthma, GERD, hypothyroidism, OSA on CPAP, DVT on Xarelto, dCHF, vertigo, GI bleeding, COVID-19 infection, cholecystitis, who presents with abdominal pain. S/p cholecystostomy tube on 9/26.   OT comments  Pt seen for OT tx this date to f/u re: safety with ADLs/ADL mobility. Pt requires gentle encouragement and is able to come to sitting with SUPV with use of bed rails. Pt demos G sitting balance. Pt CTS with SUPV with single UE support. Pt standing sink-side with cues for safety and CGA to SBA to perform grooming tasks and UB bathing tasks. PT presents for ambulation as OT is wrapping up ADLs. Will continue to follow. Pt demonstrating progress with fxl mobility and ADLs.    Recommendations for follow up therapy are one component of a multi-disciplinary discharge planning process, led by the attending physician.  Recommendations may be updated based on patient status, additional functional criteria and insurance authorization.    Follow Up Recommendations  Home health OT    Equipment Recommendations  3 in 1 bedside commode;Tub/shower seat;Other (comment) (2ww)    Recommendations for Other Services      Precautions / Restrictions Precautions Precautions: Fall Precaution Comments: monitor HR Restrictions Weight Bearing Restrictions: No       Mobility Bed Mobility Overal bed mobility: Needs Assistance Bed Mobility: Supine to Sit     Supine to sit: Supervision;HOB elevated     General bed mobility comments: increased time    Transfers Overall transfer level: Modified independent Equipment used: Rolling walker (2 wheeled) Transfers: Sit to/from Stand Sit to Stand: Supervision;Modified independent (Device/Increase time)          General transfer comment: increasd time, cues for hand placement with RW, good control    Balance Overall balance assessment: Needs assistance Sitting-balance support: Feet supported Sitting balance-Leahy Scale: Good     Standing balance support: Bilateral upper extremity supported;During functional activity;Single extremity supported Standing balance-Leahy Scale: Fair Standing balance comment: benefits from at least unilateral UE support                           ADL either performed or assessed with clinical judgement   ADL Overall ADL's : Needs assistance/impaired     Grooming: Wash/dry face;Set up;Supervision/safety;Standing   Upper Body Bathing: Min guard;Minimal assistance;Standing Upper Body Bathing Details (indicate cue type and reason): sink-side to wash hair                         Functional mobility during ADLs: Supervision/safety (hand held assist versus no support to take ~4 steps from bed to sink-side to stand for grooming/bathing tasks.)       Vision Baseline Vision/History: 1 Wears glasses Patient Visual Report: No change from baseline     Perception     Praxis      Cognition Arousal/Alertness: Awake/alert Behavior During Therapy: WFL for tasks assessed/performed Overall Cognitive Status: Within Functional Limits for tasks assessed                                 General Comments: somewhat emotional related to conflicting reports regarding her prognosis over the last week from physicians        Exercises  Other Exercises Other Exercises: OT engages pt in standing ADLs   Shoulder Instructions       General Comments      Pertinent Vitals/ Pain       Pain Assessment: No/denies pain Pain Intervention(s): Monitored during session  Home Living                                          Prior Functioning/Environment              Frequency  Min 2X/week        Progress Toward  Goals  OT Goals(current goals can now be found in the care plan section)  Progress towards OT goals: Progressing toward goals  Acute Rehab OT Goals Patient Stated Goal: to walk again, build strength OT Goal Formulation: With patient Time For Goal Achievement: 02/05/21 Potential to Achieve Goals: [place] Discharge plan needs to be updated;Frequency remains appropriate    Co-evaluation                 AM-PAC OT "6 Clicks" Daily Activity     Outcome Measure   Help from another person eating meals?: None Help from another person taking care of personal grooming?: A Little Help from another person toileting, which includes using toliet, bedpan, or urinal?: A Little Help from another person bathing (including washing, rinsing, drying)?: A Little Help from another person to put on and taking off regular upper body clothing?: A Little Help from another person to put on and taking off regular lower body clothing?: A Little 6 Click Score: 19    End of Session Equipment Utilized During Treatment: Rolling walker  OT Visit Diagnosis: Unsteadiness on feet (R26.81);Muscle weakness (generalized) (M62.81)   Activity Tolerance Patient tolerated treatment well   Patient Left in bed;with call bell/phone within reach;with bed alarm set   Nurse Communication Mobility status        Time: 9678-9381 OT Time Calculation (min): 21 min  Charges: OT General Charges $OT Visit: 1 Visit OT Treatments $Self Care/Home Management : 8-22 mins  Gerrianne Scale, MS, OTR/L ascom [phone] 01/27/21, 3:17 PM

## 2021-01-27 NOTE — Progress Notes (Signed)
01/27/21  Asked by Dr. Benny Lennert to come see and talk with the patient about her gallbladder.  She has had two recent admissions for cholecystitis.  Her first episode was on 09/23/20 at which time she had cholecystitis and COVID-19 infection.  She was seen by Dr. Celine Ahr in consult and cholecystostomy tube was placed.  Patient was eventually discharged and on outpatient follow up with Dr. Celine Ahr, her cholecystostomy tube was removed at the patient's request.  Dr. Celine Ahr also felt that the patient was too high risk for surgical intervention.  The patient then presented on 01/15/21 again with abdominal pain and found to have recurrent cholecystitis.  She again needed percutaneous cholecystostomy tube placement.    The patient expressed to me today that she's frustrated about having two close admissions with cholecystitis, having the percutaneous drain, and she reports being in fear of when the next episode would happen again.  She is used to being more independent and feels that these hospitalizations have set her back and she does not want to be afraid or wondering when the next episode will happen.    I discussed with her that on her initial presentation, her cholecystitis was complicated by a TOIZT-24 infection and thus the drain was the right choice.  She also has multiple medical comorbidities that played a role in recommending percutaneous drain placement on this admission, which include COPD, CHF, DM, OSA on CPAP, DVT on Xarelto.  On this admission, she also has developed atrial fibrillation and is currently on Diltiazem and Metoprolol.  Discussed with the patient about the rationale behind pursuing cholecystostomy drains, but do understand where she's coming from and her frustrations.  Discussed with her that although doing gallbladder surgery would help her condition, our concern would be doing more harm during/with surgery due to her medical comorbidities.  However, I think it is reasonable to check with  her doctors if she would be cleared for surgery or not.  Reviewed with her the potential scenarios.  In the ideal scenario, she would be cleared for surgery and we could do her cholecystectomy in a couple of months.  Her clearance may depend on further workup which now having the drain in place would give [place] time to devote to further workup and optimization.  However, her doctors may also conclude that she is too high risk for surgery given all her comorbidities.  If that is the case, also discussed with the patient the possibility of having to keep the percutaneous cholecystostomy drain in place indefinitely rather than removing it so that this episode does not keep happening.  For now, continue her treatment plan in the hospital.  She can follow up with me in 2 weeks after discharge for further evaluation, discussion of clearances, scheduling cholangiogram, and any other concerns.  Patient understands this plan and all her questions have been answered.  Face-to-face time spent with the patient and care providers was 35 minutes, with more than 50% of the time spent counseling, educating, and coordinating care of the patient.     Olean Ree, MD

## 2021-01-28 DIAGNOSIS — K810 Acute cholecystitis: Secondary | ICD-10-CM | POA: Diagnosis not present

## 2021-01-28 LAB — GLUCOSE, CAPILLARY
Glucose-Capillary: 137 mg/dL — ABNORMAL HIGH (ref 70–99)
Glucose-Capillary: 164 mg/dL — ABNORMAL HIGH (ref 70–99)
Glucose-Capillary: 155 mg/dL — ABNORMAL HIGH (ref 70–99)
Glucose-Capillary: 154 mg/dL — ABNORMAL HIGH (ref 70–99)

## 2021-01-28 LAB — CBC WITH DIFFERENTIAL/PLATELET
Abs Immature Granulocytes: 0.37 10*3/uL — ABNORMAL HIGH (ref 0.00–0.07)
Basophils Absolute: 0.1 10*3/uL (ref 0.0–0.1)
Basophils Relative: 1 %
Eosinophils Absolute: 0.3 10*3/uL (ref 0.0–0.5)
Eosinophils Relative: 2 %
HCT: 33.4 % — ABNORMAL LOW (ref 36.0–46.0)
Hemoglobin: 11.1 g/dL — ABNORMAL LOW (ref 12.0–15.0)
Immature Granulocytes: 2 %
Lymphocytes Relative: 37 %
Lymphs Abs: 6.4 10*3/uL — ABNORMAL HIGH (ref 0.7–4.0)
MCH: 29.1 pg (ref 26.0–34.0)
MCHC: 33.2 g/dL (ref 30.0–36.0)
MCV: 87.4 fL (ref 80.0–100.0)
Monocytes Absolute: 1.3 10*3/uL — ABNORMAL HIGH (ref 0.1–1.0)
Monocytes Relative: 7 %
Neutro Abs: 9 10*3/uL — ABNORMAL HIGH (ref 1.7–7.7)
Neutrophils Relative %: 51 %
Platelets: 270 10*3/uL (ref 150–400)
RBC: 3.82 MIL/uL — ABNORMAL LOW (ref 3.87–5.11)
RDW: 15.2 % (ref 11.5–15.5)
Smear Review: NORMAL
WBC: 17.4 10*3/uL — ABNORMAL HIGH (ref 4.0–10.5)
nRBC: 0 % (ref 0.0–0.2)

## 2021-01-28 NOTE — Progress Notes (Signed)
Occupational Therapy Treatment Patient Details Name: Gina Chambers MRN: [id number] DOB: 1941-01-19 Today's Date: 01/28/2021   History of present illness 80 y.o. female with medical history significant of hypertension, diabetes mellitus, COPD, asthma, GERD, hypothyroidism, OSA on CPAP, DVT on Xarelto, dCHF, vertigo, GI bleeding, COVID-19 infection, cholecystitis, who presents with abdominal pain. S/p cholecystostomy tube on 9/26.   OT comments  Pt seen for OT treatment this date to f/u re: safety with ADLs/ADL mobility. Pt reporting fatigue this AM, but agreeable to session. Pt able to come to EOB Sitting with SUPV. She demos G static sitting balance. Pt demos good concentric control to come to standing and requires one cue for safe use of FWW. Pt able to complete fxl mobility x4 rounds with cues for pacing regarding HR. Pt with good understanding. Pt taken to restroom once back to room and able to transfer to commode with SUPV and one cue for use of grab bar. Pt returned to bed at end of session. All needs met and in reach, RN updated on session.    Recommendations for follow up therapy are one component of a multi-disciplinary discharge planning process, led by the attending physician.  Recommendations may be updated based on patient status, additional functional criteria and insurance authorization.    Follow Up Recommendations  Home health OT    Equipment Recommendations  3 in 1 bedside commode;Tub/shower seat;Other (comment) (2ww)    Recommendations for Other Services      Precautions / Restrictions Precautions Precautions: Fall Precaution Comments: monitor HR Restrictions Weight Bearing Restrictions: No       Mobility Bed Mobility Overal bed mobility: Needs Assistance Bed Mobility: Supine to Sit;Sit to Supine     Supine to sit: Supervision Sit to supine: Supervision        Transfers Overall transfer level: Modified independent Equipment used: Rolling walker (2  wheeled) Transfers: Sit to/from Stand Sit to Stand: Supervision;Modified independent (Device/Increase time)         General transfer comment: good concentric/eccentric control    Balance Overall balance assessment: Needs assistance Sitting-balance support: Feet supported Sitting balance-Leahy Scale: Good       Standing balance-Leahy Scale: Good Standing balance comment: G balance with UE support for fxl mobility                           ADL either performed or assessed with clinical judgement   ADL Overall ADL's : Needs assistance/impaired                     Lower Body Dressing: Set up;Sitting/lateral leans Lower Body Dressing Details (indicate cue type and reason): to don socks Toilet Transfer: Supervision/safety;Ambulation;RW;Grab bars           Functional mobility during ADLs: Supervision/safety;Rolling walker (4 laps around nursing unit)       Vision Baseline Vision/History: 1 Wears glasses Patient Visual Report: No change from baseline     Perception     Praxis      Cognition Arousal/Alertness: Awake/alert Behavior During Therapy: WFL for tasks assessed/performed Overall Cognitive Status: Within Functional Limits for tasks assessed                                          Exercises Other Exercises Other Exercises: OT engages pt in fxl mobility around unit with cues for  pacing d/t HR up to 135bpm wtih activity   Shoulder Instructions       General Comments      Pertinent Vitals/ Pain       Pain Assessment: No/denies pain  Home Living                                          Prior Functioning/Environment              Frequency  Min 2X/week        Progress Toward Goals  OT Goals(current goals can now be found in the care plan section)  Progress towards OT goals: Progressing toward goals  Acute Rehab OT Goals Patient Stated Goal: to walk again, build strength OT Goal  Formulation: With patient Time For Goal Achievement: 02/05/21 Potential to Achieve Goals: Good  Plan Frequency remains appropriate;Discharge plan remains appropriate    Co-evaluation                 AM-PAC OT "6 Clicks" Daily Activity     Outcome Measure   Help from another person eating meals?: None Help from another person taking care of personal grooming?: A Little Help from another person toileting, which includes using toliet, bedpan, or urinal?: A Little Help from another person bathing (including washing, rinsing, drying)?: A Little Help from another person to put on and taking off regular upper body clothing?: A Little Help from another person to put on and taking off regular lower body clothing?: A Little 6 Click Score: 19    End of Session Equipment Utilized During Treatment: Rolling walker  OT Visit Diagnosis: Muscle weakness (generalized) (M62.81)   Activity Tolerance Patient tolerated treatment well   Patient Left in bed;with call bell/phone within reach   Nurse Communication Mobility status;Other (comment) (hr)        Time: 4628-6381 OT Time Calculation (min): 48 min  Charges: OT General Charges $OT Visit: 1 Visit OT Treatments $Self Care/Home Management : 8-22 mins $Therapeutic Activity: 23-37 mins  Gerrianne Scale, MS, OTR/L ascom [phone] 01/28/21, 3:59 PM

## 2021-01-28 NOTE — TOC Progression Note (Addendum)
Transition of Care [facility]) - Progression Note    Patient Details  Name: Gina Chambers MRN: [id number] Date of Birth: [birth year]  Transition of [facility] CM/SW Contact  Izola Price, RN Phone Number: 01/28/2021, 1:02 PM  Clinical Narrative:  01/28/21:  New PT/OT HH rec's.  Spoke with patient and daughter. No preferences for [facility] agencies. Reached out to [facility]/ [facility] for [facility] ins. Patient has shower/tub seat, refused 3:1, has a rollator that was given to her so ordered a RW via [employer]. Chol. Drain will remain in place for 6-8 more weeks per patient. Daughter can transport patient to appointments and no medication issues. Patient lives with daughter and according to daughter was fairly independent PTA and was able to ambulate down a long hallway for 30 minutes with rollator.  * Daughter did express concerns about [facility] services being enough as husband has broken foot and works out of the home and she is physically unable to help, and is unable to assist with bathing issues. Would like an aide for those issues. May need RN to assess drain, will check to provider discharge orders, but will relay these concerns.  * Daughter also has concerns on discharge with transportation/getting patient safely in house. Driveway is angled/sloped with curved sidewalk leading up to porch with one step into house, the long hallway to patient's bedroom/bathroom in back of the daughter's house.   RN CM informed daughter and patient that TOC would be back in touch when things were finalized. Simmie Davies RN CM    6384: [facility] again and Angie said they are not able to accept this patient due to staffing in [place]. Will continue to secure agency. Expected DC tomorrow. Simmie Davies RN CM     Expected Discharge Plan and Services                                                 Social Determinants of Health (SDOH) Interventions    Readmission Risk Interventions No flowsheet data found.

## 2021-01-28 NOTE — Progress Notes (Signed)
PROGRESS NOTE  Gina Chambers XFG:[id number] DOB: 24-Feb-1941 DOA: 01/15/2021 PCP: Kirk Ruths, MD  Brief History   80 y.o. female with medical history significant of hypertension, diabetes mellitus, COPD, asthma, GERD, hypothyroidism, OSA on CPAP, DVT on Xarelto, dCHF, vertigo, GI bleeding, COVID-19 infection, cholecystitis admitted for worsening abdominal pain.  She was diagnosed with acute cholecystitis with cholelithiasis.  Cholecystostomy tube was placed on 9/26, was on IV Abx and switched to PO Augmentin on 10/3   Patient developed atrial flutter with rapid ventricular response 9/30, seen by cardiology, given diltiazem, Toprol and digoxin. Heart rate is much better controlled. Cardiology has recommended cardiac rehab. No further cardiac testing is anticipated.  The patient has decided that she is not interested in hospice. Plan is for her to discharge to home with home health services. Dr. Allyne Gee has spoken with the patient. Once the drain is out/has been in for a minimum of 6-8 weeks, she will follow up with him as outpatient and consideration will be given to cholecystectomy in the future.   Plan is for patient to discharge to home tomorrow.  Consultants  Cardiology Palliative Care General surgery Interventional radiology  Procedures  Cholecystostomy tube placement 01/16/2021  Antibiotics   Anti-infectives (From admission, onward)    Start     Dose/Rate Route Frequency Ordered Stop   01/23/21 2200  amoxicillin-clavulanate (AUGMENTIN) 875-125 MG per tablet 1 tablet        1 tablet Oral Every 12 hours 01/23/21 1146 01/30/21 0959   01/20/21 1600  Ampicillin-Sulbactam (UNASYN) 3 g in sodium chloride 0.9 % 100 mL IVPB        3 g 200 mL/hr over 30 Minutes Intravenous Every 6 hours 01/20/21 1457 01/23/21 1706   01/15/21 2200  piperacillin-tazobactam (ZOSYN) IVPB 3.375 g  Status:  Discontinued        3.375 g 12.5 mL/hr over 240 Minutes Intravenous Every 8 hours 01/15/21 1626  01/20/21 1457   01/15/21 1545  piperacillin-tazobactam (ZOSYN) IVPB 3.375 g        3.375 g 100 mL/hr over 30 Minutes Intravenous  Once 01/15/21 1543 01/15/21 1715      Subjective  The patient is resting comfortably. She is in a better frame of mind today. No new complaints.   Objective   Vitals:  Vitals:   01/28/21 1000 01/28/21 1152  BP: (!) 125/45 (!) 122/58  Pulse: 64 77  Resp:    Temp: 97.7 F (36.5 C) 97.8 F (36.6 C)  SpO2:  98%    Exam:  Constitutional:  Awake, alert, and oriented x 3. No acute distress. Respiratory:  CTA bilaterally, no w/r/r.  Respiratory effort normal. No retractions or accessory muscle use Cardiovascular:  RRR, no m/r/g No LE extremity edema   Normal pedal pulses Abdomen:  Abdomen appears normal; no tenderness or masses No hernias No HSM Musculoskeletal:  Digits/nails BUE: no clubbing, cyanosis, petechiae, infection exam of joints, bones, muscles of at least one of following: head/neck, RUE, LUE, RLE, LLE   Skin:  No rashes, lesions, ulcers palpation of skin: no induration or nodules Neurologic:  CN 2-12 intact Sensation all 4 extremities intact Psychiatric:  Mental status: Depressed Mood, affect appropriate Orientation to person, place, time   I have personally reviewed the following:   Today's Data  Vitals  Lab Data  CBC   Imaging  CT abdomen and pelvis  Cardiology Data  EKG Echocardiogram  Scheduled Meds:  amoxicillin-clavulanate  1 tablet Oral Q12H   aspirin EC  81 mg Oral QHS   diltiazem  180 mg Oral BID   fluticasone  2 puff Inhalation BID   gabapentin  100 mg Oral QID   insulin aspart  0-5 Units Subcutaneous QHS   insulin aspart  0-9 Units Subcutaneous TID WC   insulin glargine-yfgn  10 Units Subcutaneous Daily   And   insulin glargine-yfgn  5 Units Subcutaneous QHS   levothyroxine  125 mcg Oral Q0600   metoprolol succinate  50 mg Oral Daily   montelukast  10 mg Oral QHS   multivitamin with minerals    Oral Daily   pantoprazole  20 mg Oral Daily   rivaroxaban  20 mg Oral Q supper   sodium chloride flush  5 mL Intracatheter Q8H   spironolactone  25 mg Oral Daily   torsemide  40 mg Oral BID   Continuous Infusions:  sodium chloride      Principal Problem:   Acute cholecystitis Active Problems:   Leukocytosis   Generalized weakness   Acquired hypothyroidism   Essential hypertension   Type 2 diabetes mellitus without complication (HCC)   Hyponatremia   COPD (chronic obstructive pulmonary disease) (HCC)   DVT (deep venous thrombosis) (HCC)   OSA on CPAP   Chronic diastolic CHF (congestive heart failure) (HCC)   Sepsis (HCC)   Right shoulder pain   Paroxysmal atrial flutter ([place])   LOS: 13 days   [facility] Problems * Acute cholecystitis S/p IR guided cholecystostomy tube on 9/26.  Tube will need to stay in place for 6 to 8 weeks per surgery.  On oral Augmentin. WBC decreasing. Dr Allyne Gee discussed cholecystectomy with the patient in 6-8 weeks when her tube is removed, she will see him as outpatient and be evaluated for possible cholecystectomy.   Paroxysmal atrial flutter (HCC) Patient's heart rate at rest in 110s to 130s.  Cardiology following.  continue Cardizem CD 180 mg twice a day.  Continue metoprolol (increased to 50 mg today) and Xarelto. Cardiology has recommended cardiac rehab for patient upon discharge.    Acquired hypothyroidism Continue Synthroid   Generalized weakness Improving. Multifactorial.  PT and OT recommend Home with home health now.  TOC aware and working on arrangements.   Leukocytosis Decreasing. Monitor.    I have seen and examined this patient myself. I have spent 2 minutes in her evaluation and care.   Jodilyn Giese, DO [facility] Direct contact: see [email or web address]  7PM-7AM contact night coverage as above 01/28/2021, 2:51 PM  LOS: 10 days

## 2021-01-28 NOTE — Progress Notes (Signed)
PT Cancellation Note  Patient Details Name: Gina Chambers MRN: [id number] DOB: [birth year]   Cancelled Treatment:    Reason Eval/Treat Not Completed: Fatigue/lethargy limiting ability to participate. Patient states she did not sleep all night. States she walked 4 x around unit already. She wants to get some rest. Will continue to follow.     Graysen [place]yard 01/28/2021, 1:31 PM

## 2021-01-28 NOTE — Progress Notes (Signed)
Cpap declined by pt

## 2021-01-29 DIAGNOSIS — K810 Acute cholecystitis: Secondary | ICD-10-CM | POA: Diagnosis not present

## 2021-01-29 LAB — GLUCOSE, CAPILLARY
Glucose-Capillary: 128 mg/dL — ABNORMAL HIGH (ref 70–99)
Glucose-Capillary: 167 mg/dL — ABNORMAL HIGH (ref 70–99)
Glucose-Capillary: 149 mg/dL — ABNORMAL HIGH (ref 70–99)
Glucose-Capillary: 207 mg/dL — ABNORMAL HIGH (ref 70–99)

## 2021-01-29 LAB — CBC WITH DIFFERENTIAL/PLATELET
Abs Immature Granulocytes: 0.27 10*3/uL — ABNORMAL HIGH (ref 0.00–0.07)
Basophils Absolute: 0.1 10*3/uL (ref 0.0–0.1)
Basophils Relative: 1 %
Eosinophils Absolute: 0.3 10*3/uL (ref 0.0–0.5)
Eosinophils Relative: 2 %
HCT: 32.7 % — ABNORMAL LOW (ref 36.0–46.0)
Hemoglobin: 11 g/dL — ABNORMAL LOW (ref 12.0–15.0)
Immature Granulocytes: 2 %
Lymphocytes Relative: 41 %
Lymphs Abs: 6.5 10*3/uL — ABNORMAL HIGH (ref 0.7–4.0)
MCH: 29.3 pg (ref 26.0–34.0)
MCHC: 33.6 g/dL (ref 30.0–36.0)
MCV: 87.2 fL (ref 80.0–100.0)
Monocytes Absolute: 1.2 10*3/uL — ABNORMAL HIGH (ref 0.1–1.0)
Monocytes Relative: 8 %
Neutro Abs: 7.6 10*3/uL (ref 1.7–7.7)
Neutrophils Relative %: 46 %
Platelets: 284 10*3/uL (ref 150–400)
RBC: 3.75 MIL/uL — ABNORMAL LOW (ref 3.87–5.11)
RDW: 15 % (ref 11.5–15.5)
WBC: 15.9 10*3/uL — ABNORMAL HIGH (ref 4.0–10.5)
nRBC: 0 % (ref 0.0–0.2)

## 2021-01-29 LAB — BASIC METABOLIC PANEL
Anion gap: 10 (ref 5–15)
BUN: 22 mg/dL (ref 8–23)
CO2: 29 mmol/L (ref 22–32)
Calcium: 8.8 mg/dL — ABNORMAL LOW (ref 8.9–10.3)
Chloride: 96 mmol/L — ABNORMAL LOW (ref 98–111)
Creatinine, Ser: 0.89 mg/dL (ref 0.44–1.00)
GFR, Estimated: >60 mL/min (ref >60)
Glucose, Bld: 139 mg/dL — ABNORMAL HIGH (ref 70–99)
Potassium: 3.8 mmol/L (ref 3.5–5.1)
Sodium: 135 mmol/L (ref 135–145)

## 2021-01-29 NOTE — Progress Notes (Signed)
Chaplain responded to request by RN to visit pt.  Pt initially stated that she was told she had a terminal illness and was going to die. She spoke dispassionately about this, said she wasn't afraid of dying due to her faith.  However, pt became teary at the thought of not being able to participate in her church's [employer] ministry. Pt is Gina Chambers and deeply committed to her faith.  Chaplain engaged pt in story-telling; pt talked about her family and her church ministries.  Pt has one daughter and three grandchildren.    Chaplain prayed with pt.  Pt requested a second visit with chaplain.  Will f/u this afternoon.  Please contact if support is needed sooner.    Minus Liberty, [place], [place] Pager:  [phone]   01/29/21 1310  Clinical Encounter Type  Visited With Patient  Visit Type Initial;Spiritual support  Referral From Nurse  Consult/Referral To Chaplain  Spiritual Encounters  Spiritual Needs Prayer  Stress Factors  Patient Stress Factors Loss of control;Major life changes

## 2021-01-29 NOTE — Progress Notes (Signed)
PROGRESS NOTE  Gina Chambers INO:[id number] DOB: May 14, 1940 DOA: 01/15/2021 PCP: Kirk Ruths, MD  Brief History   80 y.o. female with medical history significant of hypertension, diabetes mellitus, COPD, asthma, GERD, hypothyroidism, OSA on CPAP, DVT on Xarelto, dCHF, vertigo, GI bleeding, COVID-19 infection, cholecystitis admitted for worsening abdominal pain.  She was diagnosed with acute cholecystitis with cholelithiasis.  Cholecystostomy tube was placed on 9/26, was on IV Abx and switched to PO Augmentin on 10/3   Patient developed atrial flutter with rapid ventricular response 9/30, seen by cardiology, given diltiazem, Toprol and digoxin. Heart rate is much better controlled. Cardiology has recommended cardiac rehab. No further cardiac testing is anticipated.  The patient has decided that she is not interested in hospice. Plan is for her to discharge to home with home health services. Dr. Allyne Gee has spoken with the patient. Once the drain is out/has been in for a minimum of 6-8 weeks, she will follow up with him as outpatient and consideration will be given to cholecystectomy in the future.   Plan is for patient to discharge to home tomorrow.  Consultants  Cardiology Palliative Care General surgery Interventional radiology  Procedures  Cholecystostomy tube placement 01/16/2021  Antibiotics   Anti-infectives (From admission, onward)    Start     Dose/Rate Route Frequency Ordered Stop   01/23/21 2200  amoxicillin-clavulanate (AUGMENTIN) 875-125 MG per tablet 1 tablet        1 tablet Oral Every 12 hours 01/23/21 1146 01/30/21 0959   01/20/21 1600  Ampicillin-Sulbactam (UNASYN) 3 g in sodium chloride 0.9 % 100 mL IVPB        3 g 200 mL/hr over 30 Minutes Intravenous Every 6 hours 01/20/21 1457 01/23/21 1706   01/15/21 2200  piperacillin-tazobactam (ZOSYN) IVPB 3.375 g  Status:  Discontinued        3.375 g 12.5 mL/hr over 240 Minutes Intravenous Every 8 hours 01/15/21 1626  01/20/21 1457   01/15/21 1545  piperacillin-tazobactam (ZOSYN) IVPB 3.375 g        3.375 g 100 mL/hr over 30 Minutes Intravenous  Once 01/15/21 1543 01/15/21 1715      Subjective  The patient is resting comfortably. She is in a better frame of mind today. No new complaints.   Objective   Vitals:  Vitals:   01/29/21 0720 01/29/21 1151  BP: (!) 106/59 110/85  Pulse: 77 (!) 108  Resp: 18 18  Temp: 98.5 F (36.9 C) 98.2 F (36.8 C)  SpO2: 100% 97%    Exam:  Constitutional:  Awake, alert, and oriented x 3. No acute distress. Respiratory:  CTA bilaterally, no w/r/r.  Respiratory effort normal. No retractions or accessory muscle use Cardiovascular:  RRR, no m/r/g No LE extremity edema   Normal pedal pulses Abdomen:  Abdomen appears normal; no tenderness or masses No hernias No HSM Musculoskeletal:  Digits/nails BUE: no clubbing, cyanosis, petechiae, infection exam of joints, bones, muscles of at least one of following: head/neck, RUE, LUE, RLE, LLE   Skin:  No rashes, lesions, ulcers palpation of skin: no induration or nodules Neurologic:  CN 2-12 intact Sensation all 4 extremities intact Psychiatric:  Mental status: Depressed Mood, affect appropriate Orientation to person, place, time   I have personally reviewed the following:   Today's Data  Vitals  Lab Data  CBC   Imaging  CT abdomen and pelvis  Cardiology Data  EKG Echocardiogram  Scheduled Meds:  amoxicillin-clavulanate  1 tablet Oral Q12H   aspirin EC  81 mg Oral QHS   diltiazem  180 mg Oral BID   fluticasone  2 puff Inhalation BID   gabapentin  100 mg Oral QID   insulin aspart  0-5 Units Subcutaneous QHS   insulin aspart  0-9 Units Subcutaneous TID WC   insulin glargine-yfgn  10 Units Subcutaneous Daily   And   insulin glargine-yfgn  5 Units Subcutaneous QHS   levothyroxine  125 mcg Oral Q0600   metoprolol succinate  50 mg Oral Daily   montelukast  10 mg Oral QHS   multivitamin with  minerals   Oral Daily   pantoprazole  20 mg Oral Daily   rivaroxaban  20 mg Oral Q supper   sodium chloride flush  5 mL Intracatheter Q8H   spironolactone  25 mg Oral Daily   torsemide  40 mg Oral BID   Continuous Infusions:  sodium chloride      Principal Problem:   Acute cholecystitis Active Problems:   Leukocytosis   Generalized weakness   Acquired hypothyroidism   Essential hypertension   Type 2 diabetes mellitus without complication (HCC)   Hyponatremia   COPD (chronic obstructive pulmonary disease) (HCC)   DVT (deep venous thrombosis) (HCC)   OSA on CPAP   Chronic diastolic CHF (congestive heart failure) (HCC)   Sepsis (HCC)   Right shoulder pain   Paroxysmal atrial flutter ([place])   LOS: 14 days   [facility] Problems * Acute cholecystitis S/p IR guided cholecystostomy tube on 9/26.  Tube will need to stay in place for 6 to 8 weeks per surgery.  On oral Augmentin. WBC decreasing. Dr Allyne Gee discussed cholecystectomy with the patient in 6-8 weeks when her tube is removed, she will see him as outpatient and be evaluated for possible cholecystectomy.   Paroxysmal atrial flutter (HCC) Patient's heart rate at rest mostly between 40/s to 80's. With any exertion, however it increases to the 120's-130's. Likely due to deconditioning.  Cardiology following. Continue Cardizem CD 180 mg twice a day.  Blood pressure will not likely tolerate a higher dose. Continue metoprolol (increased to 50 mg today) and Xarelto. Cardiology has recommended cardiac rehab for patient upon discharge.    Acquired hypothyroidism Continue Synthroid   Generalized weakness Improving. Multifactorial.  PT and OT recommend Home with home health now.  TOC aware and working on arrangements.   Leukocytosis Decreasing. Monitor.    I have seen and examined this patient myself. I have spent 34 minutes in her evaluation and care.   Athol Bolds, DO [facility] Direct contact: see [email or web address]   7PM-7AM contact night coverage as above 01/29/2021, 4:01 PM  LOS: 10 days

## 2021-01-29 NOTE — Progress Notes (Signed)
Physical Therapy Treatment Patient Details Name: Gina Chambers MRN: [id number] DOB: 11-11-1940 Today's Date: 01/29/2021   History of Present Illness 80 y.o. female with medical history significant of hypertension, diabetes mellitus, COPD, asthma, GERD, hypothyroidism, OSA on CPAP, DVT on Xarelto, dCHF, vertigo, GI bleeding, COVID-19 infection, cholecystitis, who presents with abdominal pain. S/p cholecystostomy tube on 9/26.    PT Comments    Pt received in bed but easily awakened & reluctantly agreeable to tx. Pt is able to complete bed mobility with mod I & ambulate in room & bathroom to toilet with mod I. Pt does attempt to ambulate short distance (~7 ft) without AD but reaches for counter for support with supervision - anticipate & educated pt on need to focus tx on gait without AD as she did not use one prior to admission. Pt returns to sitting on EOB & voices frustration re: situation & conflicting information re: prognosis with PT providing therapeutic listening & encouragement. Pt noted to have sustained HR of 135-137 while sitting EOB; HR does not decrease even with prolonged rest break (~7 minutes) so further gait deferred & nurse notified. Pt would benefit from ongoing acute PT services to address gait & balance without AD to help facilitate return to PLOF.    Recommendations for follow up therapy are one component of a multi-disciplinary discharge planning process, led by the attending physician.  Recommendations may be updated based on patient status, additional functional criteria and insurance authorization.  Follow Up Recommendations  Home health PT;Supervision for mobility/OOB     Equipment Recommendations  Rolling walker with 5" wheels    Recommendations for Other Services       Precautions / Restrictions Precautions Precautions: Fall Precaution Comments: monitor HR Restrictions Weight Bearing Restrictions: No     Mobility  Bed Mobility Overal bed mobility:  Modified Independent       Supine to sit: Modified independent (Device/Increase time);HOB elevated          Transfers Overall transfer level: Modified independent Equipment used: Rolling walker (2 wheeled) Transfers: Sit to/from Stand Sit to Stand: Modified independent (Device/Increase time)         General transfer comment: with RW  Ambulation/Gait Ambulation/Gait assistance: Modified independent (Device/Increase time) Gait Distance (Feet): 20 Feet (+ 20 ft) Assistive device: Rolling walker (2 wheeled) Gait Pattern/deviations: Decreased stride length Gait velocity: decreased       Stairs             Wheelchair Mobility    Modified Rankin (Stroke Patients Only)       Balance Overall balance assessment: Needs assistance   Sitting balance-Leahy Scale: Normal     Standing balance support: No upper extremity supported;During functional activity Standing balance-Leahy Scale: Fair                              Cognition Arousal/Alertness: Awake/alert Behavior During Therapy: WFL for tasks assessed/performed Overall Cognitive Status: Within Functional Limits for tasks assessed                                        Exercises      General Comments General comments (skin integrity, edema, etc.): Pt uses restroom & performs hand hygiene without assistance, demonstrates good awareness of managing tubing/drain.      Pertinent Vitals/Pain Pain Assessment: No/denies pain  Home Living                      Prior Function            PT Goals (current goals can now be found in the care plan section) Acute Rehab PT Goals Patient Stated Goal: to walk again, build strength PT Goal Formulation: With patient Time For Goal Achievement: 02/02/21 Potential to Achieve Goals: Good Progress towards PT goals: Progressing toward goals    Frequency    Min 2X/week      PT Plan Current plan remains appropriate     Co-evaluation              AM-PAC PT "6 Clicks" Mobility   Outcome Measure  Help needed turning from your back to your side while in a flat bed without using bedrails?: None Help needed moving from lying on your back to sitting on the side of a flat bed without using bedrails?: None Help needed moving to and from a bed to a chair (including a wheelchair)?: None Help needed standing up from a chair using your arms (e.g., wheelchair or bedside chair)?: None Help needed to walk in hospital room?: A Little Help needed climbing 3-5 steps with a railing? : A Little 6 Click Score: 22    End of Session   Activity Tolerance: Treatment limited secondary to medical complications (Comment) (limited by elevated HR) Patient left: in bed;with bed alarm set;with call bell/phone within reach Nurse Communication: Mobility status (HR, wishes to see chaplain) PT Visit Diagnosis: Muscle weakness (generalized) (M62.81)     Time: 2876-8115 PT Time Calculation (min) (ACUTE ONLY): 26 min  Charges:  $Therapeutic Activity: 23-37 mins                     Lavone Nian, PT, DPT 01/29/21, 2:06 PM    Waunita Schooner 01/29/2021, 2:04 PM

## 2021-01-29 NOTE — TOC Progression Note (Signed)
Transition of Care [facility]) - Progression Note    Patient Details  Name: Gina Chambers MRN: [id number] Date of Birth: 05/12/1940  [facility] CM/SW Contact  Izola Price, RN Phone Number: 01/29/2021, 4:08 PM  Clinical Narrative: Unable to secure [facility] services today. [facility] will try to accept tomorrow per Ramond Marrow. Need to confirm. Simmie Davies RN CM            Expected Discharge Plan and Services                                                 Social Determinants of Health (SDOH) Interventions    Readmission Risk Interventions No flowsheet data found.

## 2021-01-30 DIAGNOSIS — K810 Acute cholecystitis: Secondary | ICD-10-CM | POA: Diagnosis not present

## 2021-01-30 DIAGNOSIS — Z7189 Other specified counseling: Secondary | ICD-10-CM | POA: Diagnosis not present

## 2021-01-30 LAB — CBC WITH DIFFERENTIAL/PLATELET
Abs Immature Granulocytes: 0.18 10*3/uL — ABNORMAL HIGH (ref 0.00–0.07)
Basophils Absolute: 0.1 10*3/uL (ref 0.0–0.1)
Basophils Relative: 1 %
Eosinophils Absolute: 0.2 10*3/uL (ref 0.0–0.5)
Eosinophils Relative: 2 %
HCT: 34.1 % — ABNORMAL LOW (ref 36.0–46.0)
Hemoglobin: 11.4 g/dL — ABNORMAL LOW (ref 12.0–15.0)
Immature Granulocytes: 1 %
Lymphocytes Relative: 36 %
Lymphs Abs: 4.6 10*3/uL — ABNORMAL HIGH (ref 0.7–4.0)
MCH: 29.5 pg (ref 26.0–34.0)
MCHC: 33.4 g/dL (ref 30.0–36.0)
MCV: 88.1 fL (ref 80.0–100.0)
Monocytes Absolute: 1.1 10*3/uL — ABNORMAL HIGH (ref 0.1–1.0)
Monocytes Relative: 9 %
Neutro Abs: 6.6 10*3/uL (ref 1.7–7.7)
Neutrophils Relative %: 51 %
Platelets: 260 10*3/uL (ref 150–400)
RBC: 3.87 MIL/uL (ref 3.87–5.11)
RDW: 15 % (ref 11.5–15.5)
Smear Review: NORMAL
WBC: 12.9 10*3/uL — ABNORMAL HIGH (ref 4.0–10.5)
nRBC: 0 % (ref 0.0–0.2)

## 2021-01-30 LAB — GLUCOSE, CAPILLARY
Glucose-Capillary: 135 mg/dL — ABNORMAL HIGH (ref 70–99)
Glucose-Capillary: 160 mg/dL — ABNORMAL HIGH (ref 70–99)
Glucose-Capillary: 138 mg/dL — ABNORMAL HIGH (ref 70–99)
Glucose-Capillary: 182 mg/dL — ABNORMAL HIGH (ref 70–99)

## 2021-01-30 MED ORDER — GABAPENTIN 100 MG PO CAPS
200.0000 mg | ORAL_CAPSULE | Freq: Once | ORAL | Status: AC
  Administered 2021-01-30: 200 mg via ORAL
  Filled 2021-01-30: qty 2

## 2021-01-30 NOTE — Care Management Important Message (Signed)
Important Message  Patient Details  Name: Gina Chambers MRN: [id number] Date of Birth: 1941/02/09   Medicare Important Message Given:  Yes     Dannette Barbara 01/30/2021, 4:27 PM

## 2021-01-30 NOTE — Progress Notes (Signed)
[facility] Liaison note:  Follow to hospice referral from 10/7. Plan at this time is for discharge to SNF with outpatient Palliative to follow. Referral updated. Thank you.  Flo Shanks BSN, RN, [place] [phone]

## 2021-01-30 NOTE — Progress Notes (Signed)
Occupational Therapy Treatment Patient Details Name: Gina Chambers MRN: [id number] DOB: [birth year] Today's Date: 01/30/2021   History of present illness 80 y.o. female with medical history significant of hypertension, diabetes mellitus, COPD, asthma, GERD, hypothyroidism, OSA on CPAP, DVT on Xarelto, dCHF, vertigo, GI bleeding, COVID-19 infection, cholecystitis, who presents with abdominal pain. S/p cholecystostomy tube on 9/26.   OT comments  Pt seen for OT tx this date. Family present at start of session and then left to allow pt time to focus on therapy. Pt performed bed mobility and ADL transfers with modified independent, supervision for safety and PRN VC For safety with ADL transfers/RW mgt as pt had tendency to push the RW to the side prior to sitting on the toilet and prior to return to EOB. HR stable, 84-94bpm with exertion. Pt instructed in ECS to support ADL/IADL participation and safety. Pt shares her love of painting and OT provided strategies to support her in engaging in meaningful activities while avoiding over exertion. Pt verbalized understanding. Handout provided to support recall and carryover. Pt continues to benefit from skilled OT services. Continue to recommend [facility] services to continue to address noted impairments.     Recommendations for follow up therapy are one component of a multi-disciplinary discharge planning process, led by the attending physician.  Recommendations may be updated based on patient status, additional functional criteria and insurance authorization.    Follow Up Recommendations  Home health OT    Equipment Recommendations  3 in 1 bedside commode;Tub/shower seat;Other (comment) (2WW, reacher, LH sponge)    Recommendations for Other Services      Precautions / Restrictions Precautions Precautions: Fall Precaution Comments: monitor HR Restrictions Weight Bearing Restrictions: No       Mobility Bed Mobility Overal bed mobility: Modified  Independent Bed Mobility: Supine to Sit;Sit to Supine     Supine to sit: Modified independent (Device/Increase time);HOB elevated Sit to supine: Modified independent (Device/Increase time)        Transfers Overall transfer level: Modified independent Equipment used: Rolling walker (2 wheeled);None Transfers: Sit to/from Stand Sit to Stand: Modified independent (Device/Increase time)         General transfer comment: Pt refused RW initially, standing without AD but no LOB, improved safety with RW    Balance Overall balance assessment: Needs assistance Sitting-balance support: Feet supported Sitting balance-Leahy Scale: Normal     Standing balance support: No upper extremity supported;During functional activity Standing balance-Leahy Scale: Fair                             ADL either performed or assessed with clinical judgement   ADL Overall ADL's : Needs assistance/impaired     Grooming: Supervision/safety;Wash/dry hands;Standing                   Armed forces technical officer: Supervision/safety;Ambulation;RW;Grab bars Toilet Transfer Details (indicate cue type and reason): R grab bar, cue for safety after pushing the RW to the side Toileting- Clothing Manipulation and Hygiene: Supervision/safety;Sitting/lateral lean Toileting - Clothing Manipulation Details (indicate cue type and reason): remote supervision     Functional mobility during ADLs: Supervision/safety;Rolling walker (cues to use RW and RW safety as she again pushes RW aside as she gets close to where she intends to sit)       Vision       Perception     Praxis      Cognition Arousal/Alertness: Awake/alert Behavior During Therapy: [facility] for  tasks assessed/performed Overall Cognitive Status: Within Functional Limits for tasks assessed                                 General Comments: Intermittent VC for safety/RW mgt as she pushes the RW to the side prior to being close to a  seated surface and reaches out for furniture/walls for stability instead.        Exercises Other Exercises Other Exercises: Pt instructed in energy conservation strategies to support her safe participation in "must do" and "want to do" activities at home while minimizing risk of over exertion and falls. Handout provided to support recall and carryover.   Shoulder Instructions       General Comments      Pertinent Vitals/ Pain       Pain Assessment: Faces Faces Pain Scale: Hurts a little bit Pain Location: R side/drain site Pain Descriptors / Indicators: Sore Pain Intervention(s): Limited activity within patient's tolerance;Monitored during session;Repositioned  Home Living                                          Prior Functioning/Environment              Frequency  Min 2X/week        Progress Toward Goals  OT Goals(current goals can now be found in the care plan section)  Progress towards OT goals: Progressing toward goals  Acute Rehab OT Goals Patient Stated Goal: to walk again, build strength OT Goal Formulation: With patient Time For Goal Achievement: 02/05/21 Potential to Achieve Goals: Good  Plan Frequency remains appropriate;Discharge plan remains appropriate    Co-evaluation                 AM-PAC OT "6 Clicks" Daily Activity     Outcome Measure   Help from another person eating meals?: None Help from another person taking care of personal grooming?: None Help from another person toileting, which includes using toliet, bedpan, or urinal?: A Little Help from another person bathing (including washing, rinsing, drying)?: A Little Help from another person to put on and taking off regular upper body clothing?: None Help from another person to put on and taking off regular lower body clothing?: A Little 6 Click Score: 21    End of Session Equipment Utilized During Treatment: Rolling walker  OT Visit Diagnosis: Muscle weakness  (generalized) (M62.81) Pain - Right/Left: Right Pain - part of body:  (abdomen)   Activity Tolerance Patient tolerated treatment well   Patient Left in bed;with call bell/phone within reach;Other (comment) (with PT)   Nurse Communication Mobility status;Other (comment) (HR)        Time: 1540-1610 OT Time Calculation (min): 30 min  Charges: OT General Charges $OT Visit: 1 Visit OT Treatments $Self Care/Home Management : 23-37 mins  Ardeth Perfect., MPH, MS, OTR/L ascom [phone] 01/30/21, 4:22 PM

## 2021-01-30 NOTE — Progress Notes (Signed)
Physical Therapy Treatment Patient Details Name: Gina Chambers MRN: [id number] DOB: [birth year] Today's Date: 01/30/2021   History of Present Illness 80 y.o. female with medical history significant of hypertension, diabetes mellitus, COPD, asthma, GERD, hypothyroidism, OSA on CPAP, DVT on Xarelto, dCHF, vertigo, GI bleeding, COVID-19 infection, cholecystitis, who presents with abdominal pain. S/p cholecystostomy tube on 9/26.    PT Comments    Pt seen for PT tx with no family present. Pt agreeable to intervention & able to ambulate around nurses station 4x with RW & mod I with improved HR compared to yesterday (85-106 bpm). PT did speak with MD prior to session & MD reports pt's HR will be elevated until pt becomes more conditioned & pt educated on this. PT encouraged pt to ambulate without AD for balance training & to help facilitate return to PLOF but pt declines at this time. Educated pt on recommendation of HHPT f/u. Will continue to follow pt acutely to address high level balance training & gait without AD.     Recommendations for follow up therapy are one component of a multi-disciplinary discharge planning process, led by the attending physician.  Recommendations may be updated based on patient status, additional functional criteria and insurance authorization.  Follow Up Recommendations  Home health PT;Supervision - Intermittent     Equipment Recommendations  Rolling walker with 5" wheels    Recommendations for Other Services       Precautions / Restrictions Precautions Precautions: Fall Precaution Comments: monitor HR Restrictions Weight Bearing Restrictions: No     Mobility  Bed Mobility Overal bed mobility: Modified Independent Bed Mobility: Supine to Sit;Sit to Supine     Supine to sit: Modified independent (Device/Increase time);HOB elevated Sit to supine: Modified independent (Device/Increase time)   General bed mobility comments: Pt received & left sitting on  EOB.    Transfers Overall transfer level: Modified independent Equipment used: None Transfers: Sit to/from Stand Sit to Stand: Modified independent (Device/Increase time)         General transfer comment: Pt refused RW initially, standing without AD but no LOB, improved safety with RW  Ambulation/Gait Ambulation/Gait assistance: Modified independent (Device/Increase time) Gait Distance (Feet): 700 Feet Assistive device: Rolling walker (2 wheeled)       General Gait Details: Pt ambulates 4 laps around nurses station with good gait speed & no overt LOB with RW & mod I. Encouraged pt to attempt gait without AD for high level balance training & to progress to PLOF but pt declines at this time. Pt does ambulate 3 ft x 2 in room from EOB to RW with supervision but elects to hold to counter vs being educated on side stepping with RW.   Stairs             Wheelchair Mobility    Modified Rankin (Stroke Patients Only)       Balance Overall balance assessment: Needs assistance Sitting-balance support: Feet supported Sitting balance-Leahy Scale: Normal     Standing balance support: During functional activity;Bilateral upper extremity supported Standing balance-Leahy Scale: Good                              Cognition Arousal/Alertness: Awake/alert Behavior During Therapy: Flat affect;WFL for tasks assessed/performed Overall Cognitive Status: Within Functional Limits for tasks assessed  General Comments: Intermittent VC for safety/RW mgt as she pushes the RW to the side prior to being close to a seated surface and reaches out for furniture/walls for stability instead.      Exercises Other Exercises Other Exercises: Pt instructed in energy conservation strategies to support her safe participation in "must do" and "want to do" activities at home while minimizing risk of over exertion and falls. Handout provided to  support recall and carryover.    General Comments General comments (skin integrity, edema, etc.): HR 85-106 bpm      Pertinent Vitals/Pain Pain Assessment: No/denies pain Faces Pain Scale: Hurts a little bit Pain Location: R side/drain site Pain Descriptors / Indicators: Sore Pain Intervention(s): Limited activity within patient's tolerance;Monitored during session;Repositioned    Home Living                      Prior Function            PT Goals (current goals can now be found in the care plan section) Acute Rehab PT Goals Patient Stated Goal: to walk again, build strength PT Goal Formulation: With patient Time For Goal Achievement: 02/02/21 Potential to Achieve Goals: Good Progress towards PT goals: Progressing toward goals    Frequency    Min 2X/week      PT Plan Current plan remains appropriate    Co-evaluation              AM-PAC PT "6 Clicks" Mobility   Outcome Measure  Help needed turning from your back to your side while in a flat bed without using bedrails?: None Help needed moving from lying on your back to sitting on the side of a flat bed without using bedrails?: None Help needed moving to and from a bed to a chair (including a wheelchair)?: None Help needed standing up from a chair using your arms (e.g., wheelchair or bedside chair)?: None Help needed to walk in hospital room?: None Help needed climbing 3-5 steps with a railing? : A Little 6 Click Score: 23    End of Session   Activity Tolerance: Patient tolerated treatment well Patient left: with call bell/phone within reach (sitting EOB) Nurse Communication: Mobility status PT Visit Diagnosis: Muscle weakness (generalized) (M62.81)     Time: 2010-0712 PT Time Calculation (min) (ACUTE ONLY): 12 min  Charges:  $Therapeutic Activity: 8-22 mins                     Lavone Nian, PT, DPT 01/30/21, 4:32 PM    Waunita Schooner 01/30/2021, 4:30 PM

## 2021-01-30 NOTE — Plan of Care (Signed)
PMT note:  Chart reviewed. Plans in place for D/C home with [facility] services.   PMT recommends outpatient palliative to follow.

## 2021-01-30 NOTE — Consult Note (Signed)
Consultation Note Date: 01/30/2021   Patient Name: Gina Chambers  DOB: Sep 02, 1940  MRN: [id number]  Age / Sex: 80 y.o., female  PCP: Kirk Ruths, MD Referring Physician: Karie Kirks, DO  Reason for Consultation: Establishing goals of care  HPI/Patient Profile:  Gina Chambers is a 80 y.o. female with medical history significant of hypertension, diabetes mellitus, COPD, asthma, GERD, hypothyroidism, OSA on CPAP, DVT on Xarelto, dCHF, vertigo, GI bleeding, COVID-19 infection, cholecystitis, who presents with abdominal pain.  Clinical Assessment and Goals of Care: Requested to speak with patient by staff. Primary MD is at bedside. Daughter is at bedside. Discussion of planning moving forward; hospice vs palliative. Patient tells me she wants to go home. She would like home health, and would be interested in returning to the hospital if she became sick again.   Daughter voices concern for her mother being alone all day as she and her husband work. She states she is afraid her mother would be unsafe. Discussed rehab. Patient and family are amenable to this if patient is deemed eligible by PT/OT.   Recommend palliative at D/C.     SUMMARY OF RECOMMENDATIONS   Recommend palliative at D/C.   Prognosis:  Unable to determine      Primary Diagnoses: Present on Admission:  Acute cholecystitis  Essential hypertension  Acquired hypothyroidism  COPD (chronic obstructive pulmonary disease) (HCC)  DVT (deep venous thrombosis) (HCC)  Chronic diastolic CHF (congestive heart failure) (HCC)  Sepsis (HCC)  Right shoulder pain  Hyponatremia  Leukocytosis   I have reviewed the medical record, interviewed the patient and family, and examined the patient. The following aspects are pertinent.  Past Medical History:  Diagnosis Date   Asthma    Asthmatic bronchitis , chronic (HCC)    CHF (congestive heart  failure) ([place])    Diabetes mellitus without complication ([place])    DVT (deep venous thrombosis) (HCC)    x3 - both legs. last one approx 2013   Family history of adverse reaction to anesthesia    Mother - temporary paralysis of 1 side   GERD (gastroesophageal reflux disease)    Hypertension    Neuropathy    feet   PONV (postoperative nausea and vomiting)    Pt reports violent vomiting with ANY pain meds given with anesthesia.   Sleep apnea    CPAP   Thyroid disease    Vertigo    daily   Social History   Socioeconomic History   Marital status: Single    Spouse name: Not on file   Number of children: Not on file   Years of education: Not on file   Highest education level: Not on file  Occupational History   Not on file  Tobacco Use   Smoking status: Never   Smokeless tobacco: Never  Vaping Use   Vaping Use: Never used  Substance and Sexual Activity   Alcohol use: No   Drug use: No   Sexual activity: Not on file  Other Topics Concern  Not on file  Social History Narrative   Not on file   Social Determinants of Health   Financial Resource Strain: Not on file  Food Insecurity: Not on file  Transportation Needs: Not on file  Physical Activity: Not on file  Stress: Not on file  Social Connections: Not on file   Family History  Problem Relation Age of Onset   Breast cancer Mother        75's   Breast cancer Maternal Grandmother    Breast cancer Paternal Grandmother    Scheduled Meds:  aspirin EC  81 mg Oral QHS   diltiazem  180 mg Oral BID   fluticasone  2 puff Inhalation BID   gabapentin  100 mg Oral QID   insulin aspart  0-5 Units Subcutaneous QHS   insulin aspart  0-9 Units Subcutaneous TID WC   insulin glargine-yfgn  10 Units Subcutaneous Daily   And   insulin glargine-yfgn  5 Units Subcutaneous QHS   levothyroxine  125 mcg Oral Q0600   metoprolol succinate  50 mg Oral Daily   montelukast  10 mg Oral QHS   multivitamin with minerals   Oral Daily    pantoprazole  20 mg Oral Daily   rivaroxaban  20 mg Oral Q supper   sodium chloride flush  5 mL Intracatheter Q8H   spironolactone  25 mg Oral Daily   torsemide  40 mg Oral BID   Continuous Infusions:  sodium chloride     PRN Meds:.sodium chloride, acetaminophen, dextromethorphan-guaiFENesin, hydrALAZINE, ibuprofen, levalbuterol, metoprolol tartrate, ondansetron (ZOFRAN) IV, ondansetron (ZOFRAN) IV Medications Prior to Admission:  Prior to Admission medications   Medication Sig Start Date End Date Taking? Authorizing Provider  aspirin EC 81 MG tablet Take 81 mg by mouth at bedtime.   Yes [provider]  bumetanide (BUMEX) 0.5 MG tablet Take 1 mg by mouth 2 (two) times daily.   Yes [provider]  Chromium Picolinate 200 MCG CAPS Take 200 mcg by mouth daily.   Yes [provider]  clobetasol cream (TEMOVATE) 3.08 % Apply 1 application topically 2 (two) times daily.   Yes [provider]  diphenhydrAMINE (BENADRYL) 25 mg capsule Take 25 mg by mouth every 6 (six) hours as needed. 04/27/19  Yes [provider]  fluticasone (FLOVENT HFA) 110 MCG/ACT inhaler Inhale 2 puffs into the lungs 2 (two) times daily.   Yes [provider]  gabapentin (NEURONTIN) 100 MG capsule Take 100 mg by mouth 4 (four) times daily.   Yes [provider]  insulin glargine (LANTUS) 100 UNIT/ML injection Inject 10-20 Units into the skin See admin instructions. Inject 20u under the skin every morning and inject 10u under the skin ever night at bedtime   Yes [provider]  insulin regular (NOVOLIN R,HUMULIN R) 250 units/2.47mL (100 units/mL) injection Inject 10 Units into the skin 3 (three) times daily before meals.   Yes [provider]  Ipratropium-Albuterol (COMBIVENT) 20-100 MCG/ACT AERS respimat Inhale 1 puff into the lungs 4 (four) times daily as needed for wheezing or shortness of breath.   Yes [provider]  lansoprazole  (PREVACID) 30 MG capsule Take 30 mg by mouth 2 (two) times daily.   Yes [provider]  levalbuterol Penne Lash) 0.63 MG/3ML nebulizer solution Inhale 0.63 mg into the lungs every 6 (six) hours as needed. 04/27/19  Yes [provider]  levothyroxine (SYNTHROID, LEVOTHROID) 125 MCG tablet Take 125 mcg by mouth daily.   Yes  [provider]  Magnesium 500 MG TABS Take 500 mg by mouth 4 (four) times daily.    Yes [provider]  metoprolol succinate (TOPROL-XL) 50 MG 24 hr tablet Take 50 mg by mouth daily. Take with or immediately following a meal.   Yes [provider]  mirabegron ER (MYRBETRIQ) 25 MG TB24 tablet Take 25 mg by mouth daily.   Yes [provider]  montelukast (SINGULAIR) 10 MG tablet Take 10 mg by mouth at bedtime.   Yes [provider]  Multiple Vitamins-Minerals (CENTRUM SILVER PO) Take 1 tablet by mouth daily.   Yes [provider]  Multiple Vitamins-Minerals (PRESERVISION AREDS 2 PO) Take 1 tablet by mouth 2 (two) times daily.   Yes [provider]  rivaroxaban (XARELTO) 20 MG TABS tablet Take 20 mg by mouth daily.   Yes [provider]  Ubiquinol 300 MG CAPS Take 1 capsule by mouth daily.   Yes [provider]  vitamin E 1000 UNIT capsule Take 1 capsule by mouth daily.   Yes [provider]  zinc gluconate 50 MG tablet Take 50 mg by mouth daily.   Yes [provider]   Allergies  Allergen Reactions   Ropinirole Nausea And Vomiting   Azelastine Other (See Comments)    Closes airways  Closes airways     Bupropion     GI issues    Calcium     Chest pain    Carbidopa-Levodopa Other (See Comments)    Patient reports chest pain, back pain and left side pain   Cefuroxime     Swelling    Cephalexin    Clinoril [Sulindac]     GI   Codeine Hives   Duloxetine     Bleeding    Duloxetine Hcl Other (See Comments)    Bleeding   Ezetimibe     Joint pain     Fenofibrate     Leg cramps   Furosemide     Fluid retention    Glipizide     Bloating    Lorazepam     SI   Lortab [Hydrocodone-Acetaminophen] Hives   Metaproterenol     Palpitations    Morphine And Related    Nalbuphine     Rapid heart rate  flushing    Naproxen     Numbness    Norfloxacin     Urinary retention    Rofecoxib    Sodium    Statins     Muscle pain    Sulfa Antibiotics     Unknown    Sulfasalazine Other (See Comments)    Unknown   Tramadol    Trazodone And Nefazodone    Doxycycline Rash   Iodine Rash   Ketoprofen Rash   Piroxicam Rash   Tolmetin Rash   Review of Systems  All other systems reviewed and are negative.  Physical Exam Pulmonary:     Effort: Pulmonary effort is normal.  Neurological:     Mental Status: She is alert.    Vital Signs: BP 101/60 (BP Location: Right Arm)   Pulse 83   Temp 98.1 F (36.7 C)   Resp 18   Ht 5\' 3"  (1.6 m)   Wt 77.8 kg   SpO2 98%   BMI 30.38 kg/m  Pain Scale: 0-10 POSS *See Group Information*: 1-Acceptable,Awake and alert Pain Score: 1    SpO2: SpO2: 98 % O2 Device:SpO2: 98 % O2 Flow Rate: .O2 Flow Rate (L/min): 2  L/min  IO: Intake/output summary:  Intake/Output Summary (Last 24 hours) at 01/30/2021 1609 Last data filed at 01/30/2021 1550 Gross per 24 hour  Intake 1565 ml  Output 1995 ml  Net -430 ml    LBM: Last BM Date: 01/30/21 Baseline Weight: Weight: 79.8 kg Most recent weight: Weight: 77.8 kg      Flowsheet Rows    Flowsheet Row Most Recent Value  Intake Tab   Referral Department Hospitalist  Unit at Time of Referral Cardiac/Telemetry Unit  Palliative Care Primary Diagnosis Sepsis/Infectious Disease  Date Notified 01/18/21  Palliative Care Type New Palliative care  Reason for referral Clarify Goals of Care  Date of Admission 01/15/21  Date first seen by Palliative Care 01/19/21  # of days Palliative referral response time 1 Day(s)  # of days IP prior to Palliative  referral 3  Clinical Assessment   Psychosocial & Spiritual Assessment   Palliative Care Outcomes        Time In: 3:00 Time Out: 3:30 Time Total: 30 min Greater than 50%  of this time was spent counseling and coordinating care related to the above assessment and plan.  Signed by: Asencion Gowda, NP   Please contact Palliative Medicine Team phone at [phone] for questions and concerns.  For individual provider: See Shea Evans

## 2021-01-30 NOTE — NC FL2 (Signed)
[place] LEVEL OF CARE SCREENING TOOL     IDENTIFICATION  Patient Name: Gina Chambers Birthdate: [birth year] Sex: female Admission Date (Current Location): 01/15/2021  [facility] and [place] Number:  Engineering geologist and Address:  [facility], [address], [place], [place] [address]      Provider Number: [id number]  Attending Physician Name and Address:  Karie Kirks, DO  Relative Name and Phone Number:  Arnetha Gula [phone]    Current Level of Care: Hospital Recommended Level of Care: [place] Prior Approval Number:    Date Approved/Denied:   PASRR Number: [id number]  Discharge Plan: SNF    Current Diagnoses: Patient Active Problem List   Diagnosis Date Noted   Paroxysmal atrial flutter ([place]) 01/20/2021   Acute cholecystitis 01/15/2021   Right shoulder pain 01/15/2021   COPD (chronic obstructive pulmonary disease) (HCC)    DVT (deep venous thrombosis) (HCC)    OSA on CPAP    Chronic diastolic CHF (congestive heart failure) ([place])    Sepsis ([place])    Inguinal abscess    Anticoagulated    COVID-19 virus infection 09/24/2020   Type 2 diabetes mellitus without complication ([place]) 84/69/6295   Hyponatremia 09/24/2020   Cholecystitis 09/23/2020   Chest pain 12/24/2018   Hypoglycemia 11/07/2017   GIB (gastrointestinal bleeding) 11/07/2016   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) ([place]) 06/19/2016   COPD exacerbation ([place]) 05/12/2016   Community acquired pneumonia 05/12/2016   Leukocytosis 05/12/2016   Generalized weakness 05/12/2016   Acquired hypothyroidism 12/15/2015   Essential hypertension 12/15/2015    Orientation RESPIRATION BLADDER Height & Weight     Self, Time, Situation, Place  Normal Incontinent, External catheter Weight: 171 lb 8 oz (77.8 kg) Height:  5\' 3"  (160 cm)  BEHAVIORAL SYMPTOMS/MOOD NEUROLOGICAL BOWEL NUTRITION STATUS      Continent Diet (see discharge  summary)  AMBULATORY STATUS COMMUNICATION OF NEEDS Skin   Extensive Assist Verbally Normal                       Personal Care Assistance Level of Assistance  Bathing, Feeding, Dressing, Total care Bathing Assistance: Maximum assistance Feeding assistance: Limited assistance Dressing Assistance: Maximum assistance Total Care Assistance: Maximum assistance   Functional Limitations Info  Sight, Hearing, Speech Sight Info: Impaired Hearing Info: Adequate Speech Info: Adequate    SPECIAL CARE FACTORS FREQUENCY  PT (By licensed PT), OT (By licensed OT)     PT Frequency: min 3x weekly OT Frequency: min 3x weekly            Contractures Contractures Info: Not present    Additional Factors Info  Code Status, Allergies Code Status Info: DNR Allergies Info: Ropinirole   Azelastine   Bupropion   Calcium   Carbidopa-levodopa   Cefuroxime   Cephalexin   Clinoril (Sulindac)   Codeine   Duloxetine   Duloxetine Hcl   Ezetimibe   Fenofibrate   Furosemide   Glipizide   Lorazepam   Lortab (Hydrocodone-acetaminophen)   Metaproterenol   Morphine And Related   Nalbuphine   Naproxen   Norfloxacin   Rofecoxib   Sodium   Statins   Sulfa Antibiotics   Sulfasalazine   Tramadol   Trazodone And Nefazodone   Doxycycline   Iodine   Ketoprofen   Piroxicam   Tolmetin           Current Medications (01/30/2021):  This is the current hospital active medication list  Current Facility-Administered Medications  Medication Dose Route Frequency Provider Last Rate Last Admin   0.9 %  sodium chloride infusion   Intravenous PRN Sharen Hones, MD       acetaminophen (TYLENOL) tablet 650 mg  650 mg Oral Q6H PRN Ivor Costa, MD   650 mg at 01/30/21 0331   aspirin EC tablet 81 mg  81 mg Oral QHS Ivor Costa, MD   81 mg at 01/29/21 2206   dextromethorphan-guaiFENesin ([place] DM) 30-600 MG per 12 hr tablet 1 tablet  1 tablet Oral BID PRN Ivor Costa, MD       diltiazem (CARDIZEM CD) 24 hr capsule 180 mg  180 mg Oral  BID Max Sane, MD   180 mg at 01/30/21 1008   fluticasone (FLOVENT HFA) 110 MCG/ACT inhaler 2 puff  2 puff Inhalation BID Dallie Piles, RPH   2 puff at 01/30/21 1010   gabapentin (NEURONTIN) capsule 100 mg  100 mg Oral QID Ivor Costa, MD   100 mg at 01/30/21 1447   hydrALAZINE (APRESOLINE) injection 5 mg  5 mg Intravenous Q2H PRN Ivor Costa, MD       ibuprofen (ADVIL) tablet 600 mg  600 mg Oral Q6H PRN Max Sane, MD   600 mg at 01/29/21 2242   insulin aspart (novoLOG) injection 0-5 Units  0-5 Units Subcutaneous QHS Ivor Costa, MD   2 Units at 01/29/21 2240   insulin aspart (novoLOG) injection 0-9 Units  0-9 Units Subcutaneous TID WC Ivor Costa, MD   2 Units at 01/30/21 1237   insulin glargine-yfgn (SEMGLEE) injection 10 Units  10 Units Subcutaneous Daily Ivor Costa, MD   10 Units at 01/30/21 7829   And   insulin glargine-yfgn (SEMGLEE) injection 5 Units  5 Units Subcutaneous QHS Ivor Costa, MD   5 Units at 01/29/21 2241   levalbuterol (XOPENEX) nebulizer solution 0.63 mg  0.63 mg Nebulization Q6H PRN Ivor Costa, MD   0.63 mg at 01/24/21 2039   levothyroxine (SYNTHROID) tablet 125 mcg  125 mcg Oral Q0600 Ivor Costa, MD   125 mcg at 01/30/21 0700   metoprolol succinate (TOPROL-XL) 24 hr tablet 50 mg  50 mg Oral Daily Ivor Costa, MD   50 mg at 01/30/21 1006   metoprolol tartrate (LOPRESSOR) injection 5 mg  5 mg Intravenous Q6H PRN Sharen Hones, MD       montelukast (SINGULAIR) tablet 10 mg  10 mg Oral QHS Ivor Costa, MD   10 mg at 01/29/21 2206   multivitamin with minerals tablet   Oral Daily Ivor Costa, MD   1 tablet at 01/30/21 1005   ondansetron [facility]) injection 4 mg  4 mg Intravenous Q8H PRN Ivor Costa, MD   4 mg at 01/25/21 0905   ondansetron (ZOFRAN) injection    PRN Aletta Edouard, MD   4 mg at 01/19/21 0855   pantoprazole (PROTONIX) EC tablet 20 mg  20 mg Oral Daily Ivor Costa, MD   20 mg at 01/30/21 1006   rivaroxaban (XARELTO) tablet 20 mg  20 mg Oral Q supper Dallie Piles, RPH    20 mg at 01/29/21 1703   sodium chloride flush (NS) 0.9 % injection 5 mL  5 mL Intracatheter Q8H Aletta Edouard, MD   5 mL at 01/30/21 1448   spironolactone (ALDACTONE) tablet 25 mg  25 mg Oral Daily Sharen Hones, MD   25 mg at 01/30/21 1006   torsemide (DEMADEX) tablet 40 mg  40 mg Oral BID  Sharen Hones, MD   40 mg at 01/30/21 1005     Discharge Medications: Please see discharge summary for a list of discharge medications.  Relevant Imaging Results:  Relevant Lab Results:   Additional Information SSN: [id number]  Alberteen Sam, LCSW

## 2021-01-30 NOTE — TOC Progression Note (Addendum)
Transition of Care [facility]) - Progression Note    Patient Details  Name: Gina Chambers MRN: [id number] Date of Birth: Oct 31, 1940  Transition of Care [facility]) CM/SW [place],  Phone Number: 01/30/2021, 3:24 PM  Clinical Narrative:     Update 3:43: CSW was provided an updated by unit director evette that family and NP Crystal and MD Swayze discussed discharge plan and are now wanting SNF. Not hospice or home health. CSW will complete SNF workup, faxed out pending bed offers at this time.   CSW notes per family and patient, patient to discharge home tomorrow with hospice. [facility] is coordinating home hospice services with patient and family.        Expected Discharge Plan and Services                                                 Social Determinants of Health (SDOH) Interventions    Readmission Risk Interventions No flowsheet data found.

## 2021-01-30 NOTE — Progress Notes (Addendum)
PROGRESS NOTE  Gina Chambers UQJ:[id number] DOB: 1940-08-19 DOA: 01/15/2021 PCP: Kirk Ruths, MD  Brief History   80 y.o. female with medical history significant of hypertension, diabetes mellitus, COPD, asthma, GERD, hypothyroidism, OSA on CPAP, DVT on Xarelto, dCHF, vertigo, GI bleeding, COVID-19 infection, cholecystitis admitted for worsening abdominal pain.  She was diagnosed with acute cholecystitis with cholelithiasis.  Cholecystostomy tube was placed on 9/26, was on IV Abx and switched to PO Augmentin on 10/3   Patient developed atrial flutter with rapid ventricular response 9/30, seen by cardiology, given diltiazem, Toprol and digoxin. Heart rate is much better controlled. Cardiology has recommended cardiac rehab. No further cardiac testing is anticipated.  The patient has decided that she is not interested in hospice. Plan is for her to discharge to home with home health services. Dr. Allyne Gee has spoken with the patient. Once the drain is out/has been in for a minimum of 6-8 weeks, she will follow up with him as outpatient and consideration will be given to cholecystectomy in the future.   Plan is for patient to discharge to home with home health when [facility] can be arranged.  Consultants  Cardiology Palliative Care General surgery Interventional radiology  Procedures  Cholecystostomy tube placement 01/16/2021  Antibiotics   Anti-infectives (From admission, onward)    Start     Dose/Rate Route Frequency Ordered Stop   01/23/21 2200  amoxicillin-clavulanate (AUGMENTIN) 875-125 MG per tablet 1 tablet        1 tablet Oral Every 12 hours 01/23/21 1146 01/29/21 2206   01/20/21 1600  Ampicillin-Sulbactam (UNASYN) 3 g in sodium chloride 0.9 % 100 mL IVPB        3 g 200 mL/hr over 30 Minutes Intravenous Every 6 hours 01/20/21 1457 01/23/21 1706   01/15/21 2200  piperacillin-tazobactam (ZOSYN) IVPB 3.375 g  Status:  Discontinued        3.375 g 12.5 mL/hr over 240 Minutes  Intravenous Every 8 hours 01/15/21 1626 01/20/21 1457   01/15/21 1545  piperacillin-tazobactam (ZOSYN) IVPB 3.375 g        3.375 g 100 mL/hr over 30 Minutes Intravenous  Once 01/15/21 1543 01/15/21 1715      Subjective  The patient is resting comfortably. No new complaints.   Objective   Vitals:  Vitals:   01/30/21 0744 01/30/21 1154  BP: (!) 110/46 (!) 123/54  Pulse: 70 83  Resp: 20 17  Temp: 98 F (36.7 C) 98.1 F (36.7 C)  SpO2: 97% 100%    Exam:  Constitutional:  Awake, alert, and oriented x 3. No acute distress. Respiratory:  CTA bilaterally, no w/r/r.  Respiratory effort normal. No retractions or accessory muscle use Cardiovascular:  RRR, no m/r/g No LE extremity edema   Normal pedal pulses Abdomen:  Abdomen appears normal; no tenderness or masses No hernias No HSM Musculoskeletal:  Digits/nails BUE: no clubbing, cyanosis, petechiae, infection exam of joints, bones, muscles of at least one of following: head/neck, RUE, LUE, RLE, LLE   Skin:  No rashes, lesions, ulcers palpation of skin: no induration or nodules Neurologic:  CN 2-12 intact Sensation all 4 extremities intact Psychiatric:  Mental status: Depressed Mood, affect appropriate Orientation to person, place, time   I have personally reviewed the following:   Today's Data  Vitals  Lab Data  CBC   Imaging  CT abdomen and pelvis  Cardiology Data  EKG Echocardiogram  Scheduled Meds:  aspirin EC  81 mg Oral QHS   diltiazem  180  mg Oral BID   fluticasone  2 puff Inhalation BID   gabapentin  100 mg Oral QID   insulin aspart  0-5 Units Subcutaneous QHS   insulin aspart  0-9 Units Subcutaneous TID WC   insulin glargine-yfgn  10 Units Subcutaneous Daily   And   insulin glargine-yfgn  5 Units Subcutaneous QHS   levothyroxine  125 mcg Oral Q0600   metoprolol succinate  50 mg Oral Daily   montelukast  10 mg Oral QHS   multivitamin with minerals   Oral Daily   pantoprazole  20 mg Oral  Daily   rivaroxaban  20 mg Oral Q supper   sodium chloride flush  5 mL Intracatheter Q8H   spironolactone  25 mg Oral Daily   torsemide  40 mg Oral BID   Continuous Infusions:  sodium chloride      Principal Problem:   Acute cholecystitis Active Problems:   Leukocytosis   Generalized weakness   Acquired hypothyroidism   Essential hypertension   Type 2 diabetes mellitus without complication (HCC)   Hyponatremia   COPD (chronic obstructive pulmonary disease) (HCC)   DVT (deep venous thrombosis) (HCC)   OSA on CPAP   Chronic diastolic CHF (congestive heart failure) (HCC)   Sepsis (HCC)   Right shoulder pain   Paroxysmal atrial flutter ([place])   LOS: 15 days   [facility] Problems * Acute cholecystitis S/p IR guided cholecystostomy tube on 9/26.  Tube will need to stay in place for 6 to 8 weeks per surgery.  On oral Augmentin. WBC decreasing. Dr Allyne Gee discussed cholecystectomy with the patient in 6-8 weeks when her tube is removed, she will see him as outpatient and be evaluated for possible cholecystectomy.   Paroxysmal atrial flutter (HCC) Patient's heart rate at rest mostly between 40/s to 80's. With any exertion, however it increases to the 120's-130's. Likely due to deconditioning.  Cardiology following. Continue Cardizem CD 180 mg twice a day.  Blood pressure will not likely tolerate a higher dose. Continue metoprolol (increased to 50 mg today) and Xarelto. Cardiology has recommended cardiac rehab for patient upon discharge.    Acquired hypothyroidism Continue Synthroid   Generalized weakness Improving. Multifactorial.  PT and OT recommend Home with home health now.  TOC aware and working on arrangements.   Leukocytosis Decreasing. Monitor.    I have seen and examined this patient myself. I have spent 32 minutes in her evaluation and care.   Sebron Mcmahill, DO [facility] Direct contact: see [email or web address]  7PM-7AM contact night coverage as  above 01/30/2021, 2:28 PM  LOS: 10 days   ADDENDUM: Late this afternoon I was called back to the floor to talk again with the patient's daughter and son-in-law regarding her disposition. Palliative care nurse Gina Chambers joined me in the patient's room for this talk. First, I explained to the patient and family that despite the statement made by nursing that the patient was being discharged today, I had not discharged the patient. I and Gina Chambers explained to them that the patient is not appropriate for hospice. I explained that we could have the patient re-evaluated by PT with an eye to getting her into SNF. Unfortunately/fortunately the patient is ambulating 700 feet and does not require SNF. For this reason the patient will now be discharged to home with home health.

## 2021-01-31 DIAGNOSIS — K810 Acute cholecystitis: Secondary | ICD-10-CM | POA: Diagnosis not present

## 2021-01-31 LAB — CBC WITH DIFFERENTIAL/PLATELET
Abs Immature Granulocytes: 0.2 10*3/uL — ABNORMAL HIGH (ref 0.00–0.07)
Basophils Absolute: 0.1 10*3/uL (ref 0.0–0.1)
Basophils Relative: 1 %
Eosinophils Absolute: 0.2 10*3/uL (ref 0.0–0.5)
Eosinophils Relative: 1 %
HCT: 31.2 % — ABNORMAL LOW (ref 36.0–46.0)
Hemoglobin: 10.4 g/dL — ABNORMAL LOW (ref 12.0–15.0)
Immature Granulocytes: 1 %
Lymphocytes Relative: 35 %
Lymphs Abs: 5.1 10*3/uL — ABNORMAL HIGH (ref 0.7–4.0)
MCH: 28.7 pg (ref 26.0–34.0)
MCHC: 33.3 g/dL (ref 30.0–36.0)
MCV: 86.2 fL (ref 80.0–100.0)
Monocytes Absolute: 1.4 10*3/uL — ABNORMAL HIGH (ref 0.1–1.0)
Monocytes Relative: 10 %
Neutro Abs: 7.4 10*3/uL (ref 1.7–7.7)
Neutrophils Relative %: 52 %
Platelets: 247 10*3/uL (ref 150–400)
RBC: 3.62 MIL/uL — ABNORMAL LOW (ref 3.87–5.11)
RDW: 14.9 % (ref 11.5–15.5)
Smear Review: NORMAL
WBC: 14.3 10*3/uL — ABNORMAL HIGH (ref 4.0–10.5)
nRBC: 0 % (ref 0.0–0.2)

## 2021-01-31 LAB — GLUCOSE, CAPILLARY
Glucose-Capillary: 135 mg/dL — ABNORMAL HIGH (ref 70–99)
Glucose-Capillary: 140 mg/dL — ABNORMAL HIGH (ref 70–99)
Glucose-Capillary: 182 mg/dL — ABNORMAL HIGH (ref 70–99)

## 2021-01-31 LAB — CULTURE, BLOOD (ROUTINE X 2)
Culture: NO GROWTH
Culture: NO GROWTH
Special Requests: ADEQUATE
Special Requests: ADEQUATE

## 2021-01-31 MED ORDER — GABAPENTIN 100 MG PO CAPS: 100.0000 mg | ORAL_CAPSULE | Freq: Two times a day (BID) | ORAL | Status: DC

## 2021-01-31 MED ORDER — IBUPROFEN 600 MG PO TABS: 600.0000 mg | ORAL_TABLET | Freq: Four times a day (QID) | ORAL | 0 refills | Status: DC | PRN

## 2021-01-31 MED ORDER — DILTIAZEM HCL ER COATED BEADS 180 MG PO CP24: 180.0000 mg | ORAL_CAPSULE | Freq: Two times a day (BID) | ORAL | 0 refills | Status: DC

## 2021-01-31 MED ORDER — GABAPENTIN 100 MG PO CAPS: 200.0000 mg | ORAL_CAPSULE | Freq: Every day | ORAL | 0 refills | Status: DC

## 2021-01-31 MED ORDER — SPIRONOLACTONE 25 MG PO TABS
25.0000 mg | ORAL_TABLET | Freq: Every day | ORAL | 0 refills | Status: DC
Start: 2021-02-01 — End: 2023-02-04

## 2021-01-31 MED ORDER — TORSEMIDE 40 MG PO TABS: 40.0000 mg | ORAL_TABLET | Freq: Two times a day (BID) | ORAL | 0 refills | Status: DC

## 2021-01-31 MED ORDER — GABAPENTIN 100 MG PO CAPS: 200.0000 mg | ORAL_CAPSULE | Freq: Every day | ORAL | Status: DC

## 2021-01-31 MED ORDER — GABAPENTIN 100 MG PO CAPS: 100.0000 mg | ORAL_CAPSULE | Freq: Two times a day (BID) | ORAL | 0 refills | Status: DC

## 2021-01-31 NOTE — Discharge Summary (Signed)
Physician Discharge Summary  Gina Chambers TFT:[id number] DOB: 01/14/1941 DOA: 01/15/2021  PCP: Kirk Ruths, MD  Admit date: 01/15/2021 Discharge date: 01/31/2021  Recommendations for Outpatient Follow-up:  Discharge to home with home health PT/OT/RN/Home health Aide Follow up with PCP in 7-10 days. Have CBC and BMP checked on that date to be reported to PCP Patient is to follow up with Interventional Radiology as directed. Follow up with Dr. Allyne Gee regarding gallbladder after tube is out. Make an appointment.   Follow-up Information     Aletta Edouard, MD Follow up.   Specialties: Interventional Radiology, Radiology Contact information: [address] [place] [address] [phone]         Corey Skains, MD Follow up in 1 week(s).   Specialty: Cardiology Contact information: [address] [facility] [place] [address] [phone]                  Discharge Diagnoses: Principal diagnosis is #1 Cholecystitis  Paroxysmal atrial flutter Acquired hypothyroidism Generalized weakness Leukocytosis  Discharge Condition: Fair  Disposition: Home with home health  Diet recommendation: Heart healthy  Filed Weights   01/29/21 0630 01/30/21 0317 01/31/21 0422  Weight: 84.3 kg 77.8 kg 77.1 kg    History of present illness:  Gina Chambers is a 80 y.o. female with medical history significant of hypertension, diabetes mellitus, COPD, asthma, GERD, hypothyroidism, OSA on CPAP, DVT on Xarelto, dCHF, vertigo, GI bleeding, COVID-19 infection, cholecystitis, who presents with abdominal pain.   Her abdominal pain started this morning, which is located in the right upper quadrant, constant, sharp, severe, radiating to the right shoulder.  Patient complains of right shoulder pain, particularly on movement.  Associated with nausea and nonbilious nonbloody vomiting for more than 10 times.  No fever or chills.  No  diarrhea.  Patient denies chest pain, cough, shortness breath.  No symptoms of UTI.   Of note, pt had hx of cholecystitis in June 2022. She was treated with IV Zosyn in hospital and discharged on Augmentin.  She had a Coley ostomy tube placed, which was supposed to remain in place for 6 to 8 weeks. Pt states that she had tube in place longer than that.   ED Course: pt was found to have WBC 14.2, troponin level 10, negative COVID PCR, renal function okay, temperature normal, blood pressure 151/66, heart rate 95, RR 20, oxygen saturation 99% on room air.  Chest x-ray negative.  CT scan of abdomen/pelvis and right upper quadrant ultrasound were performed, both are suggestive for acute cholecystitis.  Patient is admitted to [facility] bed as inpatient.  General surgeon Dr. Celine Ahr was consulted.   CT-abd/pelvis: 1. Appearance of the gallbladder remains suspicious for acute cholecystitis.  2. Small volume perihepatic ascites.  3. Aortic Atherosclerosis (ICD10-I70.0).   US-RUQ: 1. Distended gallbladder with wall thickening and dependent stones and sludge as well as a small amount of pericholecystic fluid. Findings are similar to the prior ultrasound and support acute cholecystitis in the proper clinical setting.  Hospital Course:   She was diagnosed with acute cholecystitis with cholelithiasis.  Cholecystostomy tube was placed on 9/26, was on IV Abx and switched to PO Augmentin on 10/3   Patient developed atrial flutter with rapid ventricular response 9/30, seen by cardiology, given diltiazem, Toprol and digoxin. Heart rate is much better controlled. Cardiology has recommended cardiac rehab. No further cardiac testing is anticipated.   The patient has decided that she is not  interested in hospice. Plan is for her to discharge to home with home health services. Dr. Allyne Gee has spoken with the patient. Once the drain is out/has been in for a minimum of 6-8 weeks, she will follow up with him as outpatient  and consideration will be given to cholecystectomy in the future.    Late this afternoon I was called back to the floor to talk again with the patient's daughter and son-in-law regarding her disposition. Palliative care nurse Flo Shanks joined me in the patient's room for this talk. First, I explained to the patient and family that despite the statement made by nursing that the patient was being discharged today, I had not discharged the patient. I and Santiago Glad explained to them that the patient is not appropriate for hospice. I explained that we could have the patient re-evaluated by PT with an eye to getting her into SNF. Unfortunately/fortunately the patient is ambulating 700 feet and does not require SNF. For this reason the patient will now be discharged to home with home health PT/OT.  Today's assessment: S: The patient is resting comfortably. No new complaints.  O: Vitals:  Vitals:   01/31/21 1523 01/31/21 1525  BP: (!) 95/34 (!) 101/44  Pulse: 80 81  Resp: 16   Temp: 98.1 F (36.7 C)   SpO2: 98% 97%    Exam:   Constitutional:  Awake, alert, and oriented x 3. No acute distress. Respiratory:  CTA bilaterally, no w/r/r.  Respiratory effort normal. No retractions or accessory muscle use Cardiovascular:  RRR, no m/r/g No LE extremity edema   Normal pedal pulses Abdomen:  Abdomen appears normal; no tenderness or masses No hernias No HSM Musculoskeletal:  Digits/nails BUE: no clubbing, cyanosis, petechiae, infection exam of joints, bones, muscles of at least one of following: head/neck, RUE, LUE, RLE, LLE   Skin:  No rashes, lesions, ulcers palpation of skin: no induration or nodules Neurologic:  CN 2-12 intact Sensation all 4 extremities intact Psychiatric:  Mental status: Depressed Mood, affect appropriate Orientation to person, place, time   Discharge Instructions  Discharge Instructions     Activity as tolerated - No restrictions   Complete by: As directed     Call MD for:  persistant nausea and vomiting   Complete by: As directed    Call MD for:  severe uncontrolled pain   Complete by: As directed    Call MD for:  temperature >100.4   Complete by: As directed    Diet - low sodium heart healthy   Complete by: As directed    Discharge instructions   Complete by: As directed    Discharge to home with home health PT/OT/RN/Home health Aide Follow up with PCP in 7-10 days. Have CBC and BMP checked on that date to be reported to PCP Patient is to follow up with Interventional Radiology as directed. Follow up with Dr. Allyne Gee regarding gallbladder after tube is out. Make an appointment.   Increase activity slowly   Complete by: As directed       Allergies as of 01/31/2021       Reactions   Ropinirole Nausea And Vomiting   Azelastine Other (See Comments)   Closes airways  Closes airways    Bupropion    GI issues   Calcium    Chest pain   Carbidopa-levodopa Other (See Comments)   Patient reports chest pain, back pain and left side pain   Cefuroxime    Swelling   Cephalexin  Clinoril [sulindac]    GI   Codeine Hives   Duloxetine    Bleeding   Duloxetine Hcl Other (See Comments)   Bleeding   Ezetimibe    Joint pain   Fenofibrate    Leg cramps   Furosemide    Fluid retention    Glipizide    Bloating   Lorazepam    SI   Lortab [hydrocodone-acetaminophen] Hives   Metaproterenol    Palpitations    Morphine And Related    Nalbuphine    Rapid heart rate  flushing    Naproxen    Numbness   Norfloxacin    Urinary retention    Rofecoxib    Sodium    Statins    Muscle pain   Sulfa Antibiotics    Unknown   Sulfasalazine Other (See Comments)   Unknown   Tramadol    Trazodone And Nefazodone    Doxycycline Rash   Iodine Rash   Ketoprofen Rash   Piroxicam Rash   Tolmetin Rash        Medication List     STOP taking these medications    Chromium Picolinate 200 MCG Caps   Magnesium 500 MG Tabs   Ubiquinol  300 MG Caps   vitamin E 1000 UNIT capsule   zinc gluconate 50 MG tablet       TAKE these medications    aspirin EC 81 MG tablet Take 81 mg by mouth at bedtime.   bumetanide 0.5 MG tablet Commonly known as: BUMEX Take 1 mg by mouth 2 (two) times daily.   clobetasol cream 0.05 % Commonly known as: TEMOVATE Apply 1 application topically 2 (two) times daily.   diltiazem 180 MG 24 hr capsule Commonly known as: CARDIZEM CD Take 1 capsule (180 mg total) by mouth 2 (two) times daily.   diphenhydrAMINE 25 mg capsule Commonly known as: BENADRYL Take 25 mg by mouth every 6 (six) hours as needed.   fluticasone 110 MCG/ACT inhaler Commonly known as: FLOVENT HFA Inhale 2 puffs into the lungs 2 (two) times daily.   gabapentin 100 MG capsule Commonly known as: NEURONTIN Take 1 capsule (100 mg total) by mouth 2 (two) times daily. What changed: when to take this   gabapentin 100 MG capsule Commonly known as: NEURONTIN Take 2 capsules (200 mg total) by mouth at bedtime. What changed: You were already taking a medication with the same name, and this prescription was added. Make sure you understand how and when to take each.   ibuprofen 600 MG tablet Commonly known as: ADVIL Take 1 tablet (600 mg total) by mouth every 6 (six) hours as needed for headache or moderate pain (Severe pain).   insulin glargine 100 UNIT/ML injection Commonly known as: LANTUS Inject 10-20 Units into the skin See admin instructions. Inject 20u under the skin every morning and inject 10u under the skin ever night at bedtime   insulin regular 100 units/mL injection Commonly known as: NOVOLIN R Inject 10 Units into the skin 3 (three) times daily before meals.   Ipratropium-Albuterol 20-100 MCG/ACT Aers respimat Commonly known as: COMBIVENT Inhale 1 puff into the lungs 4 (four) times daily as needed for wheezing or shortness of breath.   lansoprazole 30 MG capsule Commonly known as: PREVACID Take 30 mg by  mouth 2 (two) times daily.   levalbuterol 0.63 MG/3ML nebulizer solution Commonly known as: XOPENEX Inhale 0.63 mg into the lungs every 6 (six) hours as needed.   levothyroxine 125 MCG  tablet Commonly known as: SYNTHROID Take 125 mcg by mouth daily.   metoprolol succinate 50 MG 24 hr tablet Commonly known as: TOPROL-XL Take 50 mg by mouth daily. Take with or immediately following a meal.   mirabegron ER 25 MG Tb24 tablet Commonly known as: MYRBETRIQ Take 25 mg by mouth daily.   montelukast 10 MG tablet Commonly known as: SINGULAIR Take 10 mg by mouth at bedtime.   PRESERVISION AREDS 2 PO Take 1 tablet by mouth 2 (two) times daily.   CENTRUM SILVER PO Take 1 tablet by mouth daily.   rivaroxaban 20 MG Tabs tablet Commonly known as: XARELTO Take 20 mg by mouth daily.   spironolactone 25 MG tablet Commonly known as: ALDACTONE Take 1 tablet (25 mg total) by mouth daily. Start taking on: February 01, 2021   Torsemide 40 MG Tabs Take 40 mg by mouth 2 (two) times daily.               Durable Medical Equipment  (From admission, onward)           Start     Ordered   01/31/21 1515  DME Walker  Once       Question Answer Comment  Walker: With 5 Inch Wheels   Patient needs a walker to treat with the following condition Ambulatory dysfunction      01/31/21 1515   01/28/21 1342  For home use only DME Walker rolling  Once       Question Answer Comment  Walker: With 5 Inch Wheels   Patient needs a walker to treat with the following condition Debility      01/28/21 1341           Allergies  Allergen Reactions   Ropinirole Nausea And Vomiting   Azelastine Other (See Comments)    Closes airways  Closes airways     Bupropion     GI issues    Calcium     Chest pain    Carbidopa-Levodopa Other (See Comments)    Patient reports chest pain, back pain and left side pain   Cefuroxime     Swelling    Cephalexin    Clinoril [Sulindac]     GI   Codeine  Hives   Duloxetine     Bleeding    Duloxetine Hcl Other (See Comments)    Bleeding   Ezetimibe     Joint pain    Fenofibrate     Leg cramps   Furosemide     Fluid retention    Glipizide     Bloating    Lorazepam     SI   Lortab [Hydrocodone-Acetaminophen] Hives   Metaproterenol     Palpitations    Morphine And Related    Nalbuphine     Rapid heart rate  flushing    Naproxen     Numbness    Norfloxacin     Urinary retention    Rofecoxib    Sodium    Statins     Muscle pain    Sulfa Antibiotics     Unknown    Sulfasalazine Other (See Comments)    Unknown   Tramadol    Trazodone And Nefazodone    Doxycycline Rash   Iodine Rash   Ketoprofen Rash   Piroxicam Rash   Tolmetin Rash    The results of significant diagnostics from this hospitalization (including imaging, microbiology, ancillary and laboratory) are listed below for reference.    Significant  Diagnostic Studies: CT ABDOMEN PELVIS WO CONTRAST  Result Date: 01/19/2021 CLINICAL DATA:  Abdominal distention. Cholecystitis status post percutaneous cholecystostomy tube placement. Continued leukocytosis, pain. EXAM: CT ABDOMEN AND PELVIS WITHOUT CONTRAST TECHNIQUE: Multidetector CT imaging of the abdomen and pelvis was performed following the standard protocol without IV contrast. COMPARISON:  01/15/2021 FINDINGS: Lower chest: New moderate right pleural effusion with compressive atelectasis/infiltrate in the right lower lobe. Hepatobiliary: Post percutaneous cholecystostomy tube placement. The gallbladder is decompressed. No biliary ductal dilatation. Nodular contours of the liver compatible with cirrhosis. Pancreas: No focal abnormality or ductal dilatation. Spleen: No focal abnormality.  Normal size. Adrenals/Urinary Tract: Perinephric stranding bilaterally, increasing since prior study. No hydronephrosis. Adrenal glands and urinary bladder grossly unremarkable. Stomach/Bowel: Postoperative changes in the stomach.  No bowel obstruction or acute finding. Vascular/Lymphatic: Aortic atherosclerosis. No evidence of aneurysm or adenopathy. Reproductive: Uterus and adnexa unremarkable.  No mass. Other: Trace free fluid in the cul-de-sac. Increasing perihepatic ascites. Musculoskeletal: No acute bony abnormality. IMPRESSION: Interval percutaneous cholecystostomy tube placement with decompression of the gallbladder. Changes of cirrhosis.  Increasing perihepatic ascites. Moderate right pleural effusion with right lower lobe atelectasis or infiltrate. Electronically Signed   By: Rolm Baptise M.D.   On: 01/19/2021 10:30   CT ABDOMEN PELVIS WO CONTRAST  Result Date: 01/15/2021 CLINICAL DATA:  Abdominal distension and right upper quadrant abdominal pain. History of cholecystostomy tube removal on 12/06/2020 EXAM: CT ABDOMEN AND PELVIS WITHOUT CONTRAST TECHNIQUE: Multidetector CT imaging of the abdomen and pelvis was performed following the standard protocol without IV contrast. COMPARISON:  CT 09/23/2020, ultrasound 01/15/2021 FINDINGS: Lower chest: Bibasilar dependent atelectasis. Heart size is normal. No pericardial effusion. Hepatobiliary: Gallbladder is moderately distended with multiple hyperdense gallstones. Gallbladder wall appears diffusely thickened with pericholecystic inflammatory fat stranding and small volume of adjacent fluid. Unenhanced liver is within normal limits. Small volume perihepatic ascites. Pancreas: Unremarkable. No pancreatic ductal dilatation or surrounding inflammatory changes. Spleen: Unremarkable. Adrenals/Urinary Tract: Slightly nodular configuration of the left adrenal gland is unchanged. Unremarkable right adrenal gland. Bilateral kidneys are within normal limits. No renal stone or hydronephrosis. Urinary bladder is unremarkable. Stomach/Bowel: Postsurgical changes again noted at the stomach. No evidence of bowel wall thickening, distention, or inflammatory changes. Vascular/Lymphatic: Scattered  aortoiliac atherosclerotic calcifications without aneurysm. No abdominopelvic lymphadenopathy. Reproductive: Uterus and bilateral adnexa are unremarkable. Other: No organized abdominopelvic fluid collection. No pneumoperitoneum. No abdominal wall hernia. Musculoskeletal: No new or acute bony findings. IMPRESSION: 1. Appearance of the gallbladder remains suspicious for acute cholecystitis. 2. Small volume perihepatic ascites. Aortic Atherosclerosis (ICD10-I70.0). Electronically Signed   By: Davina Poke D.O.   On: 01/15/2021 15:10   DG Chest 2 View  Result Date: 01/15/2021 CLINICAL DATA:  Pt arrived via [facility] with c/o upper abd pain, started a juice cleanse last Wednesday, states it was apple juice, Pt c/o N/V and abd pain. 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 Cardiac silhouette is normal in size. No mediastinal or hilar masses or evidence of adenopathy. Dependent opacity at both lung bases consistent with atelectasis. Remainder of the lungs is clear. No convincing pleural effusion and no pneumothorax. Skeletal structures are intact. IMPRESSION: No acute cardiopulmonary disease. Electronically Signed   By: Lajean Manes M.D.   On: 01/15/2021 13:37   DG Shoulder Right  Result Date: 01/15/2021 CLINICAL DATA:  Right shoulder pain EXAM: RIGHT SHOULDER -  2+ VIEW COMPARISON:  None. FINDINGS: No fracture or malalignment. Moderate AC joint degenerative change with mild glenohumeral degenerative change. Small amount of calcific tendinopathy. IMPRESSION: 1. Degenerative changes without acute osseous abnormality. 2. Calcific tendinopathy Electronically Signed   By: Donavan Foil M.D.   On: 01/15/2021 18:08   US Venous Img Lower Bilateral (DVT)  Result Date: 01/20/2021 CLINICAL DATA:  Positive D-dimer.  History of DVT EXAM: BILATERAL  LOWER EXTREMITY VENOUS DOPPLER ULTRASOUND TECHNIQUE: Gray-scale sonography with compression, as well as color and duplex ultrasound, were performed to evaluate the deep venous system(s) from the level of the common femoral vein through the popliteal and proximal calf veins. COMPARISON:  05/29/2016 right lower extremity Doppler ultrasound. FINDINGS: VENOUS Right: Normal compressibility of the common femoral, superficial femoral, and popliteal veins, as well as the visualized calf veins. Visualized portions of profunda femoral vein and great saphenous vein unremarkable. No filling defects to suggest DVT on grayscale or color Doppler imaging. Doppler waveforms show normal direction of venous flow, normal respiratory plasticity and response to augmentation. Left: Peripheral mid level echoes with incomplete compressibility at the left popliteal vein. The other left lower extremity deep veins showed normal color filling and compressibility. Present respiratory phasicity. IMPRESSION: 1. Nonocclusive clot in the left popliteal vein that is likely nonacute. 2. Negative for right lower extremity DVT. Electronically Signed   By: Jorje Guild M.D.   On: 01/20/2021 06:28   IR Perc Cholecystostomy  Result Date: 01/16/2021 CLINICAL DATA:  Recurrent acute cholecystitis and status post prior percutaneous cholecystostomy tube placement on 09/26/2020. The tube was removed on 12/06/2020 after the patient insisted it be removed. She has developed recurrent acute cholecystitis and sepsis and is not a candidate currently for cholecystectomy. EXAM: PERCUTANEOUS CHOLECYSTOSTOMY COMPARISON:  CT of the abdomen and pelvis on 01/15/2021 ANESTHESIA/SEDATION: Moderate (conscious) sedation was employed during this procedure. A total of Versed 1.0 mg and Fentanyl 100 mcg was administered intravenously. Moderate Sedation Time: 25 minutes. The patient's level of consciousness and vital signs were monitored continuously by radiology nursing  throughout the procedure under my direct supervision. CONTRAST:  39mL OMNIPAQUE IOHEXOL 350 MG/ML SOLN MEDICATIONS: No additional medications were administered. FLUOROSCOPY TIME:  30 seconds.  8.1 mGy. PROCEDURE: The procedure, risks, benefits, and alternatives were explained to the patient. Questions regarding the procedure were encouraged and answered. The patient understands and consents to the procedure. A time-out was performed prior to initiating the procedure. The right abdominal wall was prepped with chlorhexidine in a sterile fashion, and a sterile drape was applied covering the operative field. A sterile gown and sterile gloves were used for the procedure. Local anesthesia was provided with 1% Lidocaine. Ultrasound image documentation was performed. Fluoroscopy during the procedure and fluoro spot radiograph confirms appropriate catheter position. Ultrasound was utilized to localize the gallbladder. Under direct ultrasound guidance, an 18 gauge trocar needle was advanced into the gallbladder lumen. Aspiration was performed and a bile sample sent for culture studies. A small amount of diluted contrast material was injected. A guide wire was then advanced into the gallbladder. Percutaneous tract dilatation was then performed over a guide wire to 10-French. A 10-French pigtail drainage catheter was then advanced into the gallbladder lumen under fluoroscopy. Catheter was formed and injected with contrast material to confirm position. The catheter was flushed and connected to a gravity drainage bag. It was secured at the skin with a Prolene retention suture and Stat-Lock device. COMPLICATIONS: None FINDINGS: After needle puncture of the gallbladder, a bile  sample was aspirated and sent for culture. Bile return was dark in color with debris. The cholecystostomy tube was advanced into the gallbladder lumen and formed. It is now draining bile. This tube will be left to gravity drainage. IMPRESSION: Percutaneous  cholecystostomy with placement of 10-French drainage catheter into the gallbladder lumen. This was left to gravity drainage. Electronically Signed   By: Aletta Edouard M.D.   On: 01/16/2021 16:17   ECHOCARDIOGRAM COMPLETE  Result Date: 01/20/2021    ECHOCARDIOGRAM REPORT   Patient Name:   Gina Chambers Date of Exam: 01/20/2021 Medical Rec #:  [id number]     Height:       63.0 in Accession #:    [id number]    Weight:       195.1 lb Date of Birth:  [birth year]    BSA:          1.913 m Patient Age:    64 years      BP:           96/56 mmHg Patient Gender: F             HR:           144 bpm. Exam Location:  [facility] Procedure: 2D Echo, Cardiac Doppler and Color Doppler Indications:     Atrial Flutter I48.92  History:         Patient has prior history of Echocardiogram examinations, most                  recent 09/26/2020. CHF; Risk Factors:Diabetes. DVT.                   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  Sonographer:     Sherrie Sport Referring Phys:  [id number] BRENDA MORRISON Diagnosing Phys: Serafina Royals MD IMPRESSIONS  1. Left ventricular ejection fraction, by estimation, is 65 to 70%. The left ventricle has normal function. The left ventricle has no regional wall motion abnormalities. Left ventricular diastolic parameters were normal.  2. Right ventricular systolic function is normal. The right ventricular size is normal.  3. Left atrial size was moderately dilated.  4. The mitral valve has been repaired/replaced. Trivial mitral valve regurgitation. 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 Echo findings are consistent with normal structure and function of the mitral valve prosthesis.  5. The aortic valve is normal in structure. Aortic valve regurgitation is trivial. FINDINGS  Left Ventricle: Left ventricular ejection fraction, by estimation, is 65 to 70%. The left ventricle has normal function. The left ventricle has no regional wall motion  abnormalities. The left ventricular internal cavity size was small. There is no left ventricular hypertrophy. Left ventricular diastolic parameters were normal. Right Ventricle: The right ventricular size is normal. No increase in right ventricular wall thickness. Right ventricular systolic function is normal. Left Atrium: Left atrial size was moderately dilated. Right Atrium: Right atrial size was normal in size. Pericardium: There is no evidence of pericardial effusion. Mitral Valve: The mitral valve has been repaired/replaced. Trivial mitral valve regurgitation. 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 Echo findings are consistent with normal structure and function of the mitral valve prosthesis. MV peak gradient, 13.0 mmHg. The mean mitral valve gradient is 6.0 mmHg. Tricuspid Valve: The tricuspid valve is normal in structure. Tricuspid valve regurgitation is trivial. Aortic  Valve: The aortic valve is normal in structure. Aortic valve regurgitation is trivial. Aortic valve mean gradient measures 17.3 mmHg. Aortic valve peak gradient measures 30.8 mmHg. Aortic valve area, by VTI measures 1.48 cm. Pulmonic Valve: The pulmonic valve was normal in structure. Pulmonic valve regurgitation is not visualized. Aorta: The aortic root and ascending aorta are structurally normal, with no evidence of dilitation. IAS/Shunts: No atrial level shunt detected by color flow Doppler.  LEFT VENTRICLE PLAX 2D LVIDd:         3.50 cm LVIDs:         2.50 cm LV PW:         1.10 cm LV IVS:        1.40 cm LVOT diam:     2.00 cm LV SV:         71 LV SV Index:   37 LVOT Area:     3.14 cm  RIGHT VENTRICLE RV Basal diam:  3.50 cm RV S prime:     14.80 cm/s TAPSE (M-mode): 3.5 cm LEFT ATRIUM             Index       RIGHT ATRIUM           Index LA diam:        2.80 cm 1.46 cm/m  RA Area:     15.60 cm LA Vol (A2C):   90.8 ml 47.45 ml/m RA Volume:   42.60 ml  22.26 ml/m LA Vol (A4C):   78.1 ml 40.82 ml/m LA Biplane  Vol: 84.1 ml 43.95 ml/m  AORTIC VALVE                    PULMONIC VALVE AV Area (Vmax):    1.78 cm     PV Vmax:        0.30 m/s AV Area (Vmean):   1.99 cm     PV Peak grad:   0.4 mmHg AV Area (VTI):     1.48 cm     RVOT Peak grad: 3 mmHg AV Vmax:           277.67 cm/s AV Vmean:          191.333 cm/s AV VTI:            0.478 m AV Peak Grad:      30.8 mmHg AV Mean Grad:      17.3 mmHg LVOT Vmax:         157.00 cm/s LVOT Vmean:        121.000 cm/s LVOT VTI:          0.225 m LVOT/AV VTI ratio: 0.47  AORTA Ao Root diam: 2.00 cm MITRAL VALVE                TRICUSPID VALVE MV Area (PHT): 4.74 cm     TR Peak grad:   39.4 mmHg MV Area VTI:   1.76 cm     TR Vmax:        314.00 cm/s MV Peak grad:  13.0 mmHg MV Mean grad:  6.0 mmHg     SHUNTS MV Vmax:       1.80 m/s     Systemic VTI:  0.22 m MV Vmean:      110.7 cm/s   Systemic Diam: 2.00 cm MV Decel Time: 160 msec MV E velocity: 180.00 cm/s MV A velocity: 88.60 cm/s MV E/A ratio:  2.03 Serafina Royals MD Electronically signed by Serafina Royals MD  Signature Date/Time: 01/20/2021/12:39:30 PM    Final    US ABDOMEN LIMITED RUQ (LIVER/GB)  Result Date: 01/15/2021 CLINICAL DATA:  Right upper quadrant pain since this morning. History of a gallbladder drainage in June 2022. Drain removed in August 2022. EXAM: ULTRASOUND ABDOMEN LIMITED RIGHT UPPER QUADRANT COMPARISON:  Ultrasound, 09/26/2020. FINDINGS: Gallbladder: Gallbladder is distended. There are dependent stones and sludge. Wall thickened to between 4 and 8 mm. Small amount of pericholecystic fluid. Common bile duct: Diameter: 6 mm Liver: No focal lesion identified. Within normal limits in parenchymal echogenicity. Portal vein is patent on color Doppler imaging with normal direction of blood flow towards the liver. Other: Trace perihepatic fluid. IMPRESSION: 1. Distended gallbladder with wall thickening and dependent stones and sludge as well as a small amount of pericholecystic fluid. Findings are similar to the prior  ultrasound and support acute cholecystitis in the proper clinical setting. Electronically Signed   By: Lajean Manes M.D.   On: 01/15/2021 14:13    Microbiology: Recent Results (from the past 240 hour(s))  CULTURE, BLOOD (ROUTINE X 2) w Reflex to ID Panel     Status: None   Collection Time: 01/26/21  8:17 AM   Specimen: BLOOD  Result Value Ref Range Status   Specimen Description BLOOD RIGHT FA  Final   Special Requests   Final    BOTTLES DRAWN AEROBIC AND ANAEROBIC Blood Culture adequate volume   Culture   Final    NO GROWTH 5 DAYS Performed at [facility], [address] [place], [place] [address]    Report Status 01/31/2021 FINAL  Final  CULTURE, BLOOD (ROUTINE X 2) w Reflex to ID Panel     Status: None   Collection Time: 01/26/21  8:17 AM   Specimen: BLOOD RIGHT HAND  Result Value Ref Range Status   Specimen Description BLOOD RIGHT HAND  Final   Special Requests   Final    BOTTLES DRAWN AEROBIC AND ANAEROBIC Blood Culture adequate volume   Culture   Final    NO GROWTH 5 DAYS Performed at [facility], [address] [place], [place] [address]    Report Status 01/31/2021 FINAL  Final     Labs: Basic Metabolic Panel: Recent Labs  Lab 01/25/21 0622 01/27/21 0632 01/29/21 0429  NA 134* 135 135  K 3.6 3.9 3.8  CL 93* 93* 96*  CO2 31 33* 29  GLUCOSE 158* 162* 139*  BUN 15 18 22   CREATININE 0.77 0.87 0.89  CALCIUM 8.3* 8.6* 8.8*   Liver Function Tests: Recent Labs  Lab 01/27/21 0632  AST 40  ALT 32  ALKPHOS 132*  BILITOT 0.7  PROT 6.0*  ALBUMIN 2.3*   No results for input(s): LIPASE, AMYLASE in the last 168 hours. No results for input(s): AMMONIA in the last 168 hours. CBC: Recent Labs  Lab 01/27/21 0632 01/28/21 0805 01/29/21 0429 01/30/21 0912 01/31/21 1114  WBC 19.4* 17.4* 15.9* 12.9* 14.3*  NEUTROABS 11.4* 9.0* 7.6 6.6 7.4  HGB 11.3* 11.1* 11.0* 11.4* 10.4*  HCT 35.0* 33.4* 32.7* 34.1* 31.2*  MCV 87.7 87.4 87.2 88.1 86.2   PLT 259 270 284 260 247   Cardiac Enzymes: No results for input(s): CKTOTAL, CKMB, CKMBINDEX, TROPONINI in the last 168 hours. BNP: BNP (last 3 results) Recent Labs    09/25/20 1112 01/15/21 1251  BNP 629.2* 259.3*    ProBNP (last 3 results) No results for input(s): PROBNP in the last 8760 hours.  CBG: Recent Labs  Lab 01/30/21 1636 01/30/21 2101 01/31/21 0819 01/31/21 1108 01/31/21 1524  GLUCAP 138* 182* 135* 140* 182*    Principal Problem:   Acute cholecystitis Active Problems:   Leukocytosis   Generalized weakness   Acquired hypothyroidism   Essential hypertension   Type 2 diabetes mellitus without complication (HCC)   Hyponatremia   COPD (chronic obstructive pulmonary disease) (HCC)   DVT (deep venous thrombosis) (HCC)   OSA on CPAP   Chronic diastolic CHF (congestive heart failure) (HCC)   Sepsis (HCC)   Right shoulder pain   Paroxysmal atrial flutter ([place])   Time coordinating discharge: 38 minutes  Signed:        Khari Mally, DO [facility]  01/31/2021, 3:50 PM

## 2021-01-31 NOTE — Plan of Care (Signed)

## 2021-01-31 NOTE — Progress Notes (Signed)
Order to discharge pt home.  Discharge instructions/AVS given to patient and reviewed - education provided as needed.  Walker sent home with pt.  Pt advised to call PCP and/or come back to the hospital if there are any problems. Pt verbalized understanding.     Contacted son-in-law Lennette Bihari by phone.  Lennette Bihari will arrive around 5:30-6pm to transport pt home.  Reviewed medications changed with Lennette Bihari and answered questions.

## 2021-01-31 NOTE — TOC Progression Note (Addendum)
Transition of Care [facility]) - Progression Note    Patient Details  Name: Gina Chambers MRN: [id number] Date of Birth: 1940/10/12  Transition of Care [facility]) CM/SW [place], LCSW Phone Number: 01/31/2021, 1:31 PM  Clinical Narrative:  CSW spoke with pt's daughter via telephone to explain of PT's recs of [facility] as of yesterday. Pt's daughter still wants SNF. Pt is walking 700 ft. CSW also explained that [facility] is not a appealing insurance for [facility] agencies that [place] would send out to see if anyone can service pt. CSW asked if pt could do outpatient--pt's daughter became frustrated and states "pt can not come home without services."   CSW waiting to hear back from [facility] agencies.  [facility], [facility], [facility], and [facility] can not service.      Expected Discharge Plan and Services                                                 Social Determinants of Health (SDOH) Interventions    Readmission Risk Interventions No flowsheet data found.

## 2021-01-31 NOTE — Progress Notes (Signed)
PROGRESS NOTE  Gina Chambers OIN:[id number] DOB: 12-21-1940 DOA: 01/15/2021 PCP: Kirk Ruths, MD  Brief History   80 y.o. female with medical history significant of hypertension, diabetes mellitus, COPD, asthma, GERD, hypothyroidism, OSA on CPAP, DVT on Xarelto, dCHF, vertigo, GI bleeding, COVID-19 infection, cholecystitis admitted for worsening abdominal pain.  She was diagnosed with acute cholecystitis with cholelithiasis.  Cholecystostomy tube was placed on 9/26, was on IV Abx and switched to PO Augmentin on 10/3   Patient developed atrial flutter with rapid ventricular response 9/30, seen by cardiology, given diltiazem, Toprol and digoxin. Heart rate is much better controlled. Cardiology has recommended cardiac rehab. No further cardiac testing is anticipated.  The patient has decided that she is not interested in hospice. Plan is for her to discharge to home with home health services. Dr. Allyne Gee has spoken with the patient. Once the drain is out/has been in for a minimum of 6-8 weeks, she will follow up with him as outpatient and consideration will be given to cholecystectomy in the future.   Late this afternoon I was called back to the floor to talk again with the patient's daughter and son-in-law regarding her disposition. Palliative care nurse Flo Shanks joined me in the patient's room for this talk. First, I explained to the patient and family that despite the statement made by nursing that the patient was being discharged today, I had not discharged the patient. I and Santiago Glad explained to them that the patient is not appropriate for hospice. I explained that we could have the patient re-evaluated by PT with an eye to getting her into SNF. Unfortunately/fortunately the patient is ambulating 700 feet and does not require SNF. For this reason the patient will now be discharged to home with home health.   Plan was for patient to discharge to home with home health when [facility] can be  arranged. Again, unfortunately, due to the patient's insurance, several home health agencies are not able to service the patient. This information was relayed to the patient's daughter by TOC. The patient's daughter apparently became irate stating that the patient could not come home without services. She also would not consider outpatient PT/OT.   Consultants  Cardiology Palliative Care General surgery Interventional radiology  Procedures  Cholecystostomy tube placement 01/16/2021  Antibiotics   Anti-infectives (From admission, onward)    Start     Dose/Rate Route Frequency Ordered Stop   01/23/21 2200  amoxicillin-clavulanate (AUGMENTIN) 875-125 MG per tablet 1 tablet        1 tablet Oral Every 12 hours 01/23/21 1146 01/29/21 2206   01/20/21 1600  Ampicillin-Sulbactam (UNASYN) 3 g in sodium chloride 0.9 % 100 mL IVPB        3 g 200 mL/hr over 30 Minutes Intravenous Every 6 hours 01/20/21 1457 01/23/21 1706   01/15/21 2200  piperacillin-tazobactam (ZOSYN) IVPB 3.375 g  Status:  Discontinued        3.375 g 12.5 mL/hr over 240 Minutes Intravenous Every 8 hours 01/15/21 1626 01/20/21 1457   01/15/21 1545  piperacillin-tazobactam (ZOSYN) IVPB 3.375 g        3.375 g 100 mL/hr over 30 Minutes Intravenous  Once 01/15/21 1543 01/15/21 1715      Subjective  The patient is resting comfortably. No new complaints.   Objective   Vitals:  Vitals:   01/31/21 0817 01/31/21 1107  BP: (!) 108/50 (!) 103/49  Pulse: 77 74  Resp: 20 16  Temp: 97.9 F (36.6 C) 98.1 F (  36.7 C)  SpO2: 97% 96%    Exam:  Constitutional:  Awake, alert, and oriented x 3. No acute distress. Respiratory:  CTA bilaterally, no w/r/r.  Respiratory effort normal. No retractions or accessory muscle use Cardiovascular:  RRR, no m/r/g No LE extremity edema   Normal pedal pulses Abdomen:  Abdomen appears normal; no tenderness or masses No hernias No HSM Musculoskeletal:  Digits/nails BUE: no clubbing,  cyanosis, petechiae, infection exam of joints, bones, muscles of at least one of following: head/neck, RUE, LUE, RLE, LLE   Skin:  No rashes, lesions, ulcers palpation of skin: no induration or nodules Neurologic:  CN 2-12 intact Sensation all 4 extremities intact Psychiatric:  Mental status: Depressed Mood, affect appropriate Orientation to person, place, time   I have personally reviewed the following:   Today's Data  Vitals  Lab Data  CBC   Imaging  CT abdomen and pelvis  Cardiology Data  EKG Echocardiogram  Scheduled Meds:  aspirin EC  81 mg Oral QHS   diltiazem  180 mg Oral BID   fluticasone  2 puff Inhalation BID   gabapentin  100 mg Oral BID   gabapentin  200 mg Oral QHS   insulin aspart  0-5 Units Subcutaneous QHS   insulin aspart  0-9 Units Subcutaneous TID WC   insulin glargine-yfgn  10 Units Subcutaneous Daily   And   insulin glargine-yfgn  5 Units Subcutaneous QHS   levothyroxine  125 mcg Oral Q0600   metoprolol succinate  50 mg Oral Daily   montelukast  10 mg Oral QHS   multivitamin with minerals   Oral Daily   pantoprazole  20 mg Oral Daily   rivaroxaban  20 mg Oral Q supper   sodium chloride flush  5 mL Intracatheter Q8H   spironolactone  25 mg Oral Daily   torsemide  40 mg Oral BID   Continuous Infusions:  sodium chloride      Principal Problem:   Acute cholecystitis Active Problems:   Leukocytosis   Generalized weakness   Acquired hypothyroidism   Essential hypertension   Type 2 diabetes mellitus without complication (HCC)   Hyponatremia   COPD (chronic obstructive pulmonary disease) (HCC)   DVT (deep venous thrombosis) (HCC)   OSA on CPAP   Chronic diastolic CHF (congestive heart failure) (HCC)   Sepsis (HCC)   Right shoulder pain   Paroxysmal atrial flutter ([place])   LOS: 16 days   [facility] Problems * Acute cholecystitis S/p IR guided cholecystostomy tube on 9/26.  Tube will need to stay in place for 6 to 8 weeks per  surgery.  On oral Augmentin. WBC decreasing. Dr Allyne Gee discussed cholecystectomy with the patient in 6-8 weeks when her tube is removed, she will see him as outpatient and be evaluated for possible cholecystectomy.   Paroxysmal atrial flutter (HCC) Patient's heart rate at rest mostly between 40/s to 80's. With any exertion, however it increases to the 120's-130's. Likely due to deconditioning.  Cardiology following. Continue Cardizem CD 180 mg twice a day.  Blood pressure will not likely tolerate a higher dose. Continue metoprolol (increased to 50 mg today) and Xarelto. Cardiology has recommended cardiac rehab for patient upon discharge.    Acquired hypothyroidism Continue Synthroid   Generalized weakness Improving. Multifactorial.  PT and OT recommend Home with home health now.  TOC aware and working on arrangements.   Leukocytosis Up slightly today.. Monitor.    I have seen and examined this patient  myself. I have spent 30 minutes in her evaluation and care.   Rosalba Totty, DO [facility] Direct contact: see [email or web address]  7PM-7AM contact night coverage as above 01/31/2021, 3:00 PM  LOS: 10 days

## 2021-01-31 NOTE — TOC Transition Note (Addendum)
Transition of Care [facility]) - CM/SW Discharge Note   Patient Details  Name: Gina Chambers MRN: [id number] Date of Birth: [birth year]  Transition of Care [facility]) CM/SW Contact:  Eileen [place], LCSW Phone Number: 01/31/2021, 3:09 PM   Clinical Narrative:   [facility] will service pt at home. MD states pt will dc today.CSW called pt's daughter to notify however, no answer.  Spoke with Son In [place] he is aware of [place] and pt's dc. Pt's son in law states a walker was delivered to the room already.   Final next level of care: Home w Home Health Services Barriers to Discharge: No Barriers Identified   Patient Goals and CMS Choice        Discharge Placement                    Patient and family notified of of transfer: 01/31/21  Discharge Plan and Services                          HH Arranged: PT [facility] Agency: [facility] Date [place]: 01/31/21 Time [place]: 1508 Representative spoke with at [place]: alex  Social Determinants of Health ([place]) Interventions     Readmission Risk Interventions No flowsheet data found.

## 2021-02-02 ENCOUNTER — Telehealth: Payer: Self-pay | Admitting: Primary Care

## 2021-02-02 NOTE — Telephone Encounter (Signed)
Attempted to contact patient's daughter, Peggyann Shoals to offer to schedule a Palliative Consult, no answer - left message with reason for call along with my name and call back number.

## 2021-02-08 ENCOUNTER — Telehealth: Payer: Self-pay | Admitting: Primary Care

## 2021-02-08 NOTE — Telephone Encounter (Signed)
Spoke with patient's daughter Peggyann Shoals, regarding the Palliative referral/services and all questions were answered and she was in agreement with scheduling visit.  I have scheduled an In-home Consult for 02/16/21 @ 9:30 AM

## 2021-02-10 ENCOUNTER — Encounter: Payer: Self-pay | Admitting: General Surgery

## 2021-02-14 ENCOUNTER — Telehealth: Payer: Self-pay

## 2021-02-14 NOTE — Telephone Encounter (Signed)
Spoke with daughter to reschedule palliative consult for Friday 10/28 at 1 pm

## 2021-02-16 ENCOUNTER — Other Ambulatory Visit: Payer: Medicare Other | Admitting: Primary Care

## 2021-02-17 ENCOUNTER — Other Ambulatory Visit: Payer: Self-pay

## 2021-02-17 ENCOUNTER — Other Ambulatory Visit: Payer: Medicare Other | Admitting: Primary Care

## 2021-02-17 DIAGNOSIS — K810 Acute cholecystitis: Secondary | ICD-10-CM

## 2021-02-17 DIAGNOSIS — E119 Type 2 diabetes mellitus without complications: Secondary | ICD-10-CM

## 2021-02-17 DIAGNOSIS — I5030 Unspecified diastolic (congestive) heart failure: Secondary | ICD-10-CM

## 2021-02-17 DIAGNOSIS — J449 Chronic obstructive pulmonary disease, unspecified: Secondary | ICD-10-CM

## 2021-02-17 NOTE — Progress Notes (Signed)
[facility] Consult Note Telephone: [phone]  Fax: [phone]   Date of encounter: 02/17/21 2:32 PM PATIENT NAME: Gina Chambers [address] [place] [address]   [phone] (home)  DOB: 06-Jan-1941 MRN: [id number] PRIMARY CARE PROVIDER:    Kirk Ruths, MD,  [address] [place] [address] [phone]  REFERRING PROVIDER:   Kirk Ruths, MD [facility] [place],  [place] [address] [phone]  RESPONSIBLE PARTY:    Contact Information     Name Relation Home Work Mobile   [place] Daughter [phone]     Gina Chambers   [phone]        I met face to face with patient in  home. Palliative Care was asked to follow this patient by consultation request of  Gina Ruths, MD to address advance care planning and complex medical decision making. This is the initial visit.                                     ASSESSMENT AND PLAN / RECOMMENDATIONS:   Advance Care Planning/Goals of Care: Goals include to maximize quality of life and symptom management. Patient/health care surrogate gave his/her permission to discuss.Our advance care planning conversation included a discussion about:    The value and importance of advance care planning   Exploration of personal, cultural or spiritual beliefs that might influence medical decisions  Exploration of goals of care in the event of a sudden injury or illness  Identification  of a healthcare agent -daughter  creation of an  advance directive document - DNR form today Decision not to resuscitate  due to poor prognosis. CODE STATUS: DNR MOST given, she will review and we will discuss on next visit.  I spent 20 minutes providing this consultation. More than 50% of the time in this consultation was spent in counseling and care  coordination. _____________________________________________________________________  Symptom Management/Plan:  I met with patient today for the first time in her home. She recounts her illness which started in the summer with placement of a cholecystotomy drain. This was to remain in several months but  was removed at her request in August. Her infection re- occurred and a drain was inserted again in late September. She's now been home from the hospital for two weeks with home health.  She also had covid infection which she states worsened her course.   Gall bladder infection/Drain care: She's receiving physical therapy and states a nurse visited once but did not give her instructions on the drain. I have spoken with Dr. Lillard Anes PA [facility] and obtained orders which I've sent to home health agency for site care. She will also follow up with IR regarding further treatment of her gallbladder infection. I have provided education for her self care of drain tube.  Nutrition she endorses pain when eating solid food but is able to drink ensure type drinks for proteins. She endorses her weight currently at 167 lbs which is some loss but she also endorses a better control of her edema.  Albumin normal at 3.6.  Congestive failure today her lungs are clear and her heart rate is regular, S1 S2, slight pedal edema, the left greater than the right. She is able to ambulate per her report in the home freely and endorses fatigue  from deconditioning but does not seem to have additional fatigue from worsening underlying heart failure .   Mood she endorses much discouragement and frustration with her many medical issues. We discussed her do not resuscitate order which she would like to have drawn up today. She states when it's her time she will go although she is still interested in life if she can feel better. She may benefit from antidepressant therapy at least for the duration of her chronic situation.   Coordination  of care I've contacted her home health agency who did not have orders for her G.I. care, site care and tube flush. I would recommend medication management from [facility] as she does seem confused about her meds and teaching of site care for her  drain.  She is being seen also for therapy in the home. [phone] fax to [facility]  Follow up Palliative Care Visit: Palliative care will continue to follow for complex medical decision making, advance care planning, and clarification of goals. Return 2-4 weeks or prn.  This visit was coded based on medical decision making (MDM).  PPS: 50%  HOSPICE ELIGIBILITY/DIAGNOSIS: no  Chief Complaint: debility  HISTORY OF PRESENT ILLNESS:  Gina Chambers is a 80 y.o. year old female  with CHF, COPD, cholecystitis, cholecycotomy tube. She presents  with debility   from hospital and illness course. She endorses poor appetite and stamina although this is returning. She denies pain At rest ,  She states some with eating.  Her caregiver is away today and not present at our visit. She endorses frustration with her illness. She voices desire to get better and have drain removed and resolve her illness.  History obtained from review of EMR, discussion with primary team, and interview with family, facility staff/caregiver and/or Gina Chambers.  I reviewed available labs, medications, imaging, studies and related documents from the EMR.  Records reviewed and summarized above.   ROS   General: NAD ENMT: denies dysphagia Cardiovascular: denies chest pain, denies DOE Pulmonary: denies cough, denies increased SOB Abdomen: endorses  poor appetite, denies constipation, endorses continence of bowel GU: denies dysuria, endorses continence of urine MSK: endorse weakness,  no falls reported Skin: denies rashes , + insertion site of cholecystotomy tube Neurological: denies pain, denies insomnia Psych: Endorses  depressed mood, endorses need for organization Heme/lymph/immuno:  denies bruises, abnormal bleeding  Physical Exam: Current and past weights: 167 lbs Constitutional: NAD General: frail appearing, wnwd EYES: anicteric sclera, lids intact, no discharge  ENMT: intact hearing, oral mucous membranes moist, dentition intact CV: S1S2, RRR, 1+ bil LE edema Pulmonary: LCTA, no increased work of breathing, no cough, room air Abdomen: intake 50%,  no ascites GU: deferred MSK: no sarcopenia, moves all extremities, ambulatory with walker Skin: warm and dry,  insertion site obscured by dressing, no pain on palpation Neuro:  + generalized weakness,  mild cognitive impairment Psych: anxious affect, A and O x 3 Hem/lymph/immuno: no widespread bruising CURRENT PROBLEM LIST:  Patient Active Problem List   Diagnosis Date Noted   Paroxysmal atrial flutter ([place]) 01/20/2021   Acute cholecystitis 01/15/2021   Right shoulder pain 01/15/2021   COPD (chronic obstructive pulmonary disease) (HCC)    DVT (deep venous thrombosis) (HCC)    OSA on CPAP    Chronic diastolic CHF (congestive heart failure) (HCC)    Sepsis ([place])    Inguinal abscess    Anticoagulated    COVID-19 virus infection 09/24/2020   Type 2 diabetes mellitus without complication ([place])  09/24/2020   Hyponatremia 09/24/2020   Cholecystitis 09/23/2020   Chest pain 12/24/2018   Hypoglycemia 11/07/2017   GIB (gastrointestinal bleeding) 11/07/2016   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) ([place]) 06/19/2016   COPD exacerbation ([place]) 05/12/2016   Community acquired pneumonia 05/12/2016   Leukocytosis 05/12/2016   Generalized weakness 05/12/2016   Acquired hypothyroidism 12/15/2015   Essential hypertension 12/15/2015   PAST MEDICAL HISTORY:  Active Ambulatory Problems    Diagnosis Date Noted   COPD exacerbation ([place]) 05/12/2016   Community acquired pneumonia 05/12/2016   Leukocytosis 05/12/2016   Generalized weakness 05/12/2016   GIB (gastrointestinal bleeding) 11/07/2016    Hypoglycemia 11/07/2017   Chest pain 12/24/2018   Acquired hypothyroidism 12/15/2015   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) ([place]) 06/19/2016   Essential hypertension 12/15/2015   Cholecystitis 09/23/2020   COVID-19 virus infection 09/24/2020   Type 2 diabetes mellitus without complication ([place]) 26/71/2458   Hyponatremia 09/24/2020   Inguinal abscess    Anticoagulated    Acute cholecystitis 01/15/2021   COPD (chronic obstructive pulmonary disease) (HCC)    DVT (deep venous thrombosis) (HCC)    OSA on CPAP    Chronic diastolic CHF (congestive heart failure) (HCC)    Sepsis ([place])    Right shoulder pain 01/15/2021   Paroxysmal atrial flutter ([place]) 01/20/2021   Resolved Ambulatory Problems    Diagnosis Date Noted   No Resolved Ambulatory Problems   Past Medical History:  Diagnosis Date   Asthma    Asthmatic bronchitis , chronic (HCC)    CHF (congestive heart failure) (HCC)    Diabetes mellitus without complication ([facility])    Family history of adverse reaction to anesthesia    GERD (gastroesophageal reflux disease)    Hypertension    Neuropathy    PONV (postoperative nausea and vomiting)    Sleep apnea    Thyroid disease    Vertigo    SOCIAL HX:  Social History   Tobacco Use   Smoking status: Never   Smokeless tobacco: Never  Substance Use Topics   Alcohol use: No   FAMILY HX:  Family History  Problem Relation Age of Onset   Breast cancer Mother        38's   Breast cancer Maternal Grandmother    Breast cancer Paternal Grandmother       ALLERGIES:  Allergies  Allergen Reactions   Ropinirole Nausea And Vomiting   Azelastine Other (See Comments)    Closes airways  Closes airways     Bupropion     GI issues    Calcium     Chest pain    Carbidopa-Levodopa Other (See Comments)    Patient reports chest pain, back pain and left side pain   Cefuroxime     Swelling    Cephalexin    Clinoril [Sulindac]     GI   Codeine Hives    Duloxetine     Bleeding    Duloxetine Hcl Other (See Comments)    Bleeding   Ezetimibe     Joint pain    Fenofibrate     Leg cramps   Furosemide     Fluid retention    Glipizide     Bloating    Lorazepam     SI   Lortab [Hydrocodone-Acetaminophen] Hives   Metaproterenol     Palpitations    Morphine And Related    Nalbuphine     Rapid heart rate  flushing  Naproxen     Numbness    Norfloxacin     Urinary retention    Rofecoxib    Sodium    Statins     Muscle pain    Sulfa Antibiotics     Unknown    Sulfasalazine Other (See Comments)    Unknown   Tramadol    Trazodone And Nefazodone    Doxycycline Rash   Iodine Rash   Ketoprofen Rash   Piroxicam Rash   Tolmetin Rash     PERTINENT MEDICATIONS:  Outpatient Encounter Medications as of 02/17/2021  Medication Sig   aspirin EC 81 MG tablet Take 81 mg by mouth at bedtime.   bumetanide (BUMEX) 0.5 MG tablet Take 1 mg by mouth 2 (two) times daily.   clobetasol cream (TEMOVATE) 8.76 % Apply 1 application topically 2 (two) times daily.   diltiazem (CARDIZEM CD) 180 MG 24 hr capsule Take 1 capsule (180 mg total) by mouth 2 (two) times daily.   diphenhydrAMINE (BENADRYL) 25 mg capsule Take 25 mg by mouth every 6 (six) hours as needed.   fluticasone (FLOVENT HFA) 110 MCG/ACT inhaler Inhale 2 puffs into the lungs 2 (two) times daily.   gabapentin (NEURONTIN) 100 MG capsule Take 1 capsule (100 mg total) by mouth 2 (two) times daily.   gabapentin (NEURONTIN) 100 MG capsule Take 2 capsules (200 mg total) by mouth at bedtime.   ibuprofen (ADVIL) 600 MG tablet Take 1 tablet (600 mg total) by mouth every 6 (six) hours as needed for headache or moderate pain (Severe pain).   insulin glargine (LANTUS) 100 UNIT/ML injection Inject 10-20 Units into the skin See admin instructions. Inject 20u under the skin every morning and inject 10u under the skin ever night at bedtime   insulin regular (NOVOLIN R,HUMULIN R) 250 units/2.66mL (100  units/mL) injection Inject 10 Units into the skin 3 (three) times daily before meals.   Ipratropium-Albuterol (COMBIVENT) 20-100 MCG/ACT AERS respimat Inhale 1 puff into the lungs 4 (four) times daily as needed for wheezing or shortness of breath.   lansoprazole (PREVACID) 30 MG capsule Take 30 mg by mouth 2 (two) times daily.   levalbuterol (XOPENEX) 0.63 MG/3ML nebulizer solution Inhale 0.63 mg into the lungs every 6 (six) hours as needed.   levothyroxine (SYNTHROID, LEVOTHROID) 125 MCG tablet Take 125 mcg by mouth daily.   metoprolol succinate (TOPROL-XL) 50 MG 24 hr tablet Take 50 mg by mouth daily. Take with or immediately following a meal.   mirabegron ER (MYRBETRIQ) 25 MG TB24 tablet Take 25 mg by mouth daily.   montelukast (SINGULAIR) 10 MG tablet Take 10 mg by mouth at bedtime.   Multiple Vitamins-Minerals (CENTRUM SILVER PO) Take 1 tablet by mouth daily.   Multiple Vitamins-Minerals (PRESERVISION AREDS 2 PO) Take 1 tablet by mouth 2 (two) times daily.   rivaroxaban (XARELTO) 20 MG TABS tablet Take 20 mg by mouth daily.   spironolactone (ALDACTONE) 25 MG tablet Take 1 tablet (25 mg total) by mouth daily.   torsemide 40 MG TABS Take 40 mg by mouth 2 (two) times daily.   No facility-administered encounter medications on file as of 02/17/2021.   Thank you for the opportunity to participate in the care of Ms. Boshers.  The palliative care team will continue to follow. Please call our office at [phone] if we can be of additional assistance.   Jason Coop, NP , DNP, AGPCNP-BC  COVID-19 PATIENT SCREENING TOOL Asked and negative response unless otherwise noted:  Have you had symptoms of covid, tested positive or been in contact with someone with symptoms/positive test in the past 5-10 days?

## 2021-02-28 ENCOUNTER — Other Ambulatory Visit: Payer: Medicare Other | Admitting: Primary Care

## 2021-02-28 ENCOUNTER — Other Ambulatory Visit: Payer: Self-pay

## 2021-02-28 DIAGNOSIS — Z515 Encounter for palliative care: Secondary | ICD-10-CM

## 2021-02-28 DIAGNOSIS — J449 Chronic obstructive pulmonary disease, unspecified: Secondary | ICD-10-CM

## 2021-02-28 DIAGNOSIS — E119 Type 2 diabetes mellitus without complications: Secondary | ICD-10-CM

## 2021-02-28 DIAGNOSIS — Z794 Long term (current) use of insulin: Secondary | ICD-10-CM

## 2021-02-28 DIAGNOSIS — I5030 Unspecified diastolic (congestive) heart failure: Secondary | ICD-10-CM

## 2021-02-28 DIAGNOSIS — K810 Acute cholecystitis: Secondary | ICD-10-CM

## 2021-02-28 NOTE — Progress Notes (Signed)
[facility] Consult Note Telephone: [phone]  Fax: [phone]    Date of encounter: 02/28/21 1:35 PM PATIENT NAME: FIDELIS LOTH [address] [place] [address]   [phone] (home)  DOB: 05-Feb-1941 MRN: [id number] PRIMARY CARE PROVIDER:    Kirk Ruths, MD,  [place] [address] [phone]  REFERRING PROVIDER:   Kirk Ruths, MD [facility] [place],  [place] [address] [phone]  RESPONSIBLE PARTY:    Contact Information     Name Relation Home Work Mobile   [place] Daughter [phone]     Jetty Peeks   [phone]        I met face to face with patient and family in home. Palliative Care was asked to follow this patient by consultation request of  Kirk Ruths, MD to address advance care planning and complex medical decision making. This is a follow up visit.                                   ASSESSMENT AND PLAN / RECOMMENDATIONS:   Advance Care Planning/Goals of Care: Goals include to maximize quality of life and symptom management. Our advance care planning conversation included a discussion about:    Exploration of personal, cultural or spiritual beliefs that might influence medical decisions  Exploration of goals of care in the event of a sudden injury or illness  CODE STATUS:  DNR  Symptom Management/Plan:  CholeDrain: Home health has received drain care orders and they are now providing this care. Discussed drainage and need to flush only if sluggish per IR PA.  It does have sludge. But if freely flowing she should just monitor. States there was some blood but this resolved.   Mobility: Ladon Applebaum ' doing laps" but daughter disagrees. She can move in home with walker.  Pain; Complaints have resolved from last week. States occ discomfort in drain insertion area but denies acute  changes.  Tx plan: Discussed with family phone call to IR office, that she will be scheduled for f/u. Daughter states they have a surgery consult appt this week.   Nutrition: Taking atkins diet supplements with limited carbs. Feels she's lost some weight due to poor intake.   Follow up Palliative Care Visit: Palliative care will continue to follow for complex medical decision making, advance care planning, and clarification of goals. Return 4 weeks or prn.  I spent 60 minutes providing this consultation. More than 50% of the time in this consultation was spent in counseling and care coordination.  PPS: 50%  HOSPICE ELIGIBILITY/DIAGNOSIS: no  Chief Complaint: debility  HISTORY OF PRESENT ILLNESS:  MISTI TOWLE is a 80 y.o. year old female  with DM, CHF, cholecystitis, chole drain .   History obtained from review of EMR, discussion with primary team, and interview with family, facility staff/caregiver and/or Ms. Want.  I reviewed available labs, medications, imaging, studies and related documents from the EMR.  Records reviewed and summarized above.   ROS  General: NAD EYES: denies vision changes ENMT: denies dysphagia Cardiovascular: denies chest pain, denies DOE Pulmonary: occ cough, denies increased SOB Abdomen: endorses fair appetite, denies constipation, endorses continence of bowel GU: denies dysuria, endorses continence of urine MSK:  endorses weakness,  no falls reported Skin: denies rashes or wounds Neurological: reports  pain at  insertion point of cholesysostomy drain, endorses  insomnia Psych: Endorses depressed  mood, endorses history of med allergies  Heme/lymph/immuno: denies bruises, abnormal bleeding  Physical Exam: Current and past weights: 160 lbs to 162 lb Constitutional: NAD General: frail appearing, obese  EYES: anicteric sclera, lids intact, no discharge  ENMT: intact hearing, oral mucous membranes moist, dentition intact CV: S1S2, RRR, 1+LE  edema Pulmonary: LCTA, no increased work of breathing, no cough, room air Abdomen: intake 75%, normo-active BS + 4 quadrants, soft and non tender, no ascites GU: deferred MSK: no sarcopenia, moves all extremities, ambulatory with walker  Skin: warm and dry, no rashes or wounds on visible skin Neuro:  + generalized weakness Psych: anxious affect, A and O x 3 Hem/lymph/immun: no widespread bruising   Thank you for the opportunity to participate in the care of Ms. Jeangilles.  The palliative care team will continue to follow. Please call our office at [phone] if we can be of additional assistance.   Jason Coop, NP DNP, AGPCNP-BC  COVID-19 PATIENT SCREENING TOOL Asked and negative response unless otherwise noted:   Have you had symptoms of covid, tested positive or been in contact with someone with symptoms/positive test in the past 5-10 days?

## 2021-03-28 ENCOUNTER — Other Ambulatory Visit: Payer: Medicare Other | Admitting: Primary Care

## 2021-03-28 ENCOUNTER — Other Ambulatory Visit: Payer: Self-pay

## 2021-03-28 DIAGNOSIS — K810 Acute cholecystitis: Secondary | ICD-10-CM

## 2021-03-28 DIAGNOSIS — Z515 Encounter for palliative care: Secondary | ICD-10-CM

## 2021-03-28 DIAGNOSIS — E119 Type 2 diabetes mellitus without complications: Secondary | ICD-10-CM

## 2021-03-28 NOTE — Progress Notes (Signed)
[facility] Consult Note Telephone: [phone]  Fax: [phone]    Date of encounter: 03/28/21 12:57 PM PATIENT NAME: Gina Chambers [address] [place] [address]   [phone] (home)  DOB: 09-27-1940 MRN: [id number] PRIMARY CARE PROVIDER:    Kirk Ruths, MD,  [place] [address] [phone]  REFERRING PROVIDER:   Kirk Ruths, MD [facility] [place],  [place] [address] [phone]  RESPONSIBLE PARTY:    Contact Information     Name Relation Home Work Mobile   Gina Chambers Daughter [phone]     Gina Chambers   [phone]       I met face to face with patient in home. Palliative Care was asked to follow this patient by consultation request of  Kirk Ruths, MD to address advance care planning and complex medical decision making. This is a follow up visit.                                   ASSESSMENT AND PLAN / RECOMMENDATIONS:   Advance Care Planning/Goals of Care: Goals include to maximize quality of life and symptom management. Our advance care planning conversation included a discussion about:    The value and importance of advance care planning  Experiences with loved ones who have been seriously ill or have died  Exploration of personal, cultural or spiritual beliefs that might influence medical decisions  Exploration of goals of care in the event of a sudden injury or illness  Identification of a healthcare agent - daughter Review of an  advance directive document - Went over a MOST form and some of her wishes for ACP. She shared her thoughts on these choices. She will talk with daughter and we'll review next visit. Daughter arrived as I was departing, plan to talk about ACP next meeting to include her as well.  CODE STATUS: DNR   Symptom Management/Plan:  Cholecystostomy: Still has a tube  in,  endorses pain after eating. 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 States she has appt with hepatologist and will then see the surgeon and is hoping for removal in early January. She is managing drain fine for now. She reports no home health visit from RN since before Thanksgiving.   Nutrition:  Discussed diabetic diet and weight gain on insulin. May seek an endocrine consult to dry to decrease insulin and consider other oral agents.  She has lost weight from illness but we discussed healthy eating choices and the weight gain that insulin brings.   Depression: Endorses but refuses any medications. 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 She is averse to any medications for mood now.  Follow up Palliative Care Visit: Palliative care will continue to follow for complex medical decision making, advance care planning, and clarification of goals. Return 7 weeks or prn.  I spent 60  minutes providing this consultation. More than 50% of the time in this consultation was spent in counseling and care coordination.  PPS: 50%  HOSPICE ELIGIBILITY/DIAGNOSIS: no  Chief Complaint: abdominal pain  HISTORY OF PRESENT ILLNESS:  Gina Chambers is a 80 y.o. year old female  with NASH, IDDM, cholelithiasis and drain tube.  History obtained from review of EMR, discussion with primary team, and interview with  family, facility staff/caregiver and/or Gina Chambers.  I reviewed available labs, medications, imaging, studies and related documents from the EMR.  Records reviewed and summarized above.   ROS  General: NAD EYES: denies vision changes ENMT: denies dysphagia Cardiovascular: denies chest pain, endorses slight DOE Pulmonary: denies cough, denies increased SOB at rest  Abdomen: endorses fair to good appetite,  endorses pain with eating, drainage tube, denies constipation, endorses continence of bowel GU: denies dysuria, endorses continence of urine MSK:   endorses improving weakness,  no falls reported Skin: denies rashes or wounds Neurological: endorses neuropathic pain, endorses insomnia Psych: Endorses positive mood, endorses past depression Heme/lymph/immuno: denies bruises, abnormal bleeding  Physical Exam: Current and past weights: stable  Constitutional: NAD General: frail appearing, obese  EYES: anicteric sclera, lids intact, no discharge  ENMT: intact hearing, oral mucous membranes moist, dentition intact Pulmonary:no increased work of breathing, no cough, room air Abdomen: intake 75%, no ascites GU: deferred MSK: mild  sarcopenia, moves all extremities, ambulatory Skin: warm and dry, no rashes or wounds on visible skin Neuro:  mild  generalized weakness,  no cognitive impairment Psych: slight anxious affect, A and O x 3 Hem/lymph/immuno: no widespread bruising  Thank you for the opportunity to participate in the care of Gina Chambers.  The palliative care team will continue to follow. Please call our office at [phone] if we can be of additional assistance.   Gina Coop, NP DNP, AGPCNP-BC  COVID-19 PATIENT SCREENING TOOL Asked and negative response unless otherwise noted:   Have you had symptoms of covid, tested positive or been in contact with someone with symptoms/positive test in the past 5-10 days?

## 2021-05-16 ENCOUNTER — Other Ambulatory Visit: Payer: Medicare Other | Admitting: Primary Care

## 2021-05-16 ENCOUNTER — Other Ambulatory Visit: Payer: Self-pay

## 2021-05-16 DIAGNOSIS — R531 Weakness: Secondary | ICD-10-CM

## 2021-05-16 DIAGNOSIS — Z515 Encounter for palliative care: Secondary | ICD-10-CM

## 2021-05-16 DIAGNOSIS — J441 Chronic obstructive pulmonary disease with (acute) exacerbation: Secondary | ICD-10-CM

## 2021-05-16 DIAGNOSIS — I5030 Unspecified diastolic (congestive) heart failure: Secondary | ICD-10-CM

## 2021-05-16 DIAGNOSIS — K819 Cholecystitis, unspecified: Secondary | ICD-10-CM

## 2021-05-16 NOTE — Progress Notes (Signed)
[facility] Consult Note Telephone: [phone]  Fax: [phone]    Date of encounter: 05/16/21 1:37 PM PATIENT NAME: Gina Chambers [address] [place] [address]   [phone] (home)  DOB: August 01, 1940 MRN: [id number] PRIMARY CARE PROVIDER:    Kirk Ruths, MD,  [place] [address] [phone]  REFERRING PROVIDER:   Kirk Ruths, MD [facility] [place],  [place] [address] [phone]  RESPONSIBLE PARTY:    Contact Information     Name Relation Home Work Mobile   [place] Daughter [phone]     Jetty Peeks   [phone]        I met face to face with patient and family in  home. Palliative Care was asked to follow this patient by consultation request of  Kirk Ruths, MD to address advance care planning and complex medical decision making. This is a follow up visit.                                   ASSESSMENT AND PLAN / RECOMMENDATIONS:   Advance Care Planning/Goals of Care: Goals include to maximize quality of life and symptom management. Patient/health care surrogate gave his/her permission to discuss.Our advance care planning conversation included a discussion about:    Exploration of personal, cultural or spiritual beliefs that might influence medical decisions   Symptom Management/Plan:  Cholelithiasis; Had her cholecystectomy 2 days ago, and  is just in from that. Had been increasing her exercise for surgical prep. Feels she will make a good recovery. Instructed to remove scopolamine patch from R ear area on 1/27. Pain: has oxycodone 5 mg q 4 hrs prn pain s/p surgery. Using with ATC acetaminophen cr 650 mg . Instructed bowel plan. Abdomen intact with glued lap sites. S/p chole tube which has occlusive dressing.Non tender. Nutrition: eating at baseline, some nausea today. Taking ondansetron 4  mg prn for that. Hydrating well.  Follow up Palliative Care Visit: Palliative care will continue to follow for complex medical decision making, advance care planning, and clarification of goals. Return 3-4 months or prn.  This visit was coded based on medical decision making (MDM).  PPS: 50%  HOSPICE ELIGIBILITY/DIAGNOSIS: no  Chief Complaint: s/p cholecystectomy  HISTORY OF PRESENT ILLNESS:  Gina Chambers is a 81 y.o. year old female  with longstanding chole drain and was waiting for removal. Surgery was moved form earlier in month to  05/15/21 She has done well post op. Has expected post op pain,some nausea.  History obtained from review of EMR, discussion with primary team, and interview with family, facility staff/caregiver and/or [name].  I reviewed available labs, medications, imaging, studies and related documents from the EMR.  Records reviewed and summarized above.   ROS  General: NAD EYES: denies vision changes, using glasses ENMT: denies dysphagia Cardiovascular: denies chest pain, denies DOE Pulmonary: denies cough, denies increased SOB Abdomen: endorses good appetite, denies constipation, endorses continence of bowel GU: denies dysuria, endorses continence of urine MSK:  endorses  increased weakness,  no falls reported Skin: lap sites  glued, no infection. Neurological: endorses  abdomen post op  pain, denies insomnia Psych: Endorses positive mood Heme/lymph/immuno: denies bruises, abnormal bleeding  Physical Exam: Current and past weights: 170 reported Constitutional: NAD General: frail appearing EYES: anicteric sclera, lids intact,  no discharge  ENMT: intact hearing, oral mucous membranes moist, dentition intact CV: S1S2, RRR, no LE edema Pulmonary: LCTA, no increased work of breathing, no cough, room air Abdomen: intake 50%, normo-active BS + 4 quadrants, soft and non tender, no ascites,  6 glued lap sites, chole tube out, intact op site. GU: deferred MSK:  mild  sarcopenia, moves all extremities, ambulatory Skin: warm and dry, no rashes or wounds on visible skin Neuro:  + generalized weakness,  no cognitive impairment Psych: non-anxious affect, A and O x 3 Hem/lymph/immuno: no widespread bruising   Thank you for the opportunity to participate in the care of [name].  The palliative care team will continue to follow. Please call our office at [phone] if we can be of additional assistance.   Jason Coop, NP DNP, AGPCNP-BC  COVID-19 PATIENT SCREENING TOOL Asked and negative response unless otherwise noted:   Have you had symptoms of covid, tested positive or been in contact with someone with symptoms/positive test in the past 5-10 days?

## 2021-08-06 IMAGING — CT CT ABD-PELV W/O CM
2 of 4 series · 17 of 46 positions shown, 19 images · non-contrast
Comparison: None.

CLINICAL DATA: Right-sided abdominal pain and diarrhea.

EXAM:
CT ABDOMEN AND PELVIS WITHOUT CONTRAST
TECHNIQUE: Multidetector CT imaging of the abdomen and pelvis was performed
following the standard protocol without IV contrast.

[Series 2: routine abd/pel wo · axial · 0.91mm/px · z∈[-566,-140]mm · 14 of 93 slices shown, 16 images]
[im 4/93  soft-tissue]
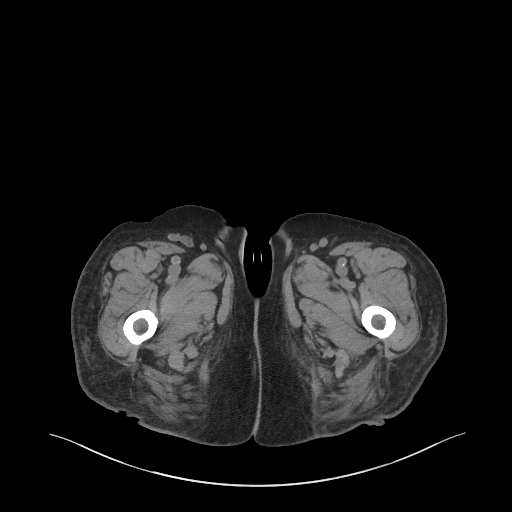
[im 4/93  bone]
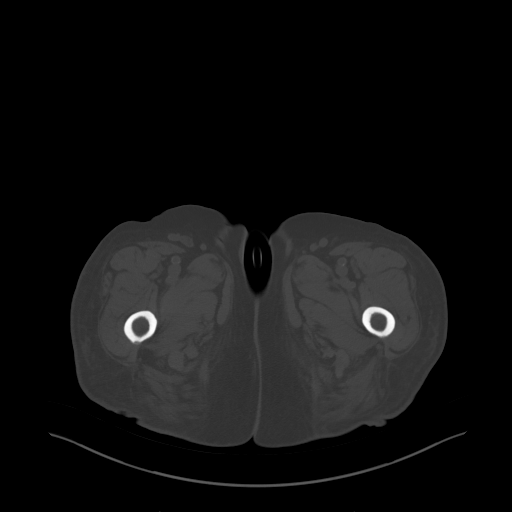
[im 11/93  soft-tissue]
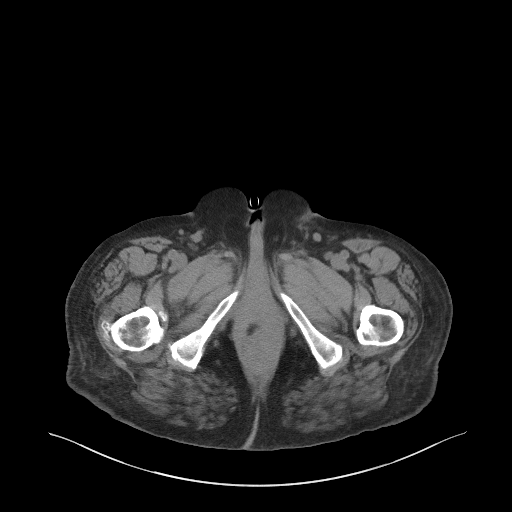
[im 18/93  soft-tissue]
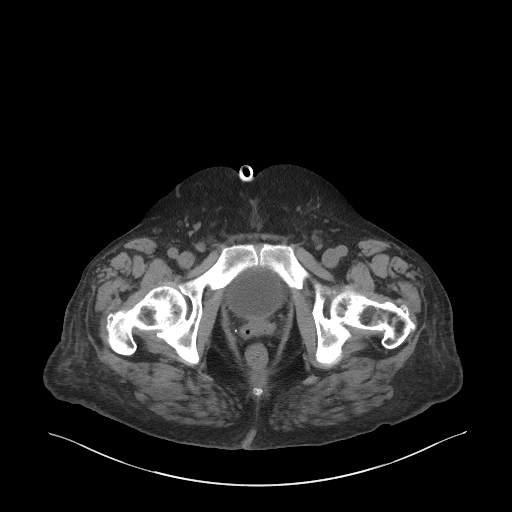
[im 25/93  soft-tissue]
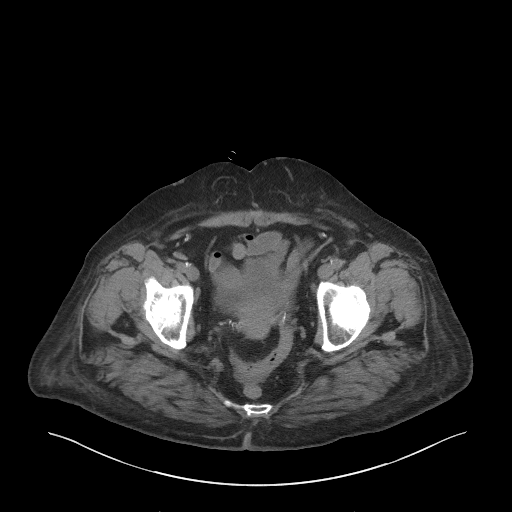
[im 32/93  soft-tissue]
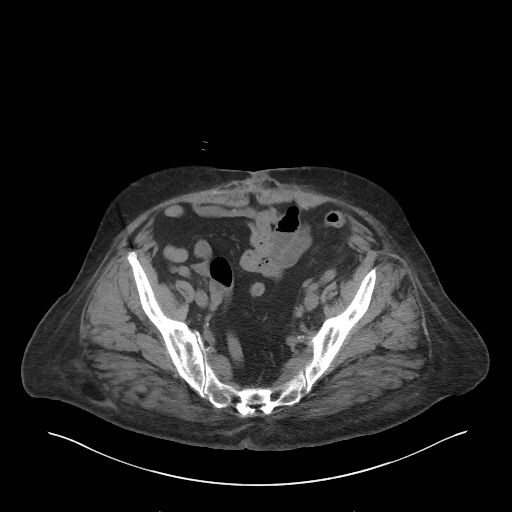
[im 36/93  soft-tissue]
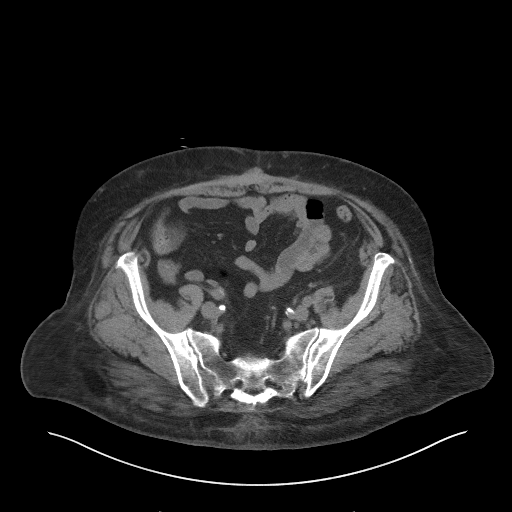
[im 43/93  soft-tissue]
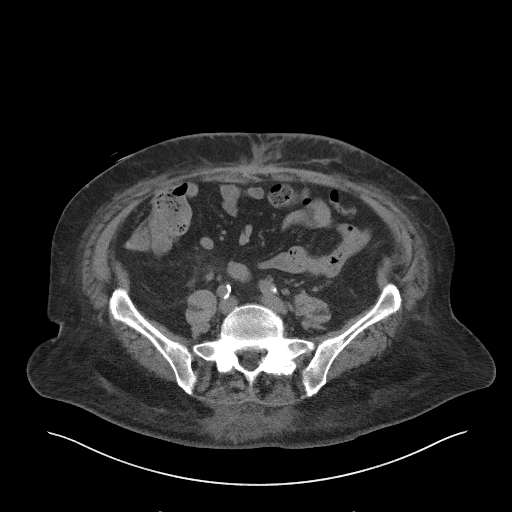
[im 50/93  soft-tissue]
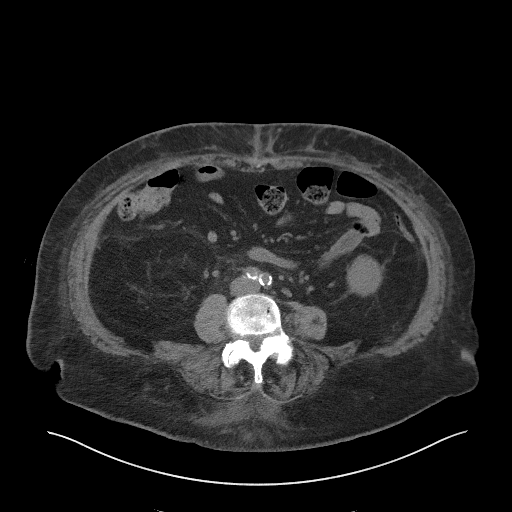
[im 57/93  soft-tissue]
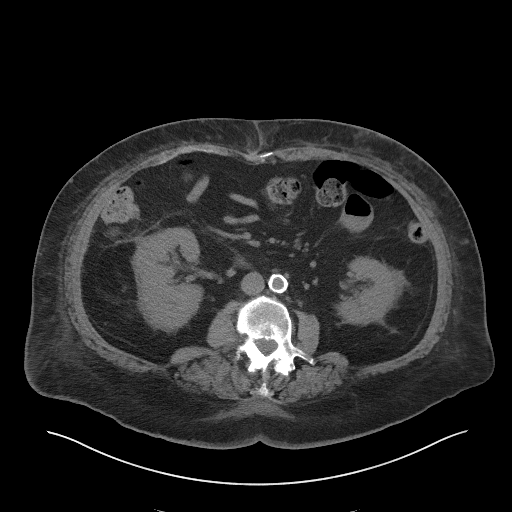
[im 57/93  bone]
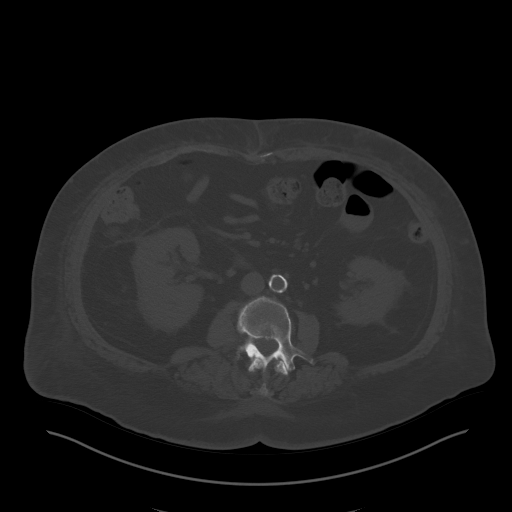
[im 61/93  soft-tissue]
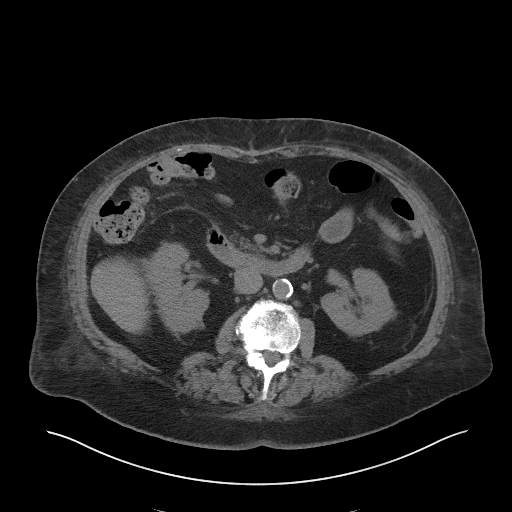
[im 68/93  soft-tissue]
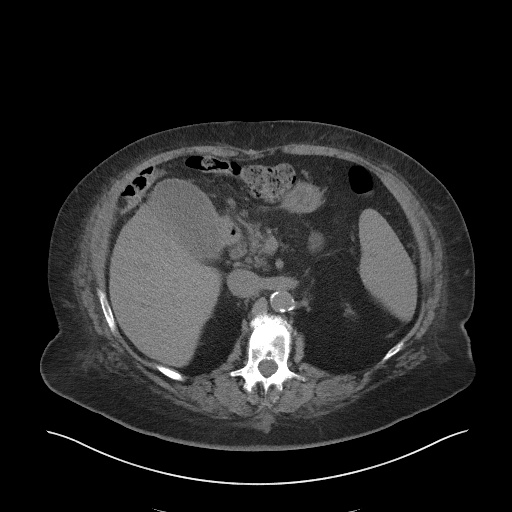
[im 75/93  soft-tissue]
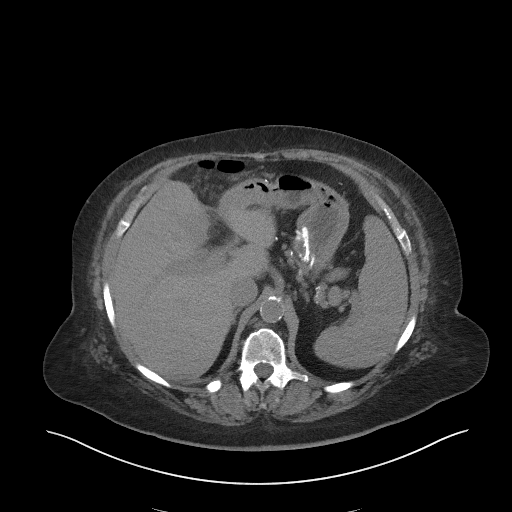
[im 82/93  soft-tissue]
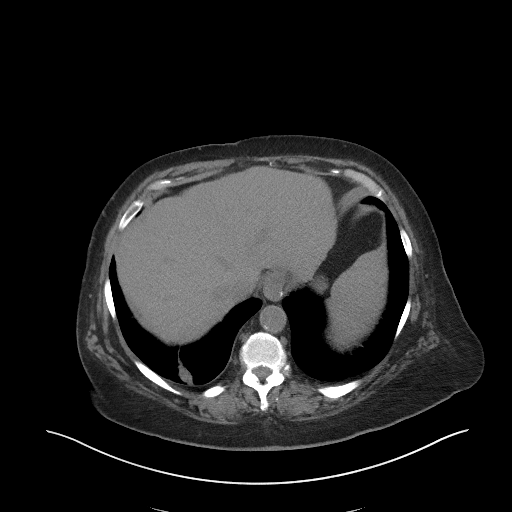
[im 89/93  soft-tissue]
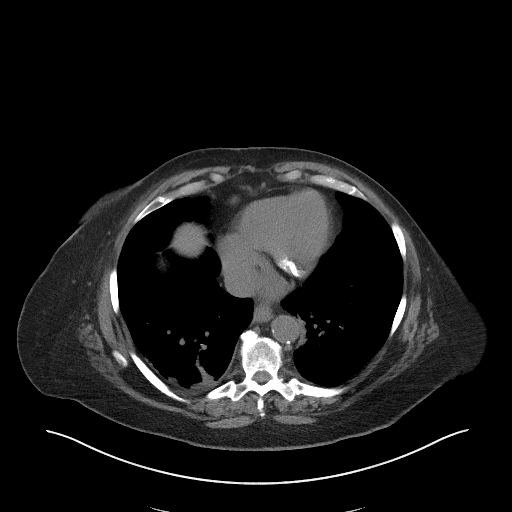

[Series 5: coronal st · coronal · 0.82mm/px · 3 of 103 slices shown]
[im 35/103  soft-tissue]
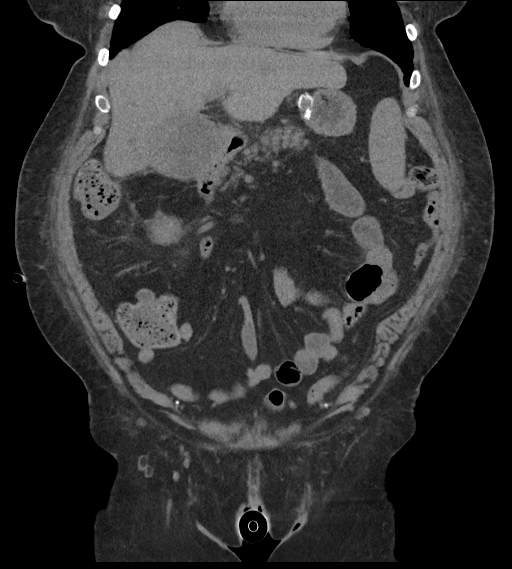
[im 46/103  soft-tissue]
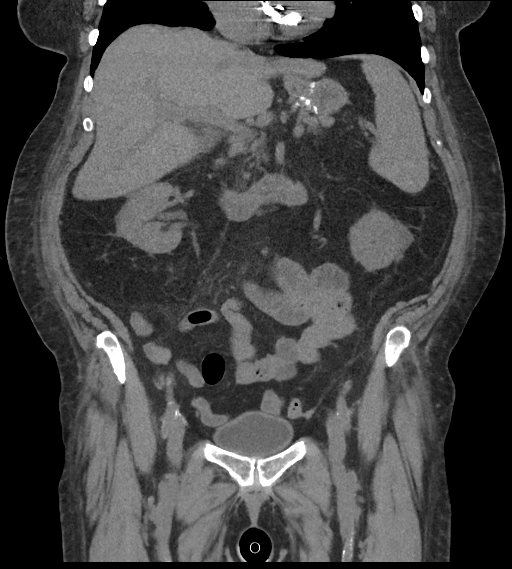
[im 57/103  soft-tissue]
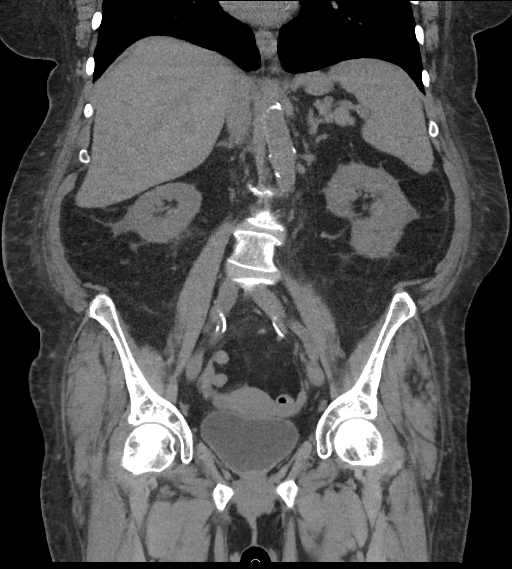

[17 of 46 positions shown; findings below may reference images not displayed]

FINDINGS: Lower chest: Right lower lobe atelectasis or scarring noted.

Hepatobiliary: No mass visualized on this unenhanced exam. Several
small calcified gallstones are seen. The gallbladder is distended
and shows diffuse wall thickening and pericholecystic inflammatory
changes, consistent with acute cholecystitis. No evidence of biliary
ductal dilatation.

Pancreas: No mass or inflammatory process visualized on this
unenhanced exam.

Spleen:  Within normal limits in size.

Adrenals/Urinary tract: No evidence of urolithiasis or
hydronephrosis. Unremarkable unopacified urinary bladder.

Stomach/Bowel: Surgical staples again seen in the proximal stomach.
No evidence of obstruction, inflammatory process, or abnormal fluid
collections.

Vascular/Lymphatic: No pathologically enlarged lymph nodes
identified. No evidence of abdominal aortic aneurysm. Aortic
atherosclerotic calcification noted.

Reproductive:  No mass or other significant abnormality.

Other:  None.

Musculoskeletal:  No suspicious bone lesions identified.
IMPRESSION: Findings consistent with acute cholecystitis.

No evidence of biliary ductal dilatation.

Aortic Atherosclerosis ([id number]).

## 2021-08-09 ENCOUNTER — Telehealth: Payer: Self-pay

## 2021-08-09 NOTE — Telephone Encounter (Signed)
959 am. Phone call made to patient to schedule a follow up home visit.  No answer.  Message left requesting a call back.  ?

## 2021-08-09 NOTE — Telephone Encounter (Signed)
125 pm.  Incoming call from patient.  She is agreeable for visit on 5/25 @ 1 pm with Ralene Bathe, NP.  ?

## 2021-08-25 ENCOUNTER — Other Ambulatory Visit: Payer: Self-pay | Admitting: Physical Medicine & Rehabilitation

## 2021-08-25 ENCOUNTER — Other Ambulatory Visit (HOSPITAL_COMMUNITY): Payer: Self-pay | Admitting: Physical Medicine & Rehabilitation

## 2021-08-25 DIAGNOSIS — M5441 Lumbago with sciatica, right side: Secondary | ICD-10-CM

## 2021-08-29 ENCOUNTER — Ambulatory Visit
Admission: RE | Admit: 2021-08-29 | Discharge: 2021-08-29 | Disposition: A | Discharge status: Home / Self Care | Payer: Medicare Other | Source: Ambulatory Visit | Attending: Physical Medicine & Rehabilitation | Admitting: Physical Medicine & Rehabilitation

## 2021-08-29 ENCOUNTER — Ambulatory Visit (HOSPITAL_COMMUNITY): Payer: Medicare Other

## 2021-08-29 DIAGNOSIS — M5442 Lumbago with sciatica, left side: Secondary | ICD-10-CM | POA: Insufficient documentation

## 2021-08-29 DIAGNOSIS — M5441 Lumbago with sciatica, right side: Secondary | ICD-10-CM | POA: Insufficient documentation

## 2021-09-14 ENCOUNTER — Other Ambulatory Visit: Payer: Medicare Other | Admitting: Primary Care

## 2021-09-14 VITALS — BP 142/70 | HR 80 | Temp 97.2°F

## 2021-09-14 DIAGNOSIS — R531 Weakness: Secondary | ICD-10-CM

## 2021-09-14 DIAGNOSIS — Z515 Encounter for palliative care: Secondary | ICD-10-CM

## 2021-09-14 DIAGNOSIS — J449 Chronic obstructive pulmonary disease, unspecified: Secondary | ICD-10-CM

## 2021-09-14 DIAGNOSIS — I5032 Chronic diastolic (congestive) heart failure: Secondary | ICD-10-CM

## 2021-09-14 NOTE — Progress Notes (Signed)
[facility] Consult Note Telephone: [phone]  Fax: [phone]    Date of encounter: 09/14/21 1:06 PM PATIENT NAME: Gina Chambers [address] [place] [address]   [phone] (home)  DOB: 20-Mar-1941 MRN: [id number] PRIMARY CARE PROVIDER:    Kirk Ruths, MD,  [address] [place] [address] [phone]  REFERRING PROVIDER:   Kirk Ruths, MD [facility] [place],  [place] [address] [phone]  RESPONSIBLE PARTY:    Contact Information     Name Relation Home Work Mobile   Gina Chambers Daughter [phone]     Gina Chambers   [phone]       I connected with  Gina Chambers on 09/14/21 by a video enabled telemedicine application and verified that I am speaking with the correct person using two identifiers.   I discussed the limitations of evaluation and management by telemedicine. The patient expressed understanding and agreed to proceed.   I met face to face with patient and family in the home connecting virtually with Gina Gather, RN. Palliative Care was asked to follow this patient by consultation request of  Kirk Ruths, MD to address advance care planning and complex medical decision making. This is a follow up visit.                                   ASSESSMENT AND PLAN / RECOMMENDATIONS:   Advance Care Planning/Goals of Care: Goals include to maximize quality of life and symptom management. Patient/health care surrogate gave his/her permission to discuss. Our advance care planning conversation included a discussion about:     Exploration of personal, cultural or spiritual beliefs that might influence medical decisions  Exploration of goals of care in the event of a sudden injury or illness  Identification of a healthcare agent - daughter  Review of an  advance directive document . CODE STATUS:  DNR  Symptom Management/Plan:  COVID-19 exposure:  Patient advised her daughter tested + for Covid last week.  Patient has not tested but felt she likely had it as she lives with her daughter.  Patient reports coughing that started last week with green sputum production.  This is now a light yellow.  No fever reported.  No worsening shortness of breath reported by patient.   Nutrition: At baseline per patient. Tolerates regular diet. Has had no more gi problems since her surgeries in the winter.   Abdominal swelling: Area in L LQ with cyst like appearance. Has had a procedure and this was result. It gets in her way but not painful. Spoke of surgical consult but she wants to live with it for a while.   Pain: Continues with pain to her lower back and legs.  Scheduled for injections in her back on June 1.  Patient states she is constantly moving and doing walking exercises as this helps her with the pain.   Follow up Palliative Care Visit: Palliative care will continue to follow for complex medical decision making, advance care planning, and clarification of goals. Return 4 months or prn.  This visit was coded based on medical decision making (MDM).  PPS: 50%  HOSPICE ELIGIBILITY/DIAGNOSIS: TBD  Chief Complaint: chronic pain, debility  HISTORY OF PRESENT ILLNESS:  Gina Chambers is a 81 y.o. year old female  with  longstanding chole  drain and was waiting for removal. Surgery was moved form earlier in month to  05/15/21 She has done well post op. Has expected post op pain,some nausea.  History obtained from review of EMR, discussion with primary team, and interview with family, facility staff/caregiver and/or Gina Chambers.  I reviewed available labs, medications, imaging, studies and related documents from the EMR.  Records reviewed and summarized above.   ROS  General: NAD EYES: denies vision changes ENMT: denies dysphagia Cardiovascular: denies chest pain, denies DOE Pulmonary: + cough  (green to orange now light yellow), denies increased SOB Abdomen: endorses good appetite, denies constipation, endorses continence of bowel GU: denies dysuria, endorses some urinary  incontinence of urine MSK:  denies increased weakness,  no falls reported Skin: denies rashes or wounds Neurological: + pain to back and hips, denies insomnia Psych: Endorses positive mood Heme/lymph/immuno: denies bruises, abnormal bleeding  Physical Exam: Current: 164 lbs per last provider note. Constitutional: NAD General: WNWD EYES: anicteric sclera, lids intact, no discharge  ENMT: intact hearing, oral mucous membranes moist, dentition intact CV:  1+ bilateral ankle pitting edema Pulmonary: no increased work of breathing, no cough, room air Abdomen:  soft and non -tender, no ascites, area of swelling on L abdomen.  GU: deferred MSK: no sarcopenia, moves all extremities, ambulatory Skin: warm and dry, no rashes or wounds on visible skin Neuro:  no generalized weakness,  no cognitive impairment Psych: non-anxious affect, A and O x 3 Hem/lymph/immuno: no widespread bruising   Thank you for the opportunity to participate in the care of Gina Chambers.  The palliative care team will continue to follow. Please call our office at [phone] if we can be of additional assistance.   Gina Burton, RN   COVID-19 PATIENT SCREENING TOOL Asked and negative response unless otherwise noted:   Have you had symptoms of covid, tested positive or been in contact with someone with symptoms/positive test in the past 5-10 days? Yes

## 2021-09-16 IMAGING — RF DG CHOLANGIOGRAM VIA EXIST CATHETER
2 series · 5 of 5 positions shown · non-contrast
Comparison: none

CLINICAL DATA: gallbladder dysfunction

EXAM:
CHOLECYSTOSTOMY CATHETER INJECTION UNDER FLUOROSCOPY
TECHNIQUE: The procedure, risks (including but not limited to bleeding,
infection, organ damage), benefits, and alternatives were explained
to the patient. Questions regarding the procedure were encouraged
and answered. The patient understands and consents to the procedure.
Survey fluoroscopic inspection reveals stable position of the
pigtail cholecystostomy catheter.
Injection demonstrates multiple small filling defects in the lumen
of the gallbladder. Cystic duct is patent. Contrast flows into the
decompressed duodenum. There is limited opacification of the CBD.

[Series 1: cp_standard · 0.17mm/px · 1 of 1 slices shown (1 of 2)]
[im 1/1]
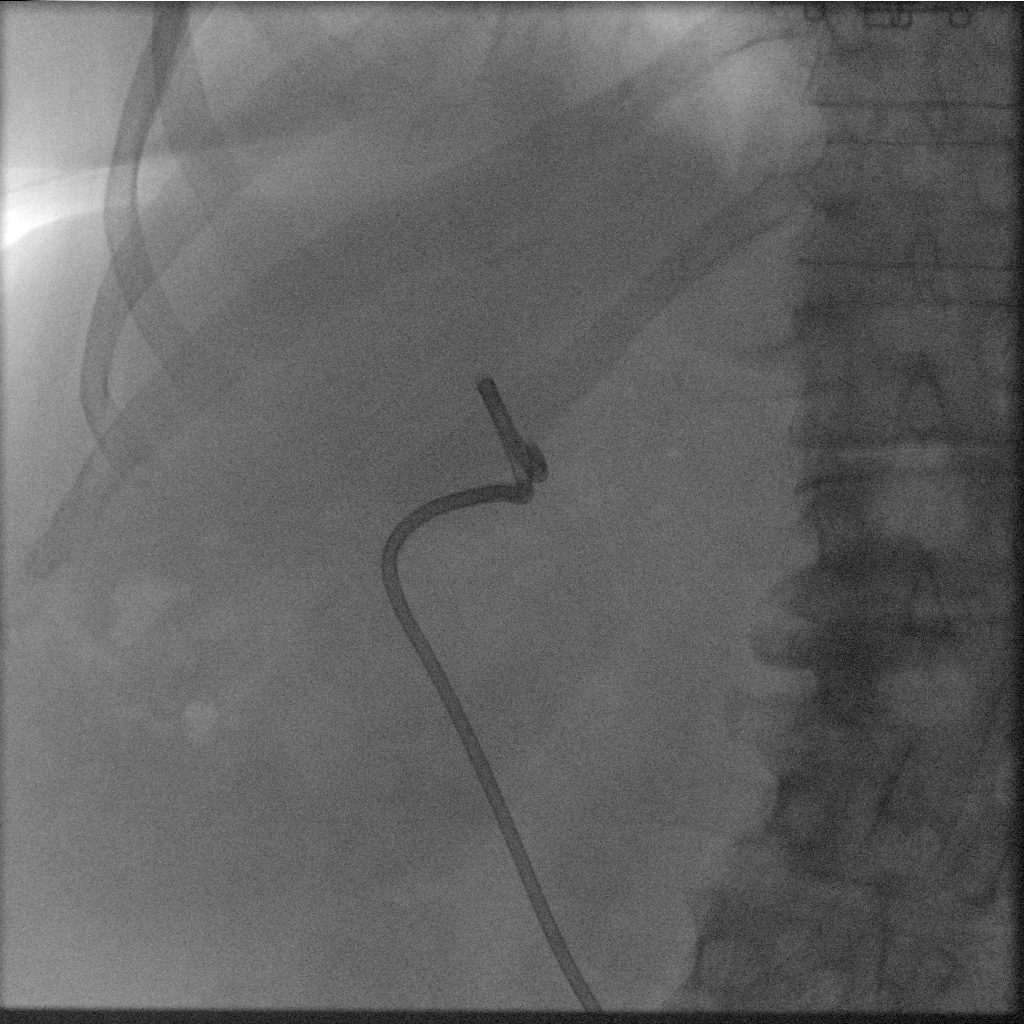

[Series 2: cp_standard · 0.17mm/px · 4 of 40 frames shown (2 of 2)]
[frame 7/40]
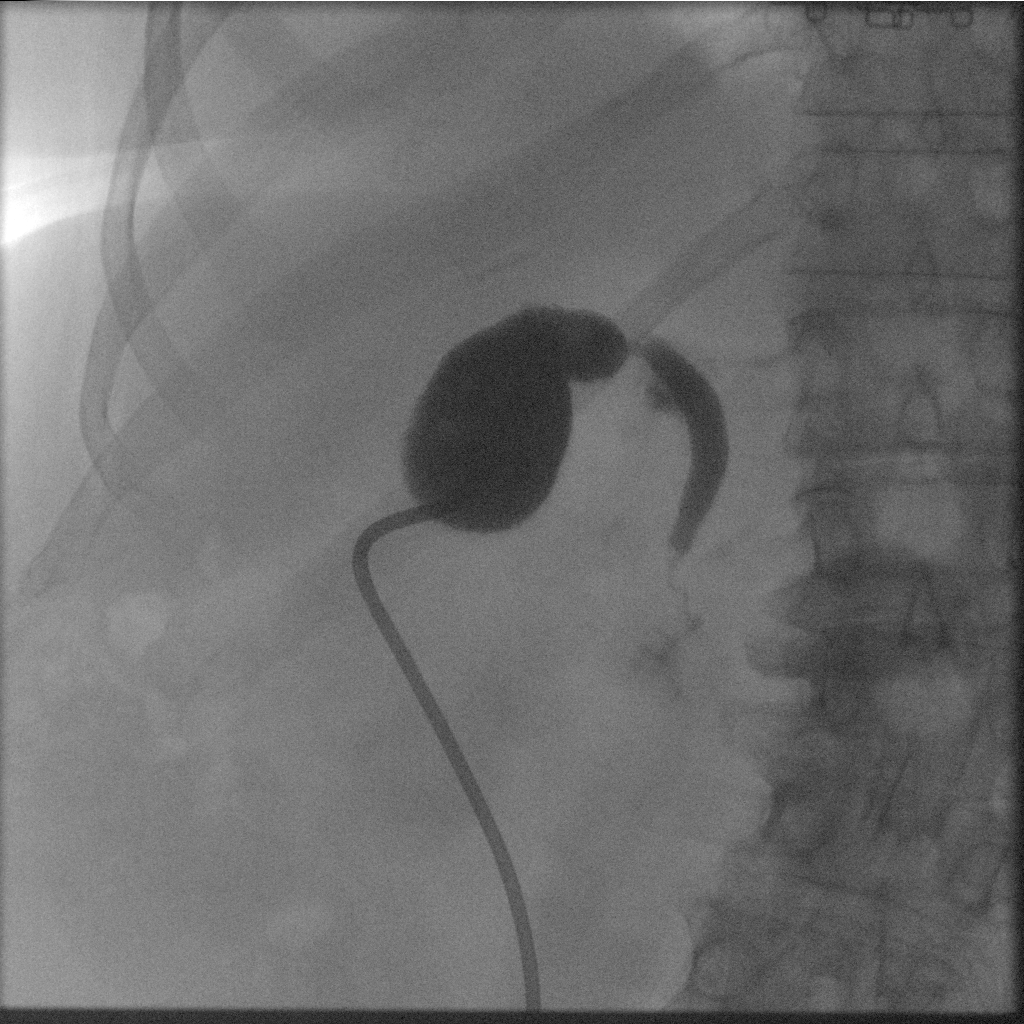
[frame 8/40]
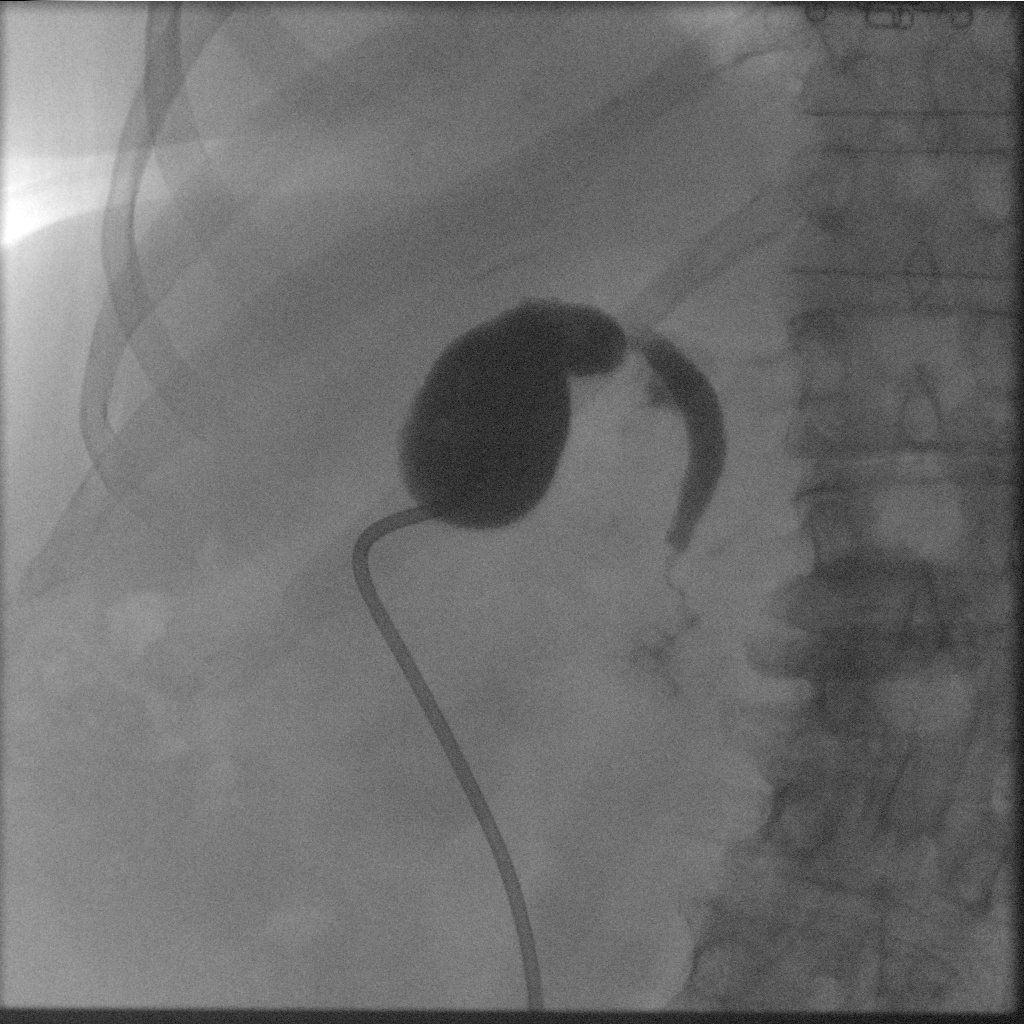
[frame 21/40]
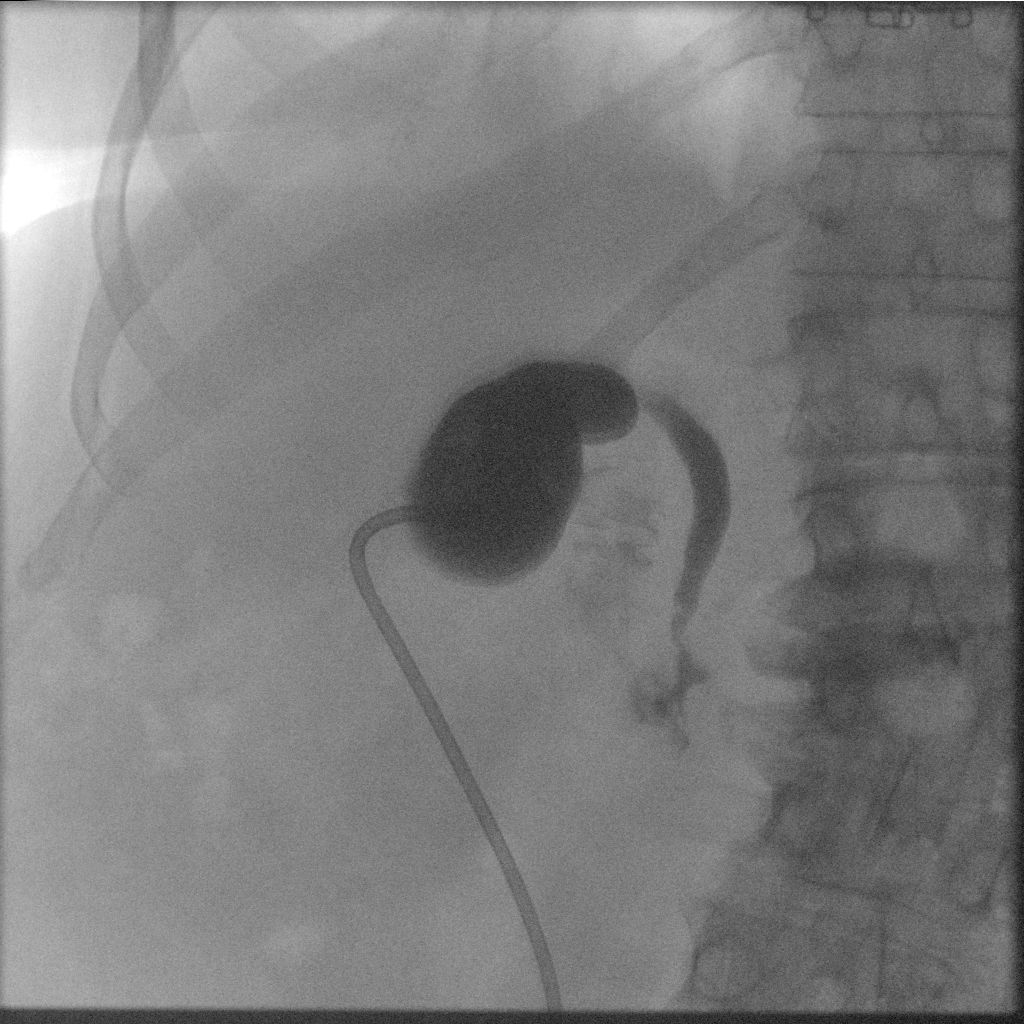
[frame 35/40]
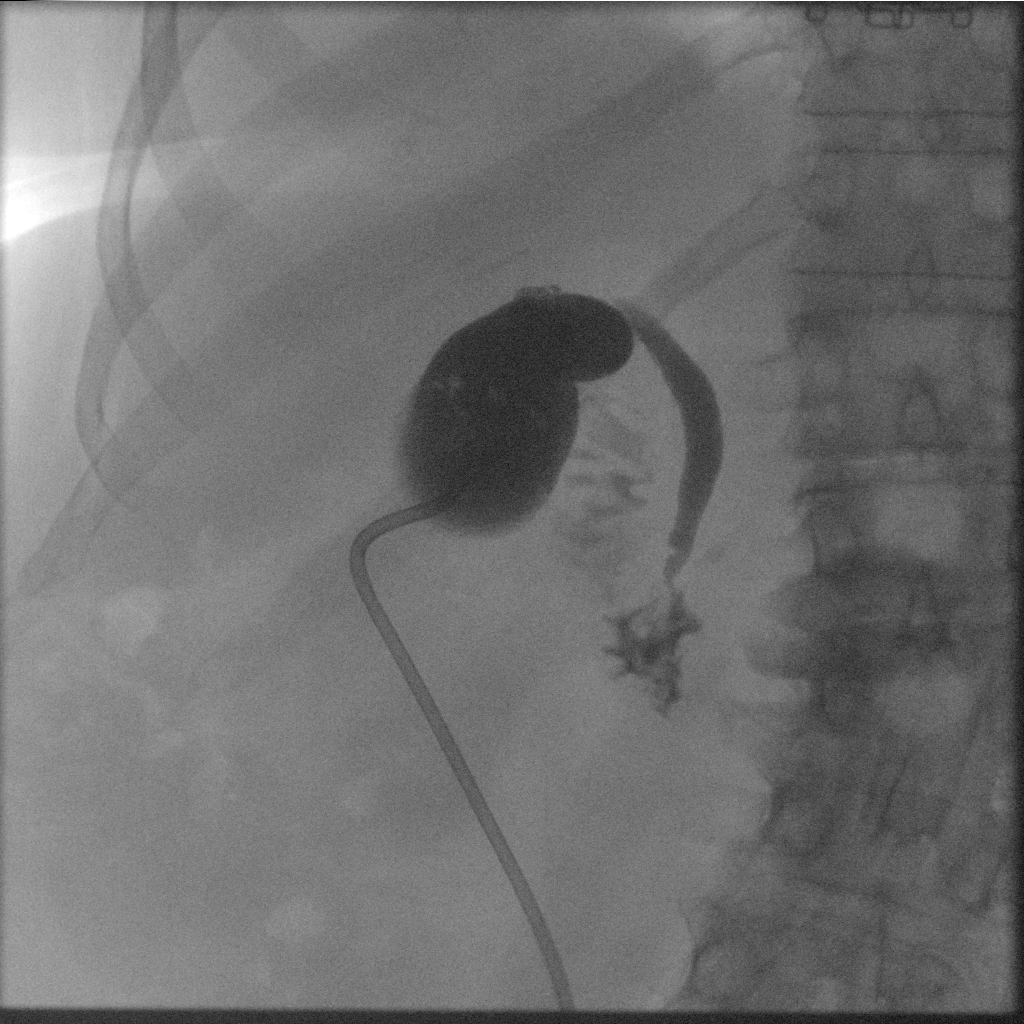

[5 of 5 positions shown; findings below may reference images not displayed]

IMPRESSION: 1. Patency of cystic duct with no evidence of obstructing
choledocholithiasis.

As per the original plan, the cholecystostomy tube was capped and
the patient was given a new bag with instructions to return to
external drainage in the setting of recurrent gallbladder symptoms,
right upper quadrant pain, fever, or drainage around the
cholecystostomy catheter.
We can see her back in 2-3 weeks.

## 2021-10-05 ENCOUNTER — Other Ambulatory Visit: Payer: Self-pay | Admitting: Physical Medicine & Rehabilitation

## 2021-10-05 DIAGNOSIS — M542 Cervicalgia: Secondary | ICD-10-CM

## 2021-10-14 ENCOUNTER — Ambulatory Visit: Payer: Medicare Other

## 2021-10-19 IMAGING — XA IR CHOLANGIOGRAM VIA EXIST CATHETER
1 series · 13 of 13 positions shown · non-contrast
Comparison: none

INDICATION: History of acute calculus cholecystitis, presents for catheter care
after capping trial

[Series 1: vasc extremity · 7 acquisitions, 13 frames shown]
[im 1/7]
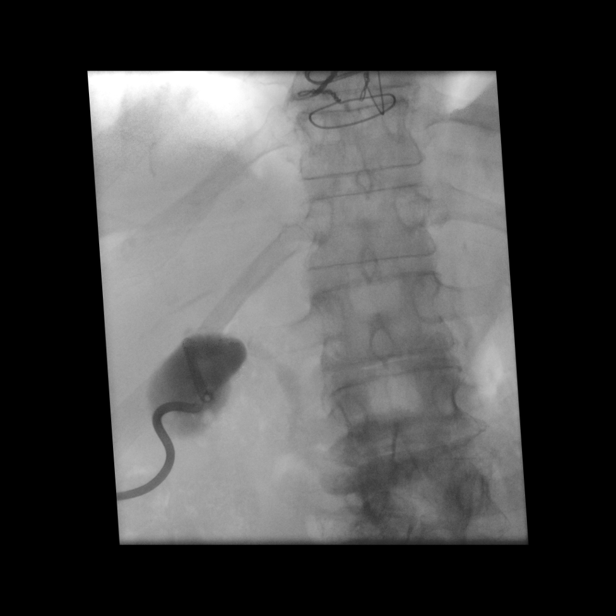
[im 2/7]
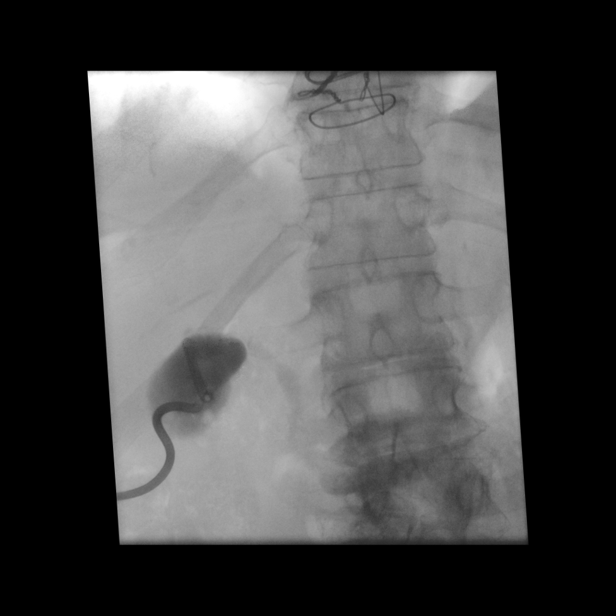
[im 3/7]
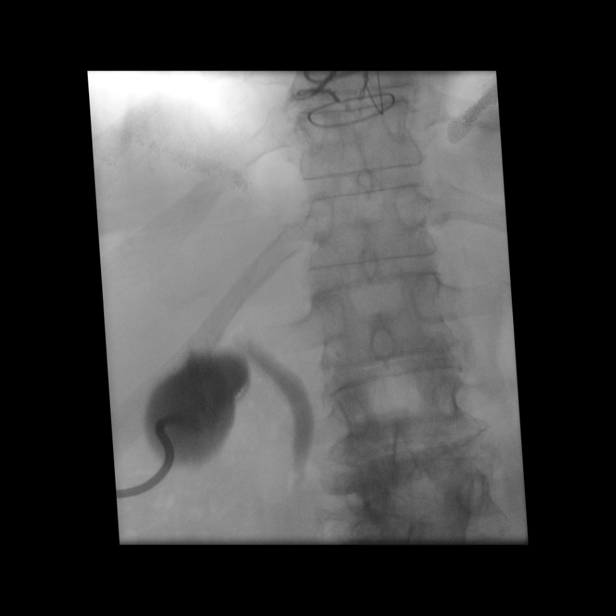
[im 3/7]
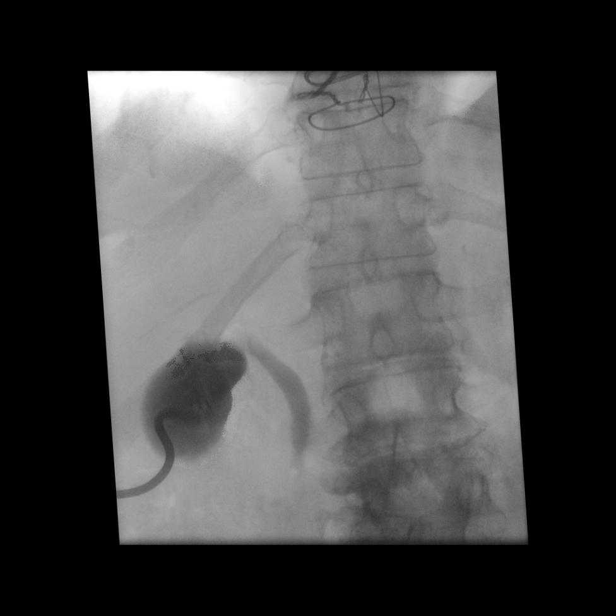
[im 3/7]
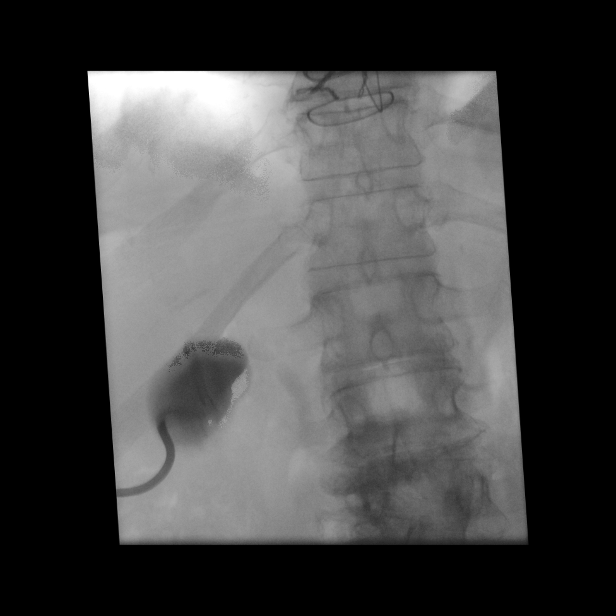
[im 3/7]
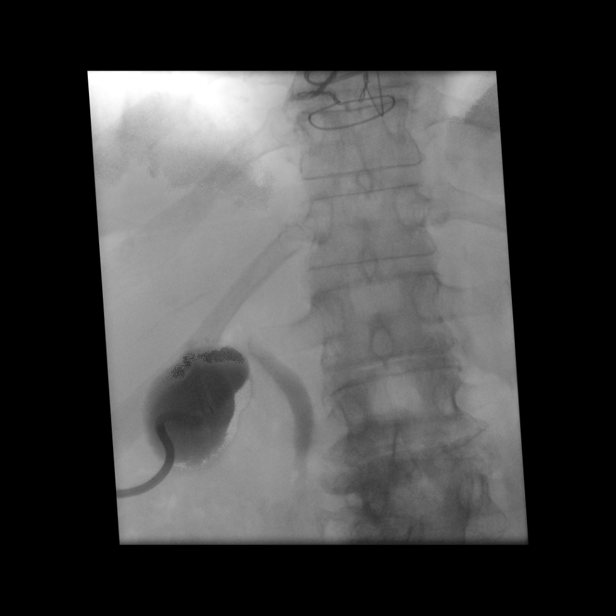
[im 4/7]
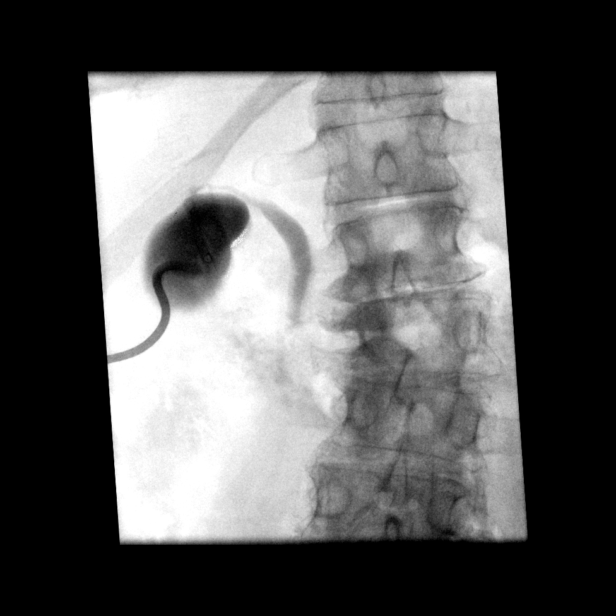
[im 5/7]
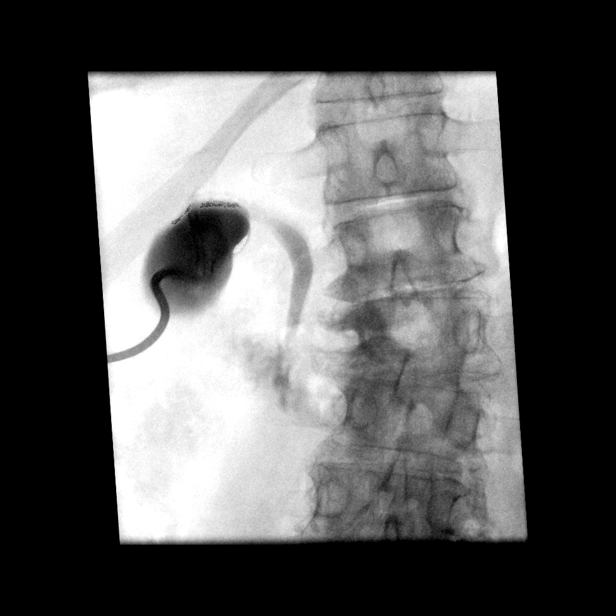
[im 6/7]
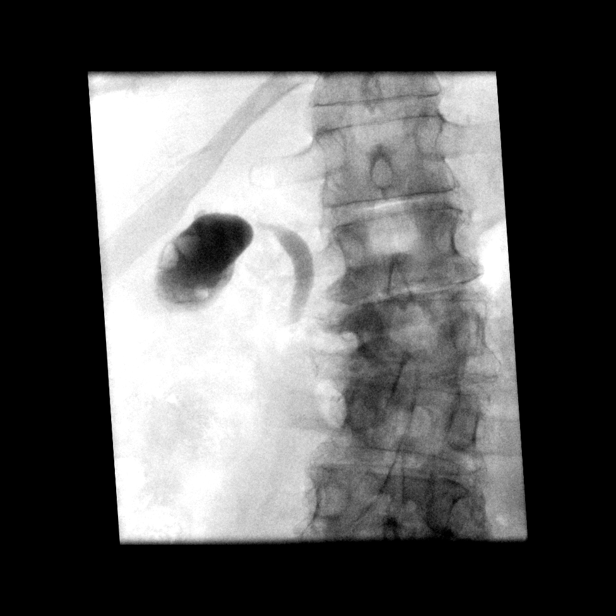
[im 7/7]
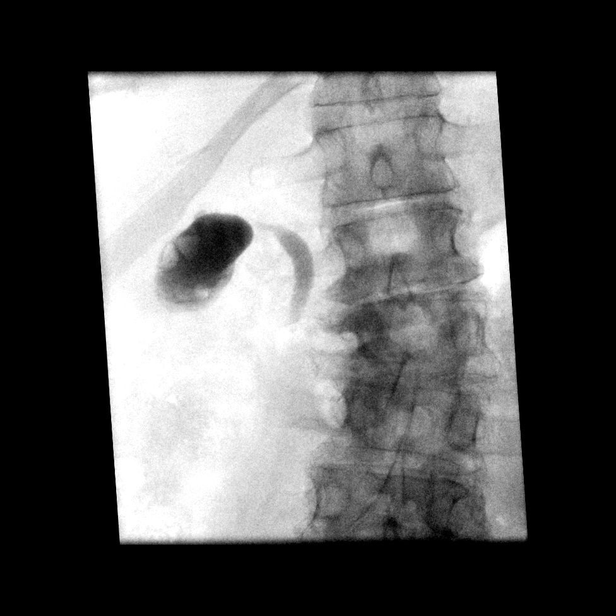
[im 7/7]
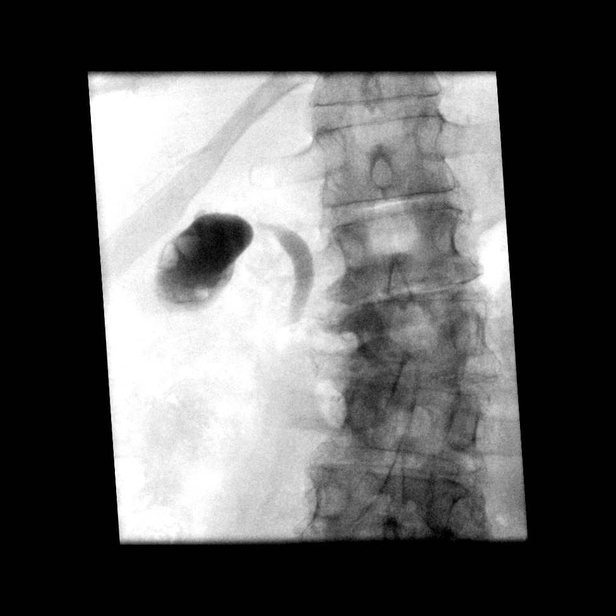
[im 7/7]
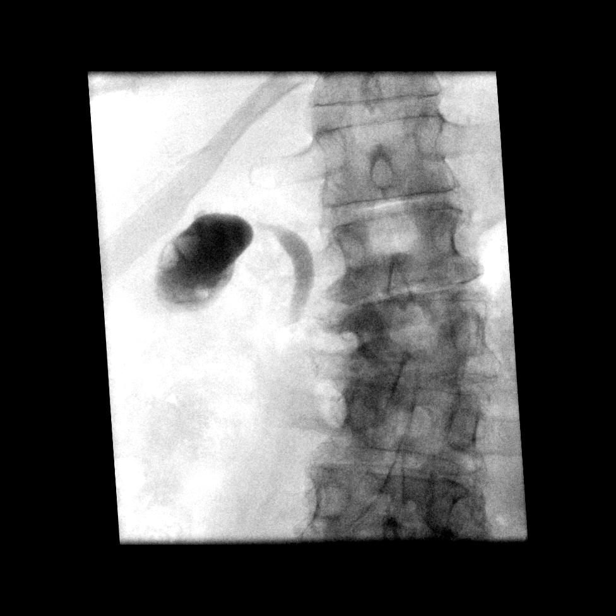
[im 7/7]
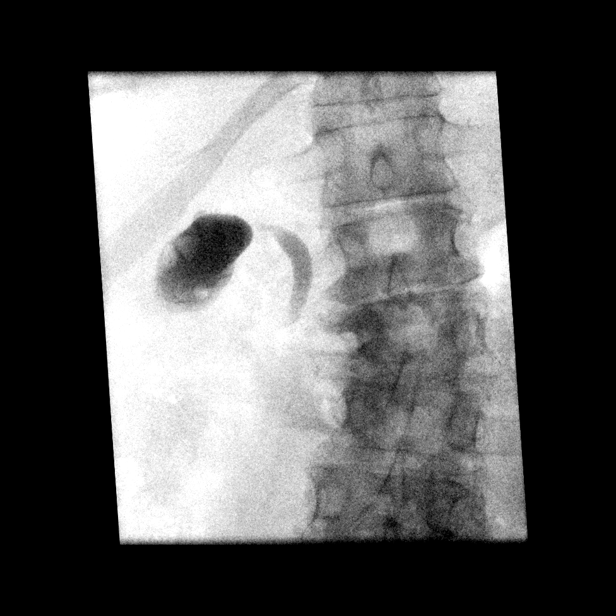

[13 of 13 positions shown; findings below may reference images not displayed]

EXAM:
1. Cholecystogram the existing percutaneous cholecystostomy tube
2. Removal of cholecystostomy tube using fluoroscopy

MEDICATIONS:
None

ANESTHESIA/SEDATION:
Local analgesia

FLUOROSCOPY TIME:  Fluoroscopy Time: 0 minutes 54 seconds (19 mGy).

COMPLICATIONS:
None immediate.

PROCEDURE:
Informed written consent was obtained from the patient after a
thorough discussion of the procedural risks, benefits and
alternatives. All questions were addressed. Maximal Sterile Barrier
Technique was utilized including caps, mask, sterile gowns, sterile
gloves, sterile drape, hand hygiene and skin antiseptic. A timeout
was performed prior to the initiation of the procedure.

A complete and thorough discussion was had with the patient prior to
the procedure. This included the risks and benefits of keeping the
cholecystostomy tube in place, versus removing it at this time. Per
previous discussions, the patient is not a surgical candidate and
did not desire to have percutaneous stone removal. The patient
strongly desires to no longer have the cholecystostomy tube in
place, despite a complete and thorough discussion of the risks
involved with removing the cholecystostomy tube with known gallstone
disease and a history of at least 1 episode of acute cholecystitis.
Per previous exams, the cystic duct was patent. The patient has had
the cholecystostomy tube capped since the previous exam, without any
symptoms concerning for recurrence acute cholecystitis, including
right upper quadrant pain, nausea vomiting, and fever. After
discussing the risks of removing the cholecystostomy tube, including
the possibility of a recurrent episode of acute cholecystitis
requiring admission to the hospital and possible replacement of the
cholecystostomy tube, the patient still desired cholecystostomy tube
to be removed at this time.

The patient was positioned supine on the exam table. The right upper
quadrant was prepped and draped in a standard sterile fashion with
inclusion of the existing cholecystostomy tube within the sterile
field. A cholecystogram was performed via the existing access. This
demonstrated appropriate positioning of the tube within the
gallbladder. There is prompt passage of contrast material via the
patent cystic duct into the CBD, with passage of contrast material
into the bowel. Small filling defects within the gallbladder
consistent with known gallstone disease. Based on patency of the
cystic duct and preprocedure discussion with the patient as
described above, the decision was then made to proceed with removal
of the cholecystostomy tube. The locking loop was released, and/or
an Amplatz wire, the cholecystostomy tube was removed without issue.
A clean dressing was placed. The patient tolerated the procedure
well without immediate complication.
IMPRESSION: Cholecystogram demonstrates a patent cystic duct with prompt passage
of contrast material into the small bowel. After a complete and
thorough preprocedure discussion with the patient about the risks of
removal of the cholecystostomy tube at this time despite known
gallstone disease and a history of a recent episode of acute
cholecystitis, the patient desired the tube be removed. She voiced
understanding of the risks, and given that the cystic duct was
patent on this exam, the tube was removed without issue.

## 2021-10-21 ENCOUNTER — Ambulatory Visit
Admission: RE | Admit: 2021-10-21 | Discharge: 2021-10-21 | Disposition: A | Discharge status: Home / Self Care | Payer: Medicare Other | Source: Ambulatory Visit | Attending: Physical Medicine & Rehabilitation | Admitting: Physical Medicine & Rehabilitation

## 2021-10-21 DIAGNOSIS — M542 Cervicalgia: Secondary | ICD-10-CM

## 2021-10-27 ENCOUNTER — Encounter: Payer: Self-pay | Admitting: Emergency Medicine

## 2021-10-27 ENCOUNTER — Ambulatory Visit
Admission: EM | Admit: 2021-10-27 | Discharge: 2021-10-27 | Disposition: A | Discharge status: Home / Self Care | Payer: Medicare Other | Attending: Physician Assistant | Admitting: Physician Assistant

## 2021-10-27 DIAGNOSIS — R233 Spontaneous ecchymoses: Secondary | ICD-10-CM | POA: Diagnosis not present

## 2021-10-27 DIAGNOSIS — L299 Pruritus, unspecified: Secondary | ICD-10-CM

## 2021-10-27 MED ORDER — HYDROXYZINE HCL 25 MG PO TABS: 25.0000 mg | ORAL_TABLET | Freq: Four times a day (QID) | ORAL | 0 refills | Status: AC | PRN

## 2021-10-27 MED ORDER — HYDROXYZINE HCL 25 MG PO TABS: 25.0000 mg | ORAL_TABLET | Freq: Four times a day (QID) | ORAL | 0 refills | Status: DC | PRN

## 2021-10-27 NOTE — Discharge Instructions (Addendum)
-  Your rash is a petechial rash.  It is likely secondary to all the scratching you are doing.  I have sent a different medication to help with your itching.  Try not to scratch. - Elevate your extremities to help with the swelling. - Continue the Augmentin for your UTI.  This medication will help treat if there is any cellulitis or skin infection although it looks to be most consistent with tiny broken blood vessels.  The other concern would be a DVT but as discussed, you have about a 2% risk of having a DVT while being on the Xarelto.  If the swelling worsens or you have increased redness or worsening rash, you should follow-up with your primary care provider or go to the ED for ultrasound.

## 2021-10-27 NOTE — ED Triage Notes (Signed)
Patient reports left lower leg redness and discomfort that started over 2 weeks ago.  Patient states that it first started out itching.  Patient states that she is on an antibiotic for UTI.

## 2021-10-27 NOTE — ED Provider Notes (Signed)
[facility]    CSN: [id number] Arrival date & time: 10/27/21  1350      History   Chief Complaint Chief Complaint  Patient presents with   Leg Pain    Left lower    HPI Gina Chambers is a 81 y.o. female presenting for rash of the left medial lower extremity for the past couple days.  Patient reports severe "diabetic itch."  Reports that she takes Benadryl and it does not help.  She says she is not scratching the leg a lot recently and thinks she was scratching before the rash appeared.  She reports that she went to physical medicine today where she goes for chronic back pain management and the provider thought she should be checked out for the rash. Patient has history of chronic back pain and lumbar radiculopathy.  Patient sees physical medicine and has had epidural steroid injections with the last one being last month.  Had MRI performed in May of this year.  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  Denies missing any doses.  Patient reports she tried to reach out to her doctor regarding her rash but has not heard back.  Denies rashes elsewhere.  No difficulty breathing.  No other complaints.  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 last one approx 2013   Family history of adverse reaction to anesthesia    Mother - temporary paralysis of 1 side   GERD (gastroesophageal reflux disease)    Hypertension    Neuropathy    feet   PONV (postoperative nausea and vomiting)    Pt reports violent vomiting with ANY pain meds given with anesthesia.   Sleep apnea    CPAP   Thyroid disease    Vertigo    daily    Patient Active Problem List   Diagnosis Date Noted   Paroxysmal atrial flutter ([place]) 01/20/2021   Acute cholecystitis 01/15/2021   Right  shoulder pain 01/15/2021   COPD (chronic obstructive pulmonary disease) (HCC)    DVT (deep venous thrombosis) (HCC)    OSA on CPAP    Chronic diastolic CHF (congestive heart failure) ([place])    Sepsis ([place])    Inguinal abscess    Anticoagulated    COVID-19 virus infection 09/24/2020   Type 2 diabetes mellitus without complication ([place]) 40/81/4481   Hyponatremia 09/24/2020   Cholecystitis 09/23/2020   Chest pain 12/24/2018   Hypoglycemia 11/07/2017   GIB (gastrointestinal bleeding) 11/07/2016   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) ([place]) 06/19/2016   COPD exacerbation ([place]) 05/12/2016   Community acquired pneumonia 05/12/2016   Leukocytosis 05/12/2016   Generalized weakness 05/12/2016   Acquired hypothyroidism 12/15/2015   Essential hypertension 12/15/2015    Past Surgical History:  Procedure Laterality Date   CARDIAC SURGERY     CATARACT EXTRACTION W/PHACO Right 03/11/2017   Procedure: CATARACT EXTRACTION PHACO AND INTRAOCULAR LENS PLACEMENT ([place])  RIGHT DIABETIC;  Surgeon: Eulogio Bear, MD;  Location: [facility];  Service: Ophthalmology;  Laterality: Right;  Diabetic - insulin sleep apnea   CATARACT EXTRACTION W/PHACO Left 04/02/2017   Procedure: CATARACT EXTRACTION PHACO AND INTRAOCULAR LENS PLACEMENT ([place]) LEFT DIABETES;  Surgeon: Eulogio Bear, MD;  Location:  [facility];  Service: Ophthalmology;  Laterality: Left;  Diabetic - insulin   COLONOSCOPY WITH PROPOFOL N/A 11/09/2016   Procedure: COLONOSCOPY WITH PROPOFOL;  Surgeon: Jonathon Bellows, MD;  Location: [facility];  Service: Gastroenterology;  Laterality: N/A;   HERNIA REPAIR     IR CHOLANGIOGRAM EXISTING TUBE  12/06/2020   IR PERC CHOLECYSTOSTOMY  01/16/2021   IR RADIOLOGIST EVAL & MGMT  11/23/2020   VERTICAL BANDED GASTROPLASTY      OB History   No obstetric history on file.      Home Medications    Prior to Admission medications   Medication Sig Start Date End  Date Taking? Authorizing Provider  aspirin EC 81 MG tablet Take 81 mg by mouth at bedtime.    [provider]  bumetanide (BUMEX) 0.5 MG tablet Take 1 mg by mouth 2 (two) times daily.    [provider]  clobetasol cream (TEMOVATE) 6.14 % Apply 1 application topically 2 (two) times daily.    [provider]  diltiazem (CARDIZEM CD) 180 MG 24 hr capsule Take 1 capsule (180 mg total) by mouth 2 (two) times daily. 01/31/21   Swayze, Ava, DO  fluticasone (FLOVENT HFA) 110 MCG/ACT inhaler Inhale 2 puffs into the lungs 2 (two) times daily.    [provider]  gabapentin (NEURONTIN) 100 MG capsule Take 1 capsule (100 mg total) by mouth 2 (two) times daily. 01/31/21   Swayze, Ava, DO  gabapentin (NEURONTIN) 100 MG capsule Take 2 capsules (200 mg total) by mouth at bedtime. 01/31/21   Swayze, Ava, DO  hydrOXYzine (ATARAX) 25 MG tablet Take 1 tablet (25 mg total) by mouth every 6 (six) hours as needed for up to 10 days for itching. 10/27/21 11/06/21  Laurene Footman B, PA-C  ibuprofen (ADVIL) 600 MG tablet Take 1 tablet (600 mg total) by mouth every 6 (six) hours as needed for headache or moderate pain (Severe pain). 01/31/21   Swayze, Ava, DO  insulin glargine (LANTUS) 100 UNIT/ML injection Inject 10-20 Units into the skin See admin instructions. Inject 20u under the skin every morning and inject 10u under the skin ever night at bedtime    [provider]  insulin regular (NOVOLIN R,HUMULIN R) 250 units/2.16m (100 units/mL) injection Inject 10 Units into the skin 3 (three) times daily before meals.    [provider]  Ipratropium-Albuterol (COMBIVENT) 20-100 MCG/ACT AERS respimat Inhale 1 puff into the lungs 4 (four) times daily as needed for wheezing or shortness of breath.    [provider]  lansoprazole (PREVACID) 30 MG capsule Take 30 mg by mouth 2 (two) times daily.    [provider]  levalbuterol ([place] 0.63 MG/3ML nebulizer solution  Inhale 0.63 mg into the lungs every 6 (six) hours as needed. 04/27/19   [provider]  levothyroxine (SYNTHROID, LEVOTHROID) 125 MCG tablet Take 125 mcg by mouth daily.    [provider]  metoprolol succinate (TOPROL-XL) 50 MG 24 hr tablet Take 50 mg by mouth daily. Take with or immediately following a meal.    [provider]  mirabegron ER (MYRBETRIQ) 25 MG TB24 tablet Take 25 mg by mouth daily.    [provider]  montelukast (SINGULAIR) 10 MG tablet Take 10 mg by mouth at bedtime.    [provider]  Multiple Vitamins-Minerals (CENTRUM SILVER PO) Take 1 tablet by mouth daily.    [provider]  Multiple Vitamins-Minerals (PRESERVISION AREDS 2 PO) Take 1 tablet by  mouth 2 (two) times daily.    [provider]  rivaroxaban (XARELTO) 20 MG TABS tablet Take 20 mg by mouth daily.    [provider]  spironolactone (ALDACTONE) 25 MG tablet Take 1 tablet (25 mg total) by mouth daily. 02/01/21   Swayze, Ava, DO  torsemide 40 MG TABS Take 40 mg by mouth 2 (two) times daily. 01/31/21   Swayze, Ava, DO    Family History Family History  Problem Relation Age of Onset   Breast cancer Mother        62's   Breast cancer Maternal Grandmother    Breast cancer Paternal Grandmother     Social History Social History   Tobacco Use   Smoking status: Never   Smokeless tobacco: Never  Vaping Use   Vaping Use: Never used  Substance Use Topics   Alcohol use: No   Drug use: No     Allergies   Ropinirole, Azelastine, Bupropion, Calcium, Carbidopa-levodopa, Cefuroxime, Cephalexin, Clinoril [sulindac], Codeine, Duloxetine, Duloxetine hcl, Ezetimibe, Fenofibrate, Furosemide, Glipizide, Lorazepam, Lortab [hydrocodone-acetaminophen], Metaproterenol, Morphine and related, Nalbuphine, Naproxen, Norfloxacin, Rofecoxib, Sodium, Statins, Sulfa antibiotics, Sulfasalazine, Tramadol, Trazodone and nefazodone, Doxycycline, Iodine, Ketoprofen,  Piroxicam, and Tolmetin   Review of Systems Review of Systems  Constitutional:  Negative for fatigue and fever.  Musculoskeletal:  Positive for back pain (chronic) and joint swelling.  Skin:  Positive for color change and rash.  Neurological:  Negative for weakness and numbness.     Physical Exam Triage Vital Signs ED Triage Vitals  Enc Vitals Group     BP      Pulse      Resp      Temp      Temp src      SpO2      Weight      Height      Head Circumference      Peak Flow      Pain Score      Pain Loc      Pain Edu?      Excl. in [place]?    No data found.  Updated Vital Signs BP (!) 133/59 (BP Location: Left Arm)   Pulse 75   Temp 98.8 F (37.1 C) (Oral)   Resp 14   Ht '5\' 3"'$  (1.6 m)   Wt 169 lb 15.6 oz (77.1 kg)   SpO2 98%   BMI 30.11 kg/m      Physical Exam Vitals and nursing note reviewed.  Constitutional:      General: She is not in acute distress.    Appearance: Normal appearance. She is not ill-appearing or toxic-appearing.  HENT:     Head: Normocephalic and atraumatic.     Nose: Nose normal.     Mouth/Throat:     Mouth: Mucous membranes are moist.     Pharynx: Oropharynx is clear.  Eyes:     General: No scleral icterus.       Right eye: No discharge.        Left eye: No discharge.     Conjunctiva/sclera: Conjunctivae normal.  Cardiovascular:     Rate and Rhythm: Normal rate and regular rhythm.     Heart sounds: Normal heart sounds.  Pulmonary:     Effort: Pulmonary effort is normal. No respiratory distress.     Breath sounds: Normal breath sounds.  Musculoskeletal:     Cervical back: Neck supple.  Skin:    General: Skin is dry.     Findings:  Rash (petechial rash of left lower leg) present.  Neurological:     General: No focal deficit present.     Mental Status: She is alert. Mental status is at baseline.     Motor: No weakness.     Gait: Gait normal.  Psychiatric:        Mood and Affect: Mood normal.        Behavior: Behavior normal.         Thought Content: Thought content normal.       UC Treatments / Results  Labs (all labs ordered are listed, but only abnormal results are displayed) Labs Reviewed - No data to display  EKG   Radiology No results found.  MRI SPINE 08/29/2021 IMPRESSION: 1. L4-L5 mild spinal canal stenosis, with moderate to severe left and mild to moderate right neural foraminal narrowing. Effacement of the lateral recesses at this level likely compresses the descending L5 nerve roots. 2. L3-L4 and L5-S1 moderate left and mild right neural foraminal narrowing. 3. Narrowing of the lateral recesses at L2-L3 and L3-L4 and the left lateral recess at L5-S1 could affect the descending bilateral L3 and L4 nerve roots and left S1 nerve roots, respectively.   Procedures Procedures (including critical care time)  Medications Ordered in UC Medications - No data to display  Initial Impression / Assessment and Plan / UC Course  I have reviewed the triage vital signs and the nursing notes.  Pertinent labs & imaging results that were available during my care of the patient were reviewed by me and considered in my medical decision making (see chart for details).  81 year old female presenting for petechial rash of left lower extremity for the past couple of days.  Patient reports severe "diabetic itch."  She reports that she scratches constantly.  On her exam she does have petechial rash and there are linear excoriations.  Mild swelling around the rash.  Mild tenderness to palpation.  No rash anywhere else.  Vitals are normal and stable.  Patient is already taking Augmentin for UTI.  History of DVT and taking Xarelto.  Has not missed any doses.  Image included in the chart of patient's petechial rash.  Rash is likely secondary to all the scratching she has been doing.  Advised her not to scratch.  We will try hydroxyzine instead of Benadryl and see if that helps her more.  Advised to elevate extremities to  help with the swelling.  Advised compression hose but she says she does not like to use those.  Advised Tylenol for discomfort and pain.  Advise making appointment with primary care provider for follow-up especially if the rash is worsening or not improving.  Advised if she has increased swelling or pain she may need ultrasound of the lower extremity to rule out DVT but she is very low risk being that she is already on anticoagulants.   Final Clinical Impressions(s) / UC Diagnoses   Final diagnoses:  Petechial rash  Pruritus     Discharge Instructions      -Your rash is a petechial rash.  It is likely secondary to all the scratching you are doing.  I have sent a different medication to help with your itching.  Try not to scratch. - Elevate your extremities to help with the swelling. - Continue the Augmentin for your UTI.  This medication will help treat if there is any cellulitis or skin infection although it looks to be most consistent with tiny broken blood vessels.  The  other concern would be a DVT but as discussed, you have about a 2% risk of having a DVT while being on the Xarelto.  If the swelling worsens or you have increased redness or worsening rash, you should follow-up with your primary care provider or go to the ED for ultrasound.     ED Prescriptions     Medication Sig Dispense Auth. Provider   hydrOXYzine (ATARAX) 25 MG tablet  (Status: Discontinued) Take 1 tablet (25 mg total) by mouth every 6 (six) hours as needed for up to 10 days for itching. 30 tablet Laurene Footman B, PA-C   hydrOXYzine (ATARAX) 25 MG tablet Take 1 tablet (25 mg total) by mouth every 6 (six) hours as needed for up to 10 days for itching. 30 tablet Gretta Cool      PDMP not reviewed this encounter.   Danton Clap, PA-C 10/27/21 1458

## 2021-11-15 ENCOUNTER — Other Ambulatory Visit
Admission: RE | Admit: 2021-11-15 | Discharge: 2021-11-15 | Disposition: A | Discharge status: Home / Self Care | Payer: Medicare Other | Source: Ambulatory Visit | Attending: Internal Medicine | Admitting: Internal Medicine

## 2021-11-15 DIAGNOSIS — R600 Localized edema: Secondary | ICD-10-CM | POA: Insufficient documentation

## 2021-11-15 LAB — D-DIMER, QUANTITATIVE: D-Dimer, Quant: 0.49 ug/mL-FEU (ref 0.00–0.50)

## 2021-11-21 ENCOUNTER — Other Ambulatory Visit: Payer: Self-pay | Admitting: General Surgery

## 2021-11-21 DIAGNOSIS — K432 Incisional hernia without obstruction or gangrene: Secondary | ICD-10-CM

## 2021-11-21 DIAGNOSIS — R1032 Left lower quadrant pain: Secondary | ICD-10-CM

## 2021-11-28 ENCOUNTER — Ambulatory Visit
Admission: RE | Admit: 2021-11-28 | Discharge: 2021-11-28 | Disposition: A | Discharge status: Home / Self Care | Payer: Medicare Other | Source: Ambulatory Visit | Attending: General Surgery | Admitting: General Surgery

## 2021-11-28 DIAGNOSIS — K432 Incisional hernia without obstruction or gangrene: Secondary | ICD-10-CM | POA: Insufficient documentation

## 2021-11-28 DIAGNOSIS — R1032 Left lower quadrant pain: Secondary | ICD-10-CM | POA: Diagnosis present

## 2021-12-07 ENCOUNTER — Ambulatory Visit
Admission: EM | Admit: 2021-12-07 | Discharge: 2021-12-07 | Disposition: A | Discharge status: Home / Self Care | Payer: Medicare Other | Attending: Family Medicine | Admitting: Family Medicine

## 2021-12-07 DIAGNOSIS — Z794 Long term (current) use of insulin: Secondary | ICD-10-CM | POA: Diagnosis not present

## 2021-12-07 DIAGNOSIS — K922 Gastrointestinal hemorrhage, unspecified: Secondary | ICD-10-CM | POA: Diagnosis present

## 2021-12-07 DIAGNOSIS — Z7901 Long term (current) use of anticoagulants: Secondary | ICD-10-CM | POA: Diagnosis not present

## 2021-12-07 LAB — URINALYSIS, ROUTINE W REFLEX MICROSCOPIC
Bilirubin Urine: NEGATIVE
Glucose, UA: NEGATIVE mg/dL
Ketones, ur: NEGATIVE mg/dL
Leukocytes,Ua: NEGATIVE
Nitrite: NEGATIVE
Protein, ur: NEGATIVE mg/dL
Specific Gravity, Urine: 1.015 (ref 1.005–1.030)
pH: 5.5 (ref 5.0–8.0)

## 2021-12-07 LAB — CBC WITH DIFFERENTIAL/PLATELET
Abs Immature Granulocytes: 0.03 10*3/uL (ref 0.00–0.07)
Basophils Absolute: 0.1 10*3/uL (ref 0.0–0.1)
Basophils Relative: 0 %
Eosinophils Absolute: 0.2 10*3/uL (ref 0.0–0.5)
Eosinophils Relative: 1 %
HCT: 27.9 % — ABNORMAL LOW (ref 36.0–46.0)
Hemoglobin: 8.9 g/dL — ABNORMAL LOW (ref 12.0–15.0)
Immature Granulocytes: 0 %
Lymphocytes Relative: 66 %
Lymphs Abs: 10.6 10*3/uL — ABNORMAL HIGH (ref 0.7–4.0)
MCH: 29.4 pg (ref 26.0–34.0)
MCHC: 31.9 g/dL (ref 30.0–36.0)
MCV: 92.1 fL (ref 80.0–100.0)
Monocytes Absolute: 2.6 10*3/uL — ABNORMAL HIGH (ref 0.1–1.0)
Monocytes Relative: 16 %
Neutro Abs: 2.7 10*3/uL (ref 1.7–7.7)
Neutrophils Relative %: 17 %
Platelets: 95 10*3/uL — ABNORMAL LOW (ref 150–400)
RBC: 3.03 MIL/uL — ABNORMAL LOW (ref 3.87–5.11)
RDW: 17.7 % — ABNORMAL HIGH (ref 11.5–15.5)
WBC: 16.1 10*3/uL — ABNORMAL HIGH (ref 4.0–10.5)
nRBC: 0 % (ref 0.0–0.2)

## 2021-12-07 LAB — URINALYSIS, MICROSCOPIC (REFLEX)

## 2021-12-07 LAB — GLUCOSE, CAPILLARY: Glucose-Capillary: 105 mg/dL — ABNORMAL HIGH (ref 70–99)

## 2021-12-07 LAB — COMPREHENSIVE METABOLIC PANEL
ALT: 20 U/L (ref 0–44)
AST: 33 U/L (ref 15–41)
Albumin: 3.5 g/dL (ref 3.5–5.0)
Alkaline Phosphatase: 70 U/L (ref 38–126)
Anion gap: 8 (ref 5–15)
BUN: 36 mg/dL — ABNORMAL HIGH (ref 8–23)
CO2: 26 mmol/L (ref 22–32)
Calcium: 8.5 mg/dL — ABNORMAL LOW (ref 8.9–10.3)
Chloride: 103 mmol/L (ref 98–111)
Creatinine, Ser: 1.04 mg/dL — ABNORMAL HIGH (ref 0.44–1.00)
GFR, Estimated: 55 mL/min — ABNORMAL LOW (ref >60)
Glucose, Bld: 104 mg/dL — ABNORMAL HIGH (ref 70–99)
Potassium: 3.5 mmol/L (ref 3.5–5.1)
Sodium: 137 mmol/L (ref 135–145)
Total Bilirubin: 0.5 mg/dL (ref 0.3–1.2)
Total Protein: 6.4 g/dL — ABNORMAL LOW (ref 6.5–8.1)

## 2021-12-07 LAB — OCCULT BLOOD X 1 CARD TO LAB, STOOL: Fecal Occult Bld: POSITIVE — AB

## 2021-12-07 LAB — LIPASE, BLOOD: Lipase: 30 U/L (ref 11–51)

## 2021-12-07 NOTE — ED Provider Notes (Signed)
[facility]    CSN: [id number] Arrival date & time: 12/07/21  1657      History   Chief Complaint Chief Complaint  Patient presents with   Diarrhea   Weakness    HPI Gina Chambers is a 81 y.o. female.   HPI  Gina Chambers brought in by her son for ongoing diarrhea.  Patient had a procedure completed last Tuesday and she had to drink a lot of prep.  Since then she has been having watery diarrhea with a foul odor.  She reports that her stool is dark brown but does not appear black or bright red.  She has 4-6 watery dark brown stools daily which she states is better than it was before.  She feels weak and very tired.  She has an abdominal pain described as cramping.  She has not had any fever or chills, shortness of breath or chest discomfort.  Denies sore throat, respiratory symptoms, cough, trouble urinating.  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 She takes her own medicine and denies taking a double dose or missing a dose.  She has history of a GI bleed and a hernia and recently got a CT of her belly last week.  Has been taking her insulin.  States that her last A1c was 4.2.  Blood sugars usually are very well controlled.    Past Medical History:  Diagnosis Date   Asthma    Asthmatic bronchitis , chronic (HCC)    CHF (congestive heart failure) ([place])    Diabetes mellitus without complication ([place])    DVT (deep venous thrombosis) (HCC)    x3 - both legs. last one approx 2013   Family history of adverse reaction to anesthesia    Mother - temporary paralysis of 1 side   GERD (gastroesophageal reflux disease)    Hypertension    Neuropathy    feet   PONV (postoperative nausea and vomiting)    Pt reports violent vomiting with ANY pain meds given with anesthesia.   Sleep apnea    CPAP   Thyroid disease    Vertigo    daily    Patient Active Problem List   Diagnosis Date Noted   Paroxysmal atrial flutter ([place]) 01/20/2021   Acute cholecystitis 01/15/2021    Right shoulder pain 01/15/2021   COPD (chronic obstructive pulmonary disease) (HCC)    DVT (deep venous thrombosis) (HCC)    OSA on CPAP    Chronic diastolic CHF (congestive heart failure) ([place])    Sepsis ([place])    Inguinal abscess    Anticoagulated    COVID-19 virus infection 09/24/2020   Type 2 diabetes mellitus without complication ([place]) 67/61/9509   Hyponatremia 09/24/2020   Cholecystitis 09/23/2020   Chest pain 12/24/2018   Hypoglycemia 11/07/2017   GIB (gastrointestinal bleeding) 11/07/2016   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) ([place]) 06/19/2016   COPD exacerbation ([place]) 05/12/2016   Community acquired pneumonia 05/12/2016   Leukocytosis 05/12/2016   Generalized weakness 05/12/2016   Acquired hypothyroidism 12/15/2015   Essential hypertension 12/15/2015    Past Surgical History:  Procedure Laterality Date   CARDIAC SURGERY     CATARACT EXTRACTION W/PHACO Right 03/11/2017   Procedure: CATARACT EXTRACTION PHACO AND INTRAOCULAR LENS PLACEMENT ([place])  RIGHT DIABETIC;  Surgeon: Eulogio Bear, MD;  Location: [facility];  Service: Ophthalmology;  Laterality: Right;  Diabetic - insulin sleep apnea   CATARACT EXTRACTION W/PHACO Left 04/02/2017   Procedure: CATARACT  EXTRACTION PHACO AND INTRAOCULAR LENS PLACEMENT (IOC) LEFT DIABETES;  Surgeon: Eulogio Bear, MD;  Location: [place];  Service: Ophthalmology;  Laterality: Left;  Diabetic - insulin   COLONOSCOPY WITH PROPOFOL N/A 11/09/2016   Procedure: COLONOSCOPY WITH PROPOFOL;  Surgeon: Jonathon Bellows, MD;  Location: [facility];  Service: Gastroenterology;  Laterality: N/A;   HERNIA REPAIR     IR CHOLANGIOGRAM EXISTING TUBE  12/06/2020   IR PERC CHOLECYSTOSTOMY  01/16/2021   IR RADIOLOGIST EVAL & MGMT  11/23/2020   VERTICAL BANDED GASTROPLASTY      OB History   No obstetric history on file.      Home Medications    Prior to Admission medications   Medication Sig Start  Date End Date Taking? Authorizing Provider  aspirin EC 81 MG tablet Take 81 mg by mouth at bedtime.    [provider]  bumetanide (BUMEX) 0.5 MG tablet Take 1 mg by mouth 2 (two) times daily.    [provider]  clobetasol cream (TEMOVATE) 4.25 % Apply 1 application topically 2 (two) times daily.    [provider]  diltiazem (CARDIZEM CD) 180 MG 24 hr capsule Take 1 capsule (180 mg total) by mouth 2 (two) times daily. 01/31/21   Swayze, Ava, DO  fluticasone (FLOVENT HFA) 110 MCG/ACT inhaler Inhale 2 puffs into the lungs 2 (two) times daily.    [provider]  gabapentin (NEURONTIN) 100 MG capsule Take 1 capsule (100 mg total) by mouth 2 (two) times daily. 01/31/21   Swayze, Ava, DO  gabapentin (NEURONTIN) 100 MG capsule Take 2 capsules (200 mg total) by mouth at bedtime. 01/31/21   Swayze, Ava, DO  ibuprofen (ADVIL) 600 MG tablet Take 1 tablet (600 mg total) by mouth every 6 (six) hours as needed for headache or moderate pain (Severe pain). 01/31/21   Swayze, Ava, DO  insulin glargine (LANTUS) 100 UNIT/ML injection Inject 10-20 Units into the skin See admin instructions. Inject 20u under the skin every morning and inject 10u under the skin ever night at bedtime    [provider]  insulin regular (NOVOLIN R,HUMULIN R) 250 units/2.6m (100 units/mL) injection Inject 10 Units into the skin 3 (three) times daily before meals.    [provider]  Ipratropium-Albuterol (COMBIVENT) 20-100 MCG/ACT AERS respimat Inhale 1 puff into the lungs 4 (four) times daily as needed for wheezing or shortness of breath.    [provider]  lansoprazole (PREVACID) 30 MG capsule Take 30 mg by mouth 2 (two) times daily.    [provider]  levalbuterol ([place] 0.63 MG/3ML nebulizer solution Inhale 0.63 mg into the lungs every 6 (six) hours as needed. 04/27/19   [provider]  levothyroxine (SYNTHROID, LEVOTHROID) 125 MCG tablet Take 125 mcg  by mouth daily.    [provider]  metoprolol succinate (TOPROL-XL) 50 MG 24 hr tablet Take 50 mg by mouth daily. Take with or immediately following a meal.    [provider]  mirabegron ER (MYRBETRIQ) 25 MG TB24 tablet Take 25 mg by mouth daily.    [provider]  montelukast (SINGULAIR) 10 MG tablet Take 10 mg by mouth at bedtime.    [provider]  Multiple Vitamins-Minerals (CENTRUM SILVER PO) Take 1 tablet by mouth daily.    [provider]  Multiple Vitamins-Minerals (PRESERVISION AREDS 2 PO) Take 1 tablet by mouth 2 (two) times daily.    [provider]  rivaroxaban (XARELTO) 20 MG TABS  tablet Take 20 mg by mouth daily.    [provider]  spironolactone (ALDACTONE) 25 MG tablet Take 1 tablet (25 mg total) by mouth daily. 02/01/21   Swayze, Ava, DO  torsemide 40 MG TABS Take 40 mg by mouth 2 (two) times daily. 01/31/21   Swayze, Ava, DO    Family History Family History  Problem Relation Age of Onset   Breast cancer Mother        25's   Breast cancer Maternal Grandmother    Breast cancer Paternal Grandmother     Social History Social History   Tobacco Use   Smoking status: Never   Smokeless tobacco: Never  Vaping Use   Vaping Use: Never used  Substance Use Topics   Alcohol use: No   Drug use: No     Allergies   Ropinirole, Azelastine, Bupropion, Calcium, Carbidopa-levodopa, Cefuroxime, Cephalexin, Clinoril [sulindac], Codeine, Duloxetine, Duloxetine hcl, Ezetimibe, Fenofibrate, Furosemide, Glipizide, Lorazepam, Lortab [hydrocodone-acetaminophen], Metaproterenol, Morphine and related, Nalbuphine, Naproxen, Norfloxacin, Rofecoxib, Sodium, Statins, Sulfa antibiotics, Sulfasalazine, Tramadol, Trazodone and nefazodone, Doxycycline, Iodine, Ketoprofen, Piroxicam, and Tolmetin   Review of Systems Review of Systems : :negative unless otherwise stated in HPI.      Physical Exam Triage Vital Signs ED Triage  Vitals  Enc Vitals Group     BP 12/07/21 1736 123/62     Pulse Rate 12/07/21 1736 76     Resp --      Temp 12/07/21 1736 98.3 F (36.8 C)     Temp Source 12/07/21 1736 Oral     SpO2 12/07/21 1736 100 %     Weight 12/07/21 1735 165 lb (74.8 kg)     Height 12/07/21 1735 '5\' 2"'$  (1.575 m)     Head Circumference --      Peak Flow --      Pain Score 12/07/21 1734 0     Pain Loc --      Pain Edu? --      Excl. in [place]? --    No data found.  Updated Vital Signs BP 123/62 (BP Location: Left Arm)   Pulse 76   Temp 98.3 F (36.8 C) (Oral)   Ht '5\' 2"'$  (1.575 m)   Wt 74.8 kg   SpO2 100%   BMI 30.18 kg/m   Visual Acuity Right Eye Distance:   Left Eye Distance:   Bilateral Distance:    Right Eye Near:   Left Eye Near:    Bilateral Near:     Physical Exam  GEN: ill-appearing elderly female CV: regular rate and rhythm RESP: no increased work of breathing, clear to ascultation bilaterally ABD: Bowel sounds present. Soft, non-tender, procedural bandage clean dry and intact MSK: +LLE edema (at baseline per pt), unsteady gait SKIN: pallor, warm, dry NEURO: alert, oriented  PSYCH: Normal affect, appropriate speech and behavior   UC Treatments / Results  Labs (all labs ordered are listed, but only abnormal results are displayed) Labs Reviewed  URINALYSIS, ROUTINE W REFLEX MICROSCOPIC - Abnormal; Notable for the following components:      Result Value   Hgb urine dipstick TRACE (*)    All other components within normal limits  GLUCOSE, CAPILLARY - Abnormal; Notable for the following components:   Glucose-Capillary 105 (*)    All other components within normal limits  URINALYSIS, MICROSCOPIC (REFLEX) - Abnormal; Notable for the following components:   Bacteria, UA FEW (*)    All other components within normal limits  CBC WITH DIFFERENTIAL/PLATELET  COMPREHENSIVE METABOLIC PANEL  LIPASE, BLOOD  CBG MONITORING, ED    EKG   Radiology No results  found.  Procedures Procedures (including critical care time)  Medications Ordered in UC Medications - No data to display  Initial Impression / Assessment and Plan / UC Course  I have reviewed the triage vital signs and the nursing notes.  Pertinent labs & imaging results that were available during my care of the patient were reviewed by me and considered in my medical decision making (see chart for details).     Patient is a 81 year old female with history of GI bleed, diabetes, COPD, hypertension, DVT on Xarelto and diastolic heart failure who presents for ongoing diarrhea.  CT abdomen pelvis reviewed from 11/28/2021 which showed a left anterior lateral abdominal hernia.  CBC, CMP, UA and lipase ordered.  I was asked to see the patient in the bathroom as patient had a blowout.  RN reports that the patient had very dark stool that was malodorous.  Upon arrival to the bathroom, patient holding onto the RN.  There was dark brown stool scattered.  CBC showed anemia with a 1-2 point drop in her hemoglobin (10.8>8.9) and was thrombocytopenic compared from the labs performed at [facility].   Fecal Hemoccult was positive.  Given her worsening anemia and positive fecal occult discussed with patient and son-in-law about need for ED evaluation for GI bleed.  Son-in-law requested patient be sent instead to [facility].  Unfortunately, EMS system will not abide by patient's preference in acute situation.  Risks discussed of traveling by private vehicle however given that her gastroenterologist preferred [facility] pt wanted to travel to this location. Son-in-law will drive patient by private vehicle.      Final Clinical Impressions(s) / UC Diagnoses   Final diagnoses:  Gastrointestinal hemorrhage, unspecified gastrointestinal hemorrhage type     Discharge Instructions      Your stool has some blood in it.   You have been advised to follow up immediately in the emergency  department for concerning signs or symptoms as discussed during your visit. If you declined EMS transport, please have a family member take you directly to the ED at this time. Do not delay.   Based on concerns about condition, if you do not follow up in the ED, you may risk poor outcomes including worsening of condition, delayed treatment and potentially life threatening issues. If you have declined to go to the ED at this time, you should call your PCP immediately to set up a follow up appointment.      ED Prescriptions   None    PDMP not reviewed this encounter.   Lyndee Hensen, DO 12/08/21 1445

## 2021-12-07 NOTE — Discharge Instructions (Addendum)
Your stool has some blood in it.   You have been advised to follow up immediately in the emergency department for concerning signs or symptoms as discussed during your visit. If you declined EMS transport, please have a family member take you directly to the ED at this time. Do not delay.   Based on concerns about condition, if you do not follow up in the ED, you may risk poor outcomes including worsening of condition, delayed treatment and potentially life threatening issues. If you have declined to go to the ED at this time, you should call your PCP immediately to set up a follow up appointment.

## 2021-12-07 NOTE — ED Triage Notes (Signed)
Patient reports having a CT scan done last Tuesday and she had has diarrhea since then.   Patient reports that her diarrhea is dark in color and had had a lot of gas., and diarrhea has a very foul odor.  Patient is very weak.

## 2021-12-07 NOTE — ED Notes (Signed)
Per Dr. Susa Simmonds call [phone] for patient due to decreased Hemoglobin.   Patient refused EMS.   Patients son in law will be taking her to [place].

## 2021-12-07 NOTE — ED Notes (Signed)
Patient is being discharged from the Urgent Care and sent to the Emergency Department via POV . Per Dr. Susa Simmonds, patient is in need of higher level of care due to Decreased hemoglobin & blood in stool. Patient is aware and verbalizes understanding of plan of care.   Son in law reports that he will be taking her to [place]:   12/07/21 1736  BP: 123/62  Pulse: 76  Temp: 98.3 F (36.8 C)  SpO2: 100%

## 2021-12-08 LAB — PATHOLOGIST SMEAR REVIEW

## 2022-01-09 ENCOUNTER — Other Ambulatory Visit: Payer: Medicare Other | Admitting: Primary Care

## 2022-01-09 DIAGNOSIS — E119 Type 2 diabetes mellitus without complications: Secondary | ICD-10-CM

## 2022-01-09 DIAGNOSIS — R531 Weakness: Secondary | ICD-10-CM

## 2022-01-09 DIAGNOSIS — D72829 Elevated white blood cell count, unspecified: Secondary | ICD-10-CM

## 2022-01-09 DIAGNOSIS — Z515 Encounter for palliative care: Secondary | ICD-10-CM

## 2022-01-09 NOTE — Progress Notes (Signed)
Gina Chambers Consult Note Telephone: [phone]  Fax: [phone]    Date of encounter: 01/09/22 12:41 PM PATIENT NAME: Gina Chambers [address] Gina Chambers [place] [address]   [phone] (home)  DOB: [birth year] MRN: [id number] PRIMARY CARE PROVIDER:    Kirk Ruths, MD,  [place] [address] [phone]  REFERRING PROVIDER:   Kirk Ruths, MD [facility] [place],  [place] [address] [phone]  RESPONSIBLE PARTY:    Contact Information     Name Relation Home Work Mobile   Gina Chambers Daughter [phone]     Gina Chambers   [phone]        I met face to face with patient and family in  home. Palliative Care was asked to follow this patient by consultation request of  Gina Ruths, MD to address advance care planning and complex medical decision making. This is a follow up visit.                                   ASSESSMENT AND PLAN / RECOMMENDATIONS:   Advance Care Planning/Goals of Care: Goals include to maximize quality of life and symptom management. Patient/health care surrogate gave his/her permission to discuss.Our advance care planning conversation included a discussion about:    The value and importance of advance care planning  Experiences with loved ones who have been seriously ill or have died  Exploration of personal, cultural or spiritual beliefs that might influence medical decisions  Exploration of goals of care in the event of a sudden injury or illness  Identification of a healthcare agent - daughter Review  of an  advance directive document MOST given, will review CODE STATUS: dnr  Symptom Management/Plan:  Has had recent GI Bleed. 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  Was found to have ulcer in colon. Has had several infections with elevated white cells. Will be worked up at  [facility]. Has painful L sided hernia, and may have surgery on this. Voices discouragement at inability to feel better, over 2 years of poor health. Voices she will likely treat all these issues as she feels her time here is not done.  Has good family support as she is living with daughter and son in law. Discussed current health challenges.  Follow up Palliative Care Visit: Palliative care will continue to follow for complex medical decision making, advance care planning, and clarification of goals. Return 68 weeks or prn.  I spent 60 minutes providing this consultation. More than 50% of the time in this consultation was spent in counseling and care coordination.  PPS: 50%  HOSPICE ELIGIBILITY/DIAGNOSIS: TBD  Chief Complaint: fatigue, leucocytosis  HISTORY OF PRESENT ILLNESS:  Gina Chambers is a 81 y.o. year old female  with DM, chronic infections, debility. Patient seen today to review palliative care needs to include medical decision making and advance care planning as appropriate.   History obtained from review of EMR, discussion with primary team, and interview with family, facility staff/caregiver and/or [name].  I reviewed available labs, medications, imaging, studies and related documents from the EMR.  Records reviewed and summarized above.   ROS   General: NAD EYES: denies vision changes ENMT: denies dysphagia Cardiovascular: denies chest pain, denies DOE Pulmonary: denies cough, denies increased SOB Abdomen: endorses fair  appetite, endorses occ  constipation, endorses continence of bowel, endorses enlarging L abdomen hernia GU: denies dysuria, endorses continence of urine MSK:  endorses  increased weakness, no falls reported Skin: denies rashes or wounds Neurological: endorses abdominal pain,endorses occ  insomnia Psych: Endorses positive mood  Physical Exam: Current and past weights: 167 lbs Constitutional: NAD General: frail appearing, EYES: anicteric sclera, lids  intact, no discharge  ENMT: intact hearing, oral mucous membranes moist, dentition intact CV: slight bil LE edema Pulmonary: no increased work of breathing, no cough, room air Abdomen: intake 50%, no ascites, large hernia L uq and lq. MSK: mild sarcopenia, moves all extremities, ambulatory with walker  Skin: warm and dry, no rashes or wounds on visible skin Neuro:  +  generalized weakness,  no cognitive impairment, slight anxious affect   Thank you for the opportunity to participate in the care of [name].  The palliative care team will continue to follow. Please call our office at [phone] if we can be of additional assistance.   Jason Coop, NP DNP, AGPCNP-BC  COVID-19 PATIENT SCREENING TOOL Asked and negative response unless otherwise noted:   Have you had symptoms of covid, tested positive or been in contact with someone with symptoms/positive test in the past 5-10 days?

## 2022-03-06 ENCOUNTER — Other Ambulatory Visit: Payer: Medicare Other | Admitting: Primary Care

## 2022-03-06 DIAGNOSIS — R531 Weakness: Secondary | ICD-10-CM

## 2022-03-06 DIAGNOSIS — Z515 Encounter for palliative care: Secondary | ICD-10-CM

## 2022-03-06 DIAGNOSIS — N390 Urinary tract infection, site not specified: Secondary | ICD-10-CM

## 2022-03-06 NOTE — Progress Notes (Signed)
[facility] Consult Note Telephone: [phone]  Fax: [phone]    Date of encounter: 03/06/22 1:01 PM PATIENT NAME: Gina Chambers [address] Gina Chambers [place] [address]   [phone] (home)  DOB: 1977/05/14 MRN: [id number] PRIMARY CARE PROVIDER:    Kirk Ruths, MD,  [place] [address] [phone]  REFERRING PROVIDER:   Kirk Ruths, MD [facility] [place],  [place] [address] [phone]  RESPONSIBLE PARTY:    Contact Information     Name Relation Home Work Mobile   Gina Chambers Daughter [phone]     Gina Chambers   [phone]      I met face to face with patient in her  home. Palliative Care was asked to follow this patient by consultation request of  Gina Ruths, MD to address advance care planning and complex medical decision making. This is a follow up visit.                                   ASSESSMENT AND PLAN / RECOMMENDATIONS:   Advance Care Planning/Goals of Care: Goals include to maximize quality of life and symptom management. Patient/health care surrogate gave his/her permission to discuss. Our advance care planning conversation included a discussion about:    The value and importance of advance care planning  Experiences with loved ones who have been seriously ill or have died  Exploration of personal, cultural or spiritual beliefs that might influence medical decisions  Exploration of goals of care in the event of a sudden injury or illness  Identification of a healthcare agent - daughter CODE STATUS: DNR  Symptom Management/Plan:  New dx CLL:  Will treat once chronic uti is resolved.  UTI: Has had 6 in 1 year .Marland Kitchen Being Rx with augmentin 500/ 125  mg  But is expecting another med in addition, fosfomycin. Nutrition; states eating good breakfast, other meals are hit or miss.  Mobility: (I)  but losing stamina.  I met with patient in her home. 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 She will follow up with treatment once her chronic UTI is resolved. We reviewed her recent notes and protocols. She's currently on amoxicillin 500-125 TID and is to start fosfomysin, as soon as her daughter can pick up the prescription. I placed a call to ID at [facility] to see if she is to continue the Augmentin or discontinue.  Message left. She endorses feeling fatigued and discouraged at her chronic illnesses. She states her stamina has reduced dramatically, but that her anemia is fairly well-controlled.  She's definitely ambulating with some difficulty but is still independent. She endorses a good intake and appetite and denies any new or increased pain. We discuss the palliative program stopping nurse practitioner visits. There will be an Therapist, sports and Education officer, museum who can continue to monitor.   Follow up Palliative Care Visit: Palliative care  RN will continue to follow for complex medical decision making, advance care planning, and clarification of goals. Return 6 weeks or prn.  I spent 60 minutes providing this consultation. More than 50% of the time in this consultation was spent in counseling and care coordination.  PPS: 40%  HOSPICE ELIGIBILITY/DIAGNOSIS: TBD  Chief Complaint: CLL, Uti  HISTORY OF PRESENT ILLNESS:  Gina Chambers is a 81  y.o. year old female  with CLL, UTI,debility .   History obtained from review of EMR, discussion with primary team, and interview with family, facility staff/caregiver and/or Gina Chambers.  I reviewed available labs, medications, imaging, studies and related documents from the EMR.  Records reviewed and summarized above.   ROS   General: NAD EYES: denies vision changes ENMT: denies dysphagia Cardiovascular: denies chest pain, denies DOE Pulmonary: denies cough, denies increased SOB Abdomen: endorses good appetite, denies constipation, endorses  continence of bowel, endorses large hernia GU: denies dysuria, endorses continence of urine MSK: endorses increased weakness,  no falls reported Skin: denies rashes or wounds Neurological: denies pain, denies insomnia Psych: Endorses positive mood Heme/lymph/immuno: denies bruises, abnormal bleeding  Physical Exam: Current and past weights: stable Constitutional: NAD General: frail appearing EYES: anicteric sclera, lids intact, no discharge  ENMT: intact hearing, oral mucous membranes moist, dentition intact CV: 2+ L  LE edema Pulmonary: no increased work of breathing, no cough, room air Abdomen: intake 70%,soft and non tender,  large hernia, no ascites GU: deferred MSK: + sarcopenia, moves all extremities, ambulatory Skin: warm and dry, no rashes or wounds on visible skin Neuro:  + generalized weakness,  no cognitive impairment Psych: slight anxious affect, A and O x 3 Hem/lymph/immuno: no widespread bruising   Thank you for the opportunity to participate in the care of Gina Chambers. Please call our office at [phone] if we can be of additional assistance.   Gina Coop, NP   COVID-19 PATIENT SCREENING TOOL Asked and negative response unless otherwise noted:   Have you had symptoms of covid, tested positive or been in contact with someone with symptoms/positive test in the past 5-10 days?

## 2022-05-22 ENCOUNTER — Other Ambulatory Visit: Payer: Medicare Other

## 2022-05-22 DIAGNOSIS — Z515 Encounter for palliative care: Secondary | ICD-10-CM

## 2022-05-22 NOTE — Progress Notes (Signed)
COMMUNITY PALLIATIVE CARE SW NOTE  PATIENT NAME: Gina Chambers DOB: [birth year] MRN: [id number]  PRIMARY CARE PROVIDER: Kirk Ruths, MD  RESPONSIBLE PARTY:  Acct ID - Guarantor Home Phone Work Phone Relationship Acct Type  [id number] Gina Chambers, [phone]  Self P/F     [address], [place], [place] [address]                                              TELEPHONE ENCOUNTER  PC SW connected with patient via telephone to complete telephonic check in and schedule in person visit.   Patient share that she has an upcoming surgery of her spline and hernia and request to hold off on scheduling a home visit until after the surgery. Patient request a call back the second week of march.  Leukemia:  Currently receiving treatments. No issues reported. Nutrition; states eating good breakfast, other meals are hit or miss.   Plan: palliative care will continue to follow. Per patient request PC to outreach again the second week of march.       SOCIAL HX:  Social History   Tobacco Use   Smoking status: Never   Smokeless tobacco: Never  Substance Use Topics   Alcohol use: No    CODE STATUS: DNR  ADVANCED DIRECTIVES: Y - DAUGHTER MOST FORM COMPLETE:  N HOSPICE EDUCATION PROVIDED: N  Time spent: 20 min      S[place]ia Frazer, [place]

## 2022-08-03 ENCOUNTER — Other Ambulatory Visit: Payer: Medicare Other

## 2022-08-07 NOTE — Progress Notes (Signed)
TELEPHONE ENCOUNTER   Palliative care SW outreached patient to complete telephonic check in.   Patient shared she did not have surgery of spline or hernia like she had planned due to having second thoughts about it after speaking with her doctors and surgeons again. Patient receiving radiology.Patient scheduled for emergency MRI of spine on wed. Patient expressed concern and worry of MRI results. Patient shared that she has had recurrent UTI'S over the past 2 months. Patient now seeing an allegery specialist to determine if she resistant some antibiotics.  Patient share that she does not have much energy anymore. Appetite: is fair Support: patient resides with her daughter Pain: pinched nerve in left leg, but is unable to receive injection due to low red blood cell count.   Patient and SW discussed new PC layout as she inquired about pain management. SW advised that [facility] does not provide pain management. Patient requested that SW outreach her daughter to discuss new PC criteria and layout further to determine if she should stay on services.  PC SW will outreach patients daughter next week to follow up after patients MRI and discuss ongoing PC support.

## 2022-08-10 ENCOUNTER — Other Ambulatory Visit: Payer: Medicare Other

## 2022-08-10 DIAGNOSIS — Z515 Encounter for palliative care: Secondary | ICD-10-CM

## 2022-08-10 NOTE — Progress Notes (Signed)
TELEPHONE ENCOUNTER  Palliative care SW connected with patients daughter to follow up on patient status and needs.  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 Will have a biopsy done soon, waiting for it to be scheduled. Oncology shared that they are concerned findings is a metacsized area or something else. Patient also has bone marrow transplant scheduled next month. Daughter would like patient to continue to receive PC services.  Pain: patient continues to have pain. Daughter is aware that PC does not provide pain management.  [facility]: involved and planning to send a Charity fundraiser and SW out. previous appt was in Feb 2023. Daughter states that they should have an appt coming up with them next montha dn discuss pain management. Hospice vs PC: daughter is familiar with hospice and pc however she shares that patient is not ready to have EOL discussions. Daughter is open to patient being placed on RN volunteer call list to outreach every few weeks.  Plan: PC to check in monthly via telephone.

## 2022-09-12 ENCOUNTER — Telehealth: Payer: Self-pay

## 2022-09-12 NOTE — Telephone Encounter (Signed)
1423 Palliative Care Note  RN attempted to contact pt's daughter Willaim Rayas for monthly palliative check in call per daughters request. No answer. LVM with contact info and request to return call.   1421 Palliative Care Note  RN attempted to contact pt on home number for palliative check in call. No answer, LVM with contact info and request to return call.    Barbette Merino, RN

## 2022-11-22 DEATH — deceased
